# Patient Record
Sex: Male | Born: 1995
Health system: Southern US, Community
[De-identification: ages and names within clinical notes are randomized; demographics above are authoritative.]

## PROBLEM LIST (undated history)

## (undated) VITALS — BP 112/70 | HR 99 | Temp 97.8°F | Resp 16 | Ht 69.69 in | Wt 135.0 lb

## (undated) DIAGNOSIS — Z91148 Patient's other noncompliance with medication regimen for other reason: Secondary | ICD-10-CM

## (undated) DIAGNOSIS — B009 Herpesviral infection, unspecified: Secondary | ICD-10-CM

## (undated) DIAGNOSIS — J984 Other disorders of lung: Secondary | ICD-10-CM

## (undated) DIAGNOSIS — F909 Attention-deficit hyperactivity disorder, unspecified type: Secondary | ICD-10-CM

## (undated) DIAGNOSIS — E7800 Pure hypercholesterolemia, unspecified: Secondary | ICD-10-CM

## (undated) DIAGNOSIS — I10 Essential (primary) hypertension: Secondary | ICD-10-CM

## (undated) DIAGNOSIS — Z9114 Patient's other noncompliance with medication regimen: Secondary | ICD-10-CM

## (undated) DIAGNOSIS — E109 Type 1 diabetes mellitus without complications: Secondary | ICD-10-CM

## (undated) HISTORY — DX: Other disorders of lung: J98.4

## (undated) HISTORY — PX: OTHER SURGICAL HISTORY: SHX169

---
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
## 2013-02-05 ENCOUNTER — Other Ambulatory Visit: Payer: Self-pay

## 2013-02-05 MED ORDER — PEN NEEDLES 31G X 6 MM MISC: 1.0000 | Freq: Three times a day (TID) | Status: DC

## 2013-02-05 NOTE — Telephone Encounter (Signed)
Last seen 09/05/11  MMM  Last glucose in hospital 10/16/12

## 2013-02-05 NOTE — Telephone Encounter (Signed)
ntbs

## 2013-02-13 ENCOUNTER — Ambulatory Visit: Payer: Medicaid Other | Admitting: General Practice

## 2013-02-13 ENCOUNTER — Telehealth: Payer: Self-pay | Admitting: Nurse Practitioner

## 2013-02-13 NOTE — Telephone Encounter (Signed)
appt scheduled

## 2013-02-20 ENCOUNTER — Ambulatory Visit: Payer: Medicaid Other | Admitting: Nurse Practitioner

## 2013-03-10 ENCOUNTER — Emergency Department (HOSPITAL_COMMUNITY)
Admission: EM | Admit: 2013-03-10 | Discharge: 2013-03-10 | Disposition: A | Discharge status: Home / Self Care | Payer: Medicaid Other | Attending: Emergency Medicine | Admitting: Emergency Medicine

## 2013-03-10 ENCOUNTER — Encounter (HOSPITAL_COMMUNITY): Payer: Self-pay | Admitting: Emergency Medicine

## 2013-03-10 DIAGNOSIS — J020 Streptococcal pharyngitis: Secondary | ICD-10-CM

## 2013-03-10 DIAGNOSIS — R599 Enlarged lymph nodes, unspecified: Secondary | ICD-10-CM | POA: Insufficient documentation

## 2013-03-10 DIAGNOSIS — R3589 Other polyuria: Secondary | ICD-10-CM | POA: Insufficient documentation

## 2013-03-10 DIAGNOSIS — R6883 Chills (without fever): Secondary | ICD-10-CM | POA: Insufficient documentation

## 2013-03-10 DIAGNOSIS — E109 Type 1 diabetes mellitus without complications: Secondary | ICD-10-CM | POA: Insufficient documentation

## 2013-03-10 DIAGNOSIS — F17200 Nicotine dependence, unspecified, uncomplicated: Secondary | ICD-10-CM | POA: Insufficient documentation

## 2013-03-10 DIAGNOSIS — Z794 Long term (current) use of insulin: Secondary | ICD-10-CM | POA: Insufficient documentation

## 2013-03-10 DIAGNOSIS — R000 Tachycardia, unspecified: Secondary | ICD-10-CM | POA: Insufficient documentation

## 2013-03-10 DIAGNOSIS — R739 Hyperglycemia, unspecified: Secondary | ICD-10-CM

## 2013-03-10 DIAGNOSIS — R358 Other polyuria: Secondary | ICD-10-CM | POA: Insufficient documentation

## 2013-03-10 DIAGNOSIS — K089 Disorder of teeth and supporting structures, unspecified: Secondary | ICD-10-CM | POA: Insufficient documentation

## 2013-03-10 DIAGNOSIS — J029 Acute pharyngitis, unspecified: Secondary | ICD-10-CM | POA: Insufficient documentation

## 2013-03-10 DIAGNOSIS — Z8659 Personal history of other mental and behavioral disorders: Secondary | ICD-10-CM | POA: Insufficient documentation

## 2013-03-10 LAB — GLUCOSE, CAPILLARY: Glucose-Capillary: 337 mg/dL — ABNORMAL HIGH (ref 70–99)

## 2013-03-10 MED ORDER — OXYCODONE-ACETAMINOPHEN 5-325 MG PO TABS
1.0000 | ORAL_TABLET | Freq: Once | ORAL | Status: AC
  Administered 2013-03-10: 1 via ORAL
  Filled 2013-03-10: qty 1

## 2013-03-10 MED ORDER — HYDROCODONE-ACETAMINOPHEN 7.5-325 MG/15ML PO SOLN: 15.0000 mL | Freq: Four times a day (QID) | ORAL | Status: DC | PRN

## 2013-03-10 MED ORDER — AMOXICILLIN-POT CLAVULANATE 875-125 MG PO TABS: 1.0000 | ORAL_TABLET | Freq: Two times a day (BID) | ORAL | Status: DC

## 2013-03-10 NOTE — ED Notes (Signed)
Pt complaining of pain in his throat & neck. Pt states not here for blood sugars.

## 2013-03-10 NOTE — ED Notes (Signed)
States sore throat x 1 week. Also states at home CBG was 400's . Headache.

## 2013-03-10 NOTE — Discharge Instructions (Signed)
Hyperglycemia °Hyperglycemia occurs when the glucose (sugar) in your blood is too high. Hyperglycemia can happen for many reasons, but it most often happens to people who do not know they have diabetes or are not managing their diabetes properly.  °CAUSES  °Whether you have diabetes or not, there are other causes of hyperglycemia. Hyperglycemia can occur when you have diabetes, but it can also occur in other situations that you might not be as aware of, such as: °Diabetes °· If you have diabetes and are having problems controlling your blood glucose, hyperglycemia could occur because of some of the following reasons: °· Not following your meal plan. °· Not taking your diabetes medications or not taking it properly. °· Exercising less or doing less activity than you normally do. °· Being sick. °Pre-diabetes °· This cannot be ignored. Before people develop Type 2 diabetes, they almost always have "pre-diabetes." This is when your blood glucose levels are higher than normal, but not yet high enough to be diagnosed as diabetes. Research has shown that some long-term damage to the body, especially the heart and circulatory system, may already be occurring during pre-diabetes. If you take action to manage your blood glucose when you have pre-diabetes, you may delay or prevent Type 2 diabetes from developing. °Stress °· If you have diabetes, you may be "diet" controlled or on oral medications or insulin to control your diabetes. However, you may find that your blood glucose is higher than usual in the hospital whether you have diabetes or not. This is often referred to as "stress hyperglycemia." Stress can elevate your blood glucose. This happens because of hormones put out by the body during times of stress. If stress has been the cause of your high blood glucose, it can be followed regularly by your caregiver. That way he/she can make sure your hyperglycemia does not continue to get worse or progress to  diabetes. °Steroids °· Steroids are medications that act on the infection fighting system (immune system) to block inflammation or infection. One side effect can be a rise in blood glucose. Most people can produce enough extra insulin to allow for this rise, but for those who cannot, steroids make blood glucose levels go even higher. It is not unusual for steroid treatments to "uncover" diabetes that is developing. It is not always possible to determine if the hyperglycemia will go away after the steroids are stopped. A special blood test called an A1c is sometimes done to determine if your blood glucose was elevated before the steroids were started. °SYMPTOMS °· Thirsty. °· Frequent urination. °· Dry mouth. °· Blurred vision. °· Tired or fatigue. °· Weakness. °· Sleepy. °· Tingling in feet or leg. °DIAGNOSIS  °Diagnosis is made by monitoring blood glucose in one or all of the following ways: °· A1c test. This is a chemical found in your blood. °· Fingerstick blood glucose monitoring. °· Laboratory results. °TREATMENT  °First, knowing the cause of the hyperglycemia is important before the hyperglycemia can be treated. Treatment may include, but is not be limited to: °· Education. °· Change or adjustment in medications. °· Change or adjustment in meal plan. °· Treatment for an illness, infection, etc. °· More frequent blood glucose monitoring. °· Change in exercise plan. °· Decreasing or stopping steroids. °· Lifestyle changes. °HOME CARE INSTRUCTIONS  °· Test your blood glucose as directed. °· Exercise regularly. Your caregiver will give you instructions about exercise. Pre-diabetes or diabetes which comes on with stress is helped by exercising. °· Eat wholesome,   balanced meals. Eat often and at regular, fixed times. Your caregiver or nutritionist will give you a meal plan to guide your sugar intake.  Being at an ideal weight is important. If needed, losing as little as 10 to 15 pounds may help improve blood  glucose levels. SEEK MEDICAL CARE IF:   You have questions about medicine, activity, or diet.  You continue to have symptoms (problems such as increased thirst, urination, or weight gain). SEEK IMMEDIATE MEDICAL CARE IF:   You are vomiting or have diarrhea.  Your breath smells fruity.  You are breathing faster or slower.  You are very sleepy or incoherent.  You have numbness, tingling, or pain in your feet or hands.  You have chest pain.  Your symptoms get worse even though you have been following your caregiver's orders.  If you have any other questions or concerns. Document Released: 08/17/2000 Document Revised: 05/16/2011 Document Reviewed: 06/20/2011 [facility] Patient Information 2014 [place], [place].  Viral and Bacterial Pharyngitis Pharyngitis is soreness (inflammation) or infection of the pharynx. It is also called a sore throat. CAUSES  Most sore throats are caused by viruses and are part of a cold. However, some sore throats are caused by strep and other bacteria. Sore throats can also be caused by post nasal drip from draining sinuses, allergies and sometimes from sleeping with an open mouth. Infectious sore throats can be spread from person to person by coughing, sneezing and sharing cups or eating utensils. TREATMENT  Sore throats that are viral usually last 3-4 days. Viral illness will get better without medications (antibiotics). Strep throat and other bacterial infections will usually begin to get better about 24-48 hours after you begin to take antibiotics. HOME CARE INSTRUCTIONS   If the caregiver feels there is a bacterial infection or if there is a positive strep test, they will prescribe an antibiotic. The full course of antibiotics must be taken. If the full course of antibiotic is not taken, you or your child may become ill again. If you or your child has strep throat and do not finish all of the medication, serious heart or kidney diseases may develop.  Drink  enough water and fluids to keep your urine clear or pale yellow.  Only take over-the-counter or prescription medicines for pain, discomfort or fever as directed by your caregiver.  Get lots of rest.  Gargle with salt water ( tsp. of salt in a glass of water) as often as every 1-2 hours as you need for comfort.  Hard candies may soothe the throat if individual is not at risk for choking. Throat sprays or lozenges may also be used. SEEK MEDICAL CARE IF:   Large, tender lumps in the neck develop.  A rash develops.  Green, yellow-brown or bloody sputum is coughed up.  Your baby is older than 3 months with a rectal temperature of 100.5 F (38.1 C) or higher for more than 1 day. SEEK IMMEDIATE MEDICAL CARE IF:   A stiff neck develops.  You or your child are drooling or unable to swallow liquids.  You or your child are vomiting, unable to keep medications or liquids down.  You or your child has severe pain, unrelieved with recommended medications.  You or your child are having difficulty breathing (not due to stuffy nose).  You or your child are unable to fully open your mouth.  You or your child develop redness, swelling, or severe pain anywhere on the neck.  You have a fever.  Your  baby is older than 3 months with a rectal temperature of 102 F (38.9 C) or higher.  Your baby is 74 months old or younger with a rectal temperature of 100.4 F (38 C) or higher. MAKE SURE YOU:   Understand these instructions.  Will watch your condition.  Will get help right away if you are not doing well or get worse. Document Released: 02/21/2005 Document Revised: 05/16/2011 Document Reviewed: 05/21/2007 [facility] Patient Information 2014 [place], [place].

## 2013-03-10 NOTE — ED Provider Notes (Signed)
CSN: [id number]     Arrival date & time 03/10/13  1647 History   First MD Initiated Contact with Patient 03/10/13 1954     Chief Complaint  Patient presents with  . Sore Throat  . Hyperglycemia   (Consider location/radiation/quality/duration/timing/severity/associated sxs/prior Treatment) Patient is a 18 y.o. male presenting with pharyngitis and hyperglycemia. The history is provided by the patient.  Sore Throat Pertinent negatives include no chest pain.  Hyperglycemia Associated symptoms: polyuria   Associated symptoms: no chest pain and no fever    patient with sore throat for the last week. Worse with swallowing. Worse on the right side. Also states he has a bad tooth. No fevers. No difficulty breathing. He has had some chills. His sugars have been elevated to 400. She's had nausea and some vomiting. No diarrhea. No cough. He is swelling of his neck.  Past Medical History  Diagnosis Date  . ADHD (attention deficit hyperactivity disorder)   . DM type 1 (diabetes mellitus, type 1)     diagnosed age 71   Past Surgical History  Procedure Laterality Date  . None     No family history on file. History  Substance Use Topics  . Smoking status: Current Every Day Smoker -- 1.00 packs/day for 2 years    Types: Cigarettes  . Smokeless tobacco: Not on file  . Alcohol Use: Yes     Comment: occassional    Review of Systems  Constitutional: Positive for chills. Negative for fever and appetite change.  HENT: Positive for dental problem and sore throat. Negative for congestion, postnasal drip and trouble swallowing.   Respiratory: Negative for chest tightness.   Cardiovascular: Negative for chest pain.  Endocrine: Positive for polyuria.  Genitourinary: Negative for frequency and flank pain.  Musculoskeletal: Negative for back pain.  Skin: Negative for pallor and wound.  Hematological: Positive for adenopathy.    Allergies  Sulfa antibiotics  Home Medications   Current Outpatient  Rx  Name  Route  Sig  Dispense  Refill  . insulin aspart (NOVOLOG) 100 UNIT/ML injection   Subcutaneous   Inject 12-20 Units into the skin 3 (three) times daily before meals. Sliding scale as directed. Pt takes anywhere from 12 to 20 units.   1 vial   3   . insulin glargine (LANTUS) 100 UNIT/ML injection   Subcutaneous   Inject 45 Units into the skin at bedtime.         . Insulin Pen Needle (PEN NEEDLES) 31G X 6 MM MISC   Does not apply   1 Stick by Does not apply route 3 (three) times daily.   200 each   0    BP 116/75  Pulse 109  Temp(Src) 97.8 F (36.6 C) (Oral)  Resp 24  Ht 5\' 11"  (1.803 m)  Wt 140 lb (63.504 kg)  BMI 19.53 kg/m2  SpO2 100% Physical Exam  Constitutional: He is oriented to person, place, and time. He appears well-developed and well-nourished.  HENT:  Head: Normocephalic.  Posterior pharyngeal tonsillar swelling with exudate. Uvula midline. The fourth tooth from the midline on the right lower jaw has a large cavity. There is tenderness here without clear fluctuance around it. There is anterior cervical lymphadenopathy, worse on the right submandibular area.  Eyes: Pupils are equal, round, and reactive to light.  Neck: Normal range of motion. Neck supple.  Cardiovascular:  Mild tachycardia  Pulmonary/Chest: Effort normal. No respiratory distress.  Abdominal: Soft. There is no tenderness.  Neurological: He  is alert and oriented to person, place, and time.  Skin: Skin is warm.    ED Course  Procedures (including critical care time) Labs Review Labs Reviewed  GLUCOSE, CAPILLARY - Abnormal; Notable for the following:    Glucose-Capillary 337 (*)    All other components within normal limits   Imaging Review No results found.  EKG Interpretation   None       MDM   1. Strep throat   2. Hyperglycemia    Patient with sore throat hyperglycemia. Also bad tooth with pain. We'll treat with antibiotics to cover both throat and dental. Patient is  hyperglycemic, but was not one further blood work or IV fluids. He states he does take insulin at home. He'll be discharged home with pain meds and antibiotics.    Jasper Riling. Alvino Chapel, MD 03/10/13 2021

## 2013-03-10 NOTE — ED Notes (Signed)
Pt alert & oriented x4, stable gait. Patient  given discharge instructions, paperwork & prescription(s). Patient instructed to stop at the registration desk to finish any additional paperwork. Patient verbalized understanding. Pt asked if EDP would change script from liquid pain medication to pill form & pt was advised EDP wrote it for liquid & would not change.  Pt left department w/ no further questions.

## 2013-04-05 ENCOUNTER — Other Ambulatory Visit: Payer: Self-pay | Admitting: *Deleted

## 2013-04-05 MED ORDER — ALBUTEROL SULFATE HFA 108 (90 BASE) MCG/ACT IN AERS: 2.0000 | INHALATION_SPRAY | Freq: Four times a day (QID) | RESPIRATORY_TRACT | Status: DC | PRN

## 2013-04-05 MED ORDER — OMEPRAZOLE 40 MG PO CPDR: 40.0000 mg | DELAYED_RELEASE_CAPSULE | Freq: Every day | ORAL | Status: DC

## 2013-04-05 NOTE — Telephone Encounter (Signed)
Received rx refill request for these two meds. Neither were on current med list. Please advise

## 2013-04-15 ENCOUNTER — Telehealth: Payer: Self-pay | Admitting: Family Medicine

## 2013-05-07 ENCOUNTER — Other Ambulatory Visit: Payer: Self-pay | Admitting: Nurse Practitioner

## 2013-05-27 ENCOUNTER — Ambulatory Visit: Payer: Medicaid Other | Admitting: Family Medicine

## 2013-05-28 ENCOUNTER — Other Ambulatory Visit: Payer: Self-pay | Admitting: Nurse Practitioner

## 2013-05-29 NOTE — Telephone Encounter (Signed)
No visit in [place]. Last visit per paper chart was 09-05-11. Please advise

## 2013-05-29 NOTE — Telephone Encounter (Signed)
Patient NTBS for follow up and lab work for future refills  

## 2013-08-08 ENCOUNTER — Telehealth: Payer: Self-pay | Admitting: Nurse Practitioner

## 2013-08-08 NOTE — Telephone Encounter (Signed)
appt given for 7/2 with mmm

## 2013-09-05 ENCOUNTER — Ambulatory Visit: Payer: Medicaid Other | Admitting: Nurse Practitioner

## 2013-09-16 ENCOUNTER — Telehealth: Payer: Self-pay | Admitting: Nurse Practitioner

## 2013-09-16 NOTE — Telephone Encounter (Signed)
Patient was offered appt for today but was unable to come and patient also had appt on 7/2 with mmm and no showed

## 2013-09-16 NOTE — Telephone Encounter (Signed)
appt scheduled for 7/22 with mmm

## 2013-09-25 ENCOUNTER — Ambulatory Visit: Payer: Medicaid Other | Admitting: Nurse Practitioner

## 2013-10-04 ENCOUNTER — Emergency Department (HOSPITAL_COMMUNITY): Payer: Medicaid Other

## 2013-10-04 ENCOUNTER — Inpatient Hospital Stay (HOSPITAL_COMMUNITY)
Admission: EM | Admit: 2013-10-04 | Discharge: 2013-10-05 | DRG: 638 | Disposition: A | Discharge status: Home / Self Care | Payer: Medicaid Other | Attending: Internal Medicine | Admitting: Internal Medicine

## 2013-10-04 ENCOUNTER — Encounter (HOSPITAL_COMMUNITY): Payer: Self-pay | Admitting: Emergency Medicine

## 2013-10-04 DIAGNOSIS — E871 Hypo-osmolality and hyponatremia: Secondary | ICD-10-CM | POA: Diagnosis present

## 2013-10-04 DIAGNOSIS — F17200 Nicotine dependence, unspecified, uncomplicated: Secondary | ICD-10-CM | POA: Diagnosis present

## 2013-10-04 DIAGNOSIS — Z794 Long term (current) use of insulin: Secondary | ICD-10-CM

## 2013-10-04 DIAGNOSIS — F1210 Cannabis abuse, uncomplicated: Secondary | ICD-10-CM | POA: Diagnosis present

## 2013-10-04 DIAGNOSIS — E1110 Type 2 diabetes mellitus with ketoacidosis without coma: Secondary | ICD-10-CM | POA: Diagnosis present

## 2013-10-04 DIAGNOSIS — F909 Attention-deficit hyperactivity disorder, unspecified type: Secondary | ICD-10-CM | POA: Diagnosis present

## 2013-10-04 DIAGNOSIS — Z9119 Patient's noncompliance with other medical treatment and regimen: Secondary | ICD-10-CM

## 2013-10-04 DIAGNOSIS — E1010 Type 1 diabetes mellitus with ketoacidosis without coma: Principal | ICD-10-CM | POA: Diagnosis present

## 2013-10-04 DIAGNOSIS — E860 Dehydration: Secondary | ICD-10-CM

## 2013-10-04 DIAGNOSIS — Z91199 Patient's noncompliance with other medical treatment and regimen due to unspecified reason: Secondary | ICD-10-CM

## 2013-10-04 LAB — CBC WITH DIFFERENTIAL/PLATELET
BASOS PCT: 0 % (ref 0–1)
Basophils Absolute: 0 10*3/uL (ref 0.0–0.1)
Eosinophils Absolute: 0 10*3/uL (ref 0.0–0.7)
Eosinophils Relative: 0 % (ref 0–5)
HEMATOCRIT: 50.2 % (ref 39.0–52.0)
HEMOGLOBIN: 17.4 g/dL — AB (ref 13.0–17.0)
LYMPHS ABS: 2.5 10*3/uL (ref 0.7–4.0)
LYMPHS PCT: 24 % (ref 12–46)
MCH: 31.5 pg (ref 26.0–34.0)
MCHC: 34.7 g/dL (ref 30.0–36.0)
MCV: 90.8 fL (ref 78.0–100.0)
MONO ABS: 0.4 10*3/uL (ref 0.1–1.0)
MONOS PCT: 4 % (ref 3–12)
NEUTROS ABS: 7.4 10*3/uL (ref 1.7–7.7)
NEUTROS PCT: 72 % (ref 43–77)
Platelets: 248 10*3/uL (ref 150–400)
RBC: 5.53 MIL/uL (ref 4.22–5.81)
RDW: 12.4 % (ref 11.5–15.5)
WBC: 10.4 10*3/uL (ref 4.0–10.5)

## 2013-10-04 LAB — CBG MONITORING, ED

## 2013-10-04 MED ORDER — SODIUM CHLORIDE 0.9 % IV SOLN
1000.0000 mL | Freq: Once | INTRAVENOUS | Status: AC
  Administered 2013-10-04: 1000 mL via INTRAVENOUS

## 2013-10-04 MED ORDER — SODIUM CHLORIDE 0.9 % IV SOLN: 1000.0000 mL | INTRAVENOUS | Status: DC

## 2013-10-04 MED ORDER — INSULIN ASPART 100 UNIT/ML ~~LOC~~ SOLN
10.0000 [IU] | Freq: Once | SUBCUTANEOUS | Status: AC
  Administered 2013-10-04: 10 [IU] via INTRAVENOUS
  Filled 2013-10-04: qty 1

## 2013-10-04 NOTE — ED Provider Notes (Addendum)
CSN: [id number]     Arrival date & time 10/04/13  2237 History   First MD Initiated Contact with Patient 10/04/13 2312    This chart was scribed for Delora Fuel, MD by Terressa Koyanagi, ED Scribe. This patient was seen in room APA05/APA05 and the patient's care was started at 11:15 PM.  Chief Complaint  Patient presents with  . Hyperglycemia  PCP: Chevis Pretty, FNP  HPI HPI Comments: BRANDT CHANEY is a 18 y.o. male, with a history of DMT1, who presents to the Emergency Department complaining of generalized pain to his chest, abd, feet, shoulder onset 3 days ago. Pt also states that he is "dehydrated." Pt denies fever, sweats, or chills. Pt reports that he has not been taking his DM meds recently due to problems with medicaid and inability to cover the cost of his meds. Pt denies any other health problems. Pt is a current everyday smoker who smokes 1 ppd.   Past Medical History  Diagnosis Date  . ADHD (attention deficit hyperactivity disorder)   . DM type 1 (diabetes mellitus, type 1)     diagnosed age 74   Past Surgical History  Procedure Laterality Date  . None     No family history on file. History  Substance Use Topics  . Smoking status: Current Every Day Smoker -- 1.00 packs/day for 2 years    Types: Cigarettes  . Smokeless tobacco: Not on file  . Alcohol Use: Yes     Comment: occassional    Review of Systems  Constitutional: Negative for fever and chills.  Cardiovascular: Positive for chest pain.  Gastrointestinal: Positive for abdominal pain.  Musculoskeletal:       Shoulder and feet pain   All other systems reviewed and are negative.  Allergies  Sulfa antibiotics  Home Medications   Prior to Admission medications   Medication Sig Start Date End Date Taking? Authorizing Provider  insulin aspart (NOVOLOG) 100 UNIT/ML injection Inject 12-20 Units into the skin 3 (three) times daily before meals. Sliding scale as directed. Pt takes anywhere from 12 to 20  units. 10/31/11  Yes Kandis Nab, MD  insulin glargine (LANTUS) 100 UNIT/ML injection Inject 45 Units into the skin at bedtime.   Yes Historical Provider, MD  albuterol (PROVENTIL HFA;VENTOLIN HFA) 108 (90 BASE) MCG/ACT inhaler Inhale 2 puffs into the lungs every 6 (six) hours as needed for wheezing or shortness of breath. 04/05/13   Mary-Margaret Hassell Done, FNP  amoxicillin-clavulanate (AUGMENTIN) 875-125 MG per tablet Take 1 tablet by mouth 2 (two) times daily. 03/10/13   Jasper Riling. Pickering, MD  HYDROcodone-acetaminophen (HYCET) 7.5-325 mg/15 ml solution Take 15 mLs by mouth every 6 (six) hours as needed for moderate pain. 03/10/13   Jasper Riling. Pickering, MD  Insulin Pen Needle (PEN NEEDLES) 31G X 6 MM MISC CHECK BLOOD SUGAR 3 TIMES A DAY    Mary-Margaret Hassell Done, FNP  omeprazole (PRILOSEC) 40 MG capsule Take 1 capsule (40 mg total) by mouth daily. 04/05/13   Mary-Margaret Hassell Done, FNP   Triage Vitals: BP 129/78  Pulse 107  Temp(Src) 98.1 F (36.7 C) (Oral)  Resp 18  Ht 5\' 11"  (1.803 m)  Wt 130 lb (58.968 kg)  BMI 18.14 kg/m2  SpO2 100% Physical Exam  Nursing note and vitals reviewed. Constitutional: He is oriented to person, place, and time. He appears well-developed and well-nourished. No distress.  Appears uncomfortable. Mildly decipnic.   HENT:  Head: Normocephalic and atraumatic.  Mucous membranes dry  Eyes:  Conjunctivae and EOM are normal. Pupils are equal, round, and reactive to light.  Neck: Normal range of motion. Neck supple. No JVD present. No tracheal deviation present.  Cardiovascular: Normal rate, regular rhythm and normal heart sounds.   No murmur heard. Pulmonary/Chest: Effort normal and breath sounds normal. No respiratory distress. He has no wheezes. He has no rales.  Abdominal: Soft. He exhibits no distension and no mass. There is no tenderness.  Bowel sounds are decreased.  Musculoskeletal: Normal range of motion. He exhibits no edema.  Lymphadenopathy:    He has no  cervical adenopathy.  Neurological: He is alert and oriented to person, place, and time. He has normal reflexes. No cranial nerve deficit. Coordination normal.  Skin: Skin is warm and dry. No rash noted.  Psychiatric: He has a normal mood and affect. His behavior is normal.    ED Course  Procedures (including critical care time) DIAGNOSTIC STUDIES: Oxygen Saturation is 100% on RA, nl by my interpretation.    COORDINATION OF CARE: 11:20 PM-Discussed treatment plan which includes IV fluids, labs, meds (including insulin) with pt at bedside and pt agreed to plan.   Labs Review Results for orders placed during the hospital encounter of 10/04/13  CBC WITH DIFFERENTIAL      Result Value Ref Range   WBC 10.4  4.0 - 10.5 K/uL   RBC 5.53  4.22 - 5.81 MIL/uL   Hemoglobin 17.4 (*) 13.0 - 17.0 g/dL   HCT 50.2  39.0 - 52.0 %   MCV 90.8  78.0 - 100.0 fL   MCH 31.5  26.0 - 34.0 pg   MCHC 34.7  30.0 - 36.0 g/dL   RDW 12.4  11.5 - 15.5 %   Platelets 248  150 - 400 K/uL   Neutrophils Relative % 72  43 - 77 %   Neutro Abs 7.4  1.7 - 7.7 K/uL   Lymphocytes Relative 24  12 - 46 %   Lymphs Abs 2.5  0.7 - 4.0 K/uL   Monocytes Relative 4  3 - 12 %   Monocytes Absolute 0.4  0.1 - 1.0 K/uL   Eosinophils Relative 0  0 - 5 %   Eosinophils Absolute 0.0  0.0 - 0.7 K/uL   Basophils Relative 0  0 - 1 %   Basophils Absolute 0.0  0.0 - 0.1 K/uL  COMPREHENSIVE METABOLIC PANEL      Result Value Ref Range   Sodium 130 (*) 137 - 147 mEq/L   Potassium 5.2  3.7 - 5.3 mEq/L   Chloride 88 (*) 96 - 112 mEq/L   CO2 15 (*) 19 - 32 mEq/L   Glucose, Bld 680 (*) 70 - 99 mg/dL   BUN 20  6 - 23 mg/dL   Creatinine, Ser 0.94  0.50 - 1.35 mg/dL   Calcium 9.5  8.4 - 10.5 mg/dL   Total Protein 7.6  6.0 - 8.3 g/dL   Albumin 4.5  3.5 - 5.2 g/dL   AST 9  0 - 37 U/L   ALT 9  0 - 53 U/L   Alkaline Phosphatase 101  39 - 117 U/L   Total Bilirubin 2.1 (*) 0.3 - 1.2 mg/dL   GFR calc non Af Amer >90  >90 mL/min   GFR calc Af  Amer >90  >90 mL/min   Anion gap 27 (*) 5 - 15  LIPASE, BLOOD      Result Value Ref Range   Lipase 13  11 - 59  U/L  LACTIC ACID, PLASMA      Result Value Ref Range   Lactic Acid, Venous 3.1 (*) 0.5 - 2.2 mmol/L  URINALYSIS, ROUTINE W REFLEX MICROSCOPIC      Result Value Ref Range   Color, Urine YELLOW  YELLOW   APPearance CLEAR  CLEAR   Specific Gravity, Urine 1.010  1.005 - 1.030   pH 6.0  5.0 - 8.0   Glucose, UA >1000 (*) NEGATIVE mg/dL   Hgb urine dipstick NEGATIVE  NEGATIVE   Bilirubin Urine NEGATIVE  NEGATIVE   Ketones, ur >80 (*) NEGATIVE mg/dL   Protein, ur NEGATIVE  NEGATIVE mg/dL   Urobilinogen, UA 0.2  0.0 - 1.0 mg/dL   Nitrite NEGATIVE  NEGATIVE   Leukocytes, UA NEGATIVE  NEGATIVE  URINE MICROSCOPIC-ADD ON      Result Value Ref Range   Squamous Epithelial / LPF RARE  RARE   WBC, UA 0-2  <3 WBC/hpf   RBC / HPF 0-2  <3 RBC/hpf   Bacteria, UA RARE  RARE  CBG MONITORING, ED      Result Value Ref Range   Glucose-Capillary >600 (*) 70 - 99 mg/dL  CBG MONITORING, ED      Result Value Ref Range   Glucose-Capillary 404 (*) 70 - 99 mg/dL   Imaging Review Dg Chest Port 1 View  10/04/2013   CLINICAL DATA:  Hyperglycemia. Generalized chest pain. Onset 3 days ago. Smoker.  EXAM: PORTABLE CHEST - 1 VIEW  COMPARISON:  10/15/2012  FINDINGS: The heart size and mediastinal contours are within normal limits. Both lungs are clear. The visualized skeletal structures are unremarkable.  IMPRESSION: No active disease.   Electronically Signed   By: Lucienne Capers M.D.   On: 10/04/2013 23:46   CRITICAL CARE Performed by: VQMGQ,[email or web address] Total critical care time: 75 minutes Critical care time was exclusive of separately billable procedures and treating other patients. Critical care was necessary to treat or prevent imminent or life-threatening deterioration. Critical care was time spent personally by me on the following activities: development of treatment plan with patient and/or surrogate  as well as nursing, discussions with consultants, evaluation of patient's response to treatment, examination of patient, obtaining history from patient or surrogate, ordering and performing treatments and interventions, ordering and review of laboratory studies, ordering and review of radiographic studies, pulse oximetry and re-evaluation of patient's condition.  MDM   Final diagnoses:  Diabetic ketoacidosis without coma associated with type 1 diabetes mellitus    Hypoglycemia in patient with type 1 diabetes and medication noncompliance. Old records are reviewed and he has been admitted for ketoacidosis in the past. I'm very concerned that he has in the early stages of ketoacidosis. He is given a dose of insulin and given IV fluids.  Laboratory workup confirms presence of ketoacidosis. There is mild elevation of lactic acid to 3.1 but serum CO2 is down to 15 and anion gap is 27. Case is discussed with Dr. Darrick Meigs who agrees to admit the patient to step down unit. However, when I went to inform the patient that he was going to be admitted, he stated that he was not going to stay. I discussed with him the risks of untreated diabetic ketoacidosis and inability to treat her with just insulin as an outpatient but he is adamant that he will not stay. Currently, family members are trying to convince him to stay.  The patient has agreed to stay for her hospitalization. Dr. Darrick Meigs is contacted and will come  and evaluate the patient.  I personally performed the services described in this documentation, which was scribed in my presence. The recorded information has been reviewed and is accurate.     Delora Fuel, MD 35/70/17 7939  Fatuma Dowers, MD 03/00/92 3300

## 2013-10-04 NOTE — ED Notes (Signed)
Pt has not been able to take his regular medication due to being without medicaid.  Pt states that he has been feeling sick, dehydrated for 3 days.  Pt has pain in abdomen, feet, shoulder.  Pt is alert and oriented

## 2013-10-05 ENCOUNTER — Encounter (HOSPITAL_COMMUNITY): Payer: Self-pay | Admitting: *Deleted

## 2013-10-05 DIAGNOSIS — F909 Attention-deficit hyperactivity disorder, unspecified type: Secondary | ICD-10-CM | POA: Diagnosis present

## 2013-10-05 DIAGNOSIS — E1010 Type 1 diabetes mellitus with ketoacidosis without coma: Secondary | ICD-10-CM | POA: Diagnosis present

## 2013-10-05 DIAGNOSIS — E871 Hypo-osmolality and hyponatremia: Secondary | ICD-10-CM | POA: Diagnosis present

## 2013-10-05 DIAGNOSIS — Z91199 Patient's noncompliance with other medical treatment and regimen due to unspecified reason: Secondary | ICD-10-CM | POA: Diagnosis not present

## 2013-10-05 DIAGNOSIS — Z9119 Patient's noncompliance with other medical treatment and regimen: Secondary | ICD-10-CM

## 2013-10-05 DIAGNOSIS — E1110 Type 2 diabetes mellitus with ketoacidosis without coma: Secondary | ICD-10-CM | POA: Diagnosis present

## 2013-10-05 DIAGNOSIS — E860 Dehydration: Secondary | ICD-10-CM

## 2013-10-05 DIAGNOSIS — Z794 Long term (current) use of insulin: Secondary | ICD-10-CM | POA: Diagnosis not present

## 2013-10-05 DIAGNOSIS — F1210 Cannabis abuse, uncomplicated: Secondary | ICD-10-CM | POA: Diagnosis present

## 2013-10-05 DIAGNOSIS — R112 Nausea with vomiting, unspecified: Secondary | ICD-10-CM | POA: Diagnosis present

## 2013-10-05 DIAGNOSIS — F17200 Nicotine dependence, unspecified, uncomplicated: Secondary | ICD-10-CM | POA: Diagnosis present

## 2013-10-05 HISTORY — DX: Type 2 diabetes mellitus with ketoacidosis without coma: E11.10

## 2013-10-05 LAB — BASIC METABOLIC PANEL
ANION GAP: 10 (ref 5–15)
Anion gap: 17 — ABNORMAL HIGH (ref 5–15)
Anion gap: 11 (ref 5–15)
BUN: 19 mg/dL (ref 6–23)
BUN: 18 mg/dL (ref 6–23)
BUN: 17 mg/dL (ref 6–23)
CALCIUM: 8.3 mg/dL — AB (ref 8.4–10.5)
CALCIUM: 8.4 mg/dL (ref 8.4–10.5)
CHLORIDE: 98 meq/L (ref 96–112)
CHLORIDE: 105 meq/L (ref 96–112)
CO2: 19 mEq/L (ref 19–32)
CO2: 22 meq/L (ref 19–32)
CO2: 24 meq/L (ref 19–32)
CREATININE: 0.83 mg/dL (ref 0.50–1.35)
CREATININE: 0.68 mg/dL (ref 0.50–1.35)
CREATININE: 0.75 mg/dL (ref 0.50–1.35)
Calcium: 8.3 mg/dL — ABNORMAL LOW (ref 8.4–10.5)
Chloride: 105 mEq/L (ref 96–112)
GFR calc Af Amer: >90 mL/min (ref >90)
GFR calc Af Amer: >90 mL/min (ref >90)
GFR calc Af Amer: >90 mL/min (ref >90)
GFR calc non Af Amer: >90 mL/min (ref >90)
GFR calc non Af Amer: >90 mL/min (ref >90)
GFR calc non Af Amer: >90 mL/min (ref >90)
GLUCOSE: 104 mg/dL — AB (ref 70–99)
Glucose, Bld: 390 mg/dL — ABNORMAL HIGH (ref 70–99)
Glucose, Bld: 185 mg/dL — ABNORMAL HIGH (ref 70–99)
Potassium: 4.7 mEq/L (ref 3.7–5.3)
Potassium: 3.7 mEq/L (ref 3.7–5.3)
Potassium: 4 mEq/L (ref 3.7–5.3)
Sodium: 134 mEq/L — ABNORMAL LOW (ref 137–147)
Sodium: 138 mEq/L (ref 137–147)
Sodium: 139 mEq/L (ref 137–147)

## 2013-10-05 LAB — COMPREHENSIVE METABOLIC PANEL
ALBUMIN: 4.5 g/dL (ref 3.5–5.2)
ALK PHOS: 101 U/L (ref 39–117)
ALT: 9 U/L (ref 0–53)
ANION GAP: 27 — AB (ref 5–15)
AST: 9 U/L (ref 0–37)
BILIRUBIN TOTAL: 2.1 mg/dL — AB (ref 0.3–1.2)
BUN: 20 mg/dL (ref 6–23)
CHLORIDE: 88 meq/L — AB (ref 96–112)
CO2: 15 meq/L — AB (ref 19–32)
CREATININE: 0.94 mg/dL (ref 0.50–1.35)
Calcium: 9.5 mg/dL (ref 8.4–10.5)
GFR calc Af Amer: >90 mL/min (ref >90)
GLUCOSE: 680 mg/dL — AB (ref 70–99)
POTASSIUM: 5.2 meq/L (ref 3.7–5.3)
Sodium: 130 mEq/L — ABNORMAL LOW (ref 137–147)
Total Protein: 7.6 g/dL (ref 6.0–8.3)

## 2013-10-05 LAB — LIPASE, BLOOD: LIPASE: 13 U/L (ref 11–59)

## 2013-10-05 LAB — GLUCOSE, CAPILLARY
GLUCOSE-CAPILLARY: 240 mg/dL — AB (ref 70–99)
GLUCOSE-CAPILLARY: 158 mg/dL — AB (ref 70–99)
GLUCOSE-CAPILLARY: 137 mg/dL — AB (ref 70–99)
Glucose-Capillary: 401 mg/dL — ABNORMAL HIGH (ref 70–99)
Glucose-Capillary: 286 mg/dL — ABNORMAL HIGH (ref 70–99)
Glucose-Capillary: 188 mg/dL — ABNORMAL HIGH (ref 70–99)
Glucose-Capillary: 91 mg/dL (ref 70–99)
Glucose-Capillary: 86 mg/dL (ref 70–99)
Glucose-Capillary: 138 mg/dL — ABNORMAL HIGH (ref 70–99)
Glucose-Capillary: 206 mg/dL — ABNORMAL HIGH (ref 70–99)

## 2013-10-05 LAB — MRSA PCR SCREENING: MRSA BY PCR: NEGATIVE

## 2013-10-05 LAB — URINALYSIS, ROUTINE W REFLEX MICROSCOPIC
BILIRUBIN URINE: NEGATIVE
HGB URINE DIPSTICK: NEGATIVE
Ketones, ur: >80 mg/dL — AB
Leukocytes, UA: NEGATIVE
Nitrite: NEGATIVE
PROTEIN: NEGATIVE mg/dL
Specific Gravity, Urine: 1.01 (ref 1.005–1.030)
UROBILINOGEN UA: 0.2 mg/dL (ref 0.0–1.0)
pH: 6 (ref 5.0–8.0)

## 2013-10-05 LAB — CBG MONITORING, ED: GLUCOSE-CAPILLARY: 404 mg/dL — AB (ref 70–99)

## 2013-10-05 LAB — URINE MICROSCOPIC-ADD ON

## 2013-10-05 LAB — LACTIC ACID, PLASMA: Lactic Acid, Venous: 3.1 mmol/L — ABNORMAL HIGH (ref 0.5–2.2)

## 2013-10-05 MED ORDER — NICOTINE 21 MG/24HR TD PT24: 21.0000 mg | MEDICATED_PATCH | Freq: Every day | TRANSDERMAL | Status: DC

## 2013-10-05 MED ORDER — INSULIN GLARGINE 100 UNIT/ML SOLOSTAR PEN: 46.0000 [IU] | PEN_INJECTOR | Freq: Every day | SUBCUTANEOUS | Status: DC

## 2013-10-05 MED ORDER — DEXTROSE 50 % IV SOLN: 25.0000 mL | INTRAVENOUS | Status: DC | PRN

## 2013-10-05 MED ORDER — INSULIN ASPART 100 UNIT/ML ~~LOC~~ SOLN: 5.0000 [IU] | Freq: Three times a day (TID) | SUBCUTANEOUS | Status: DC

## 2013-10-05 MED ORDER — SODIUM CHLORIDE 0.9 % IV SOLN: INTRAVENOUS | Status: DC

## 2013-10-05 MED ORDER — SODIUM CHLORIDE 0.9 % IV SOLN
INTRAVENOUS | Status: DC
  Administered 2013-10-05: 03:00:00 via INTRAVENOUS

## 2013-10-05 MED ORDER — INSULIN GLARGINE 100 UNIT/ML ~~LOC~~ SOLN
40.0000 [IU] | Freq: Every day | SUBCUTANEOUS | Status: DC
  Administered 2013-10-05: 40 [IU] via SUBCUTANEOUS
  Filled 2013-10-05 (×3): qty 0.4

## 2013-10-05 MED ORDER — HYDROCODONE-ACETAMINOPHEN 5-325 MG PO TABS
1.0000 | ORAL_TABLET | Freq: Four times a day (QID) | ORAL | Status: DC | PRN
  Administered 2013-10-05 (×2): 1 via ORAL
  Filled 2013-10-05 (×2): qty 1

## 2013-10-05 MED ORDER — SODIUM CHLORIDE 0.9 % IV SOLN
INTRAVENOUS | Status: DC
  Administered 2013-10-05: 03:00:00 via INTRAVENOUS
  Filled 2013-10-05: qty 1

## 2013-10-05 MED ORDER — DEXTROSE-NACL 5-0.45 % IV SOLN
INTRAVENOUS | Status: DC
  Administered 2013-10-05: 05:00:00 via INTRAVENOUS

## 2013-10-05 MED ORDER — SODIUM CHLORIDE 0.9 % IV SOLN
INTRAVENOUS | Status: DC
  Filled 2013-10-05: qty 1

## 2013-10-05 MED ORDER — BLOOD GLUCOSE MONITORING SUPPL SUPPLIES MISC: Status: DC

## 2013-10-05 MED ORDER — ENOXAPARIN SODIUM 40 MG/0.4ML ~~LOC~~ SOLN
40.0000 mg | SUBCUTANEOUS | Status: DC
Start: 2013-10-05 — End: 2013-10-05

## 2013-10-05 MED ORDER — INSULIN ASPART 100 UNIT/ML FLEXPEN: 5.0000 [IU] | PEN_INJECTOR | Freq: Three times a day (TID) | SUBCUTANEOUS | Status: DC

## 2013-10-05 MED ORDER — INSULIN GLARGINE 100 UNIT/ML ~~LOC~~ SOLN: 46.0000 [IU] | Freq: Every day | SUBCUTANEOUS | Status: DC

## 2013-10-05 MED ORDER — INSULIN ASPART 100 UNIT/ML ~~LOC~~ SOLN
0.0000 [IU] | Freq: Three times a day (TID) | SUBCUTANEOUS | Status: DC
  Administered 2013-10-05: 5 [IU] via SUBCUTANEOUS

## 2013-10-05 MED ORDER — ONDANSETRON HCL 4 MG/2ML IJ SOLN: 4.0000 mg | Freq: Three times a day (TID) | INTRAMUSCULAR | Status: DC | PRN

## 2013-10-05 NOTE — Progress Notes (Signed)
Patient slept until approx 10am. Ate breakfast and then became very anxious for discharge to home. Patient states he will stay at hospital until lunch time and then the MD can discharge him or he will leave AMA. MD notified. MD wanted to monitor blood sugars until lunch, and if stable he will discharge patient. Noon blood sugar reading was 206, MD notified. Md ordered discharge. This nurse reviewed discharge instructions with patient and patient family. Reviewed when next dose due. Patient voiced understanding. Prescriptions given for Lantus solostar pen, Novolog flex pen, and testing and pen needle supplies.  Discharge instructions signed, client d/c to home with mother. Patient refuses for nurse to escort client to vehicle.

## 2013-10-05 NOTE — Discharge Instructions (Signed)
Diabetic Ketoacidosis °Diabetic ketoacidosis (DKA) is a life-threatening complication of type 1 diabetes. It must be quickly recognized and treated. Treatment requires hospitalization. °CAUSES  °When there is no insulin in the body, glucose (sugar) cannot be used, and the body breaks down fat for energy. When fat breaks down, acids (ketones) build up in the blood. Very high levels of glucose and high levels of acids lead to severe loss of body fluids (dehydration) and other dangerous chemical changes. This stresses your vital organs and can cause coma or death. °SIGNS AND SYMPTOMS  °· Tiredness (fatigue). °· Weight loss. °· Excessive thirst. °· Ketones in your urine. °· Light-headedness. °· Fruity or sweet smelling breath. °· Excessive urination. °· Visual changes. °· Confusion or irritability. °· Nausea or vomiting. °· Rapid breathing. °· Stomachache or abdominal pain. °DIAGNOSIS  °Your health care provider will diagnose DKA based on your history, physical exam, and blood tests. The health care provider will check to see if you have another illness that caused you to go into DKA. Most of this will be done quickly in an emergency room. °TREATMENT  °· Fluid replacement to correct dehydration. °· Insulin. °· Correction of electrolytes, such as potassium and sodium. °· Antibiotic medicines. °PREVENTION °· Always take your insulin. Do not skip your insulin injections. °· If you are sick, treat yourself quickly. Your body often needs more insulin to fight the illness. °· Check your blood glucose regularly. °· Check urine ketones if your blood glucose is greater than 240 milligrams per deciliter (mg/dL). °· Do not use outdated (expired) insulin. °· If your blood glucose is high, drink plenty of fluids. This helps flush out ketones. °HOME CARE INSTRUCTIONS  °· If you are sick, follow the advice of your health care provider. °· To prevent dehydration, drink enough water and fluids to keep your urine clear or pale  yellow. °¨ If you cannot eat, alternate between drinking fluids with sugar (soda, juices, flavored gelatin) and salty fluids (broth, bouillon). °¨ If you can eat, follow your usual diet and drink sugar-free liquids (water, diet drinks). °· Always take your usual dose of insulin. If you cannot eat or if your glucose is getting too low, call your health care provider for further instructions. °· Continue to monitor your blood or urine ketones every 3-4 hours around the clock. Set your alarm clock or have someone wake you up. If you are too sick, have someone test it for you. °· Rest and avoid exercise. °SEEK MEDICAL CARE IF:  °· You have a fever. °· You have ketones in your urine, or your blood glucose is higher than a level your health care provider suggests. You may need extra insulin. Call your health care provider if you need advice on adjusting your insulin. °· You cannot drink at least a tablespoon (15 mL) of fluid every 15-20 minutes. °· You have been vomiting for more than 2 hours. °· You have symptoms of DKA: °¨ Fruity smelling breath. °¨ Breathing faster or slower. °¨ Becoming very sleepy. °SEEK IMMEDIATE MEDICAL CARE IF:  °· You have signs of dehydration: °¨ Decreased urination. °¨ Increased thirst. °¨ Dry skin and mouth. °¨ Light-headedness. °· Your blood glucose is very high (as advised by your health care provider) twice in a row. °· You faint. °· You have chest pain or trouble breathing. °· You have a sudden, severe headache. °· You have sudden weakness in one arm or one leg. °· You have sudden trouble speaking or swallowing. °· You   have vomiting or diarrhea that is getting worse after 3 hours. °· You have abdominal pain. °MAKE SURE YOU:  °· Understand these instructions. °· Will watch your condition. °· Will get help right away if you are not doing well or get worse. °Document Released: 02/19/2000 Document Revised: 02/26/2013 Document Reviewed: 08/27/2008 °[employer]® Patient Information ©2015 [employer]  [employer]. This information is not intended to replace advice given to you by your health care provider. Make sure you discuss any questions you have with your health care provider. ° °

## 2013-10-05 NOTE — ED Notes (Signed)
Pt stated he was not going to take the insulin gtt or be admitted to the hospital for treatment. Informed dr Roxanne Mins of this. Dr Roxanne Mins went in to see pt and informed him of dangers of his condition. Pt CBG was rechecked and resulted at 404. Pt asked to speak with mother alone.

## 2013-10-05 NOTE — Progress Notes (Signed)
Patient admitted by Dr. Darrick Meigs earlier this morning  Patient seen and examined.  He has been admitted with diabetic ketoacidosis. He is a type I diabetic and was unable to secure any insulin for the past several days. He was apparently having issues with his Medicaid. His family reports that his insurance issues have now resolved and he is able to get medications. He has been started on intravenous fluids and insulin infusion per DKA protocol. His anion gap has closed. We will transition to subcutaneous insulin. Advance diet and monitor his blood sugars.  Pedro [name]

## 2013-10-05 NOTE — H&P (Addendum)
PCP:   Chevis Pretty, FNP   Chief Complaint:  Nausea and vomiting  HPI:  18 year old male who  has a past medical history of ADHD (attention deficit hyperactivity disorder) and DM type 1 (diabetes mellitus, type 1). Today came to the ED complaining of generalized pain to the chest abdominal and feet shoulder which has been going on for past 3 days. Patient says that he was dehydrated because he was having nausea and vomiting and has not been eating and drinking for past 2 days. Patient has diabetes mellitus and ran out of Medicaid so he could not get Lantus. He was getting Levemir from a friend.  In the ED patient was found to have elevated blood glucose greater than 600 and DKA.   Allergies:   Allergies  Allergen Reactions  . Sulfa Antibiotics Hives    fever      Past Medical History  Diagnosis Date  . ADHD (attention deficit hyperactivity disorder)   . DM type 1 (diabetes mellitus, type 1)     diagnosed age 70    Past Surgical History  Procedure Laterality Date  . None      Prior to Admission medications   Medication Sig Start Date End Date Taking? Authorizing Provider  insulin aspart (NOVOLOG) 100 UNIT/ML injection Inject 12-20 Units into the skin 3 (three) times daily before meals. Sliding scale as directed. Pt takes anywhere from 12 to 20 units. 10/31/11  Yes Kandis Nab, MD  insulin glargine (LANTUS) 100 UNIT/ML injection Inject 45 Units into the skin at bedtime.   Yes Historical Provider, MD  albuterol (PROVENTIL HFA;VENTOLIN HFA) 108 (90 BASE) MCG/ACT inhaler Inhale 2 puffs into the lungs every 6 (six) hours as needed for wheezing or shortness of breath. 04/05/13   Mary-Margaret Hassell Done, FNP  amoxicillin-clavulanate (AUGMENTIN) 875-125 MG per tablet Take 1 tablet by mouth 2 (two) times daily. 03/10/13   Jasper Riling. Pickering, MD  HYDROcodone-acetaminophen (HYCET) 7.5-325 mg/15 ml solution Take 15 mLs by mouth every 6 (six) hours as needed for moderate pain.  03/10/13   Jasper Riling. Pickering, MD  Insulin Pen Needle (PEN NEEDLES) 31G X 6 MM MISC CHECK BLOOD SUGAR 3 TIMES A DAY    Mary-Margaret Hassell Done, FNP  omeprazole (PRILOSEC) 40 MG capsule Take 1 capsule (40 mg total) by mouth daily. 04/05/13   Mary-Margaret Hassell Done, FNP    Social History:  reports that he has been smoking Cigarettes.  He has a 2 pack-year smoking history. He does not have any smokeless tobacco history on file. He reports that he drinks alcohol. He reports that he uses illicit drugs (Marijuana, Other-see comments, Benzodiazepines, Hydrocodone, and Oxycodone).  All the positives are listed in BOLD  Review of Systems:  HEENT: Headache, blurred vision, runny nose, sore throat Neck: Hypothyroidism, hyperthyroidism,,lymphadenopathy Chest : Shortness of breath, history of COPD, Asthma Heart : Chest pain, history of coronary arterey disease GI:  Nausea, vomiting, diarrhea, constipation, GERD GU: Dysuria, urgency, frequency of urination, hematuria Neuro: Stroke, seizures, syncope Psych: Depression, anxiety, hallucinations   Physical Exam: Blood pressure 129/78, pulse 107, temperature 98.1 F (36.7 C), temperature source Oral, resp. rate 18, height 5\' 11"  (1.803 m), weight 58.968 kg (130 lb), SpO2 100.00%. Constitutional:   Patient is a well-developed and well-nourished male in no acute distress and cooperative with exam. Head: Normocephalic and atraumatic Mouth: Mucus membranes moist Eyes: PERRL, EOMI, conjunctivae normal Neck: Supple, No Thyromegaly Cardiovascular: RRR, S1 normal, S2 normal Pulmonary/Chest: CTAB, no wheezes, rales, or rhonchi  Abdominal: Soft. Non-tender, non-distended, bowel sounds are normal, no masses, organomegaly, or guarding present.  Neurological: A&O x3, Strenght is normal and symmetric bilaterally, cranial nerve II-XII are grossly intact, no focal motor deficit, sensory intact to light touch bilaterally.  Extremities : No Cyanosis, Clubbing or Edema  Labs  on Admission:  Basic Metabolic Panel:  Recent Labs Lab 10/04/13 2318  NA 130*  K 5.2  CL 88*  CO2 15*  GLUCOSE 680*  BUN 20  CREATININE 0.94  CALCIUM 9.5   Liver Function Tests:  Recent Labs Lab 10/04/13 2318  AST 9  ALT 9  ALKPHOS 101  BILITOT 2.1*  PROT 7.6  ALBUMIN 4.5    Recent Labs Lab 10/04/13 2318  LIPASE 13   No results found for this basename: AMMONIA,  in the last 168 hours CBC:  Recent Labs Lab 10/04/13 2318  WBC 10.4  NEUTROABS 7.4  HGB 17.4*  HCT 50.2  MCV 90.8  PLT 248   Cardiac Enzymes: No results found for this basename: CKTOTAL, CKMB, CKMBINDEX, TROPONINI,  in the last 168 hours  BNP (last 3 results) No results found for this basename: PROBNP,  in the last 8760 hours CBG:  Recent Labs Lab 10/04/13 2311 10/05/13 0108  GLUCAP >600* 404*    Radiological Exams on Admission: Dg Chest Port 1 View  10/04/2013   CLINICAL DATA:  Hyperglycemia. Generalized chest pain. Onset 3 days ago. Smoker.  EXAM: PORTABLE CHEST - 1 VIEW  COMPARISON:  10/15/2012  FINDINGS: The heart size and mediastinal contours are within normal limits. Both lungs are clear. The visualized skeletal structures are unremarkable.  IMPRESSION: No active disease.   Electronically Signed   By: Lucienne Capers M.D.   On: 10/04/2013 23:46     Assessment/Plan Principal Problem:   Diabetic ketoacidosis Active Problems:   Noncompliance   DKA (diabetic ketoacidoses)  Diabetic ketoacidosis Patient has DKA with AG of 27, will start DKA protocol. IV insulin will be started, will check BMP every 4 hours as per DKA protocol. Will start IV normal saline at 125 mL per hour.  Diabetes mellitus Noncompliance due to lack of health insurance. Will need case management assistance for getting insulin at home.  Hyponatremia Sugar is 130, likely pseudohyponatremia due to hyperglycemia. We'll follow BMP  Tobacco abuse Patient takes half pack cigarettes per day, will start nicotine  patch 21 mg daily.  Code status: Full code  Family discussion: No family at bedside   Time Spent on Admission: 60 minutes  [facility] Pager: [phone] 10/05/2013, 2:26 AM  If 7PM-7AM, please contact night-coverage  [email or web address]  Password [email or web address]

## 2013-10-05 NOTE — Discharge Summary (Signed)
Physician Discharge Summary  Pedro [name]:[id number] DOB: 05/02/1995 DOA: 10/04/2013  PCP: Chevis Pretty, FNP  Admit date: 10/04/2013 Discharge date: 10/05/2013  Time spent: 30 minutes  Recommendations for Outpatient Follow-up:  1. Follow up with primary care physician in 2 weeks  Discharge Diagnoses:  Principal Problem:   Diabetic ketoacidosis Active Problems:   Noncompliance   DKA (diabetic ketoacidoses)   Discharge Condition: improved  Diet recommendation: low carb  Filed Weights   10/04/13 2304 10/05/13 0400  Weight: 58.968 kg (130 lb) 60.782 kg (134 lb)    History of present illness and hospital course:  This is a 18 year old type I diabetic who reports that he recently was unable to get any insulin due to insurance issues. Patient developed nausea vomiting, generalized body pain and presents to the emergency room in diabetic ketoacidosis. He was noted to be significantly dehydrated and hyperglycemic. He was started on IV fluids and insulin infusion per DKA protocol. His anion gap closed and his blood sugars had stabilized. Patient started on carb modified diet and tolerated this without any problems. He was transitioned to subcutaneous insulin and his home dosing. Patient was extremely insistent on being discharged today and threatened to leave [place] if he was not discharge. In an effort to ensure that patient would have the appropriate medications at home, he was prescribed his normal dose of Lantus and NovoLog which she takes at home. He reports that he now has Medicaid and should be able to secure these medications. He's been advised to followup with his primary care physician next 1-2 weeks and to keep himself well-hydrated.  Procedures:    Consultations:    Discharge Exam: Filed Vitals:   10/05/13 0834  BP:   Pulse:   Temp: 97.4 F (36.3 C)  Resp:     General: NAD Cardiovascular: S1, S2 RRR Respiratory: CTA B  Discharge  Instructions You were cared for by a hospitalist during your hospital stay. If you have any questions about your discharge medications or the care you received while you were in the hospital after you are discharged, you can call the unit and asked to speak with the hospitalist on call if the hospitalist that took care of you is not available. Once you are discharged, your primary care physician will handle any further medical issues. Please note that NO REFILLS for any discharge medications will be authorized once you are discharged, as it is imperative that you return to your primary care physician (or establish a relationship with a primary care physician if you do not have one) for your aftercare needs so that they can reassess your need for medications and monitor your lab values.  Discharge Instructions   Diet Carb Modified    Complete by:  As directed      Increase activity slowly    Complete by:  As directed             Medication List    STOP taking these medications       insulin aspart 100 UNIT/ML injection  Commonly known as:  novoLOG  Replaced by:  insulin aspart 100 UNIT/ML FlexPen     insulin glargine 100 UNIT/ML injection  Commonly known as:  LANTUS  Replaced by:  Insulin Glargine 100 UNIT/ML Solostar Pen      TAKE these medications       Blood Glucose Monitoring Suppl SUPPLIES Misc  - Dispense based on patient and insurance preference. Use up to 4 times daily  as directed (ICD 9, 250.0)  -   - Dispense 120 strips  - Dispense 100 lancets  - Dispense 100 pen needles     insulin aspart 100 UNIT/ML FlexPen  Commonly known as:  NOVOLOG FLEXPEN  Inject 5-10 Units into the skin 3 (three) times daily with meals.     Insulin Glargine 100 UNIT/ML Solostar Pen  Commonly known as:  LANTUS  Inject 46 Units into the skin daily.       Allergies  Allergen Reactions  . Sulfa Antibiotics Hives    fever       Follow-up Information   Follow up with Chevis Pretty, FNP. Schedule an appointment as soon as possible for a visit in 2 weeks.   Specialty:  Nurse Practitioner   Contact information:   [place] [address] [phone]        The results of significant diagnostics from this hospitalization (including imaging, microbiology, ancillary and laboratory) are listed below for reference.    Significant Diagnostic Studies: Dg Chest Port 1 View  10/04/2013   CLINICAL DATA:  Hyperglycemia. Generalized chest pain. Onset 3 days ago. Smoker.  EXAM: PORTABLE CHEST - 1 VIEW  COMPARISON:  10/15/2012  FINDINGS: The heart size and mediastinal contours are within normal limits. Both lungs are clear. The visualized skeletal structures are unremarkable.  IMPRESSION: No active disease.   Electronically Signed   By: Lucienne Capers M.D.   On: 10/04/2013 23:46    Microbiology: Recent Results (from the past 240 hour(s))  MRSA PCR SCREENING     Status: None   Collection Time    10/05/13  4:13 AM      Result Value Ref Range Status   MRSA by PCR NEGATIVE  NEGATIVE Final   Comment:            The GeneXpert MRSA Assay (FDA     approved for NASAL specimens     only), is one component of a     comprehensive MRSA colonization     surveillance program. It is not     intended to diagnose MRSA     infection nor to guide or     monitor treatment for     MRSA infections.     Labs: Basic Metabolic Panel:  Recent Labs Lab 10/04/13 2318 10/05/13 0250 10/05/13 0634 10/05/13 0849  NA 130* 134* 138 139  K 5.2 4.7 3.7 4.0  CL 88* 98 105 105  CO2 15* 19 22 24   GLUCOSE 680* 390* 185* 104*  BUN 20 19 18 17   CREATININE 0.94 0.83 0.68 0.75  CALCIUM 9.5 8.3* 8.4 8.3*   Liver Function Tests:  Recent Labs Lab 10/04/13 2318  AST 9  ALT 9  ALKPHOS 101  BILITOT 2.1*  PROT 7.6  ALBUMIN 4.5    Recent Labs Lab 10/04/13 2318  LIPASE 13   No results found for this basename: AMMONIA,  in the last 168 hours CBC:  Recent  Labs Lab 10/04/13 2318  WBC 10.4  NEUTROABS 7.4  HGB 17.4*  HCT 50.2  MCV 90.8  PLT 248   Cardiac Enzymes: No results found for this basename: CKTOTAL, CKMB, CKMBINDEX, TROPONINI,  in the last 168 hours BNP: BNP (last 3 results) No results found for this basename: PROBNP,  in the last 8760 hours CBG:  Recent Labs Lab 10/05/13 0743 10/05/13 0841 10/05/13 0943 10/05/13 1044 10/05/13 1148  GLUCAP 137* 91 86 138* 206*  Signed:  MEMON,JEHANZEB  [facility] 10/05/2013, 1:45 PM

## 2013-10-05 NOTE — ED Notes (Signed)
Pt refusing insulin gtt at this time

## 2013-10-07 NOTE — Progress Notes (Signed)
UR review complete.  

## 2013-10-27 ENCOUNTER — Encounter (HOSPITAL_COMMUNITY): Payer: Self-pay | Admitting: Emergency Medicine

## 2013-10-27 ENCOUNTER — Inpatient Hospital Stay (HOSPITAL_COMMUNITY)
Admission: EM | Admit: 2013-10-27 | Discharge: 2013-10-27 | DRG: 639 | Discharge status: Against Medical Advice | Payer: Medicaid Other | Attending: Emergency Medicine | Admitting: Emergency Medicine

## 2013-10-27 DIAGNOSIS — Z794 Long term (current) use of insulin: Secondary | ICD-10-CM | POA: Diagnosis not present

## 2013-10-27 DIAGNOSIS — R1110 Vomiting, unspecified: Secondary | ICD-10-CM | POA: Diagnosis present

## 2013-10-27 DIAGNOSIS — E1110 Type 2 diabetes mellitus with ketoacidosis without coma: Secondary | ICD-10-CM | POA: Diagnosis present

## 2013-10-27 DIAGNOSIS — E1010 Type 1 diabetes mellitus with ketoacidosis without coma: Secondary | ICD-10-CM | POA: Diagnosis not present

## 2013-10-27 DIAGNOSIS — F17200 Nicotine dependence, unspecified, uncomplicated: Secondary | ICD-10-CM | POA: Diagnosis present

## 2013-10-27 DIAGNOSIS — F909 Attention-deficit hyperactivity disorder, unspecified type: Secondary | ICD-10-CM | POA: Diagnosis present

## 2013-10-27 LAB — CBC WITH DIFFERENTIAL/PLATELET
BAND NEUTROPHILS: 0 % (ref 0–10)
BASOS ABS: 0 10*3/uL (ref 0.0–0.1)
BASOS PCT: 0 % (ref 0–1)
Blasts: 0 %
EOS PCT: 0 % (ref 0–5)
Eosinophils Absolute: 0 10*3/uL (ref 0.0–0.7)
HEMATOCRIT: 51.8 % (ref 39.0–52.0)
HEMOGLOBIN: 17.6 g/dL — AB (ref 13.0–17.0)
LYMPHS PCT: 13 % (ref 12–46)
Lymphs Abs: 2.2 10*3/uL (ref 0.7–4.0)
MCH: 31.8 pg (ref 26.0–34.0)
MCHC: 34 g/dL (ref 30.0–36.0)
MCV: 93.5 fL (ref 78.0–100.0)
MONO ABS: 0.2 10*3/uL (ref 0.1–1.0)
MONOS PCT: 1 % — AB (ref 3–12)
Metamyelocytes Relative: 0 %
Myelocytes: 0 %
NEUTROS ABS: 14.4 10*3/uL — AB (ref 1.7–7.7)
NEUTROS PCT: 86 % — AB (ref 43–77)
Platelets: 327 10*3/uL (ref 150–400)
Promyelocytes Absolute: 0 %
RBC: 5.54 MIL/uL (ref 4.22–5.81)
RDW: 13.2 % (ref 11.5–15.5)
WBC: 16.8 10*3/uL — ABNORMAL HIGH (ref 4.0–10.5)
nRBC: 0 /100 WBC

## 2013-10-27 LAB — URINALYSIS, ROUTINE W REFLEX MICROSCOPIC
Bilirubin Urine: NEGATIVE
Glucose, UA: 500 mg/dL — AB
Hgb urine dipstick: NEGATIVE
Ketones, ur: >80 mg/dL — AB
LEUKOCYTES UA: NEGATIVE
NITRITE: NEGATIVE
PROTEIN: NEGATIVE mg/dL
Specific Gravity, Urine: 1.025 (ref 1.005–1.030)
Urobilinogen, UA: 0.2 mg/dL (ref 0.0–1.0)
pH: 5.5 (ref 5.0–8.0)

## 2013-10-27 LAB — COMPREHENSIVE METABOLIC PANEL
ALT: 10 U/L (ref 0–53)
AST: 13 U/L (ref 0–37)
Albumin: 4.7 g/dL (ref 3.5–5.2)
Alkaline Phosphatase: 114 U/L (ref 39–117)
Anion gap: 31 — ABNORMAL HIGH (ref 5–15)
BUN: 22 mg/dL (ref 6–23)
CALCIUM: 9.9 mg/dL (ref 8.4–10.5)
CO2: 11 mEq/L — ABNORMAL LOW (ref 19–32)
Chloride: 93 mEq/L — ABNORMAL LOW (ref 96–112)
Creatinine, Ser: 0.84 mg/dL (ref 0.50–1.35)
GFR calc non Af Amer: >90 mL/min (ref >90)
Glucose, Bld: 387 mg/dL — ABNORMAL HIGH (ref 70–99)
POTASSIUM: 4.9 meq/L (ref 3.7–5.3)
Sodium: 135 mEq/L — ABNORMAL LOW (ref 137–147)
TOTAL PROTEIN: 8.6 g/dL — AB (ref 6.0–8.3)
Total Bilirubin: 1.1 mg/dL (ref 0.3–1.2)

## 2013-10-27 LAB — CBG MONITORING, ED
GLUCOSE-CAPILLARY: 309 mg/dL — AB (ref 70–99)
Glucose-Capillary: 366 mg/dL — ABNORMAL HIGH (ref 70–99)

## 2013-10-27 MED ORDER — INSULIN REGULAR HUMAN 100 UNIT/ML IJ SOLN
INTRAMUSCULAR | Status: DC
  Administered 2013-10-27: 2.5 [IU]/h via INTRAVENOUS
  Filled 2013-10-27: qty 2.5

## 2013-10-27 MED ORDER — ONDANSETRON HCL 4 MG/2ML IJ SOLN
INTRAMUSCULAR | Status: AC
  Filled 2013-10-27: qty 4

## 2013-10-27 MED ORDER — LEVOFLOXACIN IN D5W 750 MG/150ML IV SOLN
750.0000 mg | Freq: Once | INTRAVENOUS | Status: AC
  Administered 2013-10-27: 750 mg via INTRAVENOUS
  Filled 2013-10-27: qty 150

## 2013-10-27 MED ORDER — ONDANSETRON 8 MG/NS 50 ML IVPB
8.0000 mg | Freq: Once | INTRAVENOUS | Status: AC
  Administered 2013-10-27: 8 mg via INTRAVENOUS
  Filled 2013-10-27: qty 8

## 2013-10-27 MED ORDER — SODIUM CHLORIDE 0.9 % IV SOLN
1000.0000 mL | INTRAVENOUS | Status: DC
  Administered 2013-10-27: 1000 mL via INTRAVENOUS

## 2013-10-27 MED ORDER — DEXTROSE-NACL 5-0.45 % IV SOLN: INTRAVENOUS | Status: DC

## 2013-10-27 MED ORDER — SODIUM CHLORIDE 0.9 % IV BOLUS (SEPSIS)
1000.0000 mL | Freq: Once | INTRAVENOUS | Status: AC
  Administered 2013-10-27: 1000 mL via INTRAVENOUS

## 2013-10-27 MED ORDER — KETOROLAC TROMETHAMINE 30 MG/ML IJ SOLN
30.0000 mg | Freq: Once | INTRAMUSCULAR | Status: AC
  Administered 2013-10-27: 30 mg via INTRAVENOUS
  Filled 2013-10-27: qty 1

## 2013-10-27 NOTE — ED Notes (Addendum)
Per family with pt, pt has had  cough and congestion for a week, n/v, headache, elevated blood sugar today, pt irritable in triage, when asked a question pt's response is "hell I don't know" explained to pt why he would need to answer questions pt still irritable with response of "hell no, I don't feel good", attempted to weigh pt, pt refused to stand in triage for weight. cbg 366 in triage,

## 2013-10-27 NOTE — ED Notes (Signed)
Dr Sabra Heck spoke with patient prior to pt leaving and discussed seriousness of condition, pt remained determined to leave.

## 2013-10-27 NOTE — ED Notes (Signed)
Pt stated he was not going to go to the ICU, and was "taking care of himself at home". Stated he did not need this admission to make his health better. Per the patient, he had "taken" his sugar at home and was not "sick".

## 2013-10-27 NOTE — ED Provider Notes (Signed)
CSN: [id number]     Arrival date & time 10/27/13  1736 History  This chart was scribed for Johnna Acosta, MD by Cathie Hoops, ED Scribe. The patient was seen in [place]. The patient's care was started at 5:49 PM.     Chief Complaint  Patient presents with  . Emesis   The history is provided by the patient. No language interpreter was used.   HPI Comments: Pedro [name] is a 18 y.o. male with past medical history of DM1 who presents to the Emergency Department complaining of persistent, gradually worsening emesis with no associated diarrhea onset 9 am this morning. Pt reports associated nausea,  subjective chills, subjective fevers, photophobia. Pt reports two weeks of cough, sinus congestion and sinus draniage. Pt reports he is unable to stop his emesis. Pt reports associated headache that started before his nausea. Pt reports his blood sugars are elevated and approxiamtely 300 today. Pt reports he urinated 3 times today.  Pt reports he is complaint to his medications. Pt states he injured his right foot yesterday.  Pt denies hematuria and diarrhea.     Past Medical History  Diagnosis Date  . ADHD (attention deficit hyperactivity disorder)   . DM type 1 (diabetes mellitus, type 1)     diagnosed age 59   Past Surgical History  Procedure Laterality Date  . None     No family history on file. History  Substance Use Topics  . Smoking status: Current Every Day Smoker -- 1.00 packs/day for 2 years    Types: Cigarettes  . Smokeless tobacco: Not on file  . Alcohol Use: Yes     Comment: occassional    Review of Systems  Constitutional: Positive for fever and chills.  HENT: Positive for congestion and postnasal drip.   Eyes: Positive for photophobia.  Respiratory: Positive for cough.   Gastrointestinal: Positive for nausea and vomiting. Negative for diarrhea.  Genitourinary: Negative for hematuria.  Musculoskeletal: Positive for arthralgias (right foot).   Psychiatric/Behavioral: Positive for agitation.      Allergies  Sulfa antibiotics  Home Medications   Prior to Admission medications   Medication Sig Start Date End Date Taking? Authorizing Provider  insulin aspart (NOVOLOG FLEXPEN) 100 UNIT/ML FlexPen Inject 5-10 Units into the skin 3 (three) times daily with meals. 10/05/13  Yes Kathie Dike, MD  Insulin Glargine (LANTUS) 100 UNIT/ML Solostar Pen Inject 46 Units into the skin daily. 10/05/13  Yes Kathie Dike, MD   Triage Vitals: BP 126/74  Pulse 104  Temp(Src) 98 F (36.7 C) (Oral)  Resp 22  Ht 5\' 11"  (1.803 m)  Wt 140 lb (63.504 kg)  BMI 19.53 kg/m2  SpO2 96% Physical Exam  Nursing note and vitals reviewed. Constitutional: He appears well-developed and well-nourished. No distress.  HENT:  Head: Normocephalic and atraumatic.  Mouth/Throat: Oropharynx is clear and moist. No oropharyngeal exudate.  Eyes: Conjunctivae and EOM are normal. Pupils are equal, round, and reactive to light. Right eye exhibits no discharge. Left eye exhibits no discharge. No scleral icterus.  Neck: Normal range of motion. Neck supple. No JVD present. No thyromegaly present.  Cardiovascular: Normal rate, regular rhythm, normal heart sounds and intact distal pulses.  Exam reveals no gallop and no friction rub.   No murmur heard. Pulmonary/Chest: Effort normal and breath sounds normal. No respiratory distress. He has no wheezes. He has no rales.  Abdominal: Soft. Bowel sounds are normal. He exhibits no distension and no mass. There is no tenderness.  Musculoskeletal: Normal range of motion. He exhibits no edema and no tenderness.  Lymphadenopathy:    He has no cervical adenopathy.  Neurological: He is alert. Coordination normal.  Skin: Skin is warm and dry. No rash noted. No erythema.  Psychiatric: He has a normal mood and affect. His behavior is normal.    ED Course  Procedures (including critical care time) DIAGNOSTIC STUDIES: Oxygen  Saturation is 96% on RA, adequate by my interpretation.    COORDINATION OF CARE: 5:57 PM- Patient informed of current plan for treatment and evaluation and agrees with plan at this time.  Labs Review Labs Reviewed  COMPREHENSIVE METABOLIC PANEL - Abnormal; Notable for the following:    Sodium 135 (*)    Chloride 93 (*)    CO2 11 (*)    Glucose, Bld 387 (*)    Total Protein 8.6 (*)    Anion gap 31 (*)    All other components within normal limits  CBC WITH DIFFERENTIAL - Abnormal; Notable for the following:    WBC 16.8 (*)    Hemoglobin 17.6 (*)    Neutrophils Relative % 86 (*)    Monocytes Relative 1 (*)    Neutro Abs 14.4 (*)    All other components within normal limits  URINALYSIS, ROUTINE W REFLEX MICROSCOPIC - Abnormal; Notable for the following:    Glucose, UA 500 (*)    Ketones, ur >80 (*)    All other components within normal limits  CBG MONITORING, ED - Abnormal; Notable for the following:    Glucose-Capillary 366 (*)    All other components within normal limits    Imaging Review No results found.    MDM   Final diagnoses:  Diabetic ketoacidosis without coma associated with type 1 diabetes mellitus    I discussed this with the patient, his family members and the hospitalist. The patient appears to be in diabetic ketoacidosis which would cause all the patient's symptoms. His lab work confirms that he has ketonuria, significant anion gap of over 30 and a CO2 of 11. He remains mildly tachycardic but has improved his headache and nausea. After initiating treatment with insulin drip for this critically ill diagnosis the patient now states that he wants to leave [place] because he has many things to do tomorrow. He has been very clearly able to tell me the consequences including permanent disability or death if he leaves without appropriate treatment, he is willing to accept the risks of this decision and will leave [place]. Is 10 over 10  minutes counseling the patient regarding this illness and diagnosis and the patient continues to occur 68 and stated he wants to go and doesn't want to stay in [facility].  CRITICAL CARE Performed by: Johnna Acosta Total critical care time: 35 Critical care time was exclusive of separately billable procedures and treating other patients. Critical care was necessary to treat or prevent imminent or life-threatening deterioration. Critical care was time spent personally by me on the following activities: development of treatment plan with patient and/or surrogate as well as nursing, discussions with consultants, evaluation of patient's response to treatment, examination of patient, obtaining history from patient or surrogate, ordering and performing treatments and interventions, ordering and review of laboratory studies, ordering and review of radiographic studies, pulse oximetry and re-evaluation of patient's condition.  He refuses to listen to his need for ongoing treatment including his care at home this evening, he appears lucid and able to make  these decisions for himself  Meds given in ED:  Medications  ondansetron (ZOFRAN) 4 MG/2ML injection (not administered)  levofloxacin (LEVAQUIN) IVPB 750 mg (750 mg Intravenous Transfusing/Transfer 10/27/13 2013)  insulin regular (NOVOLIN R,HUMULIN R) 250 Units in sodium chloride 0.9 % 250 mL (1 Units/mL) infusion ( Intravenous Transfusing/Transfer 10/27/13 2013)  0.9 %  sodium chloride infusion (1,000 mLs Intravenous Transfusing/Transfer 10/27/13 2013)  dextrose 5 %-0.45 % sodium chloride infusion (not administered)  ketorolac (TORADOL) 30 MG/ML injection 30 mg (30 mg Intravenous Given 10/27/13 1828)  ondansetron (ZOFRAN) 8 mg/NS 50 ml IVPB (8 mg Intravenous Given 10/27/13 1829)  sodium chloride 0.9 % bolus 1,000 mL (0 mLs Intravenous Stopped 10/27/13 1941)  sodium chloride 0.9 % bolus 1,000 mL (0 mLs Intravenous Stopped 10/27/13 1941)      I personally performed the services described in this documentation, which was scribed in my presence. The recorded information has been reviewed and is accurate.     Johnna Acosta, MD 10/27/13 2024

## 2013-10-30 ENCOUNTER — Emergency Department (HOSPITAL_COMMUNITY): Payer: Medicaid Other

## 2013-10-30 ENCOUNTER — Emergency Department (HOSPITAL_COMMUNITY)
Admission: EM | Admit: 2013-10-30 | Discharge: 2013-10-30 | Discharge status: Against Medical Advice | Payer: Medicaid Other | Attending: Emergency Medicine | Admitting: Emergency Medicine

## 2013-10-30 ENCOUNTER — Encounter (HOSPITAL_COMMUNITY): Payer: Self-pay | Admitting: Emergency Medicine

## 2013-10-30 DIAGNOSIS — E109 Type 1 diabetes mellitus without complications: Secondary | ICD-10-CM | POA: Insufficient documentation

## 2013-10-30 DIAGNOSIS — G40909 Epilepsy, unspecified, not intractable, without status epilepticus: Secondary | ICD-10-CM | POA: Diagnosis present

## 2013-10-30 DIAGNOSIS — Z8659 Personal history of other mental and behavioral disorders: Secondary | ICD-10-CM | POA: Diagnosis not present

## 2013-10-30 DIAGNOSIS — F17200 Nicotine dependence, unspecified, uncomplicated: Secondary | ICD-10-CM | POA: Insufficient documentation

## 2013-10-30 DIAGNOSIS — I959 Hypotension, unspecified: Secondary | ICD-10-CM | POA: Insufficient documentation

## 2013-10-30 DIAGNOSIS — R569 Unspecified convulsions: Secondary | ICD-10-CM

## 2013-10-30 DIAGNOSIS — E162 Hypoglycemia, unspecified: Secondary | ICD-10-CM

## 2013-10-30 DIAGNOSIS — Z794 Long term (current) use of insulin: Secondary | ICD-10-CM | POA: Diagnosis not present

## 2013-10-30 LAB — COMPREHENSIVE METABOLIC PANEL
ALK PHOS: 75 U/L (ref 39–117)
ALT: 8 U/L (ref 0–53)
AST: 11 U/L (ref 0–37)
Albumin: 3.6 g/dL (ref 3.5–5.2)
Anion gap: 15 (ref 5–15)
BILIRUBIN TOTAL: 0.7 mg/dL (ref 0.3–1.2)
BUN: 16 mg/dL (ref 6–23)
CHLORIDE: 106 meq/L (ref 96–112)
CO2: 24 meq/L (ref 19–32)
CREATININE: 0.71 mg/dL (ref 0.50–1.35)
Calcium: 9.1 mg/dL (ref 8.4–10.5)
GFR calc Af Amer: >90 mL/min (ref >90)
Glucose, Bld: 34 mg/dL — CL (ref 70–99)
Potassium: 3.5 mEq/L — ABNORMAL LOW (ref 3.7–5.3)
Sodium: 145 mEq/L (ref 137–147)
Total Protein: 6.8 g/dL (ref 6.0–8.3)

## 2013-10-30 LAB — CBC WITH DIFFERENTIAL/PLATELET
Basophils Absolute: 0 10*3/uL (ref 0.0–0.1)
Basophils Relative: 0 % (ref 0–1)
Eosinophils Absolute: 0.1 10*3/uL (ref 0.0–0.7)
Eosinophils Relative: 1 % (ref 0–5)
HEMATOCRIT: 48.8 % (ref 39.0–52.0)
HEMOGLOBIN: 16.4 g/dL (ref 13.0–17.0)
LYMPHS PCT: 18 % (ref 12–46)
Lymphs Abs: 2 10*3/uL (ref 0.7–4.0)
MCH: 31.1 pg (ref 26.0–34.0)
MCHC: 33.6 g/dL (ref 30.0–36.0)
MCV: 92.4 fL (ref 78.0–100.0)
MONO ABS: 0.5 10*3/uL (ref 0.1–1.0)
Monocytes Relative: 5 % (ref 3–12)
Neutro Abs: 8.4 10*3/uL — ABNORMAL HIGH (ref 1.7–7.7)
Neutrophils Relative %: 76 % (ref 43–77)
Platelets: 316 10*3/uL (ref 150–400)
RBC: 5.28 MIL/uL (ref 4.22–5.81)
RDW: 13.7 % (ref 11.5–15.5)
WBC: 11 10*3/uL — AB (ref 4.0–10.5)

## 2013-10-30 LAB — I-STAT VENOUS BLOOD GAS, ED
BICARBONATE: 26.8 meq/L — AB (ref 20.0–24.0)
O2 SAT: 36 %
TCO2: 28 mmol/L (ref 0–100)
pCO2, Ven: 48.3 mmHg (ref 45.0–50.0)
pH, Ven: 7.352 — ABNORMAL HIGH (ref 7.250–7.300)
pO2, Ven: 23 mmHg — CL (ref 30.0–45.0)

## 2013-10-30 LAB — CBG MONITORING, ED
Glucose-Capillary: 42 mg/dL — CL (ref 70–99)
Glucose-Capillary: 124 mg/dL — ABNORMAL HIGH (ref 70–99)
Glucose-Capillary: 103 mg/dL — ABNORMAL HIGH (ref 70–99)
Glucose-Capillary: 81 mg/dL (ref 70–99)
Glucose-Capillary: 44 mg/dL — CL (ref 70–99)

## 2013-10-30 MED ORDER — DEXTROSE 50 % IV SOLN
25.0000 mL | Freq: Once | INTRAVENOUS | Status: AC
  Administered 2013-10-30: 25 mL via INTRAVENOUS
  Filled 2013-10-30: qty 50

## 2013-10-30 MED ORDER — KETOROLAC TROMETHAMINE 30 MG/ML IJ SOLN
30.0000 mg | Freq: Once | INTRAMUSCULAR | Status: AC
  Administered 2013-10-30: 30 mg via INTRAVENOUS
  Filled 2013-10-30: qty 1

## 2013-10-30 MED ORDER — SODIUM CHLORIDE 0.9 % IV BOLUS (SEPSIS): 1000.0000 mL | Freq: Once | INTRAVENOUS | Status: DC

## 2013-10-30 NOTE — ED Notes (Signed)
Toileting offered, pt unable to give urine sample at this time.

## 2013-10-30 NOTE — ED Notes (Signed)
Per EMS, pt was riding in the car with his mother when he experienced a seizure, the mother states the seizure lasted approximately 5 minutes. Mother states pt was not responding to her during the seizure activity. No oral trauma present, no incontinence of bowel or bladder. Pt a&o X4 at this time.

## 2013-10-30 NOTE — ED Provider Notes (Signed)
CSN: [id number]     Arrival date & time 10/30/13  1049 History  This chart was scribed for non-physician practitioner, Alvina Chou, PA-C working with Ephraim Hamburger, MD by Frederich Balding, ED scribe. This patient was seen in room E39C/E39C and the patient's care was started at 11:079 AM.   Chief Complaint  Patient presents with  . Seizures   The history is provided by the patient. No language interpreter was used.   HPI Comments: Pedro [name] is a 18 y.o. male who presents to the Emergency Department complaining of a seizure that occurred prior to arrival around 10 AM. Family member states it happened while they were in the car. Pt was not driving. Reports he now has a headache. Denies hitting his head. Denies history of seizures. States EMS checked his sugar on scene and it was 80 then checked again on arrival and it was 91. Pt was evaluated on 10/27/13 for emesis by Dr. Sabra Heck. Dr. Sabra Heck wanted to admit the pt for diabetic ketoacidosis but pt states that he wanted to leave AMA because he had many things to do. Denies fever, abdominal pain.   Past Medical History  Diagnosis Date  . ADHD (attention deficit hyperactivity disorder)   . DM type 1 (diabetes mellitus, type 1)     diagnosed age 81   Past Surgical History  Procedure Laterality Date  . None     History reviewed. No pertinent family history. History  Substance Use Topics  . Smoking status: Current Every Day Smoker -- 1.00 packs/day for 2 years    Types: Cigarettes  . Smokeless tobacco: Not on file  . Alcohol Use: Yes     Comment: occassional    Review of Systems  Constitutional: Negative for fever.  Gastrointestinal: Negative for abdominal pain.  Neurological: Positive for seizures and headaches.  All other systems reviewed and are negative.  Allergies  Sulfa antibiotics  Home Medications   Prior to Admission medications   Medication Sig Start Date End Date Taking? Authorizing Provider  insulin aspart  (NOVOLOG FLEXPEN) 100 UNIT/ML FlexPen Inject 5-10 Units into the skin 3 (three) times daily with meals. 10/05/13   Kathie Dike, MD  Insulin Glargine (LANTUS) 100 UNIT/ML Solostar Pen Inject 46 Units into the skin daily. 10/05/13   Kathie Dike, MD   BP 113/76  Pulse 93  Temp(Src) 98.4 F (36.9 C) (Oral)  Resp 23  Ht 5\' 11"  (1.803 m)  Wt 145 lb (65.772 kg)  BMI 20.23 kg/m2  SpO2 99%  Physical Exam  Nursing note and vitals reviewed. Constitutional: He is oriented to person, place, and time. He appears well-developed and well-nourished. No distress.  HENT:  Head: Normocephalic and atraumatic.  Eyes: Conjunctivae and EOM are normal.  Neck: Neck supple. No tracheal deviation present.  Cardiovascular: Normal rate, regular rhythm and normal heart sounds.   Pulmonary/Chest: Effort normal and breath sounds normal. No respiratory distress. He has no wheezes. He has no rales.  Abdominal: Soft. There is no tenderness.  Musculoskeletal: Normal range of motion.  Neurological: He is alert and oriented to person, place, and time. No cranial nerve deficit. Coordination normal.  Skin: Skin is warm and dry.  Psychiatric: His behavior is normal.  Flat affect.    ED Course  Procedures (including critical care time)  CRITICAL CARE Performed by: Alvina Chou   Total critical care time: 30 minutes  Critical care time was exclusive of separately billable procedures and treating other patients.  Critical care  was necessary to treat or prevent imminent or life-threatening deterioration.  Critical care was time spent personally by me on the following activities: development of treatment plan with patient and/or surrogate as well as nursing, discussions with consultants, evaluation of patient's response to treatment, examination of patient, obtaining history from patient or surrogate, ordering and performing treatments and interventions, ordering and review of laboratory studies, ordering and  review of radiographic studies, pulse oximetry and re-evaluation of patient's condition.   DIAGNOSTIC STUDIES: Oxygen Saturation is 99% on RA, normal by my interpretation.    COORDINATION OF CARE: 11:10 AM-Discussed treatment plan which includes blood work and head CT with pt at bedside and pt agreed to plan.   Labs Review Labs Reviewed  CBC WITH DIFFERENTIAL - Abnormal; Notable for the following:    WBC 11.0 (*)    Neutro Abs 8.4 (*)    All other components within normal limits  COMPREHENSIVE METABOLIC PANEL - Abnormal; Notable for the following:    Potassium 3.5 (*)    Glucose, Bld 34 (*)    All other components within normal limits  CBG MONITORING, ED - Abnormal; Notable for the following:    Glucose-Capillary 42 (*)    All other components within normal limits  I-STAT VENOUS BLOOD GAS, ED - Abnormal; Notable for the following:    pH, Ven 7.352 (*)    pO2, Ven 23.0 (*)    Bicarbonate 26.8 (*)    All other components within normal limits  CBG MONITORING, ED - Abnormal; Notable for the following:    Glucose-Capillary 124 (*)    All other components within normal limits  CBG MONITORING, ED - Abnormal; Notable for the following:    Glucose-Capillary 103 (*)    All other components within normal limits  CBG MONITORING, ED - Abnormal; Notable for the following:    Glucose-Capillary 44 (*)    All other components within normal limits  URINE RAPID DRUG SCREEN (HOSP PERFORMED)  URINALYSIS, ROUTINE W REFLEX MICROSCOPIC  CBG MONITORING, ED    Imaging Review Ct Head Wo Contrast  10/30/2013   CLINICAL DATA:  18 year old male with seizure at 10 a.m., now with headache. No history of seizures. Initial encounter.  EXAM: CT HEAD WITHOUT CONTRAST  TECHNIQUE: Contiguous axial images were obtained from the base of the skull through the vertex without intravenous contrast.  COMPARISON:  [facility] CT without contrast 02/16/2009.  FINDINGS: Mild left frontoethmoidal  recess mucosal thickening, otherwise the visible paranasal sinuses and mastoids are clear (and somewhat hyperplastic). No acute osseous abnormality identified.  Visualized orbits and scalp soft tissues are within normal limits.  Cerebral volume is normal. No midline shift, ventriculomegaly, mass effect, evidence of mass lesion, intracranial hemorrhage or evidence of cortically based acute infarction. Gray-white matter differentiation is within normal limits throughout the brain. No suspicious intracranial vascular hyperdensity.  IMPRESSION: Stable and normal noncontrast CT appearance of the brain.   Electronically Signed   By: Lars Pinks M.D.   On: 10/30/2013 12:48     EKG Interpretation None      MDM   Final diagnoses:  Hypoglycemia  Seizure    3:29 PM Patient presents with hypoglycemia after having a seizure prior to arrival arrival. Patient likely had a hypoglycemic seizure and was given half amp of D50 here and given lunch orange juice and lunch. After further conversation with the patient, he accidentally overlapped 2 insulin doses, leaving excess long acting insulin in his system. Patient's glucose is  not staying up despite food and D50, likely due to excess insulin. I spoke with the patient about being admitted and he does not want to stay here. Patient's most recent CBG is 44. Patient understands that he could go home and have another seizure and even die. Dr. Regenia Skeeter and I explained the risks associated with going home AMA and patient understands.   I personally performed the services described in this documentation, which was scribed in my presence. The recorded information has been reviewed and is accurate.  Alvina Chou, PA-C 10/30/13 1544

## 2013-10-30 NOTE — ED Notes (Signed)
EDP aware of pt's critical lab value called from main lab.  Glucose: 34

## 2013-10-30 NOTE — ED Notes (Addendum)
Dr. Regenia Skeeter aware of pt's CBG result. Pt given orange juice and [place] sandwich. Dr. Regenia Skeeter will order D50. Pt alert, oriented, and stable at this time.

## 2013-10-30 NOTE — ED Notes (Signed)
Lab results given to Dr. Goldston. 

## 2013-10-30 NOTE — Discharge Instructions (Signed)
Return to the hospital with worsening or concerning symptoms.

## 2013-10-31 NOTE — ED Provider Notes (Signed)
Medical screening examination/treatment/procedure(s) were conducted as a shared visit with non-physician practitioner(s) and myself.  I personally evaluated the patient during the encounter.   EKG Interpretation None       Patient with new-onset seizure, likely related to his rapidly dropping glucose. He was just discharged AMA couple days prior for DKA. Patient still has long acting insulin on board. Insulin came up temporarily but then started to drop again into the 40s. Patient was alert and oriented and still requests to be discharged. He's leaving AGAINST MEDICAL ADVICE. He states that he's "got this" and that he should be able to handle his low blood sugar. I discussed the risks of leaving including death, coma, seizure, permanent neurologic dysfunction. Patient understands and appears to have decision-making capacity.  Ephraim Hamburger, MD 10/31/13 [phone]

## 2013-11-01 ENCOUNTER — Encounter: Payer: Self-pay | Admitting: Nurse Practitioner

## 2013-11-01 ENCOUNTER — Ambulatory Visit (INDEPENDENT_AMBULATORY_CARE_PROVIDER_SITE_OTHER): Payer: Medicaid Other | Admitting: Nurse Practitioner

## 2013-11-01 VITALS — BP 122/81 | HR 81 | Temp 97.9°F | Ht 71.0 in | Wt 131.0 lb

## 2013-11-01 DIAGNOSIS — Z09 Encounter for follow-up examination after completed treatment for conditions other than malignant neoplasm: Secondary | ICD-10-CM

## 2013-11-01 DIAGNOSIS — G589 Mononeuropathy, unspecified: Secondary | ICD-10-CM

## 2013-11-01 DIAGNOSIS — G629 Polyneuropathy, unspecified: Secondary | ICD-10-CM

## 2013-11-01 DIAGNOSIS — R569 Unspecified convulsions: Secondary | ICD-10-CM

## 2013-11-01 DIAGNOSIS — B009 Herpesviral infection, unspecified: Secondary | ICD-10-CM

## 2013-11-01 MED ORDER — VALACYCLOVIR HCL 1 G PO TABS: 1000.0000 mg | ORAL_TABLET | Freq: Two times a day (BID) | ORAL | Status: DC

## 2013-11-01 MED ORDER — GABAPENTIN 300 MG PO CAPS: 300.0000 mg | ORAL_CAPSULE | Freq: Every day | ORAL | Status: DC

## 2013-11-01 NOTE — Patient Instructions (Signed)
Seizure, Adult A seizure means there is unusual activity in the brain. A seizure can cause changes in attention or behavior. Seizures often cause shaking (convulsions). Seizures often last from 30 seconds to 2 minutes. HOME CARE   If you are given medicines, take them exactly as told by your doctor.  Keep all doctor visits as told.  Do not swim or drive until your doctor says it is okay.  Teach others what to do if you have a seizure. They should:  Lay you on the ground.  Put a cushion under your head.  Loosen any tight clothing around your neck.  Turn you on your side.  Stay with you until you get better. GET HELP RIGHT AWAY IF:   The seizure lasts longer than 2 to 5 minutes.  The seizure is very bad.  The person does not wake up after the seizure.  The person's attention or behavior changes. Drive the person to the emergency room or call your local emergency services (911 in [place] MAKE SURE YOU:   Understand these instructions.  Will watch your condition.  Will get help right away if you are not doing well or get worse. Document Released: 08/10/2007 Document Revised: 05/16/2011 Document Reviewed: 02/09/2011 Exit[employer] Patient Information 2015 [employer]. This information is not intended to replace advice given to you by your health care provider. Make sure you discuss any questions you have with your health care provider.  

## 2013-11-01 NOTE — Addendum Note (Signed)
Addended by: Chevis Pretty on: 11/01/2013 12:26 PM   Modules accepted: Orders, Level of Service

## 2013-11-01 NOTE — Progress Notes (Addendum)
   Subjective:    Patient ID: Pedro [name], male    DOB: 1996/02/08, 18 y.o.   MRN: [id number]  HPI Patient in today for hospital follow up. He had a seizure of unknown cause. He blood sugar was but otherwise all test were negative. Has not had one since then.  - HSV 2- needs valtrex refilled -C/O burning bil feet  Review of Systems  Constitutional: Negative.   HENT: Negative.   Respiratory: Negative.   Cardiovascular: Negative.   Genitourinary: Negative.   Neurological: Negative.   Psychiatric/Behavioral: Negative.   All other systems reviewed and are negative.      Objective:   Physical Exam  Constitutional: He is oriented to person, place, and time. He appears well-developed and well-nourished.  Cardiovascular: Normal rate, regular rhythm and normal heart sounds.   Pulmonary/Chest: Effort normal and breath sounds normal.  Abdominal: Soft. Bowel sounds are normal.  Neurological: He is alert and oriented to person, place, and time.  Skin: Skin is warm and dry.  Psychiatric: He has a normal mood and affect. His behavior is normal. Judgment and thought content normal.   BP 122/81  Pulse 81  Temp(Src) 97.9 F (36.6 C)  Ht 5\' 11"  (1.803 m)  Wt 131 lb (59.421 kg)  BMI 18.28 kg/m2        Assessment & Plan:  1. Hospital discharge follow-up [place] 911 if reoccurs  2. Convulsions, unspecified convulsion type May take awile fo rreferral to go through - Ambulatory referral to Neurology  3. Herpes simplex type 2 infection Safe sex - valACYclovir (VALTREX) 1000 MG tablet; Take 1 tablet (1,000 mg total) by mouth 2 (two) times daily.  Dispense: 30 tablet; Refill: 5  4. Neuropathy Wear shoes at all times - gabapentin (NEURONTIN) 300 MG capsule; Take 1 capsule (300 mg total) by mouth at bedtime.  Dispense: 30 capsule; Refill: [place], FNP

## 2013-12-15 ENCOUNTER — Inpatient Hospital Stay (HOSPITAL_COMMUNITY)
Admission: EM | Admit: 2013-12-15 | Discharge: 2013-12-15 | DRG: 638 | Discharge status: Against Medical Advice | Payer: Medicaid Other | Attending: Internal Medicine | Admitting: Internal Medicine

## 2013-12-15 ENCOUNTER — Encounter (HOSPITAL_COMMUNITY): Payer: Self-pay | Admitting: Emergency Medicine

## 2013-12-15 DIAGNOSIS — Z9114 Patient's other noncompliance with medication regimen: Secondary | ICD-10-CM | POA: Diagnosis present

## 2013-12-15 DIAGNOSIS — A6000 Herpesviral infection of urogenital system, unspecified: Secondary | ICD-10-CM

## 2013-12-15 DIAGNOSIS — F1920 Other psychoactive substance dependence, uncomplicated: Secondary | ICD-10-CM | POA: Diagnosis present

## 2013-12-15 DIAGNOSIS — Z720 Tobacco use: Secondary | ICD-10-CM

## 2013-12-15 DIAGNOSIS — A6001 Herpesviral infection of penis: Secondary | ICD-10-CM | POA: Diagnosis present

## 2013-12-15 DIAGNOSIS — F17210 Nicotine dependence, cigarettes, uncomplicated: Secondary | ICD-10-CM | POA: Diagnosis present

## 2013-12-15 DIAGNOSIS — E1010 Type 1 diabetes mellitus with ketoacidosis without coma: Secondary | ICD-10-CM | POA: Diagnosis not present

## 2013-12-15 DIAGNOSIS — F909 Attention-deficit hyperactivity disorder, unspecified type: Secondary | ICD-10-CM | POA: Diagnosis present

## 2013-12-15 DIAGNOSIS — Z794 Long term (current) use of insulin: Secondary | ICD-10-CM | POA: Diagnosis not present

## 2013-12-15 DIAGNOSIS — F11229 Opioid dependence with intoxication, unspecified: Secondary | ICD-10-CM

## 2013-12-15 DIAGNOSIS — E0810 Diabetes mellitus due to underlying condition with ketoacidosis without coma: Secondary | ICD-10-CM

## 2013-12-15 HISTORY — DX: Herpesviral infection, unspecified: B00.9

## 2013-12-15 HISTORY — DX: Herpesviral infection of urogenital system, unspecified: A60.00

## 2013-12-15 HISTORY — DX: Tobacco use: Z72.0

## 2013-12-15 HISTORY — DX: Type 1 diabetes mellitus with ketoacidosis without coma: E10.10

## 2013-12-15 LAB — CBG MONITORING, ED
Glucose-Capillary: >600 mg/dL (ref 70–99)
Glucose-Capillary: >600 mg/dL (ref 70–99)
Glucose-Capillary: >600 mg/dL (ref 70–99)

## 2013-12-15 LAB — BLOOD GAS, VENOUS
Acid-base deficit: 0.1 mmol/L (ref 0.0–2.0)
Bicarbonate: 24.7 mEq/L — ABNORMAL HIGH (ref 20.0–24.0)
Drawn by: 105551
O2 SAT: 91.7 %
PCO2 VEN: 45.8 mmHg (ref 45.0–50.0)
PO2 VEN: 58.9 mmHg — AB (ref 30.0–45.0)
Patient temperature: 37
TCO2: 21.2 mmol/L (ref 0–100)
pH, Ven: 7.352 — ABNORMAL HIGH (ref 7.250–7.300)

## 2013-12-15 LAB — BASIC METABOLIC PANEL
Anion gap: 16 — ABNORMAL HIGH (ref 5–15)
Anion gap: 14 (ref 5–15)
Anion gap: 11 (ref 5–15)
BUN: 26 mg/dL — AB (ref 6–23)
BUN: 22 mg/dL (ref 6–23)
BUN: 20 mg/dL (ref 6–23)
CHLORIDE: 83 meq/L — AB (ref 96–112)
CO2: 24 mEq/L (ref 19–32)
CO2: 24 mEq/L (ref 19–32)
CO2: 26 mEq/L (ref 19–32)
CREATININE: 0.68 mg/dL (ref 0.50–1.35)
CREATININE: 0.7 mg/dL (ref 0.50–1.35)
Calcium: 9.6 mg/dL (ref 8.4–10.5)
Calcium: 8.6 mg/dL (ref 8.4–10.5)
Calcium: 8.6 mg/dL (ref 8.4–10.5)
Chloride: 96 mEq/L (ref 96–112)
Chloride: 103 mEq/L (ref 96–112)
Creatinine, Ser: 0.76 mg/dL (ref 0.50–1.35)
GFR calc Af Amer: >90 mL/min (ref >90)
GFR calc Af Amer: >90 mL/min (ref >90)
GFR calc non Af Amer: >90 mL/min (ref >90)
GLUCOSE: 1003 mg/dL — AB (ref 70–99)
Glucose, Bld: 471 mg/dL — ABNORMAL HIGH (ref 70–99)
Glucose, Bld: 136 mg/dL — ABNORMAL HIGH (ref 70–99)
POTASSIUM: 4.9 meq/L (ref 3.7–5.3)
Potassium: 3.6 mEq/L — ABNORMAL LOW (ref 3.7–5.3)
Potassium: 4 mEq/L (ref 3.7–5.3)
Sodium: 123 mEq/L — ABNORMAL LOW (ref 137–147)
Sodium: 134 mEq/L — ABNORMAL LOW (ref 137–147)
Sodium: 140 mEq/L (ref 137–147)

## 2013-12-15 LAB — CBC
HEMATOCRIT: 45.9 % (ref 39.0–52.0)
Hemoglobin: 16 g/dL (ref 13.0–17.0)
MCH: 31.9 pg (ref 26.0–34.0)
MCHC: 34.9 g/dL (ref 30.0–36.0)
MCV: 91.6 fL (ref 78.0–100.0)
Platelets: 284 10*3/uL (ref 150–400)
RBC: 5.01 MIL/uL (ref 4.22–5.81)
RDW: 12.7 % (ref 11.5–15.5)
WBC: 11.2 10*3/uL — ABNORMAL HIGH (ref 4.0–10.5)

## 2013-12-15 LAB — URINALYSIS, ROUTINE W REFLEX MICROSCOPIC
Bilirubin Urine: NEGATIVE
Glucose, UA: >1000 mg/dL — AB
Hgb urine dipstick: NEGATIVE
LEUKOCYTES UA: NEGATIVE
NITRITE: NEGATIVE
PH: 7 (ref 5.0–8.0)
Protein, ur: NEGATIVE mg/dL
Urobilinogen, UA: 0.2 mg/dL (ref 0.0–1.0)

## 2013-12-15 LAB — GLUCOSE, CAPILLARY
GLUCOSE-CAPILLARY: 290 mg/dL — AB (ref 70–99)
GLUCOSE-CAPILLARY: 104 mg/dL — AB (ref 70–99)
Glucose-Capillary: 458 mg/dL — ABNORMAL HIGH (ref 70–99)
Glucose-Capillary: 232 mg/dL — ABNORMAL HIGH (ref 70–99)
Glucose-Capillary: 104 mg/dL — ABNORMAL HIGH (ref 70–99)
Glucose-Capillary: 107 mg/dL — ABNORMAL HIGH (ref 70–99)
Glucose-Capillary: 324 mg/dL — ABNORMAL HIGH (ref 70–99)

## 2013-12-15 LAB — CBC WITH DIFFERENTIAL/PLATELET
Basophils Absolute: 0.1 10*3/uL (ref 0.0–0.1)
Basophils Relative: 1 % (ref 0–1)
Eosinophils Absolute: 0.1 10*3/uL (ref 0.0–0.7)
Eosinophils Relative: 1 % (ref 0–5)
HCT: 51.7 % (ref 39.0–52.0)
HEMOGLOBIN: 16.9 g/dL (ref 13.0–17.0)
LYMPHS ABS: 3.2 10*3/uL (ref 0.7–4.0)
LYMPHS PCT: 33 % (ref 12–46)
MCH: 32.4 pg (ref 26.0–34.0)
MCHC: 32.7 g/dL (ref 30.0–36.0)
MCV: 99 fL (ref 78.0–100.0)
MONOS PCT: 6 % (ref 3–12)
Monocytes Absolute: 0.6 10*3/uL (ref 0.1–1.0)
NEUTROS PCT: 59 % (ref 43–77)
Neutro Abs: 5.8 10*3/uL (ref 1.7–7.7)
Platelets: 256 10*3/uL (ref 150–400)
RBC: 5.22 MIL/uL (ref 4.22–5.81)
RDW: 13.1 % (ref 11.5–15.5)
WBC: 9.7 10*3/uL (ref 4.0–10.5)

## 2013-12-15 LAB — RAPID URINE DRUG SCREEN, HOSP PERFORMED
Amphetamines: NOT DETECTED
Barbiturates: NOT DETECTED
Benzodiazepines: NOT DETECTED
Cocaine: NOT DETECTED
OPIATES: NOT DETECTED
Tetrahydrocannabinol: POSITIVE — AB

## 2013-12-15 LAB — URINE MICROSCOPIC-ADD ON

## 2013-12-15 LAB — MRSA PCR SCREENING: MRSA by PCR: NEGATIVE

## 2013-12-15 MED ORDER — VALACYCLOVIR HCL 500 MG PO TABS
ORAL_TABLET | ORAL | Status: AC
  Filled 2013-12-15: qty 2

## 2013-12-15 MED ORDER — MORPHINE SULFATE 4 MG/ML IJ SOLN
4.0000 mg | Freq: Once | INTRAMUSCULAR | Status: AC
  Administered 2013-12-15: 4 mg via INTRAVENOUS
  Filled 2013-12-15: qty 1

## 2013-12-15 MED ORDER — SODIUM CHLORIDE 0.9 % IV SOLN
INTRAVENOUS | Status: DC
  Administered 2013-12-15: 05:00:00 via INTRAVENOUS
  Filled 2013-12-15: qty 2.5

## 2013-12-15 MED ORDER — INSULIN GLARGINE 100 UNIT/ML ~~LOC~~ SOLN
46.0000 [IU] | Freq: Once | SUBCUTANEOUS | Status: AC
  Administered 2013-12-15: 46 [IU] via SUBCUTANEOUS
  Filled 2013-12-15: qty 0.46

## 2013-12-15 MED ORDER — POTASSIUM CHLORIDE 10 MEQ/100ML IV SOLN
10.0000 meq | INTRAVENOUS | Status: AC
  Administered 2013-12-15 (×2): 10 meq via INTRAVENOUS
  Filled 2013-12-15 (×2): qty 100

## 2013-12-15 MED ORDER — INSULIN ASPART 100 UNIT/ML ~~LOC~~ SOLN: 4.0000 [IU] | Freq: Three times a day (TID) | SUBCUTANEOUS | Status: DC

## 2013-12-15 MED ORDER — SODIUM CHLORIDE 0.9 % IV SOLN: INTRAVENOUS | Status: DC

## 2013-12-15 MED ORDER — VALACYCLOVIR HCL 500 MG PO TABS
500.0000 mg | ORAL_TABLET | Freq: Two times a day (BID) | ORAL | Status: DC
  Administered 2013-12-15: 500 mg via ORAL
  Filled 2013-12-15 (×3): qty 1

## 2013-12-15 MED ORDER — SODIUM CHLORIDE 0.9 % IV BOLUS (SEPSIS)
1000.0000 mL | Freq: Once | INTRAVENOUS | Status: AC
  Administered 2013-12-15: 1000 mL via INTRAVENOUS

## 2013-12-15 MED ORDER — PANTOPRAZOLE SODIUM 40 MG IV SOLR
40.0000 mg | INTRAVENOUS | Status: DC
  Administered 2013-12-15: 40 mg via INTRAVENOUS
  Filled 2013-12-15: qty 40

## 2013-12-15 MED ORDER — SODIUM CHLORIDE 0.9 % IV SOLN: INTRAVENOUS | Status: AC

## 2013-12-15 MED ORDER — DEXTROSE 50 % IV SOLN
25.0000 mL | INTRAVENOUS | Status: DC | PRN
Start: 2013-12-15 — End: 2013-12-15

## 2013-12-15 MED ORDER — NICOTINE 21 MG/24HR TD PT24
21.0000 mg | MEDICATED_PATCH | Freq: Every day | TRANSDERMAL | Status: DC
  Administered 2013-12-15: 21 mg via TRANSDERMAL
  Filled 2013-12-15: qty 1

## 2013-12-15 MED ORDER — INSULIN ASPART 100 UNIT/ML ~~LOC~~ SOLN: 0.0000 [IU] | Freq: Three times a day (TID) | SUBCUTANEOUS | Status: DC

## 2013-12-15 MED ORDER — VALACYCLOVIR HCL 500 MG PO TABS
1000.0000 mg | ORAL_TABLET | Freq: Once | ORAL | Status: AC
  Administered 2013-12-15: 1000 mg via ORAL

## 2013-12-15 MED ORDER — ENOXAPARIN SODIUM 40 MG/0.4ML ~~LOC~~ SOLN
40.0000 mg | SUBCUTANEOUS | Status: DC
  Administered 2013-12-15: 40 mg via SUBCUTANEOUS
  Filled 2013-12-15: qty 0.4

## 2013-12-15 MED ORDER — SODIUM CHLORIDE 0.9 % IV SOLN
INTRAVENOUS | Status: DC
  Administered 2013-12-15: 08:00:00 via INTRAVENOUS

## 2013-12-15 MED ORDER — INSULIN GLARGINE 100 UNIT/ML ~~LOC~~ SOLN
46.0000 [IU] | Freq: Every day | SUBCUTANEOUS | Status: DC
  Filled 2013-12-15: qty 0.46

## 2013-12-15 MED ORDER — DEXTROSE-NACL 5-0.45 % IV SOLN
INTRAVENOUS | Status: DC
  Administered 2013-12-15: 09:00:00 via INTRAVENOUS

## 2013-12-15 MED ORDER — PIPERACILLIN-TAZOBACTAM 3.375 G IVPB
INTRAVENOUS | Status: AC
  Filled 2013-12-15: qty 50

## 2013-12-15 MED ORDER — SODIUM CHLORIDE 0.9 % IV SOLN
1000.0000 mL | INTRAVENOUS | Status: DC
  Administered 2013-12-15: 1000 mL via INTRAVENOUS

## 2013-12-15 MED ORDER — SODIUM CHLORIDE 0.9 % IV SOLN
INTRAVENOUS | Status: DC
  Filled 2013-12-15: qty 2.5

## 2013-12-15 NOTE — H&P (Addendum)
[facility] Admission History and Physical       Pedro [name] YSA:[id number] DOB: 05-09-1995 DOA: 12/15/2013  Referring physician:  EDP PCP: Chevis Pretty, FNP  Specialists:   Chief Complaint: Critically High Glucose  HPI: Pedro [name] is a 18 y.o. male with Type I Diabetes Mellitus, and history of Noncompliance who presents to the ED with complaints of critically high Glucose levels this evening.  He reports that he went to work and was in a rush and forgot to take his AM dose of his Lantus.  He denies missing any other doses.   He also has complaints of a recent flare up of HSV-2 on his genitals for the past 3 days.    He denies any fevers or chills or nausea or vomiting or diarrhea.     Review of Systems:  Constitutional: No Weight Loss, No Weight Gain, Night Sweats, Fevers, Chills, Dizziness, Fatigue, or Generalized Weakness HEENT: No Headaches, Difficulty Swallowing,Tooth/Dental Problems,Sore Throat,  No Sneezing, Rhinitis, Ear Ache, Nasal Congestion, or Post Nasal Drip,  Cardio-vascular:  No Chest pain, Orthopnea, PND, Edema in Lower Extremities, Anasarca, Dizziness, Palpitations  Resp: No Dyspnea, No DOE, No Cough, No Hemoptysis, No Wheezing.    GI: No Heartburn, Indigestion, Abdominal Pain, Nausea, Vomiting, Diarrhea, Hematemesis, Hematochezia, Melena, Change in Bowel Habits,  Loss of Appetite  GU: +Penile Blisters,  No Dysuria, Change in Color of Urine, No Urgency, +Urinary Frequency, No Flank pain.  Musculoskeletal: No Joint Pain or Swelling, No Decreased Range of Motion, No Back Pain.  Neurologic: No Syncope, No Seizures, Muscle Weakness, Paresthesia, Vision Disturbance or Loss, No Diplopia, No Vertigo, No Difficulty Walking,  Skin: No Rash or Lesions. Psych: No Change in Mood or Affect, No Depression or Anxiety, No Memory loss, No Confusion, or Hallucinations   Past Medical History  Diagnosis Date  . ADHD (attention deficit hyperactivity disorder)    . DM type 1 (diabetes mellitus, type 1)     diagnosed age 17  . Herpes     Past Surgical History  Procedure Laterality Date  . None        Prior to Admission medications   Medication Sig Start Date End Date Taking? Authorizing Provider  gabapentin (NEURONTIN) 300 MG capsule Take 1 capsule (300 mg total) by mouth at bedtime. 11/01/13   Mary-Margaret Hassell Done, FNP  insulin aspart (NOVOLOG FLEXPEN) 100 UNIT/ML FlexPen Inject 5-10 Units into the skin 3 (three) times daily with meals. 10/05/13   Kathie Dike, MD  Insulin Glargine (LANTUS) 100 UNIT/ML Solostar Pen Inject 46 Units into the skin daily. 10/05/13   Kathie Dike, MD  valACYclovir (VALTREX) 1000 MG tablet Take 1 tablet (1,000 mg total) by mouth 2 (two) times daily. 11/01/13   Mary-Margaret Hassell Done, FNP    Allergies  Allergen Reactions  . Sulfa Antibiotics Hives    fever     Social History:  reports that he has been smoking Cigarettes.  He has a 2 pack-year smoking history. He does not have any smokeless tobacco history on file. He reports that he drinks alcohol. He reports that he uses illicit drugs (Marijuana, Other-see comments, Benzodiazepines, Hydrocodone, and Oxycodone).     History reviewed. No pertinent family history.     Physical Exam:  GEN:  Thin Well Developed 18 y.o. Caucasian male examined and in no acute distress; cooperative with exam Filed Vitals:   12/15/13 0152  BP: 146/95  Pulse: 88  Temp: 98.5 F (36.9 C)  TempSrc: Oral  Resp: 18  Height: 5\' 11"  (1.803 m)  Weight: 63.504 kg (140 lb)  SpO2: 99%   Blood pressure 146/95, pulse 88, temperature 98.5 F (36.9 C), temperature source Oral, resp. rate 18, height 5\' 11"  (1.803 m), weight 63.504 kg (140 lb), SpO2 99.00%. PSYCH: He is alert and oriented x4; does not appear anxious does not appear depressed; affect is normal HEENT: Normocephalic and Atraumatic, Mucous membranes pink; PERRLA; EOM intact; Fundi:  Benign;  No scleral icterus, Nares: Patent,  Oropharynx: Clear, Fair Dentition,    Neck:  FROM, No Cervical Lymphadenopathy nor Thyromegaly or Carotid Bruit; No JVD; Breasts:: Not examined CHEST WALL: No tenderness CHEST: Normal respiration, clear to auscultation bilaterally HEART: Regular rate and rhythm; no murmurs rubs or gallops BACK: No kyphosis or scoliosis; No CVA tenderness ABDOMEN: Positive Bowel Sounds, Soft Non-Tender; No Masses, No Organomegaly.   Rectal Exam: Not done EXTREMITIES: No Cyanosis, Clubbing, or Edema; No Ulcerations. Genitalia: not examined PULSES: 2+ and symmetric SKIN: Normal hydration,  +ACNE VULGARIS , No ulceration CNS:  Alert and Oriented x 4, No Focal Deficits  Vascular: pulses palpable throughout    Labs on Admission:  Basic Metabolic Panel:  Recent Labs Lab 12/15/13 0215  NA 123*  K 4.9  CL 83*  CO2 24  GLUCOSE 1003*  BUN 26*  CREATININE 0.76  CALCIUM 9.6   Liver Function Tests: No results found for this basename: AST, ALT, ALKPHOS, BILITOT, PROT, ALBUMIN,  in the last 168 hours No results found for this basename: LIPASE, AMYLASE,  in the last 168 hours No results found for this basename: AMMONIA,  in the last 168 hours CBC:  Recent Labs Lab 12/15/13 0215  WBC 9.7  NEUTROABS 5.8  HGB 16.9  HCT 51.7  MCV 99.0  PLT 256   Cardiac Enzymes: No results found for this basename: CKTOTAL, CKMB, CKMBINDEX, TROPONINI,  in the last 168 hours  BNP (last 3 results) No results found for this basename: PROBNP,  in the last 8760 hours CBG:  Recent Labs Lab 12/15/13 0206 12/15/13 0357  GLUCAP >600* >600*    Radiological Exams on Admission: No results found.    Assessment/Plan:   18 y.o. male with   Principal Problem:   1.    DKA (diabetic ketoacidoses)   DKA Protocol with IV Insulin drip   IVFs per protocol   Monitor Electrolytes   NPO while on Insulin Drip     Active Problems:   2.   Genital herpes simplex type 2    Valtrex 500mg  PO BID x 5 days for Outbreak      3.   Polysubstance dependence including opioid type drug, episodic abuse   Check UDS     4.   Tobacco abuse   Nicotine Patch daily     5.   DVT Prophylaxis   Lovenox       Code Status:  FULL CODE  Family Communication:    No Family Present Disposition Plan:     Inpatient  Time spent:  Chelan C [facility] Pager [phone]   If [place] Please Contact the Day Rounding Team MD for [facility]  If 7PM-7AM, Please Contact Night-Floor Coverage  [email or web address] Password [facility] 12/15/2013, 4:38 AM

## 2013-12-15 NOTE — ED Notes (Addendum)
Pt reports checking blood sugar about 30 min ago and monitor just read "High".  Pt reports forgetting to take dose of Lantus.  Pt also reporting herpes outbreak.

## 2013-12-15 NOTE — ED Provider Notes (Signed)
CSN: [id number]     Arrival date & time 12/15/13  0145 History   First MD Initiated Contact with Patient 12/15/13 0206     Chief Complaint  Patient presents with  . Hyperglycemia     (Consider location/radiation/quality/duration/timing/severity/associated sxs/prior Treatment) HPI Comments: Pt with hx of Type 1 DM, Herpes, comes in with cc of elevated blood sugar. States that he missed 1 dose earlier today. Reports his sugar was coming high - and so he came to the ER. Records indicate 2 DKA this year. Pt admits to polyuria and polyphagia. He denies any fevers, chills, nausea, emesis, chest pain, cough. + rash - in his penis. Pt has hx of herpes, and states that despite him taking valtrex, he has a new painful lesion to his penis. No uti like sx.  Patient is a 18 y.o. male presenting with hyperglycemia. The history is provided by the patient.  Hyperglycemia Associated symptoms: fatigue, increased thirst and polyuria   Associated symptoms: no abdominal pain, no chest pain, no confusion, no dizziness, no dysuria, no fever, no nausea, no shortness of breath and no vomiting     Past Medical History  Diagnosis Date  . ADHD (attention deficit hyperactivity disorder)   . DM type 1 (diabetes mellitus, type 1)     diagnosed age 87  . Herpes    Past Surgical History  Procedure Laterality Date  . None     History reviewed. No pertinent family history. History  Substance Use Topics  . Smoking status: Current Every Day Smoker -- 1.00 packs/day for 2 years    Types: Cigarettes  . Smokeless tobacco: Not on file  . Alcohol Use: Yes     Comment: occassional    Review of Systems  Constitutional: Positive for fatigue. Negative for fever, chills and activity change.  HENT: Negative for postnasal drip, rhinorrhea, sinus pressure and sore throat.   Eyes: Negative for visual disturbance.  Respiratory: Negative for cough, chest tightness and shortness of breath.   Cardiovascular: Negative for  chest pain.  Gastrointestinal: Negative for nausea, vomiting, abdominal pain and abdominal distention.  Endocrine: Positive for polydipsia, polyphagia and polyuria.  Genitourinary: Negative for dysuria, enuresis and difficulty urinating.  Musculoskeletal: Negative for arthralgias and neck pain.  Skin: Positive for rash.  Allergic/Immunologic: Positive for immunocompromised state.  Neurological: Negative for dizziness, light-headedness and headaches.  Psychiatric/Behavioral: Negative for confusion.      Allergies  Sulfa antibiotics  Home Medications   Prior to Admission medications   Medication Sig Start Date End Date Taking? Authorizing Provider  gabapentin (NEURONTIN) 300 MG capsule Take 1 capsule (300 mg total) by mouth at bedtime. 11/01/13   Mary-Margaret Hassell Done, FNP  insulin aspart (NOVOLOG FLEXPEN) 100 UNIT/ML FlexPen Inject 5-10 Units into the skin 3 (three) times daily with meals. 10/05/13   Kathie Dike, MD  Insulin Glargine (LANTUS) 100 UNIT/ML Solostar Pen Inject 46 Units into the skin daily. 10/05/13   Kathie Dike, MD  valACYclovir (VALTREX) 1000 MG tablet Take 1 tablet (1,000 mg total) by mouth 2 (two) times daily. 11/01/13   Mary-Margaret Hassell Done, FNP   BP 146/95  Pulse 88  Temp(Src) 98.5 F (36.9 C) (Oral)  Resp 18  Ht 5\' 11"  (1.803 m)  Wt 140 lb (63.504 kg)  BMI 19.53 kg/m2  SpO2 99% Physical Exam  Nursing note and vitals reviewed. Constitutional: He is oriented to person, place, and time. He appears well-developed.  HENT:  Head: Normocephalic and atraumatic.  Eyes: Conjunctivae and EOM are  normal. Pupils are equal, round, and reactive to light.  Neck: Normal range of motion. Neck supple.  Cardiovascular: Normal rate and regular rhythm.   Pulmonary/Chest: Effort normal and breath sounds normal.  Abdominal: Soft. Bowel sounds are normal. He exhibits no distension. There is no tenderness. There is no rebound and no guarding.  Genitourinary: Penile tenderness  present.  Ulcerated penile lesion  Neurological: He is alert and oriented to person, place, and time.  Skin: Skin is warm. Rash noted.    ED Course  Procedures (including critical care time) Labs Review Labs Reviewed  BASIC METABOLIC PANEL - Abnormal; Notable for the following:    Sodium 123 (*)    Chloride 83 (*)    Glucose, Bld 1003 (*)    BUN 26 (*)    Anion gap 16 (*)    All other components within normal limits  URINALYSIS, ROUTINE W REFLEX MICROSCOPIC - Abnormal; Notable for the following:    Specific Gravity, Urine <1.005 (*)    Glucose, UA >1000 (*)    Ketones, ur TRACE (*)    All other components within normal limits  BLOOD GAS, VENOUS - Abnormal; Notable for the following:    pH, Ven 7.352 (*)    pO2, Ven 58.9 (*)    Bicarbonate 24.7 (*)    All other components within normal limits  CBG MONITORING, ED - Abnormal; Notable for the following:    Glucose-Capillary >600 (*)    All other components within normal limits  CBG MONITORING, ED - Abnormal; Notable for the following:    Glucose-Capillary >600 (*)    All other components within normal limits  URINE CULTURE  CBC WITH DIFFERENTIAL  URINE MICROSCOPIC-ADD ON  URINE RAPID DRUG SCREEN (HOSP PERFORMED)  CBG MONITORING, ED    Imaging Review No results found.   EKG Interpretation None       CRITICAL CARE Performed by: Varney Biles   Total critical care time: 40 min  Critical care time was exclusive of separately billable procedures and treating other patients.  Critical care was necessary to treat or prevent imminent or life-threatening deterioration.  Critical care was time spent personally by me on the following activities: development of treatment plan with patient and/or surrogate as well as nursing, discussions with consultants, evaluation of patient's response to treatment, examination of patient, obtaining history from patient or surrogate, ordering and performing treatments and interventions,  ordering and review of laboratory studies, ordering and review of radiographic studies, pulse oximetry and re-evaluation of patient's condition.   MDM   Final diagnoses:  Type 1 diabetes mellitus with ketoacidosis and without coma  Genital herpes    Pt comes in with cc of elevated blood sugar. Concerns for DKA - given hx of non compliance and DKA. Pt doesn't look toxic and his vitals are reassuring.  Labs show trace ketones, mild anion gap, and blood sugar of > 1000. Pt is in mild DKA - but given his hx, and the level of hyperglycemia, we will admit for optimization and avoding deterioration.  Pt also has a penile lesion. Hx of Herpes, and likely in outbreak. Spoke with Pharmacy, as pt states he has been taking valtrex as prescribed. They recommend continuing valtrex and adding lycine 1000 mg qid with good hydration. Cant find the order for inpatient lycine. Will admit  Varney Biles, MD 12/15/13 [phone]

## 2013-12-15 NOTE — Discharge Summary (Signed)
This patient was admitted with diabetic ketoacidosis. His gap had closed and he was no longer acidotic and was transitioned over to subcutaneous insulin. Received a call from patient's nurse stated that patient had decided to leave AMA. He left without any prescriptions.  Domingo Mend, MD [facility] Pager: [phone]

## 2013-12-15 NOTE — ED Notes (Signed)
hospitalist in with pt at this time 

## 2013-12-15 NOTE — ED Notes (Signed)
Report given to Judeen Hammans, RN in ICU

## 2013-12-15 NOTE — ED Notes (Addendum)
CRITICAL VALUE ALERT  Critical value received: glucose 1003  Date of notification:  12/15/13  Time of notification:  0321  Critical value read back:Yes.    Nurse who received alert:  Fabio Neighbors RN  MD notified (1st page):  [phone] (EDP in with pt suturing and unavailable)  Time of first page:

## 2013-12-17 LAB — URINE CULTURE
COLONY COUNT: NO GROWTH
CULTURE: NO GROWTH

## 2013-12-19 NOTE — Progress Notes (Signed)
UR chart review completed.  

## 2013-12-20 ENCOUNTER — Other Ambulatory Visit: Payer: Self-pay

## 2013-12-27 ENCOUNTER — Other Ambulatory Visit: Payer: Self-pay | Admitting: Pharmacist

## 2013-12-27 ENCOUNTER — Telehealth: Payer: Self-pay | Admitting: Pharmacist

## 2013-12-27 MED ORDER — INSULIN GLARGINE 100 UNIT/ML SOLOSTAR PEN: PEN_INJECTOR | SUBCUTANEOUS | Status: DC

## 2013-12-27 NOTE — Telephone Encounter (Signed)
Received communication from patient's [facility] that patient was running out of Lantus before insurance would allow refills.   Per their report patient is priming insulin pen with 4 to 6 units before each dose.  This is not necessary - 1 unit would be enough unless pen in malfunctioning.  Will send in Rx and make appt with patient to follow up for diabetes education and injection technique review due to poorly controlled DM. Tried to call patient - left message with his mother

## 2013-12-31 NOTE — Telephone Encounter (Signed)
Left message on voicemail about appointment and concerns regarding use of insulin.

## 2014-01-02 ENCOUNTER — Ambulatory Visit: Payer: Self-pay

## 2014-01-17 ENCOUNTER — Telehealth: Payer: Self-pay | Admitting: Pharmacist

## 2014-01-17 NOTE — Telephone Encounter (Signed)
Again tried to contact patient regarding insulin adminsitration and uncontrolled diabetes.   Patient is in need of education about diabetes and needs review of proper insulin administration.  Unable to reach patient - left message on VM to contact office for appt.

## 2014-02-14 ENCOUNTER — Emergency Department (HOSPITAL_COMMUNITY)
Admission: EM | Admit: 2014-02-14 | Discharge: 2014-02-15 | Disposition: A | Discharge status: Home / Self Care | Payer: Medicaid Other | Attending: Emergency Medicine | Admitting: Emergency Medicine

## 2014-02-14 ENCOUNTER — Encounter (HOSPITAL_COMMUNITY): Payer: Self-pay | Admitting: Emergency Medicine

## 2014-02-14 DIAGNOSIS — Z8619 Personal history of other infectious and parasitic diseases: Secondary | ICD-10-CM | POA: Diagnosis not present

## 2014-02-14 DIAGNOSIS — R739 Hyperglycemia, unspecified: Secondary | ICD-10-CM

## 2014-02-14 DIAGNOSIS — Z8659 Personal history of other mental and behavioral disorders: Secondary | ICD-10-CM | POA: Insufficient documentation

## 2014-02-14 DIAGNOSIS — Z720 Tobacco use: Secondary | ICD-10-CM | POA: Insufficient documentation

## 2014-02-14 DIAGNOSIS — Z794 Long term (current) use of insulin: Secondary | ICD-10-CM | POA: Diagnosis not present

## 2014-02-14 DIAGNOSIS — E1065 Type 1 diabetes mellitus with hyperglycemia: Secondary | ICD-10-CM | POA: Insufficient documentation

## 2014-02-14 DIAGNOSIS — Z79899 Other long term (current) drug therapy: Secondary | ICD-10-CM | POA: Insufficient documentation

## 2014-02-14 LAB — COMPREHENSIVE METABOLIC PANEL
ALT: 9 U/L (ref 0–53)
ANION GAP: 13 (ref 5–15)
AST: 8 U/L (ref 0–37)
Albumin: 3.8 g/dL (ref 3.5–5.2)
Alkaline Phosphatase: 88 U/L (ref 39–117)
BUN: 15 mg/dL (ref 6–23)
CALCIUM: 9.2 mg/dL (ref 8.4–10.5)
CO2: 27 mEq/L (ref 19–32)
CREATININE: 0.8 mg/dL (ref 0.50–1.35)
Chloride: 92 mEq/L — ABNORMAL LOW (ref 96–112)
GLUCOSE: 628 mg/dL — AB (ref 70–99)
Potassium: 4.6 mEq/L (ref 3.7–5.3)
Sodium: 132 mEq/L — ABNORMAL LOW (ref 137–147)
TOTAL PROTEIN: 6.8 g/dL (ref 6.0–8.3)
Total Bilirubin: 0.4 mg/dL (ref 0.3–1.2)

## 2014-02-14 LAB — URINE MICROSCOPIC-ADD ON

## 2014-02-14 LAB — KETONES, QUALITATIVE: Acetone, Bld: NEGATIVE

## 2014-02-14 LAB — CBC
HEMATOCRIT: 47.2 % (ref 39.0–52.0)
Hemoglobin: 16.3 g/dL (ref 13.0–17.0)
MCH: 31.7 pg (ref 26.0–34.0)
MCHC: 34.5 g/dL (ref 30.0–36.0)
MCV: 91.7 fL (ref 78.0–100.0)
Platelets: 228 10*3/uL (ref 150–400)
RBC: 5.15 MIL/uL (ref 4.22–5.81)
RDW: 11.9 % (ref 11.5–15.5)
WBC: 9.4 10*3/uL (ref 4.0–10.5)

## 2014-02-14 LAB — URINALYSIS, ROUTINE W REFLEX MICROSCOPIC
Bilirubin Urine: NEGATIVE
Glucose, UA: >1000 mg/dL — AB
HGB URINE DIPSTICK: NEGATIVE
Ketones, ur: NEGATIVE mg/dL
Leukocytes, UA: NEGATIVE
NITRITE: NEGATIVE
PROTEIN: NEGATIVE mg/dL
Specific Gravity, Urine: 1.01 (ref 1.005–1.030)
Urobilinogen, UA: 0.2 mg/dL (ref 0.0–1.0)
pH: 6 (ref 5.0–8.0)

## 2014-02-14 LAB — CBG MONITORING, ED: Glucose-Capillary: 545 mg/dL — ABNORMAL HIGH (ref 70–99)

## 2014-02-14 MED ORDER — SODIUM CHLORIDE 0.9 % IV BOLUS (SEPSIS)
1000.0000 mL | Freq: Once | INTRAVENOUS | Status: AC
Start: 2014-02-14 — End: 2014-02-14
  Administered 2014-02-14: 1000 mL via INTRAVENOUS

## 2014-02-14 MED ORDER — INSULIN ASPART 100 UNIT/ML ~~LOC~~ SOLN
10.0000 [IU] | Freq: Once | SUBCUTANEOUS | Status: AC
  Administered 2014-02-14: 10 [IU] via INTRAVENOUS
  Filled 2014-02-14: qty 1

## 2014-02-14 MED ORDER — SODIUM CHLORIDE 0.9 % IV SOLN
1000.0000 mL | Freq: Once | INTRAVENOUS | Status: AC
  Administered 2014-02-15: 1000 mL via INTRAVENOUS

## 2014-02-14 MED ORDER — SODIUM CHLORIDE 0.9 % IV SOLN
1000.0000 mL | INTRAVENOUS | Status: DC
  Administered 2014-02-15: 1000 mL via INTRAVENOUS

## 2014-02-14 MED ORDER — SODIUM CHLORIDE 0.9 % IV SOLN
1000.0000 mL | Freq: Once | INTRAVENOUS | Status: AC
  Administered 2014-02-14: 2000 mL via INTRAVENOUS

## 2014-02-14 NOTE — ED Provider Notes (Signed)
CSN: [id number]     Arrival date & time 02/14/14  2204 History  This chart was scribed for Delora Fuel, MD by Edison Simon, ED Scribe. This patient was seen in room APA14/APA14 and the patient's care was started at 11:31 PM.    Chief Complaint  Patient presents with  . Hyperglycemia   The history is provided by the patient. No language interpreter was used.    HPI Comments: Pedro [name] is a 18 y.o. male with history of diabetes, herpes, and chlamydia who presents to the Emergency Department complaining of hyperglycemia, spiking 3 hours ago. He states he started feeling dehydrated and checked his blood guar and found it to be 500-600. He stated he took 15 units of Novolog at home but his blood sugar has remained high, it measured here at 628. He reports generalized malaise, using the bathroom frequently, drinking more water, and nausea. He states his sugars have been running 215 at the highest before today. He notes diagnosis of herpes a few months ago for which he has been using medication; he also reports diagnosis of chlamydia 2 days ago for which he has been receiving treatment. He denies vomiting.  Past Medical History  Diagnosis Date  . ADHD (attention deficit hyperactivity disorder)   . DM type 1 (diabetes mellitus, type 1)     diagnosed age 5  . Herpes    Past Surgical History  Procedure Laterality Date  . None     History reviewed. No pertinent family history. History  Substance Use Topics  . Smoking status: Current Every Day Smoker -- 0.50 packs/day for 2 years    Types: Cigarettes  . Smokeless tobacco: Current User  . Alcohol Use: Yes     Comment: occassional    Review of Systems  Constitutional:       Positive malaise  Gastrointestinal: Positive for nausea. Negative for vomiting.  Endocrine: Positive for polydipsia.  Genitourinary: Positive for frequency.  All other systems reviewed and are negative.     Allergies  Sulfa antibiotics  Home Medications    Prior to Admission medications   Medication Sig Start Date End Date Taking? Authorizing Provider  gabapentin (NEURONTIN) 300 MG capsule Take 1 capsule (300 mg total) by mouth at bedtime. 11/01/13  Yes Mary-Margaret Hassell Done, FNP  insulin aspart (NOVOLOG FLEXPEN) 100 UNIT/ML FlexPen Inject 5-10 Units into the skin 3 (three) times daily with meals. 10/05/13  Yes Kathie Dike, MD  Insulin Glargine (LANTUS) 100 UNIT/ML Solostar Pen Inject 48 units + 1 units as needed to prime pen (total of 49 units) once daily. 12/27/13  Yes Tammy Eckard, PHARMD  UNKNOWN TO PATIENT Take 1 capsule by mouth 2 (two) times daily.   Yes Historical Provider, MD  valACYclovir (VALTREX) 1000 MG tablet Take 1 tablet (1,000 mg total) by mouth 2 (two) times daily. 11/01/13  Yes Mary-Margaret Hassell Done, FNP   BP 132/82 mmHg  Pulse 128  Temp(Src) 99.1 F (37.3 C) (Oral)  Resp 20  Ht 5\' 11"  (1.803 m)  Wt 144 lb (65.318 kg)  BMI 20.09 kg/m2  SpO2 98% Physical Exam  Constitutional: He is oriented to person, place, and time. He appears well-developed and well-nourished.  HENT:  Head: Normocephalic and atraumatic.  Eyes: Conjunctivae are normal. Pupils are equal, round, and reactive to light.  Neck: Normal range of motion. Neck supple. No JVD present.  Cardiovascular: Normal rate, regular rhythm and normal heart sounds.   No murmur heard. Pulmonary/Chest: Effort normal and breath sounds  normal. No respiratory distress. He has no wheezes. He has no rales.  Abdominal: Soft. Bowel sounds are normal. He exhibits no distension and no mass. There is no tenderness.  Musculoskeletal: Normal range of motion. He exhibits no edema.  Lymphadenopathy:    He has no cervical adenopathy.  Neurological: He is alert and oriented to person, place, and time. He has normal reflexes. No cranial nerve deficit. Coordination normal.  Skin: Skin is warm and dry. No rash noted.  Psychiatric: He has a normal mood and affect. His behavior is normal.  Thought content normal.  Nursing note and vitals reviewed.   ED Course  Procedures (including critical care time)  DIAGNOSTIC STUDIES: Oxygen Saturation is 98% on room air, normal by my interpretation.    COORDINATION OF CARE: 11:36 PM Discussed treatment plan with patient at beside, the patient agrees with the plan and has no further questions at this time.   Labs Review Results for orders placed or performed during the hospital encounter of 02/14/14  CBC  Result Value Ref Range   WBC 9.4 4.0 - 10.5 K/uL   RBC 5.15 4.22 - 5.81 MIL/uL   Hemoglobin 16.3 13.0 - 17.0 g/dL   HCT 47.2 39.0 - 52.0 %   MCV 91.7 78.0 - 100.0 fL   MCH 31.7 26.0 - 34.0 pg   MCHC 34.5 30.0 - 36.0 g/dL   RDW 11.9 11.5 - 15.5 %   Platelets 228 150 - 400 K/uL  Comprehensive metabolic panel  Result Value Ref Range   Sodium 132 (L) 137 - 147 mEq/L   Potassium 4.6 3.7 - 5.3 mEq/L   Chloride 92 (L) 96 - 112 mEq/L   CO2 27 19 - 32 mEq/L   Glucose, Bld 628 (HH) 70 - 99 mg/dL   BUN 15 6 - 23 mg/dL   Creatinine, Ser 0.80 0.50 - 1.35 mg/dL   Calcium 9.2 8.4 - 10.5 mg/dL   Total Protein 6.8 6.0 - 8.3 g/dL   Albumin 3.8 3.5 - 5.2 g/dL   AST 8 0 - 37 U/L   ALT 9 0 - 53 U/L   Alkaline Phosphatase 88 39 - 117 U/L   Total Bilirubin 0.4 0.3 - 1.2 mg/dL   GFR calc non Af Amer >90 >90 mL/min   GFR calc Af Amer >90 >90 mL/min   Anion gap 13 5 - 15  Urinalysis, Routine w reflex microscopic  Result Value Ref Range   Color, Urine YELLOW YELLOW   APPearance CLEAR CLEAR   Specific Gravity, Urine 1.010 1.005 - 1.030   pH 6.0 5.0 - 8.0   Glucose, UA >1000 (A) NEGATIVE mg/dL   Hgb urine dipstick NEGATIVE NEGATIVE   Bilirubin Urine NEGATIVE NEGATIVE   Ketones, ur NEGATIVE NEGATIVE mg/dL   Protein, ur NEGATIVE NEGATIVE mg/dL   Urobilinogen, UA 0.2 0.0 - 1.0 mg/dL   Nitrite NEGATIVE NEGATIVE   Leukocytes, UA NEGATIVE NEGATIVE  Ketones, qualitative  Result Value Ref Range   Acetone, Bld NEGATIVE NEGATIVE  Urine  microscopic-add on  Result Value Ref Range   WBC, UA 0-2 <3 WBC/hpf  CBG monitoring, ED  Result Value Ref Range   Glucose-Capillary 545 (H) 70 - 99 mg/dL   Comment 1 Notify RN   POC CBG, ED  Result Value Ref Range   Glucose-Capillary 213 (H) 70 - 99 mg/dL   MDM   Final diagnoses:  Hyperglycemia    Hyperglycemia which responded well to IV fluids and IV insulin. He is  discharged with instructions to monitor his blood sugar closely.  I personally performed the services described in this documentation, which was scribed in my presence. The recorded information has been reviewed and is accurate.     Delora Fuel, MD 20/23/34 [id number]

## 2014-02-14 NOTE — ED Notes (Signed)
CRITICAL VALUE ALERT  Critical value received:  Glucose 628  Date of notification:  02/14/14  Time of notification:  2324  Critical value read back:Yes.    Nurse who received alert:  Michaelyn Barter  MD notified (1st page):  2324  Time of first page:  2324  MD notified (2nd page):  Time of second page:  Responding MD:  Roxanne Mins  Time MD responded:  2324

## 2014-02-14 NOTE — ED Notes (Signed)
Patient states gave himself 15 units of novolog approximately an hour ago, and 5 units of novolog approximately 30 minutes ago.

## 2014-02-14 NOTE — ED Notes (Signed)
Patient reports he is diabetic and blood sugar is elevated tonight. States blood sugar was over 500 tonight.

## 2014-02-15 LAB — CBG MONITORING, ED: Glucose-Capillary: 213 mg/dL — ABNORMAL HIGH (ref 70–99)

## 2014-02-15 NOTE — Discharge Instructions (Signed)

## 2014-02-15 NOTE — ED Notes (Signed)
Discharge instructions given, pt demonstrated teach back and verbal understanding. No concerns voiced.  

## 2014-02-16 ENCOUNTER — Encounter (HOSPITAL_COMMUNITY): Payer: Self-pay

## 2014-02-16 ENCOUNTER — Inpatient Hospital Stay (HOSPITAL_COMMUNITY)
Admission: EM | Admit: 2014-02-16 | Discharge: 2014-02-18 | DRG: 639 | Discharge status: Against Medical Advice | Payer: Medicaid Other | Attending: Family Medicine | Admitting: Family Medicine

## 2014-02-16 ENCOUNTER — Emergency Department (HOSPITAL_COMMUNITY): Payer: Medicaid Other

## 2014-02-16 DIAGNOSIS — K529 Noninfective gastroenteritis and colitis, unspecified: Secondary | ICD-10-CM

## 2014-02-16 DIAGNOSIS — E1040 Type 1 diabetes mellitus with diabetic neuropathy, unspecified: Secondary | ICD-10-CM | POA: Diagnosis present

## 2014-02-16 DIAGNOSIS — F909 Attention-deficit hyperactivity disorder, unspecified type: Secondary | ICD-10-CM | POA: Diagnosis present

## 2014-02-16 DIAGNOSIS — F17210 Nicotine dependence, cigarettes, uncomplicated: Secondary | ICD-10-CM | POA: Diagnosis present

## 2014-02-16 DIAGNOSIS — Z882 Allergy status to sulfonamides status: Secondary | ICD-10-CM

## 2014-02-16 DIAGNOSIS — E1010 Type 1 diabetes mellitus with ketoacidosis without coma: Principal | ICD-10-CM

## 2014-02-16 DIAGNOSIS — Z794 Long term (current) use of insulin: Secondary | ICD-10-CM | POA: Diagnosis not present

## 2014-02-16 DIAGNOSIS — R1110 Vomiting, unspecified: Secondary | ICD-10-CM | POA: Diagnosis not present

## 2014-02-16 DIAGNOSIS — R079 Chest pain, unspecified: Secondary | ICD-10-CM

## 2014-02-16 HISTORY — DX: Noninfective gastroenteritis and colitis, unspecified: K52.9

## 2014-02-16 LAB — URINALYSIS, ROUTINE W REFLEX MICROSCOPIC
Bilirubin Urine: NEGATIVE
GLUCOSE, UA: 500 mg/dL — AB
Leukocytes, UA: NEGATIVE
Nitrite: NEGATIVE
Protein, ur: 30 mg/dL — AB
Specific Gravity, Urine: 1.025 (ref 1.005–1.030)
Urobilinogen, UA: 0.2 mg/dL (ref 0.0–1.0)
pH: 5.5 (ref 5.0–8.0)

## 2014-02-16 LAB — BASIC METABOLIC PANEL
Anion gap: 29 — ABNORMAL HIGH (ref 5–15)
Anion gap: 26 — ABNORMAL HIGH (ref 5–15)
BUN: 24 mg/dL — AB (ref 6–23)
BUN: 21 mg/dL (ref 6–23)
CHLORIDE: 102 meq/L (ref 96–112)
CO2: 8 mEq/L — CL (ref 19–32)
CO2: 7 mEq/L — CL (ref 19–32)
Calcium: 8.3 mg/dL — ABNORMAL LOW (ref 8.4–10.5)
Calcium: 8.4 mg/dL (ref 8.4–10.5)
Chloride: 97 mEq/L (ref 96–112)
Creatinine, Ser: 0.81 mg/dL (ref 0.50–1.35)
Creatinine, Ser: 0.81 mg/dL (ref 0.50–1.35)
GFR calc Af Amer: >90 mL/min (ref >90)
GFR calc non Af Amer: >90 mL/min (ref >90)
GLUCOSE: 437 mg/dL — AB (ref 70–99)
Glucose, Bld: 281 mg/dL — ABNORMAL HIGH (ref 70–99)
POTASSIUM: 6.3 meq/L — AB (ref 3.7–5.3)
Potassium: 5.4 mEq/L — ABNORMAL HIGH (ref 3.7–5.3)
Sodium: 134 mEq/L — ABNORMAL LOW (ref 137–147)
Sodium: 135 mEq/L — ABNORMAL LOW (ref 137–147)

## 2014-02-16 LAB — CBC WITH DIFFERENTIAL/PLATELET
Basophils Absolute: 0 10*3/uL (ref 0.0–0.1)
Basophils Relative: 0 % (ref 0–1)
EOS ABS: 0 10*3/uL (ref 0.0–0.7)
Eosinophils Relative: 0 % (ref 0–5)
HEMATOCRIT: 47.6 % (ref 39.0–52.0)
Hemoglobin: 16 g/dL (ref 13.0–17.0)
Lymphocytes Relative: 9 % — ABNORMAL LOW (ref 12–46)
Lymphs Abs: 2.3 10*3/uL (ref 0.7–4.0)
MCH: 31.2 pg (ref 26.0–34.0)
MCHC: 33.6 g/dL (ref 30.0–36.0)
MCV: 92.8 fL (ref 78.0–100.0)
MONO ABS: 1.3 10*3/uL — AB (ref 0.1–1.0)
Monocytes Relative: 5 % (ref 3–12)
NEUTROS ABS: 22.2 10*3/uL — AB (ref 1.7–7.7)
Neutrophils Relative %: 86 % — ABNORMAL HIGH (ref 43–77)
Platelets: 245 10*3/uL (ref 150–400)
RBC: 5.13 MIL/uL (ref 4.22–5.81)
RDW: 12 % (ref 11.5–15.5)
WBC: 25.8 10*3/uL — ABNORMAL HIGH (ref 4.0–10.5)

## 2014-02-16 LAB — GLUCOSE, CAPILLARY
GLUCOSE-CAPILLARY: 323 mg/dL — AB (ref 70–99)
GLUCOSE-CAPILLARY: 263 mg/dL — AB (ref 70–99)
GLUCOSE-CAPILLARY: 208 mg/dL — AB (ref 70–99)
GLUCOSE-CAPILLARY: 185 mg/dL — AB (ref 70–99)
Glucose-Capillary: 361 mg/dL — ABNORMAL HIGH (ref 70–99)

## 2014-02-16 LAB — BLOOD GAS, VENOUS
Acid-base deficit: 21.1 mmol/L — ABNORMAL HIGH (ref 0.0–2.0)
BICARBONATE: 7.6 meq/L — AB (ref 20.0–24.0)
FIO2: 0.21 %
O2 Saturation: 86.5 %
PH VEN: 7.021 — AB (ref 7.250–7.300)
PO2 VEN: 67.3 mmHg — AB (ref 30.0–45.0)
Patient temperature: 37
TCO2: 31 mmol/L (ref 0–100)
pCO2, Ven: 31 mmHg — ABNORMAL LOW (ref 45.0–50.0)

## 2014-02-16 LAB — RAPID URINE DRUG SCREEN, HOSP PERFORMED
Amphetamines: NOT DETECTED
BENZODIAZEPINES: NOT DETECTED
Barbiturates: NOT DETECTED
COCAINE: NOT DETECTED
OPIATES: NOT DETECTED
Tetrahydrocannabinol: POSITIVE — AB

## 2014-02-16 LAB — LIPASE, BLOOD: Lipase: 10 U/L — ABNORMAL LOW (ref 11–59)

## 2014-02-16 LAB — ETHANOL

## 2014-02-16 LAB — I-STAT CG4 LACTIC ACID, ED: Lactic Acid, Venous: 3.31 mmol/L — ABNORMAL HIGH (ref 0.5–2.2)

## 2014-02-16 LAB — MRSA PCR SCREENING: MRSA by PCR: NEGATIVE

## 2014-02-16 LAB — CBG MONITORING, ED
GLUCOSE-CAPILLARY: 364 mg/dL — AB (ref 70–99)
Glucose-Capillary: 473 mg/dL — ABNORMAL HIGH (ref 70–99)

## 2014-02-16 LAB — URINE MICROSCOPIC-ADD ON

## 2014-02-16 MED ORDER — SODIUM CHLORIDE 0.9 % IV SOLN: INTRAVENOUS | Status: AC

## 2014-02-16 MED ORDER — PROMETHAZINE HCL 25 MG/ML IJ SOLN
25.0000 mg | Freq: Four times a day (QID) | INTRAMUSCULAR | Status: DC | PRN
  Administered 2014-02-17 (×2): 25 mg via INTRAVENOUS
  Filled 2014-02-16 (×2): qty 1

## 2014-02-16 MED ORDER — GABAPENTIN 300 MG PO CAPS
300.0000 mg | ORAL_CAPSULE | Freq: Every day | ORAL | Status: DC
  Administered 2014-02-16 – 2014-02-17 (×2): 300 mg via ORAL
  Filled 2014-02-16: qty 1

## 2014-02-16 MED ORDER — DEXTROSE-NACL 5-0.45 % IV SOLN: INTRAVENOUS | Status: DC

## 2014-02-16 MED ORDER — PROMETHAZINE HCL 12.5 MG PO TABS: 25.0000 mg | ORAL_TABLET | Freq: Four times a day (QID) | ORAL | Status: DC | PRN

## 2014-02-16 MED ORDER — NICOTINE 14 MG/24HR TD PT24
14.0000 mg | MEDICATED_PATCH | Freq: Every day | TRANSDERMAL | Status: DC
  Administered 2014-02-16 – 2014-02-18 (×3): 14 mg via TRANSDERMAL
  Filled 2014-02-16 (×3): qty 1

## 2014-02-16 MED ORDER — PROMETHAZINE HCL 25 MG/ML IJ SOLN
INTRAMUSCULAR | Status: AC
  Filled 2014-02-16: qty 1

## 2014-02-16 MED ORDER — PROMETHAZINE HCL 25 MG/ML IJ SOLN
25.0000 mg | Freq: Once | INTRAMUSCULAR | Status: AC
Start: 2014-02-16 — End: 2014-02-16
  Administered 2014-02-16: 25 mg via INTRAVENOUS

## 2014-02-16 MED ORDER — PROMETHAZINE HCL 25 MG RE SUPP: 25.0000 mg | Freq: Four times a day (QID) | RECTAL | Status: DC | PRN

## 2014-02-16 MED ORDER — SODIUM CHLORIDE 0.9 % IV SOLN: INTRAVENOUS | Status: DC

## 2014-02-16 MED ORDER — SODIUM CHLORIDE 0.9 % IV SOLN
INTRAVENOUS | Status: DC
  Administered 2014-02-17: 0.9 [IU]/h via INTRAVENOUS
  Filled 2014-02-16 (×4): qty 2.5

## 2014-02-16 MED ORDER — SODIUM CHLORIDE 0.9 % IV BOLUS (SEPSIS)
2000.0000 mL | Freq: Once | INTRAVENOUS | Status: AC
  Administered 2014-02-16: 2000 mL via INTRAVENOUS

## 2014-02-16 MED ORDER — SODIUM CHLORIDE 0.9 % IV SOLN
INTRAVENOUS | Status: DC
  Administered 2014-02-16: 3 [IU]/h via INTRAVENOUS
  Filled 2014-02-16: qty 2.5

## 2014-02-16 MED ORDER — DEXTROSE-NACL 5-0.45 % IV SOLN
INTRAVENOUS | Status: DC
  Administered 2014-02-16: 22:00:00 via INTRAVENOUS
  Administered 2014-02-17 – 2014-02-18 (×2): 1000 mL via INTRAVENOUS

## 2014-02-16 MED ORDER — SODIUM CHLORIDE 0.9 % IV SOLN
INTRAVENOUS | Status: DC
  Administered 2014-02-16: 18:00:00 via INTRAVENOUS

## 2014-02-16 MED ORDER — ONDANSETRON HCL 4 MG/2ML IJ SOLN
4.0000 mg | Freq: Once | INTRAMUSCULAR | Status: AC
  Administered 2014-02-16: 4 mg via INTRAVENOUS
  Filled 2014-02-16: qty 2

## 2014-02-16 MED ORDER — ENOXAPARIN SODIUM 40 MG/0.4ML ~~LOC~~ SOLN
40.0000 mg | SUBCUTANEOUS | Status: DC
  Administered 2014-02-16: 40 mg via SUBCUTANEOUS
  Filled 2014-02-16 (×2): qty 0.4

## 2014-02-16 NOTE — ED Notes (Signed)
CRITICAL VALUE ALERT  Critical value received: CO2 8  Date of notification: 02/16/14  Time of notification:  8099  Critical value read back:Yes.    Nurse who received alert:  Eilene Ghazi  MD notified (1st page): Dr Zenia Resides       Responding MD:  Dr. Zenia Resides  Time MD responded: [phone]

## 2014-02-16 NOTE — ED Notes (Signed)
Attempted a second IV access times 2 without success.

## 2014-02-16 NOTE — H&P (Addendum)
History and Physical  Pedro [name] CBU:[id number] DOB: March 07, 1996 DOA: 02/16/2014  Referring physician: Dr Zenia Resides, ED physician PCP: Chevis Pretty, FNP   Chief Complaint: Vomiting  HPI: Pedro [name] is a 18 y.o. male  With type 1 diabetes and neuropathy who presents emergency department with onset of vomiting stomach contents since this morning with accompanied chills and subjective fever. The patient has not tolerated any oral food or liquid. No pale eating or provoking factors other than eating.  His glucometer read that his blood sugars were high and patient came to the hospital where he was found to be in DKA. He reports no sick contacts or eating abnormal foods yesterday. His had very little to eat today. Last time he took his insulin was last night. Reports that his blood sugars were in the low 100s.   Review of Systems:   Pt complains of nausea, vomiting, mild epigastric and left upper quadrant abdominal pain. Fevers, chills.  Pt denies any cough, wheezing, shortness of breath, chest pain, diarrhea, constipation, lightheadedness, dizziness.  Review of systems are otherwise negative  Past Medical History  Diagnosis Date  . ADHD (attention deficit hyperactivity disorder)   . DM type 1 (diabetes mellitus, type 1)     diagnosed age 4  . Herpes    Past Surgical History  Procedure Laterality Date  . None     Social History:  reports that he has been smoking Cigarettes.  He has a 1 pack-year smoking history. He uses smokeless tobacco. He reports that he drinks alcohol. He reports that he uses illicit drugs (Marijuana, Other-see comments, Benzodiazepines, Hydrocodone, and Oxycodone). Patient lives at home & is able to participate in activities of daily living  Allergies  Allergen Reactions  . Sulfa Antibiotics Hives    fever    No family history on file.   Prior to Admission medications   Medication Sig Start Date End Date Taking? Authorizing Provider    gabapentin (NEURONTIN) 300 MG capsule Take 1 capsule (300 mg total) by mouth at bedtime. 11/01/13   Mary-Margaret Hassell Done, FNP  insulin aspart (NOVOLOG FLEXPEN) 100 UNIT/ML FlexPen Inject 5-10 Units into the skin 3 (three) times daily with meals. 10/05/13   Kathie Dike, MD  Insulin Glargine (LANTUS) 100 UNIT/ML Solostar Pen Inject 48 units + 1 units as needed to prime pen (total of 49 units) once daily. 12/27/13   Tammy Eckard, PHARMD  UNKNOWN TO PATIENT Take 1 capsule by mouth 2 (two) times daily.    Historical Provider, MD  valACYclovir (VALTREX) 1000 MG tablet Take 1 tablet (1,000 mg total) by mouth 2 (two) times daily. 11/01/13   Mary-Margaret Hassell Done, FNP    Physical Exam: BP 128/66 mmHg  Pulse 121  Temp(Src) 97.4 F (36.3 C) (Oral)  Resp 16  Ht 5\' 11"  (1.803 m)  Wt 65.318 kg (144 lb)  BMI 20.09 kg/m2  SpO2 99%  General: Young Caucasian male. Awake and alert and oriented x3. No acute cardiopulmonary distress.  Eyes: Pupils equal, round, reactive to light. Extraocular muscles are intact. Sclerae anicteric and noninjected.  ENT: Dry mucosal membranes. No mucosal lesions. Teeth in moderate repair  Neck: Neck supple without lymphadenopathy. No carotid bruits. No masses palpated.  Cardiovascular: Regular rate with normal S1-S2 sounds. No murmurs, rubs, gallops auscultated. No JVD.  Respiratory: Good respiratory effort with no wheezes, rales, rhonchi. Lungs clear to auscultation bilaterally.  Abdomen: Soft, mild epigastric and left upper quadrant tenderness, nondistended. Active bowel sounds. No masses or hepatosplenomegaly  Skin: Dry, warm to touch. 2+ dorsalis pedis and radial pulses. Musculoskeletal: No calf or leg pain. All major joints not erythematous nontender.  Psychiatric: Intact judgment and insight.  Neurologic: No focal neurological deficits. Cranial nerves II through XII are grossly intact.           Labs on Admission:  Basic Metabolic Panel:  Recent Labs Lab  02/14/14 2245 02/16/14 1703  NA 132* 134*  K 4.6 6.3*  CL 92* 97  CO2 27 8*  GLUCOSE 628* 437*  BUN 15 24*  CREATININE 0.80 0.81  CALCIUM 9.2 8.3*   Liver Function Tests:  Recent Labs Lab 02/14/14 2245  AST 8  ALT 9  ALKPHOS 88  BILITOT 0.4  PROT 6.8  ALBUMIN 3.8    Recent Labs Lab 02/16/14 1703  LIPASE 10*   No results for input(s): AMMONIA in the last 168 hours. CBC:  Recent Labs Lab 02/14/14 2245 02/16/14 1703  WBC 9.4 25.8*  NEUTROABS  --  22.2*  HGB 16.3 16.0  HCT 47.2 47.6  MCV 91.7 92.8  PLT 228 245   Cardiac Enzymes: No results for input(s): CKTOTAL, CKMB, CKMBINDEX, TROPONINI in the last 168 hours.  BNP (last 3 results) No results for input(s): PROBNP in the last 8760 hours. CBG:  Recent Labs Lab 02/14/14 2227 02/15/14 0058 02/16/14 1636 02/16/14 1820  GLUCAP 545* 213* 473* 364*    Radiological Exams on Admission: No results found.   Assessment/Plan Present on Admission:  . DKA, type 1 . Gastroenteritis  #1 DKA We'll admit to stepdown and continue on insulin. Will recheck asymmetric metabolic panels every 4 hours and capillary blood glucose levels every hour. As the patient's potassium is 6.5, no need for additional potassium at this time. We'll transition the patient to D5 half-normal saline when blood sugar reaches 250.    #2 gastroenteritis - viral Clear liquids, Phenergan  DVT prophylaxis: Lovenox  Consultants: None  Code Status: Full  Family Communication: None   Disposition Plan: Home following resolution  Time spent: 50 minutes  Loma Boston, DO [facility] Pager [phone]

## 2014-02-16 NOTE — ED Provider Notes (Signed)
CSN: [id number]     Arrival date & time 02/16/14  1633 History  This chart was scribed for Leota Jacobsen, MD by Lowella Petties, ED Scribe. The patient was seen in room APA02/APA02. Patient's care was started at 4:55 PM.   Chief Complaint  Patient presents with  . Hyperglycemia   The history is provided by the patient. No language interpreter was used.  HPI Comments: Pedro [name] is a 18 y.o. male with a history of DM type 1 who was brought by EMS to the Emergency Department complaining of elevated blood sugar. He reports, constant, generalized, body aches. He reports vomiting 20-25 times today. He notes diffuse abdominal pain. He states that he did not take his dose of Lantis today. He reports that he was seen here yesterday for the same problem and sent home once his sugar was under control.  He reports a history of ketoacidosis, and states that these symptoms feel similar. He states that he sees Dr. Hassell Done for his DM. He denies any recent alcohol or drug use.    Past Medical History  Diagnosis Date  . ADHD (attention deficit hyperactivity disorder)   . DM type 1 (diabetes mellitus, type 1)     diagnosed age 45  . Herpes    Past Surgical History  Procedure Laterality Date  . None     No family history on file. History  Substance Use Topics  . Smoking status: Current Every Day Smoker -- 0.50 packs/day for 2 years    Types: Cigarettes  . Smokeless tobacco: Current User  . Alcohol Use: Yes     Comment: occassional- denies    Review of Systems  Constitutional:       Elevated BS  Musculoskeletal: Positive for arthralgias (generalized body aches).  All other systems reviewed and are negative.   Allergies  Sulfa antibiotics  Home Medications   Prior to Admission medications   Medication Sig Start Date End Date Taking? Authorizing Provider  gabapentin (NEURONTIN) 300 MG capsule Take 1 capsule (300 mg total) by mouth at bedtime. 11/01/13   Mary-Margaret Hassell Done, FNP   insulin aspart (NOVOLOG FLEXPEN) 100 UNIT/ML FlexPen Inject 5-10 Units into the skin 3 (three) times daily with meals. 10/05/13   Kathie Dike, MD  Insulin Glargine (LANTUS) 100 UNIT/ML Solostar Pen Inject 48 units + 1 units as needed to prime pen (total of 49 units) once daily. 12/27/13   Tammy Eckard, PHARMD  UNKNOWN TO PATIENT Take 1 capsule by mouth 2 (two) times daily.    Historical Provider, MD  valACYclovir (VALTREX) 1000 MG tablet Take 1 tablet (1,000 mg total) by mouth 2 (two) times daily. 11/01/13   Mary-Margaret Hassell Done, FNP   Triage Vitals: BP 128/64 mmHg  Pulse 113  Temp(Src) 97.4 F (36.3 C) (Oral)  Resp 16  Ht 5\' 11"  (1.803 m)  Wt 144 lb (65.318 kg)  BMI 20.09 kg/m2  SpO2 100% Physical Exam  Constitutional: He is oriented to person, place, and time. He appears well-developed and well-nourished.  Non-toxic appearance. No distress.  HENT:  Head: Normocephalic and atraumatic.  Mucus membranes are dry  Eyes: Conjunctivae, EOM and lids are normal. Pupils are equal, round, and reactive to light.  Neck: Normal range of motion. Neck supple. No tracheal deviation present. No thyroid mass present.  Cardiovascular: Regular rhythm and normal heart sounds.  Tachycardia present.  Exam reveals no gallop.   No murmur heard. Pulmonary/Chest: Effort normal and breath sounds normal. No stridor. No  respiratory distress. He has no decreased breath sounds. He has no wheezes. He has no rhonchi. He has no rales.  Abdominal: Soft. Normal appearance and bowel sounds are normal. He exhibits no distension. There is no tenderness. There is no rebound and no CVA tenderness.  Musculoskeletal: Normal range of motion. He exhibits no edema or tenderness.  Neurological: He is alert and oriented to person, place, and time. He has normal strength. No cranial nerve deficit or sensory deficit. GCS eye subscore is 4. GCS verbal subscore is 5. GCS motor subscore is 6.  Skin: Skin is warm and dry. No abrasion and no  rash noted.  Psychiatric: He has a normal mood and affect. His speech is normal and behavior is normal.  Nursing note and vitals reviewed.   ED Course  Procedures (including critical care time) DIAGNOSTIC STUDIES: Oxygen Saturation is 100% on room air, normal by my interpretation.    COORDINATION OF CARE: 5:00 PM-Discussed treatment plan which includes UA and lab work with pt at bedside and pt agreed to plan.   Labs Review Labs Reviewed  URINALYSIS, ROUTINE W REFLEX MICROSCOPIC - Abnormal; Notable for the following:    Glucose, UA 500 (*)    Hgb urine dipstick TRACE (*)    Ketones, ur >80 (*)    Protein, ur 30 (*)    All other components within normal limits  CBG MONITORING, ED - Abnormal; Notable for the following:    Glucose-Capillary 473 (*)    All other components within normal limits  URINE MICROSCOPIC-ADD ON  CBC WITH DIFFERENTIAL  BASIC METABOLIC PANEL  BLOOD GAS, VENOUS  ETHANOL  URINE RAPID DRUG SCREEN (HOSP PERFORMED)  LIPASE, BLOOD    Imaging Review No results found.   EKG Interpretation None      MDM   Final diagnoses:  None    I personally performed the services described in this documentation, which was scribed in my presence. The recorded information has been reviewed and is accurate.   6:04 PM Patient given IV fluids here and started on call stabilizer for diabetic ketoacidosis and will be admitted to the hospitalist service  Leota Jacobsen, MD 02/16/14 [phone]

## 2014-02-16 NOTE — ED Notes (Signed)
EMS reports was called out for hyperglycemia.  EMS reports cbg was 412.  EMS administered 1 liter of IVF and was given 4mg  of zofran because pt had vomited multiple times.  CBG decreased to 320 after 1 liter IVF.

## 2014-02-17 LAB — CBC WITH DIFFERENTIAL/PLATELET
Basophils Absolute: 0 10*3/uL (ref 0.0–0.1)
Basophils Relative: 0 % (ref 0–1)
Eosinophils Absolute: 0 10*3/uL (ref 0.0–0.7)
Eosinophils Relative: 0 % (ref 0–5)
HEMATOCRIT: 45.8 % (ref 39.0–52.0)
HEMOGLOBIN: 15.7 g/dL (ref 13.0–17.0)
LYMPHS PCT: 13 % (ref 12–46)
Lymphs Abs: 3.1 10*3/uL (ref 0.7–4.0)
MCH: 31 pg (ref 26.0–34.0)
MCHC: 34.3 g/dL (ref 30.0–36.0)
MCV: 90.3 fL (ref 78.0–100.0)
MONOS PCT: 5 % (ref 3–12)
Monocytes Absolute: 1.1 10*3/uL — ABNORMAL HIGH (ref 0.1–1.0)
NEUTROS ABS: 20 10*3/uL — AB (ref 1.7–7.7)
Neutrophils Relative %: 82 % — ABNORMAL HIGH (ref 43–77)
Platelets: 268 10*3/uL (ref 150–400)
RBC: 5.07 MIL/uL (ref 4.22–5.81)
RDW: 12.1 % (ref 11.5–15.5)
WBC: 24.2 10*3/uL — AB (ref 4.0–10.5)

## 2014-02-17 LAB — GLUCOSE, CAPILLARY
GLUCOSE-CAPILLARY: 149 mg/dL — AB (ref 70–99)
GLUCOSE-CAPILLARY: 118 mg/dL — AB (ref 70–99)
GLUCOSE-CAPILLARY: 131 mg/dL — AB (ref 70–99)
GLUCOSE-CAPILLARY: 136 mg/dL — AB (ref 70–99)
GLUCOSE-CAPILLARY: 176 mg/dL — AB (ref 70–99)
GLUCOSE-CAPILLARY: 133 mg/dL — AB (ref 70–99)
Glucose-Capillary: 105 mg/dL — ABNORMAL HIGH (ref 70–99)
Glucose-Capillary: 120 mg/dL — ABNORMAL HIGH (ref 70–99)
Glucose-Capillary: 126 mg/dL — ABNORMAL HIGH (ref 70–99)
Glucose-Capillary: 135 mg/dL — ABNORMAL HIGH (ref 70–99)
Glucose-Capillary: 146 mg/dL — ABNORMAL HIGH (ref 70–99)
Glucose-Capillary: 146 mg/dL — ABNORMAL HIGH (ref 70–99)
Glucose-Capillary: 151 mg/dL — ABNORMAL HIGH (ref 70–99)
Glucose-Capillary: 155 mg/dL — ABNORMAL HIGH (ref 70–99)
Glucose-Capillary: 182 mg/dL — ABNORMAL HIGH (ref 70–99)
Glucose-Capillary: 193 mg/dL — ABNORMAL HIGH (ref 70–99)
Glucose-Capillary: 224 mg/dL — ABNORMAL HIGH (ref 70–99)
Glucose-Capillary: 238 mg/dL — ABNORMAL HIGH (ref 70–99)
Glucose-Capillary: 249 mg/dL — ABNORMAL HIGH (ref 70–99)
Glucose-Capillary: 253 mg/dL — ABNORMAL HIGH (ref 70–99)
Glucose-Capillary: 269 mg/dL — ABNORMAL HIGH (ref 70–99)
Glucose-Capillary: 274 mg/dL — ABNORMAL HIGH (ref 70–99)
Glucose-Capillary: 284 mg/dL — ABNORMAL HIGH (ref 70–99)
Glucose-Capillary: 97 mg/dL (ref 70–99)

## 2014-02-17 LAB — BASIC METABOLIC PANEL
ANION GAP: 19 — AB (ref 5–15)
ANION GAP: 20 — AB (ref 5–15)
Anion gap: 17 — ABNORMAL HIGH (ref 5–15)
BUN: 19 mg/dL (ref 6–23)
BUN: 19 mg/dL (ref 6–23)
BUN: 19 mg/dL (ref 6–23)
BUN: 19 mg/dL (ref 6–23)
CHLORIDE: 102 meq/L (ref 96–112)
CO2: 12 mEq/L — ABNORMAL LOW (ref 19–32)
CO2: 12 mEq/L — ABNORMAL LOW (ref 19–32)
CO2: 9 mEq/L — CL (ref 19–32)
CO2: 14 mEq/L — ABNORMAL LOW (ref 19–32)
Calcium: 8.4 mg/dL (ref 8.4–10.5)
Calcium: 8.6 mg/dL (ref 8.4–10.5)
Calcium: 8.9 mg/dL (ref 8.4–10.5)
Calcium: 8.8 mg/dL (ref 8.4–10.5)
Chloride: 106 mEq/L (ref 96–112)
Chloride: 105 mEq/L (ref 96–112)
Chloride: 103 mEq/L (ref 96–112)
Creatinine, Ser: 0.82 mg/dL (ref 0.50–1.35)
Creatinine, Ser: 0.74 mg/dL (ref 0.50–1.35)
Creatinine, Ser: 0.69 mg/dL (ref 0.50–1.35)
Creatinine, Ser: 0.8 mg/dL (ref 0.50–1.35)
GFR calc Af Amer: >90 mL/min (ref >90)
GFR calc non Af Amer: >90 mL/min (ref >90)
GFR calc non Af Amer: >90 mL/min (ref >90)
GFR calc non Af Amer: >90 mL/min (ref >90)
Glucose, Bld: 144 mg/dL — ABNORMAL HIGH (ref 70–99)
Glucose, Bld: 140 mg/dL — ABNORMAL HIGH (ref 70–99)
Glucose, Bld: 224 mg/dL — ABNORMAL HIGH (ref 70–99)
Glucose, Bld: 177 mg/dL — ABNORMAL HIGH (ref 70–99)
POTASSIUM: 4.1 meq/L (ref 3.7–5.3)
POTASSIUM: 3.8 meq/L (ref 3.7–5.3)
Potassium: 4.3 mEq/L (ref 3.7–5.3)
Potassium: 3.9 mEq/L (ref 3.7–5.3)
SODIUM: 135 meq/L — AB (ref 137–147)
SODIUM: 134 meq/L — AB (ref 137–147)
Sodium: 137 mEq/L (ref 137–147)
Sodium: 137 mEq/L (ref 137–147)

## 2014-02-17 MED ORDER — ACETAMINOPHEN 325 MG PO TABS
650.0000 mg | ORAL_TABLET | Freq: Four times a day (QID) | ORAL | Status: DC | PRN
  Administered 2014-02-17 – 2014-02-18 (×3): 650 mg via ORAL
  Filled 2014-02-17 (×3): qty 2

## 2014-02-17 MED ORDER — GI COCKTAIL ~~LOC~~
30.0000 mL | Freq: Once | ORAL | Status: AC
  Administered 2014-02-17: 30 mL via ORAL
  Filled 2014-02-17: qty 30

## 2014-02-17 MED ORDER — GI COCKTAIL ~~LOC~~
ORAL | Status: AC
  Filled 2014-02-17: qty 30

## 2014-02-17 NOTE — Progress Notes (Addendum)
Inpatient Diabetes Program Recommendations  AACE/ADA: New Consensus Statement on Inpatient Glycemic Control (2013)  Target Ranges:  Prepandial:   less than 140 mg/dL      Peak postprandial:   less than 180 mg/dL (1-2 hours)      Critically ill patients:  140 - 180 mg/dL  Results for Pedro [name], Pedro [name] (MRN [id number]) as of 02/17/2014 07:47  Ref. Range 02/17/2014 02:11 02/17/2014 03:06 02/17/2014 04:25 02/17/2014 05:22 02/17/2014 06:05 02/17/2014 06:48  Glucose-Capillary Latest Range: 70-99 mg/dL 126 (H) 118 (H) 155 (H) 151 (H) 135 (H) 131 (H)    Results for Pedro [name], Pedro [name] (MRN [id number]) as of 02/17/2014 07:47  Ref. Range 10/16/2012 05:31  Hgb A1c MFr Bld Latest Range: <5.7 % 11.3 (H)    Diabetes history: DM1 Outpatient Diabetes medications: Lantus 48 units daily, Novolog 5-10 units TID with meals Current orders for Inpatient glycemic control: Novolin R insulin drip per DKA protocol  Inpatient Diabetes Program Recommendations HgbA1C: Please add an A1C on to blood in lab to evaluate glycemic control over the past 2-3 months.  Note: Patient was admitted with DKA with initial glucose of 473 mg/dl. Labs at 4:49 am today resulted with CO2 12 and AG 20. CBGs have been in target range for the past 8 hours but BMET indicates acidosis has not been resolved. Will continue on IV insulin per DKA protocol until transition criteria is met. Will continue to follow.   02/17/14_0 :25-Spoke with patient about diabetes and home regimen for diabetes control. Patient lying in bed with his eyes closed. Asked if he could wake up and talk with me regarding diabetes and home regimen and patient kept eyes closed and stated "So talk."  Patient kept eyes closed during entire conversation and only answered questions asked and did not provide any other information throughout the visit. Patient reports that he is followed by his PCP for diabetes management and currently he takes Lantus 48 units daily and Novolog  correction TID with meals (based on glucose) as an outpatient for diabetes control. Inquired about knowledge about A1C and patient reports that he knows what an A1C is but does not recall his previous A1C. Patient reports that he checks his glucose 3 times per day and "does what he can to keep his diabetes controlled". Patient reports that he has a glucometer, testing supplies, and insulin at home. Noted patient was hospitalized in August, October, and now with DKA. Inquired about frequent DKA admissions and patient reports that he ran out of insulin with one, forgot to take his insulin with one, and he states that he took his insulin this time but had nausea and vomiting. Stressed importance of glycemic control to prevent complications from uncontrolled diabetes. Encouraged patient to check his glucose 3-4 times per day, take insulin as prescribed, follow a carb modified diet, and to follow up with his PCP to improve glycemic control. Patient states that he is not followed by an endocrinologist. Informed patient about local endocrinologist and encouraged him to discuss being referred to Dr. Dorris Fetch with his PCP so he can get help with improving diabetes control.  Patient verbalized understanding of information discussed and he states that he has no further questions at this time related to diabetes.   Thanks, Barnie Alderman, RN, MSN, CCRN, CDE Diabetes Coordinator Inpatient Diabetes Program [phone] (Team Pager) [phone] ([facility] office) [phone] [facility] office)

## 2014-02-17 NOTE — Progress Notes (Signed)
[facility] PROGRESS NOTE  TIMOUTHY GILARDI NGE:[id number] DOB: Mar 04, 1996 DOA: 02/16/2014 PCP: Chevis Pretty, FNP  Assessment/Plan: 1. Diabetic ketoacidosis- patient started on DKA protocol, AG is still 17. Continue with IV insulin. Follow BMP q 4 hrs. 2. Gastroenteritis- stable. 3. DVT prophylaxis- Lovenox.  Code Status: Full code Family Communication: *No family at bedside Disposition Plan: *Home when stable   Consultants:  *None  Procedures:  None  Antibiotics:  None  HPI/Subjective: 18 y.o. male with type 1 diabetes and neuropathy who presents emergency department with onset of vomiting stomach contents since this morning with accompanied chills and subjective fever. The patient has not tolerated any oral food or liquid. No pale eating or provoking factors other than eating. His glucometer read that his blood sugars were high and patient came to the hospital where he was found to be in DKA. He reports no sick contacts or eating abnormal foods yesterday. His had very little to eat today. Last time he took his insulin was last night. Reports that his blood sugars were in the low 100s.  Objective: Filed Vitals:   02/17/14 1204  BP:   Pulse:   Temp: 98.2 F (36.8 C)  Resp:     Intake/Output Summary (Last 24 hours) at 02/17/14 1354 Last data filed at 02/17/14 0957  Gross per 24 hour  Intake 939.62 ml  Output   2200 ml  Net -1260.38 ml   Filed Weights   02/16/14 1638 02/17/14 0500  Weight: 65.318 kg (144 lb) 61.2 kg (134 lb 14.7 oz)    Exam:  Physical Exam: Eyes: No icterus, extraocular muscles intact  Lungs: Normal respiratory effort, bilateral clear to auscultation, no crackles or wheezes.  Heart: Regular rate and rhythm, S1 and S2 normal, no murmurs, rubs auscultated Abdomen: BS normoactive,soft,nondistended,non-tender to palpation,no organomegaly Extremities: No pretibial edema, no erythema, no cyanosis, no clubbing Neuro : Alert and  oriented to time, place and person, No focal deficits Skin: No rashes seen on exam  Data Reviewed: Basic Metabolic Panel:  Recent Labs Lab 02/16/14 2004 02/17/14 0101 02/17/14 0449 02/17/14 0842 02/17/14 1244  NA 135* 137 137 135* 134*  K 5.4* 4.3 4.1 3.9 3.8  CL 102 106 105 102 103  CO2 7* 12* 12* 9* 14*  GLUCOSE 281* 144* 140* 224* 177*  BUN 21 19 19 19 19   CREATININE 0.81 0.82 0.74 0.69 0.80  CALCIUM 8.4 8.4 8.6 8.9 8.8   Liver Function Tests:  Recent Labs Lab 02/14/14 2245  AST 8  ALT 9  ALKPHOS 88  BILITOT 0.4  PROT 6.8  ALBUMIN 3.8    Recent Labs Lab 02/16/14 1703  LIPASE 10*   No results for input(s): AMMONIA in the last 168 hours. CBC:  Recent Labs Lab 02/14/14 2245 02/16/14 1703 02/17/14 0449  WBC 9.4 25.8* 24.2*  NEUTROABS  --  22.2* 20.0*  HGB 16.3 16.0 15.7  HCT 47.2 47.6 45.8  MCV 91.7 92.8 90.3  PLT 228 245 268   Cardiac Enzymes: No results for input(s): CKTOTAL, CKMB, CKMBINDEX, TROPONINI in the last 168 hours. BNP (last 3 results) No results for input(s): PROBNP in the last 8760 hours. CBG:  Recent Labs Lab 02/17/14 0852 02/17/14 0955 02/17/14 1054 02/17/14 1151 02/17/14 1254  GLUCAP 238* 284* 253* 249* 176*    Recent Results (from the past 240 hour(s))  MRSA PCR Screening     Status: None   Collection Time: 02/16/14  6:57 PM  Result Value Ref Range Status  MRSA by PCR NEGATIVE NEGATIVE Final    Comment:        The GeneXpert MRSA Assay (FDA approved for NASAL specimens only), is one component of a comprehensive MRSA colonization surveillance program. It is not intended to diagnose MRSA infection nor to guide or monitor treatment for MRSA infections.      Studies: Dg Chest 2 View  02/16/2014   CLINICAL DATA:  Increased weakness  EXAM: CHEST  2 VIEW  COMPARISON:  02/01/2014  FINDINGS: The heart size and mediastinal contours are within normal limits. Both lungs are clear. The visualized skeletal structures are  unremarkable.  IMPRESSION: No active cardiopulmonary disease.   Electronically Signed   By: Inez Catalina M.D.   On: 02/16/2014 18:53    Scheduled Meds: . enoxaparin (LOVENOX) injection  40 mg Subcutaneous Q24H  . gabapentin  300 mg Oral QHS  . nicotine  14 mg Transdermal Daily   Continuous Infusions: . sodium chloride 125 mL/hr (02/16/14 2100)  . dextrose 5 % and 0.45% NaCl 1,000 mL (02/17/14 1205)  . insulin (NOVOLIN-R) infusion 4.6 mL/hr at 02/17/14 1255    Active Problems:   DKA, type 1   Gastroenteritis    Time spent: 25 min    [facility] Pager [phone]. If 7PM-7AM, please contact night-coverage at [email or web address], password [email or web address] 02/17/2014, 1:54 PM  LOS: 1 day

## 2014-02-18 LAB — GLUCOSE, CAPILLARY
GLUCOSE-CAPILLARY: 116 mg/dL — AB (ref 70–99)
GLUCOSE-CAPILLARY: 203 mg/dL — AB (ref 70–99)
Glucose-Capillary: 128 mg/dL — ABNORMAL HIGH (ref 70–99)
Glucose-Capillary: 129 mg/dL — ABNORMAL HIGH (ref 70–99)
Glucose-Capillary: 133 mg/dL — ABNORMAL HIGH (ref 70–99)
Glucose-Capillary: 135 mg/dL — ABNORMAL HIGH (ref 70–99)
Glucose-Capillary: 147 mg/dL — ABNORMAL HIGH (ref 70–99)
Glucose-Capillary: 156 mg/dL — ABNORMAL HIGH (ref 70–99)
Glucose-Capillary: 162 mg/dL — ABNORMAL HIGH (ref 70–99)
Glucose-Capillary: 164 mg/dL — ABNORMAL HIGH (ref 70–99)
Glucose-Capillary: 174 mg/dL — ABNORMAL HIGH (ref 70–99)
Glucose-Capillary: 191 mg/dL — ABNORMAL HIGH (ref 70–99)
Glucose-Capillary: 206 mg/dL — ABNORMAL HIGH (ref 70–99)
Glucose-Capillary: 92 mg/dL (ref 70–99)
Glucose-Capillary: 97 mg/dL (ref 70–99)

## 2014-02-18 LAB — BASIC METABOLIC PANEL WITH GFR
Anion gap: 17 — ABNORMAL HIGH (ref 5–15)
BUN: 16 mg/dL (ref 6–23)
CO2: 19 meq/L (ref 19–32)
Calcium: 8.7 mg/dL (ref 8.4–10.5)
Chloride: 101 meq/L (ref 96–112)
Creatinine, Ser: 0.66 mg/dL (ref 0.50–1.35)
GFR calc Af Amer: >90 mL/min (ref >90)
GFR calc non Af Amer: >90 mL/min (ref >90)
Glucose, Bld: 152 mg/dL — ABNORMAL HIGH (ref 70–99)
Potassium: 3.6 meq/L — ABNORMAL LOW (ref 3.7–5.3)
Sodium: 137 meq/L (ref 137–147)

## 2014-02-18 LAB — BASIC METABOLIC PANEL
Anion gap: 16 — ABNORMAL HIGH (ref 5–15)
Anion gap: 19 — ABNORMAL HIGH (ref 5–15)
Anion gap: 16 — ABNORMAL HIGH (ref 5–15)
BUN: 18 mg/dL (ref 6–23)
BUN: 18 mg/dL (ref 6–23)
BUN: 18 mg/dL (ref 6–23)
CO2: 17 mEq/L — ABNORMAL LOW (ref 19–32)
CO2: 15 mEq/L — ABNORMAL LOW (ref 19–32)
CO2: 18 mEq/L — ABNORMAL LOW (ref 19–32)
Calcium: 8.9 mg/dL (ref 8.4–10.5)
Calcium: 8.6 mg/dL (ref 8.4–10.5)
Calcium: 8.9 mg/dL (ref 8.4–10.5)
Chloride: 103 mEq/L (ref 96–112)
Chloride: 100 mEq/L (ref 96–112)
Chloride: 101 mEq/L (ref 96–112)
Creatinine, Ser: 0.71 mg/dL (ref 0.50–1.35)
Creatinine, Ser: 0.74 mg/dL (ref 0.50–1.35)
Creatinine, Ser: 0.62 mg/dL (ref 0.50–1.35)
GFR calc Af Amer: >90 mL/min (ref >90)
GFR calc Af Amer: >90 mL/min (ref >90)
GFR calc Af Amer: >90 mL/min (ref >90)
GFR calc non Af Amer: >90 mL/min (ref >90)
GFR calc non Af Amer: >90 mL/min (ref >90)
GFR calc non Af Amer: >90 mL/min (ref >90)
Glucose, Bld: 133 mg/dL — ABNORMAL HIGH (ref 70–99)
Glucose, Bld: 208 mg/dL — ABNORMAL HIGH (ref 70–99)
Glucose, Bld: 187 mg/dL — ABNORMAL HIGH (ref 70–99)
Potassium: 3.3 mEq/L — ABNORMAL LOW (ref 3.7–5.3)
Potassium: 4 mEq/L (ref 3.7–5.3)
Potassium: 3.6 mEq/L — ABNORMAL LOW (ref 3.7–5.3)
Sodium: 136 mEq/L — ABNORMAL LOW (ref 137–147)
Sodium: 134 mEq/L — ABNORMAL LOW (ref 137–147)
Sodium: 135 mEq/L — ABNORMAL LOW (ref 137–147)

## 2014-02-18 MED ORDER — POTASSIUM CHLORIDE 10 MEQ/100ML IV SOLN
10.0000 meq | INTRAVENOUS | Status: AC
  Administered 2014-02-18 (×3): 10 meq via INTRAVENOUS
  Filled 2014-02-18: qty 100

## 2014-02-18 NOTE — Progress Notes (Signed)
Called to room by patient stating he wanted his dc papers.  Patient stated he wanted to leave and would not state why.  MD notified and stated he was not medically ready for dc.  Patient made aware and stated he had to leave anyways.  Made patient aware of risks of leaving AMA.  Patient left with girlfriend.  Schonewitz, Eulis Canner 02/18/2014

## 2014-02-18 NOTE — Progress Notes (Signed)
[facility] PROGRESS NOTE  Pedro [name] HYQ:[id number] DOB: Jul 29, 1995 DOA: 02/16/2014 PCP: Chevis Pretty, FNP  Assessment/Plan: 1. Diabetic ketoacidosis- patient started on DKA protocol, AG is still 16. Continue with IV insulin. Follow BMP q 4 hrs. 2. Gastroenteritis- stable. 3. DVT prophylaxis- Lovenox.  Code Status: Full code Family Communication: *No family at bedside Disposition Plan: *Home when stable   Consultants:  *None  Procedures:  None  Antibiotics:  None  HPI/Subjective: 18 y.o. male with type 1 diabetes and neuropathy who presents emergency department with onset of vomiting stomach contents since this morning with accompanied chills and subjective fever. The patient has not tolerated any oral food or liquid. No pale eating or provoking factors other than eating. His glucometer read that his blood sugars were high and patient came to the hospital where he was found to be in DKA. He reports no sick contacts or eating abnormal foods yesterday. His had very little to eat today. Last time he took his insulin was last night. Reports that his blood sugars were in the low 100s.  He denies any complaints this morning, started on the clear liquid diet.  Objective: Filed Vitals:   02/18/14 1300  BP: 127/81  Pulse: 87  Temp:   Resp: 15    Intake/Output Summary (Last 24 hours) at 02/18/14 1417 Last data filed at 02/18/14 1300  Gross per 24 hour  Intake 2606.9 ml  Output   1275 ml  Net 1331.9 ml   Filed Weights   02/16/14 1638 02/17/14 0500 02/18/14 0500  Weight: 65.318 kg (144 lb) 61.2 kg (134 lb 14.7 oz) 61.7 kg (136 lb 0.4 oz)    Exam:  Physical Exam: Eyes: No icterus, extraocular muscles intact  Lungs: Normal respiratory effort, bilateral clear to auscultation, no crackles or wheezes.  Heart: Regular rate and rhythm, S1 and S2 normal, no murmurs, rubs auscultated Abdomen: BS normoactive,soft,nondistended,non-tender to palpation,no  organomegaly Extremities: No pretibial edema, no erythema, no cyanosis, no clubbing Neuro : Alert and oriented to time, place and person, No focal deficits Skin: No rashes seen on exam  Data Reviewed: Basic Metabolic Panel:  Recent Labs Lab 02/17/14 0842 02/17/14 1244 02/18/14 0050 02/18/14 0703 02/18/14 1043  NA 135* 134* 136* 134* 135*  K 3.9 3.8 3.3* 4.0 3.6*  CL 102 103 103 100 101  CO2 9* 14* 17* 15* 18*  GLUCOSE 224* 177* 133* 208* 187*  BUN 19 19 18 18 18   CREATININE 0.69 0.80 0.71 0.74 0.62  CALCIUM 8.9 8.8 8.9 8.6 8.9   Liver Function Tests:  Recent Labs Lab 02/14/14 2245  AST 8  ALT 9  ALKPHOS 88  BILITOT 0.4  PROT 6.8  ALBUMIN 3.8    Recent Labs Lab 02/16/14 1703  LIPASE 10*   No results for input(s): AMMONIA in the last 168 hours. CBC:  Recent Labs Lab 02/14/14 2245 02/16/14 1703 02/17/14 0449  WBC 9.4 25.8* 24.2*  NEUTROABS  --  22.2* 20.0*  HGB 16.3 16.0 15.7  HCT 47.2 47.6 45.8  MCV 91.7 92.8 90.3  PLT 228 245 268   Cardiac Enzymes: No results for input(s): CKTOTAL, CKMB, CKMBINDEX, TROPONINI in the last 168 hours. BNP (last 3 results) No results for input(s): PROBNP in the last 8760 hours. CBG:  Recent Labs Lab 02/18/14 0937 02/18/14 1043 02/18/14 1133 02/18/14 1240 02/18/14 1339  GLUCAP 203* 162* 174* 147* 128*    Recent Results (from the past 240 hour(s))  MRSA PCR Screening  Status: None   Collection Time: 02/16/14  6:57 PM  Result Value Ref Range Status   MRSA by PCR NEGATIVE NEGATIVE Final    Comment:        The GeneXpert MRSA Assay (FDA approved for NASAL specimens only), is one component of a comprehensive MRSA colonization surveillance program. It is not intended to diagnose MRSA infection nor to guide or monitor treatment for MRSA infections.      Studies: Dg Chest 2 View  02/16/2014   CLINICAL DATA:  Increased weakness  EXAM: CHEST  2 VIEW  COMPARISON:  02/01/2014  FINDINGS: The heart size and  mediastinal contours are within normal limits. Both lungs are clear. The visualized skeletal structures are unremarkable.  IMPRESSION: No active cardiopulmonary disease.   Electronically Signed   By: Inez Catalina M.D.   On: 02/16/2014 18:53    Scheduled Meds: . enoxaparin (LOVENOX) injection  40 mg Subcutaneous Q24H  . gabapentin  300 mg Oral QHS  . nicotine  14 mg Transdermal Daily   Continuous Infusions: . sodium chloride 125 mL/hr (02/16/14 2100)  . dextrose 5 % and 0.45% NaCl 75 mL/hr at 02/18/14 1300  . insulin (NOVOLIN-R) infusion 1.4 mL/hr at 02/18/14 1340    Active Problems:   DKA, type 1   Gastroenteritis    Time spent: 25 min    [facility] Pager [phone]. If 7PM-7AM, please contact night-coverage at [email or web address], password [facility] 02/18/2014, 2:17 PM  LOS: 2 days

## 2014-02-18 NOTE — Care Management Utilization Note (Signed)
UR complete 

## 2014-02-18 NOTE — Care Management Note (Signed)
    Page 1 of 1   02/18/2014     2:22:22 PM CARE MANAGEMENT NOTE 02/18/2014  Patient:  Pedro [name], Pedro [name]   Account Number:  [id number]  Date Initiated:  02/18/2014  Documentation initiated by:  CHILDRESS,JESSICA  Subjective/Objective Assessment:   Pt from home with self care.     Action/Plan:   Pt plans to discharge home with self care. No CM needs identified.   Anticipated DC Date:  02/20/2014   Anticipated DC Plan:  [place]  CM consult      Choice offered to / List presented to:             Status of service:  In process, will continue to follow Medicare Important Message given?   (If response is "NO", the following Medicare IM given date fields will be blank) Date Medicare IM given:   Medicare IM given by:   Date Additional Medicare IM given:   Additional Medicare IM given by:    Discharge Disposition:  HOME/SELF CARE  Per UR Regulation:  Reviewed for med. necessity/level of care/duration of stay  If discussed at [place] of Stay Meetings, dates discussed:    Comments:  02/18/2014 [place], RN, MSN, [facility]

## 2014-03-04 NOTE — Discharge Summary (Signed)
Physician Discharge Summary  Pedro [name] GQQ:[id number] DOB: 1995/07/20 DOA: 02/16/2014  PCP: Chevis Pretty, FNP  Admit date: 02/16/2014 Discharge date: 03/04/2014  Time spent: 25  minutes   Discharge Diagnoses:  Active Problems:   DKA, type 1   Gastroenteritis    Patient left AMA  Filed Weights   02/16/14 1638 02/17/14 0500 02/18/14 0500  Weight: 65.318 kg (144 lb) 61.2 kg (134 lb 14.7 oz) 61.7 kg (136 lb 0.4 oz)    History of present illness:  18 y.o. male  With type 1 diabetes and neuropathy who presents emergency department with onset of vomiting stomach contents since this morning with accompanied chills and subjective fever. The patient has not tolerated any oral food or liquid. No pale eating or provoking factors other than eating. His glucometer read that his blood sugars were high and patient came to the hospital where he was found to be in DKA. He reports no sick contacts or eating abnormal foods yesterday. His had very little to eat today. Last time he took his insulin was last night. Reports that his blood sugars were in the low 100s  Hospital Course:  Patient was admitted with Diabetic ketoacidosis. Started on DKA protocol, on IV insulin infusion. Anion gap on 12/15 /15 was still 16. Patient decided to leave AMA. Educated by the nurse the risks of unresolved DKA. But patient chose to leave against medical advice.  No discharge instructions given.  Procedures: None  Consultations:  None  Discharge Exam: Filed Vitals:   02/18/14 1500  BP: 125/85  Pulse: 83  Temp:   Resp: 17      Discharge Instructions    Discharge Medication List as of 02/18/2014  4:30 PM    CONTINUE these medications which have NOT CHANGED   Details  doxycycline (VIBRA-TABS) 100 MG tablet Take 100 mg by mouth 2 (two) times daily. Started on 02/13/14 for 10 days, Until Discontinued, Historical Med    gabapentin (NEURONTIN) 300 MG capsule Take 1 capsule (300 mg  total) by mouth at bedtime., Starting 11/01/2013, Until Discontinued, Normal    insulin aspart (NOVOLOG FLEXPEN) 100 UNIT/ML FlexPen Inject 5-10 Units into the skin 3 (three) times daily with meals., Starting 10/05/2013, Until Discontinued, Print    Insulin Glargine (LANTUS) 100 UNIT/ML Solostar Pen Inject 48 units + 1 units as needed to prime pen (total of 49 units) once daily., Normal    valACYclovir (VALTREX) 1000 MG tablet Take 1 tablet (1,000 mg total) by mouth 2 (two) times daily., Starting 11/01/2013, Until Discontinued, Normal       Allergies  Allergen Reactions  . Sulfa Antibiotics Hives    fever      The results of significant diagnostics from this hospitalization (including imaging, microbiology, ancillary and laboratory) are listed below for reference.    Significant Diagnostic Studies: Dg Chest 2 View  02/16/2014   CLINICAL DATA:  Increased weakness  EXAM: CHEST  2 VIEW  COMPARISON:  02/01/2014  FINDINGS: The heart size and mediastinal contours are within normal limits. Both lungs are clear. The visualized skeletal structures are unremarkable.  IMPRESSION: No active cardiopulmonary disease.   Electronically Signed   By: Inez Catalina M.D.   On: 02/16/2014 18:53    Microbiology: No results found for this or any previous visit (from the past 240 hour(s)).   Labs: Basic Metabolic Panel: No results for input(s): NA, K, CL, CO2, GLUCOSE, BUN, CREATININE, CALCIUM, MG, PHOS in the last 168 hours. Liver Function Tests: No  results for input(s): AST, ALT, ALKPHOS, BILITOT, PROT, ALBUMIN in the last 168 hours. No results for input(s): LIPASE, AMYLASE in the last 168 hours. No results for input(s): AMMONIA in the last 168 hours. CBC: No results for input(s): WBC, NEUTROABS, HGB, HCT, MCV, PLT in the last 168 hours. Cardiac Enzymes: No results for input(s): CKTOTAL, CKMB, CKMBINDEX, TROPONINI in the last 168 hours. BNP: BNP (last 3 results) No results for input(s): PROBNP in the  last 8760 hours. CBG: No results for input(s): GLUCAP in the last 168 hours.     SignedEleonore Chiquito S  [facility] 03/04/2014, 7:44 PM

## 2014-03-18 ENCOUNTER — Other Ambulatory Visit: Payer: Self-pay | Admitting: *Deleted

## 2014-03-18 MED ORDER — INSULIN PEN NEEDLE 31G X 6 MM MISC: Status: DC

## 2014-03-19 ENCOUNTER — Inpatient Hospital Stay (HOSPITAL_COMMUNITY)
Admission: EM | Admit: 2014-03-19 | Discharge: 2014-03-21 | DRG: 639 | Disposition: A | Discharge status: Home / Self Care | Payer: Medicaid Other | Attending: Internal Medicine | Admitting: Internal Medicine

## 2014-03-19 ENCOUNTER — Encounter (HOSPITAL_COMMUNITY): Payer: Self-pay | Admitting: *Deleted

## 2014-03-19 ENCOUNTER — Emergency Department (HOSPITAL_COMMUNITY): Payer: Medicaid Other

## 2014-03-19 DIAGNOSIS — E1140 Type 2 diabetes mellitus with diabetic neuropathy, unspecified: Secondary | ICD-10-CM

## 2014-03-19 DIAGNOSIS — F1290 Cannabis use, unspecified, uncomplicated: Secondary | ICD-10-CM | POA: Diagnosis present

## 2014-03-19 DIAGNOSIS — Z91199 Patient's noncompliance with other medical treatment and regimen due to unspecified reason: Secondary | ICD-10-CM

## 2014-03-19 DIAGNOSIS — E875 Hyperkalemia: Secondary | ICD-10-CM | POA: Diagnosis present

## 2014-03-19 DIAGNOSIS — R05 Cough: Secondary | ICD-10-CM

## 2014-03-19 DIAGNOSIS — Z9114 Patient's other noncompliance with medication regimen: Secondary | ICD-10-CM | POA: Diagnosis present

## 2014-03-19 DIAGNOSIS — A6000 Herpesviral infection of urogenital system, unspecified: Secondary | ICD-10-CM

## 2014-03-19 DIAGNOSIS — D72829 Elevated white blood cell count, unspecified: Secondary | ICD-10-CM | POA: Diagnosis present

## 2014-03-19 DIAGNOSIS — Z882 Allergy status to sulfonamides status: Secondary | ICD-10-CM

## 2014-03-19 DIAGNOSIS — F909 Attention-deficit hyperactivity disorder, unspecified type: Secondary | ICD-10-CM | POA: Diagnosis present

## 2014-03-19 DIAGNOSIS — E785 Hyperlipidemia, unspecified: Secondary | ICD-10-CM | POA: Diagnosis present

## 2014-03-19 DIAGNOSIS — Z833 Family history of diabetes mellitus: Secondary | ICD-10-CM

## 2014-03-19 DIAGNOSIS — F17210 Nicotine dependence, cigarettes, uncomplicated: Secondary | ICD-10-CM | POA: Diagnosis present

## 2014-03-19 DIAGNOSIS — Z9119 Patient's noncompliance with other medical treatment and regimen: Secondary | ICD-10-CM

## 2014-03-19 DIAGNOSIS — E860 Dehydration: Secondary | ICD-10-CM | POA: Diagnosis present

## 2014-03-19 DIAGNOSIS — Z720 Tobacco use: Secondary | ICD-10-CM

## 2014-03-19 DIAGNOSIS — E1040 Type 1 diabetes mellitus with diabetic neuropathy, unspecified: Secondary | ICD-10-CM | POA: Diagnosis present

## 2014-03-19 DIAGNOSIS — F1994 Other psychoactive substance use, unspecified with psychoactive substance-induced mood disorder: Secondary | ICD-10-CM

## 2014-03-19 DIAGNOSIS — F1920 Other psychoactive substance dependence, uncomplicated: Secondary | ICD-10-CM

## 2014-03-19 DIAGNOSIS — K529 Noninfective gastroenteritis and colitis, unspecified: Secondary | ICD-10-CM

## 2014-03-19 DIAGNOSIS — R059 Cough, unspecified: Secondary | ICD-10-CM

## 2014-03-19 DIAGNOSIS — E1010 Type 1 diabetes mellitus with ketoacidosis without coma: Principal | ICD-10-CM

## 2014-03-19 DIAGNOSIS — E1110 Type 2 diabetes mellitus with ketoacidosis without coma: Secondary | ICD-10-CM | POA: Diagnosis present

## 2014-03-19 LAB — CBC WITH DIFFERENTIAL/PLATELET
BASOS ABS: 0.2 10*3/uL — AB (ref 0.0–0.1)
BASOS PCT: 0 % (ref 0–1)
EOS PCT: 0 % (ref 0–5)
Eosinophils Absolute: 0.1 10*3/uL (ref 0.0–0.7)
HCT: 57 % — ABNORMAL HIGH (ref 39.0–52.0)
Hemoglobin: 19.3 g/dL — ABNORMAL HIGH (ref 13.0–17.0)
Lymphocytes Relative: 14 % (ref 12–46)
Lymphs Abs: 5.1 10*3/uL — ABNORMAL HIGH (ref 0.7–4.0)
MCH: 31.5 pg (ref 26.0–34.0)
MCHC: 33.9 g/dL (ref 30.0–36.0)
MCV: 93 fL (ref 78.0–100.0)
Monocytes Absolute: 2.4 10*3/uL — ABNORMAL HIGH (ref 0.1–1.0)
Monocytes Relative: 6 % (ref 3–12)
NEUTROS ABS: 29.3 10*3/uL — AB (ref 1.7–7.7)
Neutrophils Relative %: 79 % — ABNORMAL HIGH (ref 43–77)
Platelets: 355 10*3/uL (ref 150–400)
RBC: 6.13 MIL/uL — AB (ref 4.22–5.81)
RDW: 12.4 % (ref 11.5–15.5)
WBC: 37 10*3/uL — AB (ref 4.0–10.5)

## 2014-03-19 LAB — URINALYSIS, ROUTINE W REFLEX MICROSCOPIC
Bilirubin Urine: NEGATIVE
GLUCOSE, UA: 500 mg/dL — AB
Ketones, ur: >80 mg/dL — AB
LEUKOCYTES UA: NEGATIVE
NITRITE: NEGATIVE
PH: 5.5 (ref 5.0–8.0)
Protein, ur: 100 mg/dL — AB
Urobilinogen, UA: 0.2 mg/dL (ref 0.0–1.0)

## 2014-03-19 LAB — URINE MICROSCOPIC-ADD ON

## 2014-03-19 LAB — GLUCOSE, CAPILLARY
GLUCOSE-CAPILLARY: 297 mg/dL — AB (ref 70–99)
Glucose-Capillary: 291 mg/dL — ABNORMAL HIGH (ref 70–99)
Glucose-Capillary: 263 mg/dL — ABNORMAL HIGH (ref 70–99)
Glucose-Capillary: 213 mg/dL — ABNORMAL HIGH (ref 70–99)

## 2014-03-19 LAB — HEMOGLOBIN A1C
Hgb A1c MFr Bld: 11.5 % — ABNORMAL HIGH (ref <5.7)
Mean Plasma Glucose: 283 mg/dL — ABNORMAL HIGH (ref <117)

## 2014-03-19 LAB — BASIC METABOLIC PANEL
ANION GAP: 26 — AB (ref 5–15)
Anion gap: 14 (ref 5–15)
BUN: 25 mg/dL — AB (ref 6–23)
BUN: 21 mg/dL (ref 6–23)
CALCIUM: 9.9 mg/dL (ref 8.4–10.5)
CALCIUM: 8.4 mg/dL (ref 8.4–10.5)
CHLORIDE: 98 meq/L (ref 96–112)
CHLORIDE: 113 meq/L — AB (ref 96–112)
CO2: 9 mmol/L — AB (ref 19–32)
CO2: 7 mmol/L — AB (ref 19–32)
CREATININE: 1.3 mg/dL (ref 0.50–1.35)
Creatinine, Ser: 1 mg/dL (ref 0.50–1.35)
GFR calc Af Amer: >90 mL/min (ref >90)
GFR calc Af Amer: >90 mL/min (ref >90)
GFR calc non Af Amer: 78 mL/min — ABNORMAL LOW (ref >90)
GFR calc non Af Amer: >90 mL/min (ref >90)
GLUCOSE: 509 mg/dL — AB (ref 70–99)
GLUCOSE: 244 mg/dL — AB (ref 70–99)
POTASSIUM: 4.7 mmol/L (ref 3.5–5.1)
Potassium: 5.2 mmol/L — ABNORMAL HIGH (ref 3.5–5.1)
SODIUM: 134 mmol/L — AB (ref 135–145)
Sodium: 133 mmol/L — ABNORMAL LOW (ref 135–145)

## 2014-03-19 LAB — RAPID URINE DRUG SCREEN, HOSP PERFORMED
Amphetamines: NOT DETECTED
BARBITURATES: NOT DETECTED
Benzodiazepines: NOT DETECTED
Cocaine: NOT DETECTED
OPIATES: NOT DETECTED
Tetrahydrocannabinol: POSITIVE — AB

## 2014-03-19 LAB — BLOOD GAS, ARTERIAL
Acid-base deficit: 25.6 mmol/L — ABNORMAL HIGH (ref 0.0–2.0)
BICARBONATE: 3.3 meq/L — AB (ref 20.0–24.0)
DRAWN BY: 234301
FIO2: 21 %
O2 SAT: 96.8 %
PATIENT TEMPERATURE: 37
PO2 ART: 131 mmHg — AB (ref 80.0–100.0)
TCO2: 3.1 mmol/L (ref 0–100)
pCO2 arterial: 13.2 mmHg — CL (ref 35.0–45.0)
pH, Arterial: 7.034 — CL (ref 7.350–7.450)

## 2014-03-19 LAB — ETHANOL: Alcohol, Ethyl (B): <5 mg/dL (ref 0–9)

## 2014-03-19 LAB — MRSA PCR SCREENING: MRSA by PCR: NEGATIVE

## 2014-03-19 LAB — CBG MONITORING, ED
Glucose-Capillary: 532 mg/dL — ABNORMAL HIGH (ref 70–99)
Glucose-Capillary: 469 mg/dL — ABNORMAL HIGH (ref 70–99)
Glucose-Capillary: 397 mg/dL — ABNORMAL HIGH (ref 70–99)

## 2014-03-19 LAB — MAGNESIUM: Magnesium: 2.2 mg/dL (ref 1.5–2.5)

## 2014-03-19 LAB — LACTIC ACID, PLASMA: Lactic Acid, Venous: 3.9 mmol/L — ABNORMAL HIGH (ref 0.5–2.2)

## 2014-03-19 MED ORDER — ONDANSETRON HCL 4 MG/2ML IJ SOLN
4.0000 mg | Freq: Once | INTRAMUSCULAR | Status: AC
  Administered 2014-03-19: 4 mg via INTRAVENOUS
  Filled 2014-03-19: qty 2

## 2014-03-19 MED ORDER — SODIUM CHLORIDE 0.9 % IV BOLUS (SEPSIS)
1000.0000 mL | Freq: Once | INTRAVENOUS | Status: AC
  Administered 2014-03-19: 1000 mL via INTRAVENOUS

## 2014-03-19 MED ORDER — SODIUM CHLORIDE 0.9 % IV SOLN
INTRAVENOUS | Status: DC
  Administered 2014-03-19: 16:00:00 via INTRAVENOUS
  Filled 2014-03-19: qty 2.5

## 2014-03-19 MED ORDER — NICOTINE 7 MG/24HR TD PT24
7.0000 mg | MEDICATED_PATCH | Freq: Every day | TRANSDERMAL | Status: DC
  Administered 2014-03-20 – 2014-03-21 (×2): 7 mg via TRANSDERMAL
  Filled 2014-03-19 (×8): qty 1

## 2014-03-19 MED ORDER — SODIUM CHLORIDE 0.9 % IV SOLN
INTRAVENOUS | Status: AC
  Administered 2014-03-19: 18:00:00 via INTRAVENOUS

## 2014-03-19 MED ORDER — SODIUM CHLORIDE 0.9 % IV SOLN: INTRAVENOUS | Status: AC

## 2014-03-19 MED ORDER — GABAPENTIN 300 MG PO CAPS
300.0000 mg | ORAL_CAPSULE | Freq: Every day | ORAL | Status: DC
  Administered 2014-03-20: 300 mg via ORAL
  Filled 2014-03-19: qty 1

## 2014-03-19 MED ORDER — SENNA 8.6 MG PO TABS
1.0000 | ORAL_TABLET | Freq: Two times a day (BID) | ORAL | Status: DC | PRN
  Filled 2014-03-19: qty 1

## 2014-03-19 MED ORDER — FENTANYL CITRATE 0.05 MG/ML IJ SOLN
100.0000 ug | INTRAMUSCULAR | Status: DC | PRN
Start: 2014-03-19 — End: 2014-03-21
  Administered 2014-03-19 (×2): 100 ug via INTRAVENOUS
  Filled 2014-03-19 (×2): qty 2

## 2014-03-19 MED ORDER — DEXTROSE-NACL 5-0.45 % IV SOLN
INTRAVENOUS | Status: AC
  Administered 2014-03-20 (×2): via INTRAVENOUS

## 2014-03-19 MED ORDER — SODIUM CHLORIDE 0.9 % IV SOLN
INTRAVENOUS | Status: DC
  Administered 2014-03-19: 19:00:00 via INTRAVENOUS

## 2014-03-19 MED ORDER — HEPARIN SODIUM (PORCINE) 5000 UNIT/ML IJ SOLN
5000.0000 [IU] | Freq: Three times a day (TID) | INTRAMUSCULAR | Status: DC
  Administered 2014-03-19 – 2014-03-20 (×2): 5000 [IU] via SUBCUTANEOUS
  Filled 2014-03-19: qty 1

## 2014-03-19 MED ORDER — SODIUM CHLORIDE 0.9 % IV SOLN
INTRAVENOUS | Status: AC
  Filled 2014-03-19: qty 2.5

## 2014-03-19 MED ORDER — ONDANSETRON HCL 4 MG/2ML IJ SOLN: 4.0000 mg | Freq: Three times a day (TID) | INTRAMUSCULAR | Status: AC | PRN

## 2014-03-19 NOTE — ED Notes (Addendum)
Pt was in jail for 2 days and forgot to get his insulin before going in. Vomiting began last night. States CBG was "3-something" at the jail when taken by the nurse. Pt also states body aches.

## 2014-03-19 NOTE — ED Notes (Signed)
CRITICAL VALUE ALERT  Critical value received:PH 7.03, PCo2- 13.2, Po2 - 131, 98.9%, Bicarb- 3.3  Date of notification:  03/19/14  Time of notification:  4373  Critical value read back:Yes.    Nurse who received alert:  R. Marisabel Macpherson  MD notified (1st page):  1624  Time of first page:  1623  MD notified (2nd page): 1624  Time of second page:1624   Responding MD:  Dr Sabra Heck  Time MD responded:  [phone]

## 2014-03-19 NOTE — ED Provider Notes (Signed)
Pt admitted to Dr. Marily Memos - getting DKA tx, ABG with sig acidosis, large gap and ongoing tachycardia - improving with insuling and fluids.   EKG Interpretation  Date/Time:  Wednesday March 19 2014 18:15:19 EST Ventricular Rate:  122 PR Interval:  133 QRS Duration: 110 QT Interval:  344 QTC Calculation: 490 R Axis:   99 Text Interpretation:  Sinus tachycardia Consider right atrial enlargement Borderline right axis deviation RSR' in V1 or V2, probably normal variant ST elev, probable normal early repol pattern Prolonged QT interval Abnormal ekg since last tracing no significant change Confirmed by Sabra Heck  MD, Caprina Wussow ([id number]) on 03/19/2014 6:18:23 PM        Johnna Acosta, MD 03/19/14 1818

## 2014-03-19 NOTE — ED Provider Notes (Signed)
CSN: [id number]     Arrival date & time 03/19/14  1318 History   First MD Initiated Contact with Patient 03/19/14 1331     Chief Complaint  Patient presents with  . Emesis   LEVEL 5 CAVEAT DUE TO ALTERED MENTAL STATUS  Patient is a 19 y.o. male presenting with vomiting. The history is provided by the patient and a parent. The history is limited by the condition of the patient.  Emesis Severity:  Severe Timing:  Intermittent Progression:  Worsening Chronicity:  Recurrent Relieved by:  Nothing Worsened by:  Nothing tried Associated symptoms comment:  Fatigue Risk factors: diabetes   pt with h/o diabetes presents with vomiting over past 12 hrs He was placed in jail yesterday for parole violation.  He was unable to take all of his medications and his glucose became elevated and he began vomiting Mother reports he is vomiting and also very drowsy which is typical for him in DKA No recent trauma is reported  Past Medical History  Diagnosis Date  . ADHD (attention deficit hyperactivity disorder)   . DM type 1 (diabetes mellitus, type 1)     diagnosed age 6  . Herpes    Past Surgical History  Procedure Laterality Date  . None     No family history on file. History  Substance Use Topics  . Smoking status: Current Every Day Smoker -- 0.50 packs/day for 2 years    Types: Cigarettes  . Smokeless tobacco: Current User  . Alcohol Use: Yes     Comment: occassional- denies    Review of Systems  Unable to perform ROS: Mental status change  Gastrointestinal: Positive for vomiting.      Allergies  Sulfa antibiotics  Home Medications   Prior to Admission medications   Medication Sig Start Date End Date Taking? Authorizing Provider  doxycycline (VIBRA-TABS) 100 MG tablet Take 100 mg by mouth 2 (two) times daily. Started on 02/13/14 for 10 days    Historical Provider, MD  gabapentin (NEURONTIN) 300 MG capsule Take 1 capsule (300 mg total) by mouth at bedtime. 11/01/13    Mary-Margaret Hassell Done, FNP  insulin aspart (NOVOLOG FLEXPEN) 100 UNIT/ML FlexPen Inject 5-10 Units into the skin 3 (three) times daily with meals. 10/05/13   Kathie Dike, MD  Insulin Glargine (LANTUS) 100 UNIT/ML Solostar Pen Inject 48 units + 1 units as needed to prime pen (total of 49 units) once daily. 12/27/13   Tammy Eckard, PHARMD  Insulin Pen Needle 31G X 6 MM MISC To check BG 3x daily. Dx E10.10 03/18/14   Mary-Margaret Hassell Done, FNP  valACYclovir (VALTREX) 1000 MG tablet Take 1 tablet (1,000 mg total) by mouth 2 (two) times daily. 11/01/13   Mary-Margaret Hassell Done, FNP   BP 149/88 mmHg  Pulse 124  Temp(Src) 97.6 F (36.4 C) (Oral)  Resp 20  SpO2 100% Physical Exam CONSTITUTIONAL: Disheveled, drowsy, smells of ketones HEAD: Normocephalic/atraumatic EYES: EOMI/PERRL ENMT: Mucous membranes dry NECK: supple no meningeal signs SPINE/BACK:entire spine nontender CV: S1/S2 noted, no murmurs/rubs/gallops noted LUNGS: Lungs are clear to auscultation bilaterally, no apparent distress ABDOMEN: soft, nontender, no rebound or guarding, bowel sounds noted throughout abdomen NEURO: Pt is somnolent but easily arousable, moves all extremitiesx4.   EXTREMITIES: pulses normal/equal, full ROM SKIN: warm, color normal PSYCH: no abnormalities of mood noted, alert and oriented to situation   ED Course  Procedures   CRITICAL CARE Performed by: Sharyon Cable Total critical care time: 35 Critical care time was exclusive of separately  billable procedures and treating other patients. Critical care was necessary to treat or prevent imminent or life-threatening deterioration. Critical care was time spent personally by me on the following activities: development of treatment plan with patient and/or surrogate as well as nursing, discussions with consultants, evaluation of patient's response to treatment, examination of patient, obtaining history from patient or surrogate, ordering and performing treatments  and interventions, ordering and review of laboratory studies, ordering and review of radiographic studies, pulse oximetry and re-evaluation of patient's condition.   2:24 PM Pt in probable DKA due to medication noncompliance Labs pending at this time 4:07 PM Pt more awake/alert He reports recent mild cough and myalgias CXR performed and negative At time of signout, BMP pending Pt will need to be admitted D/w dr Sabra Heck will f/u on labs and call report  Labs Review Labs Reviewed  CBC WITH DIFFERENTIAL - Abnormal; Notable for the following:    WBC 37.0 (*)    RBC 6.13 (*)    Hemoglobin 19.3 (*)    HCT 57.0 (*)    Neutrophils Relative % 79 (*)    Neutro Abs 29.3 (*)    Lymphs Abs 5.1 (*)    Monocytes Absolute 2.4 (*)    Basophils Absolute 0.2 (*)    All other components within normal limits  URINALYSIS, ROUTINE W REFLEX MICROSCOPIC - Abnormal; Notable for the following:    Specific Gravity, Urine >1.030 (*)    Glucose, UA 500 (*)    Hgb urine dipstick SMALL (*)    Ketones, ur >80 (*)    Protein, ur 100 (*)    All other components within normal limits  URINE RAPID DRUG SCREEN (HOSP PERFORMED) - Abnormal; Notable for the following:    Tetrahydrocannabinol POSITIVE (*)    All other components within normal limits  CBG MONITORING, ED - Abnormal; Notable for the following:    Glucose-Capillary 532 (*)    All other components within normal limits  URINE MICROSCOPIC-ADD ON  BASIC METABOLIC PANEL  ETHANOL  BLOOD GAS, ARTERIAL    Imaging Review Dg Chest Portable 1 View  03/19/2014   CLINICAL DATA:  Hyperglycemia.  Body aches and vomiting for 2 days.  EXAM: PORTABLE CHEST - 1 VIEW  COMPARISON:  PA and lateral chest 02/16/2014 and 02/01/2014.  FINDINGS: Heart size and mediastinal contours are within normal limits. Both lungs are clear. Visualized skeletal structures are unremarkable.  IMPRESSION: Normal exam.   Electronically Signed   By: Inge Rise M.D.   On: 03/19/2014 16:03      Medications  insulin regular (NOVOLIN R,HUMULIN R) 250 Units in sodium chloride 0.9 % 250 mL (1 Units/mL) infusion (not administered)  sodium chloride 0.9 % bolus 1,000 mL (0 mLs Intravenous Stopped 03/19/14 1513)  ondansetron (ZOFRAN) injection 4 mg (4 mg Intravenous Given 03/19/14 1354)  sodium chloride 0.9 % bolus 1,000 mL (1,000 mLs Intravenous New Bag/Given 03/19/14 1513)    MDM   Final diagnoses:  None    Nursing notes including past medical history and social history reviewed and considered in documentation Labs/vital reviewed myself and considered during evaluation Previous records reviewed and considered     Sharyon Cable, MD 03/19/14 [phone]

## 2014-03-19 NOTE — H&P (Signed)
[facility] History and Physical  ARVIN ABELLO EXH:[id number] DOB: Sep 16, 1995 DOA: 03/19/2014  Referring physician: Dr Sabra Heck - [facility] PCP: Chevis Pretty, FNP   Chief Complaint: nausea and vomiting  HPI: Pedro [name] is a 19 y.o. male  Level 5 Caveat - altered mental status  Pt able to communicate in limited amount due to current illness. Pt oriented x3 when awake.  Per report and limited information from pt. Nausea and vomiting started 12 hrs ago. Pt in jail yesterday and w/o his insulin for the day. Family reports pt also very drowsy during this time. Currently states "everything hurts." Mother states that this is his usual DKA presentation.  Additionally pt received fentanyl shortly before admission exam making him more sleepy  Review of Systems:  Per HPI. Unable to obtain further information due to pts mental status   Past Medical History  Diagnosis Date  . ADHD (attention deficit hyperactivity disorder)   . DM type 1 (diabetes mellitus, type 1)     diagnosed age 56  . Herpes    Past Surgical History  Procedure Laterality Date  . None     Social History:  reports that he has been smoking Cigarettes.  He has a 1 pack-year smoking history. He uses smokeless tobacco. He reports that he drinks alcohol. He reports that he uses illicit drugs (Marijuana, Other-see comments, Benzodiazepines, Hydrocodone, and Oxycodone).  Allergies  Allergen Reactions  . Sulfa Antibiotics Hives    fever    Family History  Problem Relation Age of Onset  . Diabetes Mellitus I Paternal Grandmother   . Diabetes Mellitus I Paternal Aunt      Prior to Admission medications   Medication Sig Start Date End Date Taking? Authorizing Provider  insulin aspart (NOVOLOG FLEXPEN) 100 UNIT/ML FlexPen Inject 5-10 Units into the skin 3 (three) times daily with meals. 10/05/13  Yes Kathie Dike, MD  Insulin Glargine (LANTUS) 100 UNIT/ML Solostar Pen Inject 48 units + 1 units as needed to  prime pen (total of 49 units) once daily. 12/27/13  Yes Tammy Eckard, PHARMD  gabapentin (NEURONTIN) 300 MG capsule Take 1 capsule (300 mg total) by mouth at bedtime. Patient not taking: Reported on 03/19/2014 11/01/13   Mary-Margaret Hassell Done, FNP  Insulin Pen Needle 31G X 6 MM MISC To check BG 3x daily. Dx E10.10 Patient not taking: Reported on 03/19/2014 03/18/14   Mary-Margaret Hassell Done, FNP  valACYclovir (VALTREX) 1000 MG tablet Take 1 tablet (1,000 mg total) by mouth 2 (two) times daily. Patient not taking: Reported on 03/19/2014 11/01/13   Mary-Margaret Hassell Done, FNP   Physical Exam: Filed Vitals:   03/19/14 1430 03/19/14 1500 03/19/14 1536 03/19/14 1600  BP: 141/85 134/68 147/85 138/83  Pulse: 124  122 138  Temp:   97.5 F (36.4 C)   TempSrc:   Oral   Resp:   25   SpO2: 100%  100% 100%    Wt Readings from Last 3 Encounters:  02/18/14 61.7 kg (136 lb 0.4 oz)  02/14/14 65.318 kg (144 lb)  12/15/13 63.5 kg (139 lb 15.9 oz)    General: lying in bed breathing rapidly in distress Eyes: normal lids, irises & conjunctiva ENT: dry mucus membranes Neck:  no LAD, masses or thyromegaly Cardiovascular:  Tachycardic, no m/r/g. No LE edema. Telemetry:  SR, no arrhythmias  Respiratory:  CTA bilaterally, no w/r/r. Normal respiratory effort. Abdomen:  soft, ntnd Skin:  no rash or induration seen on limited exam Musculoskeletal:  grossly normal tone BUE/BLE  Psychiatric: Altered as above  Neurologic: moves all extremities spontaneously. Follows commands          Labs on Admission:  Basic Metabolic Panel:  Recent Labs Lab 03/19/14 1348  NA 133*  K 5.2*  CL 98  CO2 9*  GLUCOSE 509*  BUN 25*  CREATININE 1.30  CALCIUM 9.9   Liver Function Tests: No results for input(s): AST, ALT, ALKPHOS, BILITOT, PROT, ALBUMIN in the last 168 hours. No results for input(s): LIPASE, AMYLASE in the last 168 hours. No results for input(s): AMMONIA in the last 168 hours. CBC:  Recent Labs Lab  03/19/14 1348  WBC 37.0*  NEUTROABS 29.3*  HGB 19.3*  HCT 57.0*  MCV 93.0  PLT 355   Cardiac Enzymes: No results for input(s): CKTOTAL, CKMB, CKMBINDEX, TROPONINI in the last 168 hours.  BNP (last 3 results) No results for input(s): PROBNP in the last 8760 hours. CBG:  Recent Labs Lab 03/19/14 1337 03/19/14 1622  GLUCAP 532* 469*    Radiological Exams on Admission: Dg Chest Portable 1 View  03/19/2014   CLINICAL DATA:  Hyperglycemia.  Body aches and vomiting for 2 days.  EXAM: PORTABLE CHEST - 1 VIEW  COMPARISON:  PA and lateral chest 02/16/2014 and 02/01/2014.  FINDINGS: Heart size and mediastinal contours are within normal limits. Both lungs are clear. Visualized skeletal structures are unremarkable.  IMPRESSION: Normal exam.   Electronically Signed   By: Inge Rise M.D.   On: 03/19/2014 16:03    EKG: Pending  Assessment/Plan Principal Problem:   DKA (diabetic ketoacidoses) Active Problems:   Hyperkalemia   Leukocytosis   Tobacco abuse   Severe DKA: type 1 Diabetic.  Glucose 509 on admission. ABG pH 7.03, CO2 13, O2 131, Bicarb 3.3. Anion gap 26. Urine ketones 80 and glucose 500. Acute respiratory compensation for metabolic acidosis. Last A1c 11.4 in 2014.Home regimen of lantus 48 units Qday and SSI novolog. - Admit to stepdown - Glucose stabilizer - A1c - BMET Q4 - NS 18ml/hr then transition to 1/2NS D5 after glucose drops below 250  Leukocytosis: WBC 37. CXR negative for acute process. Likley from severe DKA. No clear evidence for infectious source.  - Lactic acid - CBC in am - BCX  Tachycardia: likely from DKA and dehydration - EKG  - Tele  Hyperkalemia: 5.2 on admission. Likely from DKA. Anticipate rapid decline once acidotic state resolves.  - trend BMETs and start repleation as normalizes.   Neuropathy:  - continue home neurontin on 03/20/14  Polysubstance abuse: UDS + for THC. Chronic h/o abuse in past. ETOH neg. Smokes 0.5ppd - monitor for  withdrawel - nicotine patch   Code Status: FULL DVT Prophylaxis: Heparin Family Communication: Mother Disposition Plan: pending improvement    Hemi Chacko, [place] [facility] [email or web address] Password [email or web address]

## 2014-03-19 NOTE — ED Notes (Signed)
Critical CO2: 9  Called by Sherri at 1615  Told immediately to Dr Sabra Heck

## 2014-03-20 LAB — BASIC METABOLIC PANEL
ANION GAP: 7 (ref 5–15)
ANION GAP: 7 (ref 5–15)
Anion gap: 10 (ref 5–15)
Anion gap: 12 (ref 5–15)
Anion gap: 7 (ref 5–15)
BUN: 19 mg/dL (ref 6–23)
BUN: 20 mg/dL (ref 6–23)
BUN: 18 mg/dL (ref 6–23)
BUN: 16 mg/dL (ref 6–23)
BUN: 14 mg/dL (ref 6–23)
CALCIUM: 8.8 mg/dL (ref 8.4–10.5)
CALCIUM: 8.5 mg/dL (ref 8.4–10.5)
CHLORIDE: 108 meq/L (ref 96–112)
CHLORIDE: 111 meq/L (ref 96–112)
CHLORIDE: 104 meq/L (ref 96–112)
CO2: 12 mmol/L — AB (ref 19–32)
CO2: 14 mmol/L — ABNORMAL LOW (ref 19–32)
CO2: 18 mmol/L — ABNORMAL LOW (ref 19–32)
CO2: 19 mmol/L (ref 19–32)
CO2: 21 mmol/L (ref 19–32)
CREATININE: 0.96 mg/dL (ref 0.50–1.35)
Calcium: 8.6 mg/dL (ref 8.4–10.5)
Calcium: 8.9 mg/dL (ref 8.4–10.5)
Calcium: 8.8 mg/dL (ref 8.4–10.5)
Chloride: 111 mEq/L (ref 96–112)
Chloride: 108 mEq/L (ref 96–112)
Creatinine, Ser: 0.86 mg/dL (ref 0.50–1.35)
Creatinine, Ser: 0.73 mg/dL (ref 0.50–1.35)
Creatinine, Ser: 0.78 mg/dL (ref 0.50–1.35)
Creatinine, Ser: 0.79 mg/dL (ref 0.50–1.35)
GFR calc Af Amer: >90 mL/min (ref >90)
GFR calc Af Amer: >90 mL/min (ref >90)
GFR calc non Af Amer: >90 mL/min (ref >90)
GFR calc non Af Amer: >90 mL/min (ref >90)
GFR calc non Af Amer: >90 mL/min (ref >90)
GFR calc non Af Amer: >90 mL/min (ref >90)
GLUCOSE: 118 mg/dL — AB (ref 70–99)
GLUCOSE: 97 mg/dL (ref 70–99)
GLUCOSE: 189 mg/dL — AB (ref 70–99)
GLUCOSE: 138 mg/dL — AB (ref 70–99)
Glucose, Bld: 230 mg/dL — ABNORMAL HIGH (ref 70–99)
POTASSIUM: 3.4 mmol/L — AB (ref 3.5–5.1)
POTASSIUM: 2.9 mmol/L — AB (ref 3.5–5.1)
Potassium: 4.1 mmol/L (ref 3.5–5.1)
Potassium: 3.6 mmol/L (ref 3.5–5.1)
Potassium: 3.3 mmol/L — ABNORMAL LOW (ref 3.5–5.1)
SODIUM: 134 mmol/L — AB (ref 135–145)
SODIUM: 136 mmol/L (ref 135–145)
SODIUM: 134 mmol/L — AB (ref 135–145)
SODIUM: 132 mmol/L — AB (ref 135–145)
Sodium: 133 mmol/L — ABNORMAL LOW (ref 135–145)

## 2014-03-20 LAB — COMPREHENSIVE METABOLIC PANEL
ALT: 10 U/L (ref 0–53)
ANION GAP: 14 (ref 5–15)
AST: 8 U/L (ref 0–37)
Albumin: 3.9 g/dL (ref 3.5–5.2)
Alkaline Phosphatase: 83 U/L (ref 39–117)
BUN: 20 mg/dL (ref 6–23)
CALCIUM: 8.8 mg/dL (ref 8.4–10.5)
CHLORIDE: 110 meq/L (ref 96–112)
CO2: 11 mmol/L — ABNORMAL LOW (ref 19–32)
Creatinine, Ser: 0.92 mg/dL (ref 0.50–1.35)
GFR calc Af Amer: >90 mL/min (ref >90)
GFR calc non Af Amer: >90 mL/min (ref >90)
GLUCOSE: 183 mg/dL — AB (ref 70–99)
POTASSIUM: 4.1 mmol/L (ref 3.5–5.1)
Sodium: 135 mmol/L (ref 135–145)
TOTAL PROTEIN: 6.5 g/dL (ref 6.0–8.3)
Total Bilirubin: 1.5 mg/dL — ABNORMAL HIGH (ref 0.3–1.2)

## 2014-03-20 LAB — GLUCOSE, CAPILLARY
GLUCOSE-CAPILLARY: 110 mg/dL — AB (ref 70–99)
GLUCOSE-CAPILLARY: 119 mg/dL — AB (ref 70–99)
GLUCOSE-CAPILLARY: 132 mg/dL — AB (ref 70–99)
GLUCOSE-CAPILLARY: 136 mg/dL — AB (ref 70–99)
GLUCOSE-CAPILLARY: 141 mg/dL — AB (ref 70–99)
GLUCOSE-CAPILLARY: 150 mg/dL — AB (ref 70–99)
GLUCOSE-CAPILLARY: 155 mg/dL — AB (ref 70–99)
GLUCOSE-CAPILLARY: 156 mg/dL — AB (ref 70–99)
GLUCOSE-CAPILLARY: 158 mg/dL — AB (ref 70–99)
GLUCOSE-CAPILLARY: 159 mg/dL — AB (ref 70–99)
GLUCOSE-CAPILLARY: 166 mg/dL — AB (ref 70–99)
GLUCOSE-CAPILLARY: 178 mg/dL — AB (ref 70–99)
GLUCOSE-CAPILLARY: 188 mg/dL — AB (ref 70–99)
GLUCOSE-CAPILLARY: 191 mg/dL — AB (ref 70–99)
GLUCOSE-CAPILLARY: 220 mg/dL — AB (ref 70–99)
GLUCOSE-CAPILLARY: 227 mg/dL — AB (ref 70–99)
GLUCOSE-CAPILLARY: 266 mg/dL — AB (ref 70–99)
GLUCOSE-CAPILLARY: 90 mg/dL (ref 70–99)
Glucose-Capillary: 94 mg/dL (ref 70–99)
Glucose-Capillary: 201 mg/dL — ABNORMAL HIGH (ref 70–99)
Glucose-Capillary: 197 mg/dL — ABNORMAL HIGH (ref 70–99)
Glucose-Capillary: 161 mg/dL — ABNORMAL HIGH (ref 70–99)
Glucose-Capillary: 105 mg/dL — ABNORMAL HIGH (ref 70–99)
Glucose-Capillary: 90 mg/dL (ref 70–99)

## 2014-03-20 MED ORDER — ACETAMINOPHEN 325 MG PO TABS
650.0000 mg | ORAL_TABLET | ORAL | Status: DC | PRN
  Administered 2014-03-20 – 2014-03-21 (×3): 650 mg via ORAL
  Filled 2014-03-20 (×2): qty 2

## 2014-03-20 NOTE — Progress Notes (Signed)
[facility] PROGRESS NOTE  Pedro [name] OVF:[id number] DOB: [birth year] DOA: 03/19/2014 PCP: Chevis Pretty, FNP  Assessment/Plan: DKA -Still present with a bicarb of 11 and a gap of 14 on most recent BMET. -In my experience, DKA resolves quicker while NPO, so will revert back to non-caloric liquids only. -Continue to follow q4 hour BMETs to determine when appropriate to transition off insulin drip. -States he had been in prison for 2 days and had not received any insulin.  Leukocytosis -Likely related to DKA. -recheck in am. -No clear infectious source identified.  Hyperkalemia -Resolved. -2/2 DKA and acidosis.  Code Status: Full Code Family Communication: patient only  Disposition Plan: Home when ready   Consultants:  None   Antibiotics:  None   Subjective: Flat affect. No complaints. N/V improved.  Objective: Filed Vitals:   03/20/14 0600 03/20/14 0700 03/20/14 0751 03/20/14 0800  BP:  110/69  126/66  Pulse: 101 102  115  Temp:   98.4 F (36.9 C)   TempSrc:   Oral   Resp: 17 17  19   Height:      Weight:      SpO2: 99% 99%  99%    Intake/Output Summary (Last 24 hours) at 03/20/14 0929 Last data filed at 03/20/14 0900  Gross per 24 hour  Intake 1533.67 ml  Output   2100 ml  Net -566.33 ml   Filed Weights   03/19/14 1842 03/20/14 0500  Weight: 62.3 kg (137 lb 5.6 oz) 62.4 kg (137 lb 9.1 oz)    Exam:   General:  AA Ox3  Cardiovascular: Tachy, regular  Respiratory: CTA B  Abdomen: S/NT/ND/+BS  Extremities: no C/C/E   Neurologic:  Intact/non-focal  Data Reviewed: Basic Metabolic Panel:  Recent Labs Lab 03/19/14 1348 03/19/14 1358 03/19/14 2057 03/20/14 0112 03/20/14 0502  NA 133*  --  134* 133* 135  K 5.2*  --  4.7 4.1 4.1  CL 98  --  113* 111 110  CO2 9*  --  7* 12* 11*  GLUCOSE 509*  --  244* 118* 183*  BUN 25*  --  21 19 20   CREATININE 1.30  --  1.00 0.96 0.92  CALCIUM 9.9  --  8.4 8.6 8.8  MG  --   2.2  --   --   --    Liver Function Tests:  Recent Labs Lab 03/20/14 0502  AST 8  ALT 10  ALKPHOS 83  BILITOT 1.5*  PROT 6.5  ALBUMIN 3.9   No results for input(s): LIPASE, AMYLASE in the last 168 hours. No results for input(s): AMMONIA in the last 168 hours. CBC:  Recent Labs Lab 03/19/14 1348  WBC 37.0*  NEUTROABS 29.3*  HGB 19.3*  HCT 57.0*  MCV 93.0  PLT 355   Cardiac Enzymes: No results for input(s): CKTOTAL, CKMB, CKMBINDEX, TROPONINI in the last 168 hours. BNP (last 3 results) No results for input(s): PROBNP in the last 8760 hours. CBG:  Recent Labs Lab 03/20/14 0506 03/20/14 0548 03/20/14 0645 03/20/14 0740 03/20/14 0846  GLUCAP 178* 227* 266* 201* 220*    Recent Results (from the past 240 hour(s))  MRSA PCR Screening     Status: None   Collection Time: 03/19/14  6:45 PM  Result Value Ref Range Status   MRSA by PCR NEGATIVE NEGATIVE Final    Comment:        The GeneXpert MRSA Assay (FDA approved for NASAL specimens only), is one component of a  comprehensive MRSA colonization surveillance program. It is not intended to diagnose MRSA infection nor to guide or monitor treatment for MRSA infections.      Studies: Dg Chest Portable 1 View  03/19/2014   CLINICAL DATA:  Hyperglycemia.  Body aches and vomiting for 2 days.  EXAM: PORTABLE CHEST - 1 VIEW  COMPARISON:  PA and lateral chest 02/16/2014 and 02/01/2014.  FINDINGS: Heart size and mediastinal contours are within normal limits. Both lungs are clear. Visualized skeletal structures are unremarkable.  IMPRESSION: Normal exam.   Electronically Signed   By: Inge Rise M.D.   On: 03/19/2014 16:03    Scheduled Meds: . sodium chloride   Intravenous STAT  . gabapentin  300 mg Oral QHS  . heparin  5,000 Units Subcutaneous 3 times per day  . nicotine  7 mg Transdermal Daily   Continuous Infusions: . sodium chloride 125 mL/hr at 03/20/14 0300  . dextrose 5 % and 0.45% NaCl 125 mL/hr at  03/20/14 0845  . insulin (NOVOLIN-R) infusion 8 mL/hr at 03/20/14 0845    Principal Problem:   DKA (diabetic ketoacidoses) Active Problems:   Hyperkalemia   Leukocytosis   Tobacco abuse   Diabetic neuropathy    Time spent: 35 minutes. Greater than 50% of this time was spent in direct contact with the patient coordinating care.    Lelon Frohlich  [facility] Pager [phone]  If 7PM-7AM, please contact night-coverage at [email or web address], password [facility] 03/20/2014, 9:29 AM  LOS: 1 day

## 2014-03-20 NOTE — Care Management Note (Signed)
    Page 1 of 1   03/20/2014     1:12:33 PM CARE MANAGEMENT NOTE 03/20/2014  Patient:  Pedro [name], Pedro [name]   Account Number:  [id number]  Date Initiated:  03/20/2014  Documentation initiated by:  Jolene Provost  Subjective/Objective Assessment:   Pt admitted with DKA. Pt is from home, lives with mother. Pt plans to return home at [place]. No CM needs at this time.     Action/Plan:   Anticipated DC Date:  03/22/2014   Anticipated DC Plan:  [place]  CM consult      Choice offered to / List presented to:             Status of service:  Completed, signed off Medicare Important Message given?   (If response is "NO", the following Medicare IM given date fields will be blank) Date Medicare IM given:   Medicare IM given by:   Date Additional Medicare IM given:   Additional Medicare IM given by:    Discharge Disposition:  HOME/SELF CARE  Per UR Regulation:    If discussed at Long Length of Stay Meetings, dates discussed:    Comments:  03/20/2014 [place], RN, MSN, [facility]

## 2014-03-20 NOTE — Care Management Utilization Note (Signed)
UR completed 

## 2014-03-21 DIAGNOSIS — E0849 Diabetes mellitus due to underlying condition with other diabetic neurological complication: Secondary | ICD-10-CM

## 2014-03-21 LAB — GLUCOSE, CAPILLARY
GLUCOSE-CAPILLARY: 134 mg/dL — AB (ref 70–99)
GLUCOSE-CAPILLARY: 224 mg/dL — AB (ref 70–99)
Glucose-Capillary: 104 mg/dL — ABNORMAL HIGH (ref 70–99)
Glucose-Capillary: 127 mg/dL — ABNORMAL HIGH (ref 70–99)
Glucose-Capillary: 247 mg/dL — ABNORMAL HIGH (ref 70–99)

## 2014-03-21 LAB — CBC
HCT: 40.7 % (ref 39.0–52.0)
Hemoglobin: 13.8 g/dL (ref 13.0–17.0)
MCH: 30.3 pg (ref 26.0–34.0)
MCHC: 33.9 g/dL (ref 30.0–36.0)
MCV: 89.5 fL (ref 78.0–100.0)
Platelets: 226 10*3/uL (ref 150–400)
RBC: 4.55 MIL/uL (ref 4.22–5.81)
RDW: 12.7 % (ref 11.5–15.5)
WBC: 10.9 10*3/uL — AB (ref 4.0–10.5)

## 2014-03-21 LAB — BASIC METABOLIC PANEL
ANION GAP: 11 (ref 5–15)
BUN: 16 mg/dL (ref 6–23)
CALCIUM: 8.8 mg/dL (ref 8.4–10.5)
CHLORIDE: 104 meq/L (ref 96–112)
CO2: 20 mmol/L (ref 19–32)
CREATININE: 0.72 mg/dL (ref 0.50–1.35)
GFR calc Af Amer: >90 mL/min (ref >90)
GFR calc non Af Amer: >90 mL/min (ref >90)
Glucose, Bld: 251 mg/dL — ABNORMAL HIGH (ref 70–99)
Potassium: 3.8 mmol/L (ref 3.5–5.1)
SODIUM: 135 mmol/L (ref 135–145)

## 2014-03-21 MED ORDER — INSULIN PEN NEEDLE 31G X 6 MM MISC: Status: DC

## 2014-03-21 MED ORDER — INSULIN GLARGINE 100 UNIT/ML ~~LOC~~ SOLN
20.0000 [IU] | Freq: Once | SUBCUTANEOUS | Status: AC
  Administered 2014-03-21: 20 [IU] via SUBCUTANEOUS
  Filled 2014-03-21: qty 0.2

## 2014-03-21 MED ORDER — SODIUM CHLORIDE 0.9 % IV SOLN
INTRAVENOUS | Status: DC
  Administered 2014-03-21: 03:00:00 via INTRAVENOUS

## 2014-03-21 MED ORDER — POTASSIUM CHLORIDE CRYS ER 20 MEQ PO TBCR
40.0000 meq | EXTENDED_RELEASE_TABLET | Freq: Once | ORAL | Status: AC
  Administered 2014-03-21: 40 meq via ORAL
  Filled 2014-03-21: qty 2

## 2014-03-21 MED ORDER — POTASSIUM CHLORIDE 10 MEQ/100ML IV SOLN
10.0000 meq | INTRAVENOUS | Status: AC
  Administered 2014-03-21 (×3): 10 meq via INTRAVENOUS
  Filled 2014-03-21 (×3): qty 100

## 2014-03-21 MED ORDER — INSULIN ASPART 100 UNIT/ML ~~LOC~~ SOLN
0.0000 [IU] | Freq: Three times a day (TID) | SUBCUTANEOUS | Status: DC
  Administered 2014-03-21 (×2): 5 [IU] via SUBCUTANEOUS

## 2014-03-21 NOTE — Progress Notes (Signed)
Discharge orders received; instructions given along with prescriptions to pt; medications reviewed; understanding verbalized. Pt left via wheelchair with RN and family member; VSS; all belongings taken with pt.

## 2014-03-21 NOTE — Discharge Summary (Signed)
Physician Discharge Summary  Pedro [name] [id number]:[id number] DOB: Jan 17, 1996 DOA: 03/19/2014  PCP: Chevis Pretty, FNP  Admit date: 03/19/2014 Discharge date: 03/21/2014  Time spent: 45 minutes  Recommendations for Outpatient Follow-up:  -Will be discharged home today. -Advised to follow up with his PCP in 1 week for further insulin titration and diabetes management.   Discharge Diagnoses:  Principal Problem:   DKA (diabetic ketoacidoses) Active Problems:   Hyperkalemia   Leukocytosis   Tobacco abuse   Diabetic neuropathy   Discharge Condition: Stable and improved  Filed Weights   03/19/14 1842 03/20/14 0500 03/21/14 0500  Weight: 62.3 kg (137 lb 5.6 oz) 62.4 kg (137 lb 9.1 oz) 63.1 kg (139 lb 1.8 oz)    History of present illness:  Nausea and vomiting started 12 hrs ago. Pt in jail yesterday and w/o his insulin for the day. Family reports pt also very drowsy during this time. Currently states "everything hurts." Mother states that this is his usual DKA presentation.  Additionally pt received fentanyl shortly before admission exam making him more sleepy. We were asked to admit him for further evaluation and management.  Hospital Course:   DKA -Resolved. -Will transition back to his home regimen of lantus 48 units and novolog SSI. -Advised to be compliant with insulin to avoid further admission for DKA (per patient he had been in jail and the prison officials did not give him his insulin for 1.5 days).  Leukocytosis -Resolved. -2/2 DKA.  Hyperkalemia -Resolved. -2/2 DKA and metabolic acidosis.  Procedures:  None   Consultations:  None  Discharge Instructions  Discharge Instructions    Diet Carb Modified    Complete by:  As directed      Increase activity slowly    Complete by:  As directed             Medication List    STOP taking these medications        valACYclovir 1000 MG tablet  Commonly known as:  VALTREX      TAKE these  medications        gabapentin 300 MG capsule  Commonly known as:  NEURONTIN  Take 1 capsule (300 mg total) by mouth at bedtime.     insulin aspart 100 UNIT/ML FlexPen  Commonly known as:  NOVOLOG FLEXPEN  Inject 5-10 Units into the skin 3 (three) times daily with meals.     Insulin Glargine 100 UNIT/ML Solostar Pen  Commonly known as:  LANTUS  Inject 48 units + 1 units as needed to prime pen (total of 49 units) once daily.     Insulin Pen Needle 31G X 6 MM Misc  To check BG 3x daily. Dx E10.10       Allergies  Allergen Reactions  . Sulfa Antibiotics Hives    fever       Follow-up Information    Follow up with Chevis Pretty, FNP. Schedule an appointment as soon as possible for a visit in 1 week.   Specialty:  Nurse Practitioner   Contact information:   [place] [phone]        The results of significant diagnostics from this hospitalization (including imaging, microbiology, ancillary and laboratory) are listed below for reference.    Significant Diagnostic Studies: Dg Chest Portable 1 View  03/19/2014   CLINICAL DATA:  Hyperglycemia.  Body aches and vomiting for 2 days.  EXAM: PORTABLE CHEST - 1 VIEW  COMPARISON:  PA and  lateral chest 02/16/2014 and 02/01/2014.  FINDINGS: Heart size and mediastinal contours are within normal limits. Both lungs are clear. Visualized skeletal structures are unremarkable.  IMPRESSION: Normal exam.   Electronically Signed   By: Inge Rise M.D.   On: 03/19/2014 16:03    Microbiology: Recent Results (from the past 240 hour(s))  Culture, blood (routine x 2)     Status: None (Preliminary result)   Collection Time: 03/19/14  5:16 PM  Result Value Ref Range Status   Specimen Description BLOOD RIGHT ANTECUBITAL  Final   Special Requests   Final    BOTTLES DRAWN AEROBIC AND ANAEROBIC AEB=8CC ANA=6CC   Culture NO GROWTH 1 DAY  Final   Report Status PENDING  Incomplete  Culture, blood (routine x  2)     Status: None (Preliminary result)   Collection Time: 03/19/14  5:30 PM  Result Value Ref Range Status   Specimen Description BLOOD LEFT HAND  Final   Special Requests BOTTLES DRAWN AEROBIC AND ANAEROBIC 5CC  Final   Culture NO GROWTH 1 DAY  Final   Report Status PENDING  Incomplete  MRSA PCR Screening     Status: None   Collection Time: 03/19/14  6:45 PM  Result Value Ref Range Status   MRSA by PCR NEGATIVE NEGATIVE Final    Comment:        The GeneXpert MRSA Assay (FDA approved for NASAL specimens only), is one component of a comprehensive MRSA colonization surveillance program. It is not intended to diagnose MRSA infection nor to guide or monitor treatment for MRSA infections.      Labs: Basic Metabolic Panel:  Recent Labs Lab 03/19/14 1358  03/20/14 0855 03/20/14 1319 03/20/14 1935 03/20/14 2312 03/21/14 0700  NA  --   < > 134* 136 134* 132* 135  K  --   < > 3.6 3.4* 3.3* 2.9* 3.8  CL  --   < > 108 111 108 104 104  CO2  --   < > 14* 18* 19 21 20   GLUCOSE  --   < > 230* 97 189* 138* 251*  BUN  --   < > 20 18 16 14 16   CREATININE  --   < > 0.86 0.73 0.78 0.79 0.72  CALCIUM  --   < > 8.8 8.9 8.8 8.5 8.8  MG 2.2  --   --   --   --   --   --   < > = values in this interval not displayed. Liver Function Tests:  Recent Labs Lab 03/20/14 0502  AST 8  ALT 10  ALKPHOS 83  BILITOT 1.5*  PROT 6.5  ALBUMIN 3.9   No results for input(s): LIPASE, AMYLASE in the last 168 hours. No results for input(s): AMMONIA in the last 168 hours. CBC:  Recent Labs Lab 03/19/14 1348 03/21/14 0700  WBC 37.0* 10.9*  NEUTROABS 29.3*  --   HGB 19.3* 13.8  HCT 57.0* 40.7  MCV 93.0 89.5  PLT 355 226   Cardiac Enzymes: No results for input(s): CKTOTAL, CKMB, CKMBINDEX, TROPONINI in the last 168 hours. BNP: BNP (last 3 results) No results for input(s): PROBNP in the last 8760 hours. CBG:  Recent Labs Lab 03/20/14 2341 03/21/14 0046 03/21/14 0146 03/21/14 0236  03/21/14 0808  GLUCAP 141* 104* 127* 134* 247*       Signed:  Lelon Frohlich  [facility] Pager: [phone] 03/21/2014, 10:07 AM

## 2014-03-21 NOTE — Progress Notes (Addendum)
Inpatient Diabetes Program Recommendations  AACE/ADA: New Consensus Statement on Inpatient Glycemic Control (2013)  Target Ranges:  Prepandial:   less than 140 mg/dL      Peak postprandial:   less than 180 mg/dL (1-2 hours)      Critically ill patients:  140 - 180 mg/dL    Results for Pedro [name], Pedro [name] (MRN [id number]) as of 03/21/2014 07:58  Ref. Range 03/21/2014 07:00  Sodium Latest Range: 135-145 mmol/L 135  Potassium Latest Range: 3.5-5.1 mmol/L 3.8  Chloride Latest Range: 96-112 mEq/L 104  CO2 Latest Range: 19-32 mmol/L 20  BUN Latest Range: 6-23 mg/dL 16  Creatinine Latest Range: 0.50-1.35 mg/dL 0.72  Calcium Latest Range: 8.4-10.5 mg/dL 8.8  GFR calc non Af Amer Latest Range: >90 mL/min >90  GFR calc Af Amer Latest Range: >90 mL/min >90  Glucose Latest Range: 70-99 mg/dL 251 (H)  Anion gap Latest Range: 5-15  11     Admitted with DKA after not receiving insulin in jail for 1.5 days.  Glucose 509, CO2 9 on admission.  Given IVF and started on IV insulin drip in the ED.  Home DM Meds: Lantus 48 units QD        Novolog 5-10 units tid   Current Orders: Novolog Moderate SSI    **Note patient was given 20 units Lantus at 2:30am to transition off the IV insulin drip  **AM lab glucose at 7am today 251 mg/dl  **Note patient was admitted for DKA back in December.  Patient was counseled extensively by the DM Coordinator on 02/17/14 about the importance of good CBG control at home and the importance of preventing DKA  **Note patient positive for [facility] this admission as well     MD- Please make sure to add a scheduled dose of Lantus for today.   Recommend we give patient an additional dose of Lantus this morning.   Not sure we should give patient his whole home dose of Lantus as this seems like a very large dose of basal insulin.   Recommend Lantus 20 units X one dose this morning and then restart 75% of patient's home dose of Lantus tomorrow AM (01/16)- Lantus 35 units  daily.   May also want to add Novolog Meal Coverage as well- Could start with Novolog 4 units tid with meals (Hold if patient eats <50% of meal)     Will follow Wyn Quaker RN, MSN, CDE Diabetes Coordinator Inpatient Diabetes Program Team Pager: [phone] (8a-10p)

## 2014-03-24 LAB — CULTURE, BLOOD (ROUTINE X 2)
CULTURE: NO GROWTH
CULTURE: NO GROWTH

## 2014-04-02 ENCOUNTER — Other Ambulatory Visit: Payer: Self-pay | Admitting: Nurse Practitioner

## 2014-04-03 NOTE — Telephone Encounter (Signed)
Last seen 11/01/13 MMM

## 2014-04-03 NOTE — Telephone Encounter (Signed)
no more refills without being seen  

## 2014-04-08 ENCOUNTER — Ambulatory Visit (INDEPENDENT_AMBULATORY_CARE_PROVIDER_SITE_OTHER): Payer: Medicaid Other | Admitting: Family Medicine

## 2014-04-08 ENCOUNTER — Encounter: Payer: Self-pay | Admitting: Family Medicine

## 2014-04-08 VITALS — BP 128/82 | HR 105 | Temp 98.0°F | Ht 71.0 in | Wt 146.0 lb

## 2014-04-08 DIAGNOSIS — E0843 Diabetes mellitus due to underlying condition with diabetic autonomic (poly)neuropathy: Secondary | ICD-10-CM

## 2014-04-08 DIAGNOSIS — L700 Acne vulgaris: Secondary | ICD-10-CM

## 2014-04-08 DIAGNOSIS — A6000 Herpesviral infection of urogenital system, unspecified: Secondary | ICD-10-CM

## 2014-04-08 DIAGNOSIS — E1011 Type 1 diabetes mellitus with ketoacidosis with coma: Secondary | ICD-10-CM

## 2014-04-08 LAB — POCT CBC
Granulocyte percent: 75.7 %G (ref 37–80)
HCT, POC: 51.9 % (ref 43.5–53.7)
Hemoglobin: 16.4 g/dL (ref 14.1–18.1)
Lymph, poc: 2.6 (ref 0.6–3.4)
MCH, POC: 29.1 pg (ref 27–31.2)
MCHC: 31.6 g/dL — AB (ref 31.8–35.4)
MCV: 92.1 fL (ref 80–97)
MPV: 8.2 fL (ref 0–99.8)
POC Granulocyte: 8.7 — AB (ref 2–6.9)
POC LYMPH PERCENT: 22.4 %L (ref 10–50)
Platelet Count, POC: 285 10*3/uL (ref 142–424)
RBC: 5.6 M/uL (ref 4.69–6.13)
RDW, POC: 13.5 %
WBC: 11.5 10*3/uL — AB (ref 4.6–10.2)

## 2014-04-08 LAB — POCT URINALYSIS DIPSTICK
Bilirubin, UA: NEGATIVE
Glucose, UA: 250
Ketones, UA: NEGATIVE
Leukocytes, UA: NEGATIVE
Nitrite, UA: NEGATIVE
Protein, UA: NEGATIVE
Spec Grav, UA: 1.01
Urobilinogen, UA: NEGATIVE
pH, UA: 6.5

## 2014-04-08 LAB — GLUCOSE, POCT (MANUAL RESULT ENTRY): POC Glucose: 192 mg/dl — AB (ref 70–99)

## 2014-04-08 LAB — POCT UA - MICROSCOPIC ONLY
Bacteria, U Microscopic: NEGATIVE
Casts, Ur, LPF, POC: NEGATIVE
Crystals, Ur, HPF, POC: NEGATIVE
Mucus, UA: NEGATIVE
WBC, Ur, HPF, POC: NEGATIVE
Yeast, UA: NEGATIVE

## 2014-04-08 LAB — POCT GLYCOSYLATED HEMOGLOBIN (HGB A1C): Hemoglobin A1C: 10.2

## 2014-04-08 MED ORDER — INSULIN ASPART 100 UNIT/ML FLEXPEN: 5.0000 [IU] | PEN_INJECTOR | Freq: Three times a day (TID) | SUBCUTANEOUS | Status: DC

## 2014-04-08 MED ORDER — INSULIN PEN NEEDLE 31G X 6 MM MISC: Status: DC

## 2014-04-08 MED ORDER — GABAPENTIN 600 MG PO TABS
600.0000 mg | ORAL_TABLET | Freq: Three times a day (TID) | ORAL | Status: DC
Start: 2014-04-08 — End: 2014-04-23

## 2014-04-08 MED ORDER — VALACYCLOVIR HCL 500 MG PO TABS
500.0000 mg | ORAL_TABLET | Freq: Two times a day (BID) | ORAL | Status: DC
Start: 2014-04-08 — End: 2014-05-01

## 2014-04-08 MED ORDER — INSULIN GLARGINE 100 UNIT/ML SOLOSTAR PEN: PEN_INJECTOR | SUBCUTANEOUS | Status: DC

## 2014-04-08 NOTE — Progress Notes (Signed)
Subjective:    Patient ID: Pedro [name], male    DOB: [birth year], 19 y.o.   MRN: [id number]  HPI Patient is here for hospital follow up.  He has been diagnosed with DM.  He was put on insulin.  He has been diabetic since 19 years old and type 1 diabetes.  He had to go to jail and he was not given insulin and he had DKA.  He was seen last week and tx'd with lantus and novolog.  He needs refills.  He is having neuropathy.  He is taking neurontin at hs and this is not helping.  He wants refills on ADD meds.  He has acne and wants referral to Dermatology.  He reports his fasting fsbs is around 100 in am.  He reports post prandial glucose readings 200's.  Review of Systems  Constitutional: Negative for fever.  HENT: Negative for ear pain.   Eyes: Negative for discharge.  Respiratory: Negative for cough.   Cardiovascular: Negative for chest pain.  Gastrointestinal: Negative for abdominal distention.  Endocrine: Negative for polyuria.  Genitourinary: Negative for difficulty urinating.  Musculoskeletal: Negative for gait problem and neck pain.  Skin: Negative for color change and rash.  Neurological: Negative for speech difficulty and headaches.  Psychiatric/Behavioral: Negative for agitation.       Objective:    BP 128/82 mmHg  Pulse 105  Temp(Src) 98 F (36.7 C) (Oral)  Ht _0  (1.803 m)  Wt 146 lb (66.225 kg)  BMI 20.37 kg/m2 Physical Exam  Constitutional: He is oriented to person, place, and time. He appears well-developed and well-nourished.  HENT:  Head: Normocephalic and atraumatic.  Mouth/Throat: Oropharynx is clear and moist.  Eyes: Pupils are equal, round, and reactive to light.  Neck: Normal range of motion. Neck supple.  Cardiovascular: Normal rate and regular rhythm.   No murmur heard. Pulmonary/Chest: Effort normal and breath sounds normal.  Abdominal: Soft. Bowel sounds are normal. There is no tenderness.  Neurological: He is alert and oriented to person,  place, and time.  Skin: Skin is warm and dry.  Back - open and closed commedone             Cystic and pustular acne  Psychiatric: He has a normal mood and affect.          Assessment & Plan:     ICD-9-CM ICD-10-CM   1. Type 1 diabetes mellitus with ketoacidosis and coma 250.33 E10.11 insulin aspart (NOVOLOG FLEXPEN) 100 UNIT/ML FlexPen     Insulin Glargine (LANTUS) 100 UNIT/ML Solostar Pen     Insulin Pen Needle 31G X 6 MM MISC     POCT glycosylated hemoglobin (Hb A1C)     TSH     CMP14+EGFR     POCT CBC     POCT urinalysis dipstick     POCT UA - Microscopic Only  2. Diabetic autonomic neuropathy associated with diabetes mellitus due to underlying condition 249.60 E08.43 gabapentin (NEURONTIN) 600 MG tablet   337.1  Vitamin B12  3. Genital herpes 054.10 A60.00 valACYclovir (VALTREX) 500 MG tablet  4. Acne vulgaris 706.1 L70.0 Ambulatory referral to Dermatology   Follow up with Shelah Lewandowsky FNP for ADD meds.   See Sharyn Lull or Tammy for diabetes visit.  Take novolog 10 units with heaviest meal of the day and continue lantus 48 units sq daily.   Discussed that he needs to eat three times a day and needs HS snack.  No Follow-up on file.  Lysbeth Penner FNP

## 2014-04-08 NOTE — Addendum Note (Signed)
Addended by: Jamelle Haring on: 04/08/2014 02:51 PM   Modules accepted: Orders, Medications

## 2014-04-08 NOTE — Addendum Note (Signed)
Addended by: Selmer Dominion on: 04/08/2014 11:24 AM   Modules accepted: Orders

## 2014-04-09 LAB — CMP14+EGFR
ALT: 7 IU/L (ref 0–44)
AST: 9 IU/L (ref 0–40)
Albumin/Globulin Ratio: 2 (ref 1.1–2.5)
Albumin: 4.6 g/dL (ref 3.5–5.5)
Alkaline Phosphatase: 94 IU/L (ref 39–117)
BUN/Creatinine Ratio: 16 (ref 8–19)
BUN: 12 mg/dL (ref 6–20)
CO2: 23 mmol/L (ref 18–29)
Calcium: 9.7 mg/dL (ref 8.7–10.2)
Chloride: 100 mmol/L (ref 97–108)
Creatinine, Ser: 0.74 mg/dL — ABNORMAL LOW (ref 0.76–1.27)
GFR calc Af Amer: 154 mL/min/{1.73_m2} (ref >59)
GFR calc non Af Amer: 133 mL/min/{1.73_m2} (ref >59)
Globulin, Total: 2.3 g/dL (ref 1.5–4.5)
Glucose: 255 mg/dL — ABNORMAL HIGH (ref 65–99)
Potassium: 4.7 mmol/L (ref 3.5–5.2)
Sodium: 139 mmol/L (ref 134–144)
Total Bilirubin: 0.5 mg/dL (ref 0.0–1.2)
Total Protein: 6.9 g/dL (ref 6.0–8.5)

## 2014-04-09 LAB — TSH: TSH: 0.527 u[IU]/mL (ref 0.450–4.500)

## 2014-04-09 LAB — VITAMIN B12: Vitamin B-12: 500 pg/mL (ref 211–946)

## 2014-04-16 ENCOUNTER — Encounter: Payer: Self-pay | Admitting: Family Medicine

## 2014-04-16 ENCOUNTER — Ambulatory Visit (INDEPENDENT_AMBULATORY_CARE_PROVIDER_SITE_OTHER): Payer: Medicaid Other | Admitting: Family Medicine

## 2014-04-16 VITALS — BP 128/84 | HR 92 | Temp 97.8°F | Ht 71.0 in | Wt 144.0 lb

## 2014-04-16 DIAGNOSIS — R369 Urethral discharge, unspecified: Secondary | ICD-10-CM

## 2014-04-16 DIAGNOSIS — A64 Unspecified sexually transmitted disease: Secondary | ICD-10-CM

## 2014-04-16 DIAGNOSIS — B029 Zoster without complications: Secondary | ICD-10-CM

## 2014-04-16 LAB — POCT URINALYSIS DIPSTICK
Bilirubin, UA: NEGATIVE
Blood, UA: NEGATIVE
Glucose, UA: NEGATIVE
Leukocytes, UA: NEGATIVE
Nitrite, UA: NEGATIVE
Protein, UA: NEGATIVE
Spec Grav, UA: 1.015
Urobilinogen, UA: 1
pH, UA: 6.5

## 2014-04-16 LAB — POCT UA - MICROSCOPIC ONLY
Casts, Ur, LPF, POC: NEGATIVE
Crystals, Ur, HPF, POC: NEGATIVE
RBC, urine, microscopic: NEGATIVE
Yeast, UA: NEGATIVE

## 2014-04-16 MED ORDER — VALACYCLOVIR HCL 1 G PO TABS: 1000.0000 mg | ORAL_TABLET | Freq: Two times a day (BID) | ORAL | Status: DC

## 2014-04-16 MED ORDER — HYDROCODONE-ACETAMINOPHEN 5-325 MG PO TABS: 1.0000 | ORAL_TABLET | Freq: Four times a day (QID) | ORAL | Status: DC | PRN

## 2014-04-16 MED ORDER — CEFTRIAXONE SODIUM 1 G IJ SOLR
1.0000 g | Freq: Once | INTRAMUSCULAR | Status: AC
  Administered 2014-04-16: 1 g via INTRAMUSCULAR

## 2014-04-16 MED ORDER — AZITHROMYCIN 500 MG PO TABS: ORAL_TABLET | ORAL | Status: DC

## 2014-04-16 NOTE — Progress Notes (Signed)
   Subjective:    Patient ID: Pedro [name], male    DOB: 1995/10/28, 19 y.o.   MRN: [id number]  HPI Patient c/o dysuria and urethral discharge.  He has outbreak of genital herpes and he is having a lot of pain.  Review of Systems  Constitutional: Negative for fever.  HENT: Negative for ear pain.   Eyes: Negative for discharge.  Respiratory: Negative for cough.   Cardiovascular: Negative for chest pain.  Gastrointestinal: Negative for abdominal distention.  Endocrine: Negative for polyuria.  Genitourinary: Negative for difficulty urinating.  Musculoskeletal: Negative for gait problem and neck pain.  Skin: Negative for color change and rash.  Neurological: Negative for speech difficulty and headaches.  Psychiatric/Behavioral: Negative for agitation.       Objective:    BP 128/84 mmHg  Pulse 92  Temp(Src) 97.8 F (36.6 C) (Oral)  Ht 5\' 11"  (1.803 m)  Wt 144 lb (65.318 kg)  BMI 20.09 kg/m2 Physical Exam  Constitutional: He is oriented to person, place, and time. He appears well-developed and well-nourished.  HENT:  Head: Normocephalic and atraumatic.  Mouth/Throat: Oropharynx is clear and moist.  Eyes: Pupils are equal, round, and reactive to light.  Neck: Normal range of motion. Neck supple.  Cardiovascular: Normal rate and regular rhythm.   No murmur heard. Pulmonary/Chest: Effort normal and breath sounds normal.  Abdominal: Soft. Bowel sounds are normal. There is no tenderness.  Neurological: He is alert and oriented to person, place, and time.  Skin: Skin is warm and dry.  Psychiatric: He has a normal mood and affect.          Assessment & Plan:     ICD-9-CM ICD-10-CM   1. Herpes zoster 053.9 B02.9 GC/Chlamydia Probe Amp     POCT UA - Microscopic Only     POCT urinalysis dipstick     valACYclovir (VALTREX) 1000 MG tablet     HYDROcodone-acetaminophen (NORCO) 5-325 MG per tablet     HIV antibody (with reflex)  2. Urethral discharge in male 788.7 R36.9  GC/Chlamydia Probe Amp     POCT UA - Microscopic Only     POCT urinalysis dipstick     valACYclovir (VALTREX) 1000 MG tablet     HYDROcodone-acetaminophen (NORCO) 5-325 MG per tablet     Urine culture     HIV antibody (with reflex)  3. STD (male) 099.9 A64 cefTRIAXone (ROCEPHIN) injection 1 g     azithromycin (ZITHROMAX) 500 MG tablet     HIV antibody (with reflex)     No Follow-up on file.  Lysbeth Penner FNP

## 2014-04-18 LAB — HIV ANTIBODY (ROUTINE TESTING W REFLEX): HIV Screen 4th Generation wRfx: NONREACTIVE

## 2014-04-18 LAB — URINE CULTURE: Organism ID, Bacteria: NO GROWTH

## 2014-04-22 LAB — GC/CHLAMYDIA PROBE AMP
Chlamydia trachomatis, NAA: NEGATIVE
Neisseria gonorrhoeae by PCR: NEGATIVE

## 2014-04-23 ENCOUNTER — Other Ambulatory Visit: Payer: Self-pay | Admitting: *Deleted

## 2014-04-23 MED ORDER — GABAPENTIN 400 MG PO CAPS: ORAL_CAPSULE | ORAL | Status: DC

## 2014-04-29 ENCOUNTER — Encounter (HOSPITAL_COMMUNITY): Payer: Self-pay | Admitting: Emergency Medicine

## 2014-04-29 ENCOUNTER — Emergency Department (HOSPITAL_COMMUNITY)
Admission: EM | Admit: 2014-04-29 | Discharge: 2014-04-29 | Disposition: A | Discharge status: Home / Self Care | Payer: Medicaid Other | Source: Home / Self Care | Attending: Emergency Medicine | Admitting: Emergency Medicine

## 2014-04-29 DIAGNOSIS — R739 Hyperglycemia, unspecified: Secondary | ICD-10-CM

## 2014-04-29 LAB — CBC WITH DIFFERENTIAL/PLATELET
BASOS PCT: 0 % (ref 0–1)
Basophils Absolute: 0 10*3/uL (ref 0.0–0.1)
EOS PCT: 1 % (ref 0–5)
Eosinophils Absolute: 0.1 10*3/uL (ref 0.0–0.7)
HCT: 44.8 % (ref 39.0–52.0)
Hemoglobin: 14.9 g/dL (ref 13.0–17.0)
Lymphocytes Relative: 12 % (ref 12–46)
Lymphs Abs: 2.3 10*3/uL (ref 0.7–4.0)
MCH: 31.6 pg (ref 26.0–34.0)
MCHC: 33.3 g/dL (ref 30.0–36.0)
MCV: 94.9 fL (ref 78.0–100.0)
MONO ABS: 0.8 10*3/uL (ref 0.1–1.0)
MONOS PCT: 4 % (ref 3–12)
NEUTROS ABS: 15.4 10*3/uL — AB (ref 1.7–7.7)
Neutrophils Relative %: 83 % — ABNORMAL HIGH (ref 43–77)
PLATELETS: 285 10*3/uL (ref 150–400)
RBC: 4.72 MIL/uL (ref 4.22–5.81)
RDW: 12.7 % (ref 11.5–15.5)
WBC: 18.7 10*3/uL — ABNORMAL HIGH (ref 4.0–10.5)

## 2014-04-29 LAB — COMPREHENSIVE METABOLIC PANEL
ALBUMIN: 4.2 g/dL (ref 3.5–5.2)
ALT: 10 U/L (ref 0–53)
AST: 12 U/L (ref 0–37)
Alkaline Phosphatase: 73 U/L (ref 39–117)
Anion gap: 7 (ref 5–15)
BUN: 11 mg/dL (ref 6–23)
CALCIUM: 8.7 mg/dL (ref 8.4–10.5)
CHLORIDE: 100 mmol/L (ref 96–112)
CO2: 26 mmol/L (ref 19–32)
CREATININE: 0.75 mg/dL (ref 0.50–1.35)
GFR calc Af Amer: >90 mL/min (ref >90)
GFR calc non Af Amer: >90 mL/min (ref >90)
GLUCOSE: 697 mg/dL — AB (ref 70–99)
POTASSIUM: 4 mmol/L (ref 3.5–5.1)
SODIUM: 133 mmol/L — AB (ref 135–145)
TOTAL PROTEIN: 6.8 g/dL (ref 6.0–8.3)
Total Bilirubin: 1.1 mg/dL (ref 0.3–1.2)

## 2014-04-29 LAB — CBG MONITORING, ED
GLUCOSE-CAPILLARY: 210 mg/dL — AB (ref 70–99)
Glucose-Capillary: 498 mg/dL — ABNORMAL HIGH (ref 70–99)
Glucose-Capillary: 379 mg/dL — ABNORMAL HIGH (ref 70–99)
Glucose-Capillary: 322 mg/dL — ABNORMAL HIGH (ref 70–99)

## 2014-04-29 MED ORDER — SODIUM CHLORIDE 0.9 % IV SOLN
1000.0000 mL | Freq: Once | INTRAVENOUS | Status: AC
  Administered 2014-04-29: 1000 mL via INTRAVENOUS

## 2014-04-29 MED ORDER — SODIUM CHLORIDE 0.9 % IV SOLN: 1000.0000 mL | Freq: Once | INTRAVENOUS | Status: AC

## 2014-04-29 MED ORDER — SODIUM CHLORIDE 0.9 % IV SOLN
INTRAVENOUS | Status: DC
  Administered 2014-04-29: 4.4 [IU]/h via INTRAVENOUS
  Administered 2014-04-29: 3 [IU]/h via INTRAVENOUS
  Filled 2014-04-29: qty 2.5

## 2014-04-29 MED ORDER — SODIUM CHLORIDE 0.9 % IV SOLN
1000.0000 mL | INTRAVENOUS | Status: DC
  Administered 2014-04-29: 1000 mL via INTRAVENOUS

## 2014-04-29 NOTE — Discharge Instructions (Signed)
Take your medication as you should. Monitor your blood sugar closely. Recheck as needed.    Hyperglycemia Hyperglycemia occurs when the glucose (sugar) in your blood is too high. Hyperglycemia can happen for many reasons, but it most often happens to people who do not know they have diabetes or are not managing their diabetes properly.  CAUSES  Whether you have diabetes or not, there are other causes of hyperglycemia. Hyperglycemia can occur when you have diabetes, but it can also occur in other situations that you might not be as aware of, such as: Diabetes  If you have diabetes and are having problems controlling your blood glucose, hyperglycemia could occur because of some of the following reasons:  Not following your meal plan.  Not taking your diabetes medications or not taking it properly.  Exercising less or doing less activity than you normally do.  Being sick. Pre-diabetes  This cannot be ignored. Before people develop Type 2 diabetes, they almost always have "pre-diabetes." This is when your blood glucose levels are higher than normal, but not yet high enough to be diagnosed as diabetes. Research has shown that some long-term damage to the body, especially the heart and circulatory system, may already be occurring during pre-diabetes. If you take action to manage your blood glucose when you have pre-diabetes, you may delay or prevent Type 2 diabetes from developing. Stress  If you have diabetes, you may be "diet" controlled or on oral medications or insulin to control your diabetes. However, you may find that your blood glucose is higher than usual in the hospital whether you have diabetes or not. This is often referred to as "stress hyperglycemia." Stress can elevate your blood glucose. This happens because of hormones put out by the body during times of stress. If stress has been the cause of your high blood glucose, it can be followed regularly by your caregiver. That way he/she  can make sure your hyperglycemia does not continue to get worse or progress to diabetes. Steroids  Steroids are medications that act on the infection fighting system (immune system) to block inflammation or infection. One side effect can be a rise in blood glucose. Most people can produce enough extra insulin to allow for this rise, but for those who cannot, steroids make blood glucose levels go even higher. It is not unusual for steroid treatments to "uncover" diabetes that is developing. It is not always possible to determine if the hyperglycemia will go away after the steroids are stopped. A special blood test called an A1c is sometimes done to determine if your blood glucose was elevated before the steroids were started. SYMPTOMS  Thirsty.  Frequent urination.  Dry mouth.  Blurred vision.  Tired or fatigue.  Weakness.  Sleepy.  Tingling in feet or leg. DIAGNOSIS  Diagnosis is made by monitoring blood glucose in one or all of the following ways:  A1c test. This is a chemical found in your blood.  Fingerstick blood glucose monitoring.  Laboratory results. TREATMENT  First, knowing the cause of the hyperglycemia is important before the hyperglycemia can be treated. Treatment may include, but is not be limited to:  Education.  Change or adjustment in medications.  Change or adjustment in meal plan.  Treatment for an illness, infection, etc.  More frequent blood glucose monitoring.  Change in exercise plan.  Decreasing or stopping steroids.  Lifestyle changes. HOME CARE INSTRUCTIONS   Test your blood glucose as directed.  Exercise regularly. Your caregiver will give you instructions  about exercise. Pre-diabetes or diabetes which comes on with stress is helped by exercising.  Eat wholesome, balanced meals. Eat often and at regular, fixed times. Your caregiver or nutritionist will give you a meal plan to guide your sugar intake.  Being at an ideal weight is  important. If needed, losing as little as 10 to 15 pounds may help improve blood glucose levels. SEEK MEDICAL CARE IF:   You have questions about medicine, activity, or diet.  You continue to have symptoms (problems such as increased thirst, urination, or weight gain). SEEK IMMEDIATE MEDICAL CARE IF:   You are vomiting or have diarrhea.  Your breath smells fruity.  You are breathing faster or slower.  You are very sleepy or incoherent.  You have numbness, tingling, or pain in your feet or hands.  You have chest pain.  Your symptoms get worse even though you have been following your caregiver's orders.  If you have any other questions or concerns. Document Released: 08/17/2000 Document Revised: 05/16/2011 Document Reviewed: 06/20/2011 [facility] Patient Information 2015 [place], [place]. This information is not intended to replace advice given to you by your health care provider. Make sure you discuss any questions you have with your health care provider.

## 2014-04-29 NOTE — ED Notes (Signed)
CBG 210 

## 2014-04-29 NOTE — ED Notes (Signed)
CRITICAL VALUE ALERT  Critical value received:  Glucose 497  Date of notification:  04/29/2014  Time of notification:  0413  Critical value read back:Yes.    Nurse who received alert:  Fabio Neighbors RN  MD notified (1st page):  Dr. Tomi Bamberger [phone]  Time of first page:  [phone]

## 2014-04-29 NOTE — ED Notes (Addendum)
cbg 598 in triage. Pt states he did not take his lantus this morning & has taken 40 units of novolog during the day.

## 2014-04-29 NOTE — ED Provider Notes (Signed)
CSN: [id number]     Arrival date & time 04/29/14  0209 History   First MD Initiated Contact with Patient 04/29/14 0257     Chief Complaint  Patient presents with  . Hyperglycemia   Level V caveat due to patient being sleepy  (Consider location/radiation/quality/duration/timing/severity/associated sxs/prior Treatment) HPI  Patient has had diabetes since age 19. He states he took his insulin as usual on February 21. However yesterday February 22 he states he was late for his anger management class and he did not take his morning insulin. History is very difficult as patient sleeping and he resents having to answer any questions. He mumbles and gets upset when asking to repeat what he said. He states he did take his insulin 4-5 times a day like usual. When I ask him if he took extra insulin because his CBGs were high and he states "a little bit" he will not be more definitive than that. He states he's had vomiting twice. He denies chest pain, shortness of breath, diarrhea.   [facility] in [place]  Past Medical History  Diagnosis Date  . ADHD (attention deficit hyperactivity disorder)   . DM type 1 (diabetes mellitus, type 1)     diagnosed age 73  . Herpes    Past Surgical History  Procedure Laterality Date  . None     Family History  Problem Relation Age of Onset  . Diabetes Mellitus I Paternal Grandmother   . Diabetes Mellitus I Paternal Aunt    History  Substance Use Topics  . Smoking status: Current Every Day Smoker -- 0.50 packs/day for 2 years    Types: Cigarettes  . Smokeless tobacco: Current User  . Alcohol Use: Yes     Comment: occassional- denies  employed in roofing  Review of Systems  All other systems reviewed and are negative.     Allergies  Sulfa antibiotics  Home Medications   Prior to Admission medications   Medication Sig Start Date End Date Taking? Authorizing Provider  gabapentin (NEURONTIN) 400 MG capsule Take 1 cap AM , 1 cap at  [facility]  and 2 cap at bedtime 04/23/14  Yes Chipper Herb, MD  insulin aspart (NOVOLOG FLEXPEN) 100 UNIT/ML FlexPen Inject 5-10 Units into the skin 3 (three) times daily with meals. 04/08/14  Yes Lysbeth Penner, FNP  Insulin Glargine (LANTUS) 100 UNIT/ML Solostar Pen Inject 48 units + 1 units as needed to prime pen (total of 49 units) once daily. 04/08/14  Yes Lysbeth Penner, FNP  Insulin Pen Needle 31G X 6 MM MISC To check BG 3x daily. Dx E10.10 04/08/14  Yes Lysbeth Penner, FNP  valACYclovir (VALTREX) 1000 MG tablet Take 1 tablet (1,000 mg total) by mouth 2 (two) times daily. 04/16/14  Yes Lysbeth Penner, FNP  valACYclovir (VALTREX) 500 MG tablet Take 1 tablet (500 mg total) by mouth 2 (two) times daily. 04/08/14  Yes Lysbeth Penner, FNP  azithromycin (ZITHROMAX) 500 MG tablet Take 2 tablets po once 04/16/14   Lysbeth Penner, FNP  HYDROcodone-acetaminophen (NORCO) 5-325 MG per tablet Take 1 tablet by mouth every 6 (six) hours as needed for moderate pain. 04/16/14   Lysbeth Penner, FNP   BP 139/85 mmHg  Pulse 104  Temp(Src) 98.9 F (37.2 C) (Oral)  Resp 20  Ht 5\' 11"  (1.803 m)  Wt 140 lb (63.504 kg)  BMI 19.53 kg/m2  SpO2 100%  Vital signs normal except for tachycardia  Physical Exam  Constitutional: He is oriented to person, place, and time. He appears well-developed and well-nourished.  Non-toxic appearance. He does not appear ill. No distress.  Patient sleeping, mumbles when he answers questions, patient irritated and does not want to answer any questions.  HENT:  Head: Normocephalic and atraumatic.  Right Ear: External ear normal.  Left Ear: External ear normal.  Nose: Nose normal. No mucosal edema or rhinorrhea.  Mouth/Throat: Oropharynx is clear and moist and mucous membranes are normal. No dental abscesses or uvula swelling.  Eyes: Conjunctivae and EOM are normal. Pupils are equal, round, and reactive to light.  Neck: Normal range of motion and full passive range of motion without  pain. Neck supple.  Cardiovascular: Normal rate, regular rhythm and normal heart sounds.  Exam reveals no gallop and no friction rub.   No murmur heard. Pulmonary/Chest: Effort normal and breath sounds normal. No respiratory distress. He has no wheezes. He has no rhonchi. He has no rales. He exhibits no tenderness and no crepitus.  Abdominal: Soft. Normal appearance and bowel sounds are normal. He exhibits no distension. There is no tenderness. There is no rebound and no guarding.  Musculoskeletal: Normal range of motion. He exhibits no edema or tenderness.  Moves all extremities well.   Neurological: He is alert and oriented to person, place, and time. He has normal strength. No cranial nerve deficit.  Skin: Skin is warm, dry and intact. No rash noted. No erythema. No pallor.  Psychiatric: His affect is labile. His speech is slurred.  Nursing note and vitals reviewed.   ED Course  Procedures (including critical care time)  Medications  0.9 %  sodium chloride infusion (0 mLs Intravenous Stopped 04/29/14 0501)    Followed by  0.9 %  sodium chloride infusion (0 mLs Intravenous Duplicate 3/87/56 4332)    Followed by  0.9 %  sodium chloride infusion (0 mLs Intravenous Stopped 04/29/14 0810)  insulin regular (NOVOLIN R,HUMULIN R) 250 Units in sodium chloride 0.9 % 250 mL (1 Units/mL) infusion ( Intravenous Stopped 04/29/14 0810)  0.9 %  sodium chloride infusion (0 mLs Intravenous Stopped 04/29/14 0347)   Pt started on IV fluids and IV insulin drip. His CBG's improved and he was discharged.     Labs Review Results for orders placed or performed during the hospital encounter of 04/29/14  CBC with Differential  Result Value Ref Range   WBC 18.7 (H) 4.0 - 10.5 K/uL   RBC 4.72 4.22 - 5.81 MIL/uL   Hemoglobin 14.9 13.0 - 17.0 g/dL   HCT 44.8 39.0 - 52.0 %   MCV 94.9 78.0 - 100.0 fL   MCH 31.6 26.0 - 34.0 pg   MCHC 33.3 30.0 - 36.0 g/dL   RDW 12.7 11.5 - 15.5 %   Platelets 285 150 - 400 K/uL    Neutrophils Relative % 83 (H) 43 - 77 %   Neutro Abs 15.4 (H) 1.7 - 7.7 K/uL   Lymphocytes Relative 12 12 - 46 %   Lymphs Abs 2.3 0.7 - 4.0 K/uL   Monocytes Relative 4 3 - 12 %   Monocytes Absolute 0.8 0.1 - 1.0 K/uL   Eosinophils Relative 1 0 - 5 %   Eosinophils Absolute 0.1 0.0 - 0.7 K/uL   Basophils Relative 0 0 - 1 %   Basophils Absolute 0.0 0.0 - 0.1 K/uL  Comprehensive metabolic panel  Result Value Ref Range   Sodium 133 (L) 135 - 145 mmol/L   Potassium 4.0 3.5 -  5.1 mmol/L   Chloride 100 96 - 112 mmol/L   CO2 26 19 - 32 mmol/L   Glucose, Bld 697 (HH) 70 - 99 mg/dL   BUN 11 6 - 23 mg/dL   Creatinine, Ser 0.75 0.50 - 1.35 mg/dL   Calcium 8.7 8.4 - 10.5 mg/dL   Total Protein 6.8 6.0 - 8.3 g/dL   Albumin 4.2 3.5 - 5.2 g/dL   AST 12 0 - 37 U/L   ALT 10 0 - 53 U/L   Alkaline Phosphatase 73 39 - 117 U/L   Total Bilirubin 1.1 0.3 - 1.2 mg/dL   GFR calc non Af Amer >90 >90 mL/min   GFR calc Af Amer >90 >90 mL/min   Anion gap 7 5 - 15  CBG monitoring, ED  Result Value Ref Range   Glucose-Capillary 498 (H) 70 - 99 mg/dL   Comment 1 Notify RN    Comment 2 Document in Chart   CBG monitoring, ED  Result Value Ref Range   Glucose-Capillary 379 (H) 70 - 99 mg/dL  CBG monitoring, ED  Result Value Ref Range   Glucose-Capillary 322 (H) 70 - 99 mg/dL   Comment 1 Document in Chart   POC CBG, ED  Result Value Ref Range   Glucose-Capillary 210 (H) 70 - 99 mg/dL   Laboratory interpretation all normal except hyperglycemia without acidosis   Imaging Review No results found.   EKG Interpretation None      MDM   Final diagnoses:  Hyperglycemia   Plan discharge  Rolland Porter, MD, Alanson Aly, MD 04/29/14 [phone]

## 2014-04-29 NOTE — ED Notes (Signed)
Patient states he missed his Lantus injection this morning and c/o hyperglycemia all day.

## 2014-04-30 ENCOUNTER — Encounter (HOSPITAL_COMMUNITY): Payer: Self-pay | Admitting: Emergency Medicine

## 2014-04-30 ENCOUNTER — Inpatient Hospital Stay (HOSPITAL_COMMUNITY)
Admission: EM | Admit: 2014-04-30 | Discharge: 2014-05-01 | DRG: 639 | Disposition: A | Discharge status: Home / Self Care | Payer: Medicaid Other | Attending: Internal Medicine | Admitting: Internal Medicine

## 2014-04-30 ENCOUNTER — Emergency Department (HOSPITAL_COMMUNITY): Payer: Medicaid Other

## 2014-04-30 DIAGNOSIS — Z79899 Other long term (current) drug therapy: Secondary | ICD-10-CM

## 2014-04-30 DIAGNOSIS — R112 Nausea with vomiting, unspecified: Secondary | ICD-10-CM | POA: Diagnosis present

## 2014-04-30 DIAGNOSIS — Z9114 Patient's other noncompliance with medication regimen: Secondary | ICD-10-CM | POA: Diagnosis present

## 2014-04-30 DIAGNOSIS — E1010 Type 1 diabetes mellitus with ketoacidosis without coma: Secondary | ICD-10-CM | POA: Diagnosis present

## 2014-04-30 DIAGNOSIS — F909 Attention-deficit hyperactivity disorder, unspecified type: Secondary | ICD-10-CM | POA: Diagnosis present

## 2014-04-30 DIAGNOSIS — D72829 Elevated white blood cell count, unspecified: Secondary | ICD-10-CM | POA: Diagnosis present

## 2014-04-30 DIAGNOSIS — Z794 Long term (current) use of insulin: Secondary | ICD-10-CM

## 2014-04-30 DIAGNOSIS — F17210 Nicotine dependence, cigarettes, uncomplicated: Secondary | ICD-10-CM | POA: Diagnosis present

## 2014-04-30 DIAGNOSIS — E1110 Type 2 diabetes mellitus with ketoacidosis without coma: Secondary | ICD-10-CM | POA: Diagnosis present

## 2014-04-30 DIAGNOSIS — E0810 Diabetes mellitus due to underlying condition with ketoacidosis without coma: Secondary | ICD-10-CM

## 2014-04-30 HISTORY — DX: Patient's other noncompliance with medication regimen: Z91.14

## 2014-04-30 HISTORY — DX: Patient's other noncompliance with medication regimen for other reason: Z91.148

## 2014-04-30 LAB — CBC WITH DIFFERENTIAL/PLATELET
Basophils Absolute: 0.1 10*3/uL (ref 0.0–0.1)
Basophils Relative: 0 % (ref 0–1)
EOS PCT: 0 % (ref 0–5)
Eosinophils Absolute: 0.1 10*3/uL (ref 0.0–0.7)
HEMATOCRIT: 49.9 % (ref 39.0–52.0)
HEMOGLOBIN: 17.1 g/dL — AB (ref 13.0–17.0)
LYMPHS PCT: 17 % (ref 12–46)
Lymphs Abs: 2.1 10*3/uL (ref 0.7–4.0)
MCH: 31.8 pg (ref 26.0–34.0)
MCHC: 34.3 g/dL (ref 30.0–36.0)
MCV: 92.8 fL (ref 78.0–100.0)
MONO ABS: 0.4 10*3/uL (ref 0.1–1.0)
MONOS PCT: 3 % (ref 3–12)
Neutro Abs: 10.1 10*3/uL — ABNORMAL HIGH (ref 1.7–7.7)
Neutrophils Relative %: 80 % — ABNORMAL HIGH (ref 43–77)
Platelets: 327 10*3/uL (ref 150–400)
RBC: 5.38 MIL/uL (ref 4.22–5.81)
RDW: 12.7 % (ref 11.5–15.5)
WBC: 12.8 10*3/uL — ABNORMAL HIGH (ref 4.0–10.5)

## 2014-04-30 LAB — BLOOD GAS, ARTERIAL
Acid-base deficit: 11.7 mmol/L — ABNORMAL HIGH (ref 0.0–2.0)
Bicarbonate: 13 mEq/L — ABNORMAL LOW (ref 20.0–24.0)
Drawn by: 234301
FIO2: 21 %
O2 Saturation: 96.9 %
PCO2 ART: 25 mmHg — AB (ref 35.0–45.0)
PO2 ART: 93.4 mmHg (ref 80.0–100.0)
TCO2: 11.3 mmol/L (ref 0–100)
pH, Arterial: 7.336 — ABNORMAL LOW (ref 7.350–7.450)

## 2014-04-30 LAB — BASIC METABOLIC PANEL
ANION GAP: 4 — AB (ref 5–15)
ANION GAP: 1 — AB (ref 5–15)
BUN: 18 mg/dL (ref 6–23)
BUN: 14 mg/dL (ref 6–23)
CO2: 18 mmol/L — AB (ref 19–32)
CO2: 24 mmol/L (ref 19–32)
Calcium: 7.8 mg/dL — ABNORMAL LOW (ref 8.4–10.5)
Calcium: 7.8 mg/dL — ABNORMAL LOW (ref 8.4–10.5)
Chloride: 112 mmol/L (ref 96–112)
Chloride: 110 mmol/L (ref 96–112)
Creatinine, Ser: 0.73 mg/dL (ref 0.50–1.35)
Creatinine, Ser: 0.78 mg/dL (ref 0.50–1.35)
GFR calc Af Amer: >90 mL/min (ref >90)
GFR calc non Af Amer: >90 mL/min (ref >90)
GLUCOSE: 170 mg/dL — AB (ref 70–99)
Glucose, Bld: 119 mg/dL — ABNORMAL HIGH (ref 70–99)
POTASSIUM: 3.3 mmol/L — AB (ref 3.5–5.1)
Potassium: 4 mmol/L (ref 3.5–5.1)
SODIUM: 135 mmol/L (ref 135–145)
Sodium: 134 mmol/L — ABNORMAL LOW (ref 135–145)

## 2014-04-30 LAB — COMPREHENSIVE METABOLIC PANEL
ALT: 12 U/L (ref 0–53)
AST: 12 U/L (ref 0–37)
Albumin: 4.5 g/dL (ref 3.5–5.2)
Alkaline Phosphatase: 78 U/L (ref 39–117)
Anion gap: 14 (ref 5–15)
BILIRUBIN TOTAL: 2.7 mg/dL — AB (ref 0.3–1.2)
BUN: 20 mg/dL (ref 6–23)
CALCIUM: 8.9 mg/dL (ref 8.4–10.5)
CHLORIDE: 103 mmol/L (ref 96–112)
CO2: 14 mmol/L — AB (ref 19–32)
CREATININE: 0.97 mg/dL (ref 0.50–1.35)
GFR calc non Af Amer: >90 mL/min (ref >90)
Glucose, Bld: 381 mg/dL — ABNORMAL HIGH (ref 70–99)
Potassium: 4.2 mmol/L (ref 3.5–5.1)
Sodium: 131 mmol/L — ABNORMAL LOW (ref 135–145)
Total Protein: 7.4 g/dL (ref 6.0–8.3)

## 2014-04-30 LAB — GLUCOSE, CAPILLARY
GLUCOSE-CAPILLARY: 207 mg/dL — AB (ref 70–99)
GLUCOSE-CAPILLARY: 166 mg/dL — AB (ref 70–99)
Glucose-Capillary: 156 mg/dL — ABNORMAL HIGH (ref 70–99)
Glucose-Capillary: 168 mg/dL — ABNORMAL HIGH (ref 70–99)
Glucose-Capillary: 158 mg/dL — ABNORMAL HIGH (ref 70–99)

## 2014-04-30 LAB — CBG MONITORING, ED
GLUCOSE-CAPILLARY: 390 mg/dL — AB (ref 70–99)
Glucose-Capillary: 598 mg/dL (ref 70–99)

## 2014-04-30 LAB — BETA-HYDROXYBUTYRIC ACID: Beta-Hydroxybutyric Acid: 6.04 mmol/L — ABNORMAL HIGH (ref 0.05–0.27)

## 2014-04-30 LAB — MRSA PCR SCREENING: MRSA by PCR: NEGATIVE

## 2014-04-30 MED ORDER — HEPARIN SODIUM (PORCINE) 5000 UNIT/ML IJ SOLN
5000.0000 [IU] | Freq: Three times a day (TID) | INTRAMUSCULAR | Status: DC
  Filled 2014-04-30 (×3): qty 1

## 2014-04-30 MED ORDER — SODIUM CHLORIDE 0.9 % IV SOLN: INTRAVENOUS | Status: DC

## 2014-04-30 MED ORDER — DEXTROSE-NACL 5-0.45 % IV SOLN
INTRAVENOUS | Status: DC
  Administered 2014-04-30: 17:00:00 via INTRAVENOUS

## 2014-04-30 MED ORDER — SODIUM CHLORIDE 0.9 % IV SOLN
INTRAVENOUS | Status: AC
  Administered 2014-04-30: 16:00:00 via INTRAVENOUS

## 2014-04-30 MED ORDER — DEXTROSE-NACL 5-0.45 % IV SOLN: INTRAVENOUS | Status: DC

## 2014-04-30 MED ORDER — ONDANSETRON HCL 4 MG/2ML IJ SOLN: 4.0000 mg | Freq: Four times a day (QID) | INTRAMUSCULAR | Status: DC | PRN

## 2014-04-30 MED ORDER — SODIUM CHLORIDE 0.9 % IV SOLN
INTRAVENOUS | Status: DC
  Administered 2014-04-30: 16:00:00 via INTRAVENOUS
  Filled 2014-04-30: qty 2.5

## 2014-04-30 MED ORDER — MORPHINE SULFATE 2 MG/ML IJ SOLN: 1.0000 mg | INTRAMUSCULAR | Status: DC | PRN

## 2014-04-30 MED ORDER — DEXTROSE 50 % IV SOLN: 25.0000 mL | INTRAVENOUS | Status: DC | PRN

## 2014-04-30 MED ORDER — POTASSIUM CHLORIDE 10 MEQ/100ML IV SOLN
10.0000 meq | INTRAVENOUS | Status: AC
  Administered 2014-04-30 (×2): 10 meq via INTRAVENOUS
  Filled 2014-04-30 (×2): qty 100

## 2014-04-30 MED ORDER — INSULIN REGULAR BOLUS VIA INFUSION
0.0000 [IU] | Freq: Three times a day (TID) | INTRAVENOUS | Status: DC
  Filled 2014-04-30: qty 10

## 2014-04-30 MED ORDER — SODIUM CHLORIDE 0.9 % IV SOLN
INTRAVENOUS | Status: AC
  Administered 2014-04-30: 17:00:00 via INTRAVENOUS

## 2014-04-30 MED ORDER — SODIUM CHLORIDE 0.9 % IV BOLUS (SEPSIS)
2000.0000 mL | Freq: Once | INTRAVENOUS | Status: AC
  Administered 2014-04-30: 2000 mL via INTRAVENOUS

## 2014-04-30 NOTE — ED Notes (Signed)
RT at bedside.

## 2014-04-30 NOTE — ED Notes (Signed)
CBG 390

## 2014-04-30 NOTE — ED Provider Notes (Signed)
CSN: [id number]     Arrival date & time 04/30/14  1158 History   First MD Initiated Contact with Patient 04/30/14 1214     Chief Complaint  Patient presents with  . Emesis  . Hyperglycemia      HPI Pt was seen at 1220. Per pt, c/o gradual onset and persistence of constant "high blood sugar" since last night. Pt was evaluated in the ED yesterday for same, given IVF and insulin, then discharged. Pt returned home and did not take any insulin. Pt states he did not take his insulin "because I get busy and forget." States since this morning he has had several episodes of N/V. Denies diarrhea, no abd pain, no black or blood in emesis, no CP/SOB, no fevers. The symptoms have been associated with no other complaints. The patient has a significant history of similar symptoms previously, recently being evaluated for this complaint and multiple prior evals for same.      Past Medical History  Diagnosis Date  . ADHD (attention deficit hyperactivity disorder)   . DM type 1 (diabetes mellitus, type 1)     diagnosed age 19  . Herpes   . Noncompliance with medications    Past Surgical History  Procedure Laterality Date  . None     Family History  Problem Relation Age of Onset  . Diabetes Mellitus I Paternal Grandmother   . Diabetes Mellitus I Paternal Aunt    History  Substance Use Topics  . Smoking status: Current Every Day Smoker -- 0.50 packs/day for 2 years    Types: Cigarettes  . Smokeless tobacco: Current User  . Alcohol Use: Yes     Comment: occassional- denies    Review of Systems ROS: Statement: All systems negative except as marked or noted in the HPI; Constitutional: Negative for fever and chills. ; ; Eyes: Negative for eye pain, redness and discharge. ; ; ENMT: Negative for ear pain, hoarseness, nasal congestion, sinus pressure and sore throat. ; ; Cardiovascular: Negative for chest pain, palpitations, diaphoresis, dyspnea and peripheral edema. ; ; Respiratory: Negative for  cough, wheezing and stridor. ; ; Gastrointestinal: +N/V. Negative for diarrhea, abdominal pain, blood in stool, hematemesis, jaundice and rectal bleeding. . ; ; Genitourinary: Negative for dysuria, flank pain and hematuria. ; ; Musculoskeletal: Negative for back pain and neck pain. Negative for swelling and trauma.; ; Skin: Negative for pruritus, rash, abrasions, blisters, bruising and skin lesion.; ; Neuro: Negative for headache, lightheadedness and neck stiffness. Negative for weakness, altered level of consciousness , altered mental status, extremity weakness, paresthesias, involuntary movement, seizure and syncope.      Allergies  Sulfa antibiotics  Home Medications   Prior to Admission medications   Medication Sig Start Date End Date Taking? Authorizing Provider  gabapentin (NEURONTIN) 400 MG capsule Take 1 cap AM , 1 cap at  [facility] and 2 cap at bedtime Patient taking differently: Take 400 mg by mouth 3 (three) times daily. Take 1 capsule in the morning, 1 capsule at  [facility] and 2 capsules at bedtime. 04/23/14  Yes Chipper Herb, MD  insulin aspart (NOVOLOG FLEXPEN) 100 UNIT/ML FlexPen Inject 5-10 Units into the skin 3 (three) times daily with meals. 04/08/14  Yes Lysbeth Penner, FNP  Insulin Glargine (LANTUS) 100 UNIT/ML Solostar Pen Inject 48 units + 1 units as needed to prime pen (total of 49 units) once daily. Patient taking differently: Inject 48 Units into the skin daily. Inject 48 units + 1 units as  needed to prime pen (total of 49 units) once daily. 04/08/14  Yes Lysbeth Penner, FNP  Insulin Pen Needle 31G X 6 MM MISC To check BG 3x daily. Dx E10.10 04/08/14  Yes Lysbeth Penner, FNP  valACYclovir (VALTREX) 1000 MG tablet Take 1 tablet (1,000 mg total) by mouth 2 (two) times daily. 04/16/14  Yes Lysbeth Penner, FNP  azithromycin (ZITHROMAX) 500 MG tablet Take 2 tablets po once Patient not taking: Reported on 04/30/2014 04/16/14   Lysbeth Penner, FNP  HYDROcodone-acetaminophen (NORCO)  5-325 MG per tablet Take 1 tablet by mouth every 6 (six) hours as needed for moderate pain. Patient not taking: Reported on 04/30/2014 04/16/14   Lysbeth Penner, FNP  valACYclovir (VALTREX) 500 MG tablet Take 1 tablet (500 mg total) by mouth 2 (two) times daily. Patient not taking: Reported on 04/30/2014 04/08/14   Lysbeth Penner, FNP   BP 114/70 mmHg  Pulse 75  Temp(Src) 97.9 F (36.6 C) (Oral)  Resp 17  Ht 5\' 11"  (1.803 m)  Wt 140 lb (63.504 kg)  BMI 19.53 kg/m2  SpO2 100% Physical Exam  1225: Physical examination:  Nursing notes reviewed; Vital signs and O2 SAT reviewed;  Constitutional: Well developed, Well nourished, In no acute distress; Head:  Normocephalic, atraumatic; Eyes: EOMI, PERRL, No scleral icterus; ENMT: Mouth and pharynx normal, Mucous membranes dry; Neck: Supple, Full range of motion, No lymphadenopathy; Cardiovascular: Regular rate and rhythm, No murmur, rub, or gallop; Respiratory: Breath sounds clear & equal bilaterally, No rales, rhonchi, wheezes.  Speaking full sentences with ease, Normal respiratory effort/excursion; Chest: Nontender, Movement normal; Abdomen: Soft, Nontender, Nondistended, Normal bowel sounds; Genitourinary: No CVA tenderness; Extremities: Pulses normal, No tenderness, No edema, No calf edema or asymmetry.; Neuro: AA&Ox3, Major CN grossly intact.  Speech clear. No gross focal motor or sensory deficits in extremities.; Skin: Color normal, Warm, Dry.   ED Course  Procedures     EKG Interpretation   Date/Time:  Wednesday April 30 2014 13:17:48 EST Ventricular Rate:  97 PR Interval:  144 QRS Duration: 108 QT Interval:  373 QTC Calculation: 474 R Axis:   104 Text Interpretation:  Sinus rhythm Consider right ventricular hypertrophy  Borderline Right axis deviation ST elev, probable normal early repol  pattern Baseline wander When compared with ECG of 03/19/2013 No significant  change was found Confirmed by [facility]  MD, Nunzio Cory [phone]) on  04/30/2014  1:27:24 PM      MDM  MDM Reviewed: previous chart, nursing note and vitals Reviewed previous: labs and ECG Interpretation: labs and ECG Total time providing critical care: 30-74 minutes. This excludes time spent performing separately reportable procedures and services. Consults: admitting MD      CRITICAL CARE Performed by: Alfonzo Feller Total critical care time: 35 Critical care time was exclusive of separately billable procedures and treating other patients. Critical care was necessary to treat or prevent imminent or life-threatening deterioration. Critical care was time spent personally by me on the following activities: development of treatment plan with patient and/or surrogate as well as nursing, discussions with consultants, evaluation of patient's response to treatment, examination of patient, obtaining history from patient or surrogate, ordering and performing treatments and interventions, ordering and review of laboratory studies, ordering and review of radiographic studies, pulse oximetry and re-evaluation of patient's condition.   Results for orders placed or performed during the hospital encounter of 04/30/14  CBC with Differential  Result Value Ref Range   WBC 12.8 (H) 4.0 -  10.5 K/uL   RBC 5.38 4.22 - 5.81 MIL/uL   Hemoglobin 17.1 (H) 13.0 - 17.0 g/dL   HCT 49.9 39.0 - 52.0 %   MCV 92.8 78.0 - 100.0 fL   MCH 31.8 26.0 - 34.0 pg   MCHC 34.3 30.0 - 36.0 g/dL   RDW 12.7 11.5 - 15.5 %   Platelets 327 150 - 400 K/uL   Neutrophils Relative % 80 (H) 43 - 77 %   Neutro Abs 10.1 (H) 1.7 - 7.7 K/uL   Lymphocytes Relative 17 12 - 46 %   Lymphs Abs 2.1 0.7 - 4.0 K/uL   Monocytes Relative 3 3 - 12 %   Monocytes Absolute 0.4 0.1 - 1.0 K/uL   Eosinophils Relative 0 0 - 5 %   Eosinophils Absolute 0.1 0.0 - 0.7 K/uL   Basophils Relative 0 0 - 1 %   Basophils Absolute 0.1 0.0 - 0.1 K/uL  Comprehensive metabolic panel  Result Value Ref Range   Sodium 131  (L) 135 - 145 mmol/L   Potassium 4.2 3.5 - 5.1 mmol/L   Chloride 103 96 - 112 mmol/L   CO2 14 (L) 19 - 32 mmol/L   Glucose, Bld 381 (H) 70 - 99 mg/dL   BUN 20 6 - 23 mg/dL   Creatinine, Ser 0.97 0.50 - 1.35 mg/dL   Calcium 8.9 8.4 - 10.5 mg/dL   Total Protein 7.4 6.0 - 8.3 g/dL   Albumin 4.5 3.5 - 5.2 g/dL   AST 12 0 - 37 U/L   ALT 12 0 - 53 U/L   Alkaline Phosphatase 78 39 - 117 U/L   Total Bilirubin 2.7 (H) 0.3 - 1.2 mg/dL   GFR calc non Af Amer >90 >90 mL/min   GFR calc Af Amer >90 >90 mL/min   Anion gap 14 5 - 15  Beta-hydroxybutyric acid  Result Value Ref Range   Beta-Hydroxybutyric Acid 6.04 (H) 0.05 - 0.27 mmol/L  Blood gas, arterial ([facility] & [facility] ONLY)  Result Value Ref Range   FIO2 21.00 %   Delivery systems ROOM AIR    pH, Arterial 7.336 (L) 7.350 - 7.450   pCO2 arterial 25.0 (L) 35.0 - 45.0 mmHg   pO2, Arterial 93.4 80.0 - 100.0 mmHg   Bicarbonate 13.0 (L) 20.0 - 24.0 mEq/L   TCO2 11.3 0 - 100 mmol/L   Acid-base deficit 11.7 (H) 0.0 - 2.0 mmol/L   O2 Saturation 96.9 %   Collection site LEFT RADIAL    Drawn by [id number]    Sample type ARTERIAL    Allens test (pass/fail) PASS PASS  POC CBG, ED  Result Value Ref Range   Glucose-Capillary 390 (H) 70 - 99 mg/dL    1350:   +DKA. IVF NS x2L bolus given, followed by IV insulin gtt. Dx and testing d/w pt.  Questions answered.  Verb understanding, agreeable to admit.  T/C to [facility]. Jerilee Hoh, case discussed, including:  HPI, pertinent PM/SHx, VS/PE, dx testing, ED course and treatment:  Agreeable to admit, requests to write temporary orders, obtain stepdown bed to team 1.     Francine Graven, DO 05/02/14 [phone]

## 2014-04-30 NOTE — ED Notes (Signed)
Pt states that he did not take his long acting insulin for the past 2 days and started vomiting this morning.

## 2014-04-30 NOTE — H&P (Signed)
[facility]          History and Physical    PCP:   Chevis Pretty, FNP   Chief Complaint:  Nausea/vomiting  HPI: 19 y/o well known to [place] for repeated DKA admissions. Was seen in the Ed yesterday for n/v and after improvement was sent home. He decided not to take his insulin yesterday and started having n/v this am that prompted a return visit to the ED. Found to be in DKA with a bicarb of 14 and an AG of 14. Has refused abdominal/chest x ray. We have been asked to admit him for further evaluation and management.  Allergies:   Allergies  Allergen Reactions  . Sulfa Antibiotics Hives    fever      Past Medical History  Diagnosis Date  . ADHD (attention deficit hyperactivity disorder)   . DM type 1 (diabetes mellitus, type 1)     diagnosed age 50  . Herpes   . Noncompliance with medications     Past Surgical History  Procedure Laterality Date  . None      Prior to Admission medications   Medication Sig Start Date End Date Taking? Authorizing Provider  gabapentin (NEURONTIN) 400 MG capsule Take 1 cap AM , 1 cap at  [facility] and 2 cap at bedtime Patient taking differently: Take 400 mg by mouth 3 (three) times daily. Take 1 capsule in the morning, 1 capsule at  [facility] and 2 capsules at bedtime. 04/23/14  Yes Chipper Herb, MD  insulin aspart (NOVOLOG FLEXPEN) 100 UNIT/ML FlexPen Inject 5-10 Units into the skin 3 (three) times daily with meals. 04/08/14  Yes Lysbeth Penner, FNP  Insulin Glargine (LANTUS) 100 UNIT/ML Solostar Pen Inject 48 units + 1 units as needed to prime pen (total of 49 units) once daily. Patient taking differently: Inject 48 Units into the skin daily. Inject 48 units + 1 units as needed to prime pen (total of 49 units) once daily. 04/08/14  Yes Lysbeth Penner, FNP  Insulin Pen Needle 31G X 6 MM MISC To check BG 3x daily. Dx E10.10 04/08/14  Yes Lysbeth Penner, FNP  valACYclovir (VALTREX) 1000 MG tablet Take 1 tablet (1,000 mg total)  by mouth 2 (two) times daily. 04/16/14  Yes Lysbeth Penner, FNP  azithromycin (ZITHROMAX) 500 MG tablet Take 2 tablets po once Patient not taking: Reported on 04/30/2014 04/16/14   Lysbeth Penner, FNP  HYDROcodone-acetaminophen (NORCO) 5-325 MG per tablet Take 1 tablet by mouth every 6 (six) hours as needed for moderate pain. Patient not taking: Reported on 04/30/2014 04/16/14   Lysbeth Penner, FNP  valACYclovir (VALTREX) 500 MG tablet Take 1 tablet (500 mg total) by mouth 2 (two) times daily. Patient not taking: Reported on 04/30/2014 04/08/14   Lysbeth Penner, FNP    Social History:  reports that he has been smoking Cigarettes.  He has a 1 pack-year smoking history. He uses smokeless tobacco. He reports that he drinks alcohol. He reports that he uses illicit drugs (Marijuana, Other-see comments, Benzodiazepines, Hydrocodone, and Oxycodone).  Family History  Problem Relation Age of Onset  . Diabetes Mellitus I Paternal Grandmother   . Diabetes Mellitus I Paternal Aunt     Review of Systems:  Constitutional: Denies fever, chills, diaphoresis, appetite change and fatigue.  HEENT: Denies photophobia, eye pain, redness, hearing loss, ear pain, congestion, sore throat, rhinorrhea, sneezing, mouth  sores, trouble swallowing, neck pain, neck stiffness and tinnitus.   Respiratory: Denies SOB, DOE, cough, chest tightness,  and wheezing.   Cardiovascular: Denies chest pain, palpitations and leg swelling.  Gastrointestinal: Denies diarrhea, constipation, blood in stool and abdominal distention.  Genitourinary: Denies dysuria, urgency, frequency, hematuria, flank pain and difficulty urinating.  Endocrine: Denies: hot or cold intolerance, sweats, changes in hair or nails, polyuria, polydipsia. Musculoskeletal: Denies myalgias, back pain, joint swelling, arthralgias and gait problem.  Skin: Denies pallor, rash and wound.  Neurological: Denies dizziness, seizures, syncope, weakness, light-headedness,  numbness and headaches.  Hematological: Denies adenopathy. Easy bruising, personal or family bleeding history  Psychiatric/Behavioral: Denies suicidal ideation, mood changes, confusion, nervousness, sleep disturbance and agitation   Physical Exam: Blood pressure 102/57, pulse 81, temperature 97.9 F (36.6 C), temperature source Oral, resp. rate 17, height $RemoveBe'5\' 11"'[id number]$  (1.803 m), weight 63.504 kg (140 lb), SpO2 100 %. Gen: AA ox3, belligerant HEENT: [place]/AT/PERRL Neck: supple/no JVD/no LAD/no bruits/no goiter CV: RRR, no M/R/G Lungs: CTA B Abd: S/NT/ND/+BS Ext: no C/C/E Neuro: intact/non-focal  Labs on Admission:  Results for orders placed or performed during the hospital encounter of 04/30/14 (from the past 48 hour(s))  POC CBG, ED     Status: Abnormal   Collection Time: 04/30/14 12:04 PM  Result Value Ref Range   Glucose-Capillary 390 (H) 70 - 99 mg/dL  CBC with Differential     Status: Abnormal   Collection Time: 04/30/14 12:15 PM  Result Value Ref Range   WBC 12.8 (H) 4.0 - 10.5 K/uL   RBC 5.38 4.22 - 5.81 MIL/uL   Hemoglobin 17.1 (H) 13.0 - 17.0 g/dL   HCT 49.9 39.0 - 52.0 %   MCV 92.8 78.0 - 100.0 fL   MCH 31.8 26.0 - 34.0 pg   MCHC 34.3 30.0 - 36.0 g/dL   RDW 12.7 11.5 - 15.5 %   Platelets 327 150 - 400 K/uL   Neutrophils Relative % 80 (H) 43 - 77 %   Neutro Abs 10.1 (H) 1.7 - 7.7 K/uL   Lymphocytes Relative 17 12 - 46 %   Lymphs Abs 2.1 0.7 - 4.0 K/uL   Monocytes Relative 3 3 - 12 %   Monocytes Absolute 0.4 0.1 - 1.0 K/uL   Eosinophils Relative 0 0 - 5 %   Eosinophils Absolute 0.1 0.0 - 0.7 K/uL   Basophils Relative 0 0 - 1 %   Basophils Absolute 0.1 0.0 - 0.1 K/uL  Comprehensive metabolic panel     Status: Abnormal   Collection Time: 04/30/14 12:15 PM  Result Value Ref Range   Sodium 131 (L) 135 - 145 mmol/L   Potassium 4.2 3.5 - 5.1 mmol/L   Chloride 103 96 - 112 mmol/L   CO2 14 (L) 19 - 32 mmol/L   Glucose, Bld 381 (H) 70 - 99 mg/dL   BUN 20 6 - 23 mg/dL    Creatinine, Ser 0.97 0.50 - 1.35 mg/dL   Calcium 8.9 8.4 - 10.5 mg/dL   Total Protein 7.4 6.0 - 8.3 g/dL   Albumin 4.5 3.5 - 5.2 g/dL   AST 12 0 - 37 U/L   ALT 12 0 - 53 U/L   Alkaline Phosphatase 78 39 - 117 U/L   Total Bilirubin 2.7 (H) 0.3 - 1.2 mg/dL   GFR calc non Af Amer >90 >90 mL/min   GFR calc Af Amer >90 >90 mL/min    Comment: (NOTE) The eGFR has been calculated using the CKD  EPI equation. This calculation has not been validated in all clinical situations. eGFR's persistently <90 mL/min signify possible Chronic Kidney Disease.    Anion gap 14 5 - 15  Beta-hydroxybutyric acid     Status: Abnormal   Collection Time: 04/30/14 12:15 PM  Result Value Ref Range   Beta-Hydroxybutyric Acid 6.04 (H) 0.05 - 0.27 mmol/L  Blood gas, arterial ([facility] & [facility] ONLY)     Status: Abnormal   Collection Time: 04/30/14 12:15 PM  Result Value Ref Range   FIO2 21.00 %   Delivery systems ROOM AIR    pH, Arterial 7.336 (L) 7.350 - 7.450   pCO2 arterial 25.0 (L) 35.0 - 45.0 mmHg   pO2, Arterial 93.4 80.0 - 100.0 mmHg   Bicarbonate 13.0 (L) 20.0 - 24.0 mEq/L   TCO2 11.3 0 - 100 mmol/L   Acid-base deficit 11.7 (H) 0.0 - 2.0 mmol/L   O2 Saturation 96.9 %   Collection site LEFT RADIAL    Drawn by [id number]    Sample type ARTERIAL    Allens test (pass/fail) PASS PASS    Radiological Exams on Admission: No results found.  Assessment/Plan Active Problems:   DKA (diabetic ketoacidoses)   DKA -Due to medication non-compliance. -Start DKA protocol. -IVF. -BMET q 4 hours to determine when ok to transition off drip.  DVT Prophylaxis -Heparin SQ  Code Status -Full Code   Time Spent on Admission: 75 minutes.  Lelon Frohlich [facility] Pager: [phone] 04/30/2014, 4:13 PM

## 2014-04-30 NOTE — ED Notes (Signed)
Pt states that he feels better after 1st liter bolus.

## 2014-04-30 NOTE — ED Notes (Signed)
Pt refusing chest x-ray, EDP aware.

## 2014-05-01 DIAGNOSIS — D72829 Elevated white blood cell count, unspecified: Secondary | ICD-10-CM

## 2014-05-01 LAB — GLUCOSE, CAPILLARY
GLUCOSE-CAPILLARY: 102 mg/dL — AB (ref 70–99)
GLUCOSE-CAPILLARY: 105 mg/dL — AB (ref 70–99)
GLUCOSE-CAPILLARY: 143 mg/dL — AB (ref 70–99)
GLUCOSE-CAPILLARY: 155 mg/dL — AB (ref 70–99)
GLUCOSE-CAPILLARY: 93 mg/dL (ref 70–99)
Glucose-Capillary: 111 mg/dL — ABNORMAL HIGH (ref 70–99)
Glucose-Capillary: 113 mg/dL — ABNORMAL HIGH (ref 70–99)
Glucose-Capillary: 41 mg/dL — CL (ref 70–99)
Glucose-Capillary: 86 mg/dL (ref 70–99)

## 2014-05-01 LAB — BASIC METABOLIC PANEL
ANION GAP: 8 (ref 5–15)
BUN: 13 mg/dL (ref 6–23)
CHLORIDE: 110 mmol/L (ref 96–112)
CO2: 22 mmol/L (ref 19–32)
Calcium: 8.4 mg/dL (ref 8.4–10.5)
Creatinine, Ser: 0.67 mg/dL (ref 0.50–1.35)
GFR calc non Af Amer: >90 mL/min (ref >90)
Glucose, Bld: 50 mg/dL — ABNORMAL LOW (ref 70–99)
Potassium: 3.3 mmol/L — ABNORMAL LOW (ref 3.5–5.1)
SODIUM: 140 mmol/L (ref 135–145)

## 2014-05-01 MED ORDER — INSULIN ASPART 100 UNIT/ML ~~LOC~~ SOLN: 0.0000 [IU] | Freq: Three times a day (TID) | SUBCUTANEOUS | Status: DC

## 2014-05-01 MED ORDER — INSULIN GLARGINE 100 UNIT/ML ~~LOC~~ SOLN
49.0000 [IU] | Freq: Every day | SUBCUTANEOUS | Status: DC
  Administered 2014-05-01: 49 [IU] via SUBCUTANEOUS
  Filled 2014-05-01 (×2): qty 0.49

## 2014-05-01 MED ORDER — INSULIN ASPART 100 UNIT/ML ~~LOC~~ SOLN: 0.0000 [IU] | Freq: Every day | SUBCUTANEOUS | Status: DC

## 2014-05-01 MED ORDER — SODIUM CHLORIDE 0.9 % IV SOLN
INTRAVENOUS | Status: DC
  Administered 2014-05-01: 08:00:00 via INTRAVENOUS

## 2014-05-01 NOTE — Discharge Summary (Signed)
Physician Discharge Summary  Pedro [name] I[facility]:[id number] DOB: April 29, 1995 DOA: 04/30/2014  PCP: Chevis Pretty, FNP  Admit date: 04/30/2014 Discharge date: 05/01/2014  Time spent: 45 minutes  Recommendations for Outpatient Follow-up:  -Will be discharged home today. -Advised to follow up with PCP in 2 weeks. -Advised to keep a CBG log and present to PCP at time of follow up for further insulin titration.   Discharge Diagnoses:  Principal Problem:   DKA (diabetic ketoacidoses) Active Problems:   Leukocytosis   Discharge Condition: Stable and improved  Filed Weights   04/30/14 1204 05/01/14 0500  Weight: 63.504 kg (140 lb) 63.5 kg (139 lb 15.9 oz)    History of present illness:  19 y/o well known to [place] for repeated DKA admissions. Was seen in the Ed yesterday for n/v and after improvement was sent home. He decided not to take his insulin yesterday and started having n/v this am that prompted a return visit to the ED. Found to be in DKA with a bicarb of 14 and an AG of 14. Has refused abdominal/chest x ray. We have been asked to admit him for further evaluation and management.  Hospital Course:   DKA -Resolved. -Due to medication non-compliance. -Have reinforced with him importance of using his insulin. -Will f/u with PCP in 2 weeks with his CBG log for further insulin titration.  Procedures:  Non e  Consultations:  None  Discharge Instructions  Discharge Instructions    Diet Carb Modified    Complete by:  As directed      Increase activity slowly    Complete by:  As directed             Medication List    STOP taking these medications        azithromycin 500 MG tablet  Commonly known as:  ZITHROMAX      TAKE these medications        gabapentin 400 MG capsule  Commonly known as:  NEURONTIN  Take 1 cap AM , 1 cap at  [facility] and 2 cap at bedtime     HYDROcodone-acetaminophen 5-325 MG per tablet  Commonly known as:  NORCO  Take 1 tablet by  mouth every 6 (six) hours as needed for moderate pain.     insulin aspart 100 UNIT/ML FlexPen  Commonly known as:  NOVOLOG FLEXPEN  Inject 5-10 Units into the skin 3 (three) times daily with meals.     Insulin Glargine 100 UNIT/ML Solostar Pen  Commonly known as:  LANTUS  Inject 48 units + 1 units as needed to prime pen (total of 49 units) once daily.     Insulin Pen Needle 31G X 6 MM Misc  To check BG 3x daily. Dx E10.10     valACYclovir 1000 MG tablet  Commonly known as:  VALTREX  Take 1 tablet (1,000 mg total) by mouth 2 (two) times daily.       Allergies  Allergen Reactions  . Sulfa Antibiotics Hives    fever       Follow-up Information    Follow up with Chevis Pretty, FNP. Schedule an appointment as soon as possible for a visit in 2 weeks.   Specialty:  Nurse Practitioner   Contact information:   [place] [phone]        The results of significant diagnostics from this hospitalization (including imaging, microbiology, ancillary and laboratory) are listed below for reference.    Significant  Diagnostic Studies: No results found.  Microbiology: Recent Results (from the past 240 hour(s))  MRSA PCR Screening     Status: None   Collection Time: 04/30/14  3:30 PM  Result Value Ref Range Status   MRSA by PCR NEGATIVE NEGATIVE Final    Comment:        The GeneXpert MRSA Assay (FDA approved for NASAL specimens only), is one component of a comprehensive MRSA colonization surveillance program. It is not intended to diagnose MRSA infection nor to guide or monitor treatment for MRSA infections.      Labs: Basic Metabolic Panel:  Recent Labs Lab 04/29/14 0304 04/30/14 1215 04/30/14 1816 04/30/14 2213 05/01/14 0456  NA 133* 131* 134* 135 140  K 4.0 4.2 4.0 3.3* 3.3*  CL 100 103 112 110 110  CO2 26 14* 18* 24 22  GLUCOSE 697* 381* 170* 119* 50*  BUN 11 20 18 14 13   CREATININE 0.75 0.97 0.73 0.78 0.67    CALCIUM 8.7 8.9 7.8* 7.8* 8.4   Liver Function Tests:  Recent Labs Lab 04/29/14 0304 04/30/14 1215  AST 12 12  ALT 10 12  ALKPHOS 73 78  BILITOT 1.1 2.7*  PROT 6.8 7.4  ALBUMIN 4.2 4.5   No results for input(s): LIPASE, AMYLASE in the last 168 hours. No results for input(s): AMMONIA in the last 168 hours. CBC:  Recent Labs Lab 04/29/14 0304 04/30/14 1215  WBC 18.7* 12.8*  NEUTROABS 15.4* 10.1*  HGB 14.9 17.1*  HCT 44.8 49.9  MCV 94.9 92.8  PLT 285 327   Cardiac Enzymes: No results for input(s): CKTOTAL, CKMB, CKMBINDEX, TROPONINI in the last 168 hours. BNP: BNP (last 3 results) No results for input(s): BNP in the last 8760 hours.  ProBNP (last 3 results) No results for input(s): PROBNP in the last 8760 hours.  CBG:  Recent Labs Lab 05/01/14 0056 05/01/14 0204 05/01/14 0619 05/01/14 0641 05/01/14 0744  GLUCAP 93 102* 41* 86 105*       Signed:  HERNANDEZ ACOSTA,ESTELA  [facility] Pager: [phone] 05/01/2014, 9:48 AM

## 2014-05-01 NOTE — Progress Notes (Signed)
Inpatient Diabetes Program Recommendations  AACE/ADA: New Consensus Statement on Inpatient Glycemic Control (2013)  Target Ranges:  Prepandial:   less than 140 mg/dL      Peak postprandial:   less than 180 mg/dL (1-2 hours)      Critically ill patients:  140 - 180 mg/dL   Results for Pedro [name], Pedro [name] (MRN [id number]) as of 05/01/2014 07:31  Ref. Range 04/30/2014 20:45 04/30/2014 21:43 04/30/2014 22:45 04/30/2014 23:38 05/01/2014 00:56 05/01/2014 02:04 05/01/2014 06:19 05/01/2014 06:41  Glucose-Capillary Latest Range: 70-99 mg/dL 143 (H) 155 (H) 113 (H) 111 (H) 93 102 (H) 41 (LL) 86    Diabetes history: DM1 Outpatient Diabetes medications: Lantus 48 units daily, Novolog 5-10 units TID with meals Current orders for Inpatient glycemic control: Lantus 49 units QHS, Novolog 0-15 units TID with meals, Novolog 0-5 units HS  Inpatient Diabetes Program Recommendations Insulin - Basal: Patient has been transitioned off IV insulin to SQ insulin. Patient was given Lantus 49 units at 00:17 and noted glucose down to 41 mg/dl this morning. Please consider decreasing Lantus to 45 units QHS. Correction (SSI): Patient has Type 1 DM and is sensitive to insulin. Please consider decreasing Novolog correction to sensitive correction scale. Insulin - Meal Coverage: Patient has Type 1 DM and will require insulin coverage for carbohydrates. If patient is eating, please consider ordering Novolog 4 units TID with meals for meal coverage.  Thanks, Barnie Alderman, RN, MSN, CCRN, CDE Diabetes Coordinator Inpatient Diabetes Program [phone] (Team Pager) [phone] ([facility] office) [phone] [facility] office)

## 2014-05-01 NOTE — Progress Notes (Signed)
D/c instructions reviewed with patient.  Verbalized understanding.  Pt dc'd to home with family. Schonewitz, Eulis Canner 05/01/2014

## 2014-05-01 NOTE — Progress Notes (Signed)
Glucose on BMP this morning was 50, confirmed with CBG of 41, patient alert/oriented, given juice to drink, repeat CBG was 86.

## 2014-05-01 NOTE — Care Management Note (Signed)
    Page 1 of 1   05/01/2014     3:01:57 PM CARE MANAGEMENT NOTE 05/01/2014  Patient:  Pedro [name], Pedro [name]   Account Number:  [id number]  Date Initiated:  05/01/2014  Documentation initiated by:  CHILDRESS,JESSICA  Subjective/Objective Assessment:   Pt is from home, admitted with DKA. Pt independent. No CM needs. Pt dishcaring today.     Action/Plan:   Anticipated DC Date:  05/01/2014   Anticipated DC Plan:  [place]  CM consult      Choice offered to / List presented to:             Status of service:  Completed, signed off Medicare Important Message given?   (If response is "NO", the following Medicare IM given date fields will be blank) Date Medicare IM given:   Medicare IM given by:   Date Additional Medicare IM given:   Additional Medicare IM given by:    Discharge Disposition:  HOME/SELF CARE  Per UR Regulation:  Reviewed for med. necessity/level of care/duration of stay  If discussed at [place] of Stay Meetings, dates discussed:    Comments:  05/01/2014 [place], RN, MSN, CM

## 2014-05-01 NOTE — Care Management Utilization Note (Signed)
UR completed 

## 2014-05-07 ENCOUNTER — Ambulatory Visit: Payer: Medicaid Other | Admitting: Nurse Practitioner

## 2014-05-15 ENCOUNTER — Encounter (HOSPITAL_COMMUNITY): Payer: Self-pay

## 2014-05-15 ENCOUNTER — Inpatient Hospital Stay (HOSPITAL_COMMUNITY)
Admission: EM | Admit: 2014-05-15 | Discharge: 2014-05-17 | DRG: 639 | Disposition: A | Discharge status: Home / Self Care | Payer: Medicaid Other | Attending: Internal Medicine | Admitting: Internal Medicine

## 2014-05-15 DIAGNOSIS — E1011 Type 1 diabetes mellitus with ketoacidosis with coma: Secondary | ICD-10-CM

## 2014-05-15 DIAGNOSIS — F17210 Nicotine dependence, cigarettes, uncomplicated: Secondary | ICD-10-CM | POA: Diagnosis present

## 2014-05-15 DIAGNOSIS — Z9114 Patient's other noncompliance with medication regimen: Secondary | ICD-10-CM | POA: Diagnosis present

## 2014-05-15 DIAGNOSIS — F909 Attention-deficit hyperactivity disorder, unspecified type: Secondary | ICD-10-CM | POA: Diagnosis present

## 2014-05-15 DIAGNOSIS — D72829 Elevated white blood cell count, unspecified: Secondary | ICD-10-CM | POA: Diagnosis present

## 2014-05-15 DIAGNOSIS — A6000 Herpesviral infection of urogenital system, unspecified: Secondary | ICD-10-CM | POA: Diagnosis present

## 2014-05-15 DIAGNOSIS — E1010 Type 1 diabetes mellitus with ketoacidosis without coma: Principal | ICD-10-CM | POA: Diagnosis present

## 2014-05-15 DIAGNOSIS — Z794 Long term (current) use of insulin: Secondary | ICD-10-CM

## 2014-05-15 DIAGNOSIS — E1110 Type 2 diabetes mellitus with ketoacidosis without coma: Secondary | ICD-10-CM | POA: Diagnosis present

## 2014-05-15 DIAGNOSIS — Z833 Family history of diabetes mellitus: Secondary | ICD-10-CM

## 2014-05-15 LAB — COMPREHENSIVE METABOLIC PANEL
ALBUMIN: 4.3 g/dL (ref 3.5–5.2)
ALK PHOS: 91 U/L (ref 39–117)
ALT: 13 U/L (ref 0–53)
AST: 19 U/L (ref 0–37)
Anion gap: 21 — ABNORMAL HIGH (ref 5–15)
BUN: 26 mg/dL — ABNORMAL HIGH (ref 6–23)
CO2: 13 mmol/L — AB (ref 19–32)
CREATININE: 1.18 mg/dL (ref 0.50–1.35)
Calcium: 8.9 mg/dL (ref 8.4–10.5)
Chloride: 101 mmol/L (ref 96–112)
GFR calc Af Amer: >90 mL/min (ref >90)
GFR calc non Af Amer: 88 mL/min — ABNORMAL LOW (ref >90)
Glucose, Bld: 484 mg/dL — ABNORMAL HIGH (ref 70–99)
Potassium: 5 mmol/L (ref 3.5–5.1)
Sodium: 135 mmol/L (ref 135–145)
TOTAL PROTEIN: 7.4 g/dL (ref 6.0–8.3)
Total Bilirubin: 3 mg/dL — ABNORMAL HIGH (ref 0.3–1.2)

## 2014-05-15 LAB — CBC WITH DIFFERENTIAL/PLATELET
Basophils Absolute: 0 10*3/uL (ref 0.0–0.1)
Basophils Relative: 0 % (ref 0–1)
Eosinophils Absolute: 0 10*3/uL (ref 0.0–0.7)
Eosinophils Relative: 0 % (ref 0–5)
HCT: 51.5 % (ref 39.0–52.0)
HEMOGLOBIN: 17.3 g/dL — AB (ref 13.0–17.0)
Lymphocytes Relative: 6 % — ABNORMAL LOW (ref 12–46)
Lymphs Abs: 1.1 10*3/uL (ref 0.7–4.0)
MCH: 31.6 pg (ref 26.0–34.0)
MCHC: 33.6 g/dL (ref 30.0–36.0)
MCV: 94 fL (ref 78.0–100.0)
MONO ABS: 0.5 10*3/uL (ref 0.1–1.0)
Monocytes Relative: 3 % (ref 3–12)
NEUTROS PCT: 92 % — AB (ref 43–77)
Neutro Abs: 16.8 10*3/uL — ABNORMAL HIGH (ref 1.7–7.7)
Platelets: 263 10*3/uL (ref 150–400)
RBC: 5.48 MIL/uL (ref 4.22–5.81)
RDW: 12.7 % (ref 11.5–15.5)
WBC MORPHOLOGY: INCREASED
WBC: 18.4 10*3/uL — AB (ref 4.0–10.5)

## 2014-05-15 LAB — CBG MONITORING, ED
GLUCOSE-CAPILLARY: 481 mg/dL — AB (ref 70–99)
Glucose-Capillary: 465 mg/dL — ABNORMAL HIGH (ref 70–99)

## 2014-05-15 MED ORDER — SODIUM CHLORIDE 0.9 % IV SOLN
1000.0000 mL | Freq: Once | INTRAVENOUS | Status: AC
  Administered 2014-05-15: 1000 mL via INTRAVENOUS

## 2014-05-15 MED ORDER — SODIUM CHLORIDE 0.9 % IV SOLN: 1000.0000 mL | INTRAVENOUS | Status: DC

## 2014-05-15 MED ORDER — SODIUM CHLORIDE 0.9 % IV SOLN
INTRAVENOUS | Status: DC
  Filled 2014-05-15: qty 2.5

## 2014-05-15 MED ORDER — ONDANSETRON HCL 4 MG/2ML IJ SOLN
4.0000 mg | Freq: Once | INTRAMUSCULAR | Status: AC
  Administered 2014-05-15: 4 mg via INTRAVENOUS
  Filled 2014-05-15: qty 2

## 2014-05-15 NOTE — ED Provider Notes (Signed)
CSN: [id number]     Arrival date & time 05/15/14  2205 History  This chart was scribed for [name], MD by Delphia Grates, ED Scribe. This patient was seen in room APA01/APA01 and the patient's care was started at 11:33 PM.   Chief Complaint  Patient presents with  . Emesis  . Fever    The history is provided by the patient. No language interpreter was used.     HPI Comments: Pedro [name] is a 19 y.o. male, with history of DM, brought in by ambulance, who presents to the Emergency Department complaining of subjective fever (triage temp 98.3 F) with associated nausea and intermittent vomiting onset PTA. Patient is currently on insulin, but has been hyperglycemic all day today. He reports history of DKA and notes this feels similar. He was given Zofran by EMS en route to the ED with some relief of nausea. He denies abdominal pain or any other symptoms. CBG 465.    Past Medical History  Diagnosis Date  . ADHD (attention deficit hyperactivity disorder)   . DM type 1 (diabetes mellitus, type 1)     diagnosed age 26  . Herpes   . Noncompliance with medications    Past Surgical History  Procedure Laterality Date  . None     Family History  Problem Relation Age of Onset  . Diabetes Mellitus I Paternal Grandmother   . Diabetes Mellitus I Paternal Aunt    History  Substance Use Topics  . Smoking status: Current Every Day Smoker -- 0.50 packs/day for 2 years    Types: Cigarettes  . Smokeless tobacco: Current User  . Alcohol Use: Yes     Comment: occassional- denies    Review of Systems  A complete 10 system review of systems was obtained and all systems are negative except as noted in the HPI and PMH.    Allergies  Sulfa antibiotics  Home Medications   Prior to Admission medications   Medication Sig Start Date End Date Taking? Authorizing Provider  gabapentin (NEURONTIN) 400 MG capsule Take 1 cap AM , 1 cap at  [facility] and 2 cap at bedtime Patient taking  differently: Take 400-800 mg by mouth 3 (three) times daily. Take 1 capsule in the morning, 1 capsule at  [facility] and 2 capsules at bedtime. 04/23/14  Yes Chipper Herb, MD  insulin aspart (NOVOLOG FLEXPEN) 100 UNIT/ML FlexPen Inject 5-10 Units into the skin 3 (three) times daily with meals. 04/08/14  Yes Lysbeth Penner, FNP  Insulin Glargine (LANTUS) 100 UNIT/ML Solostar Pen Inject 48 units + 1 units as needed to prime pen (total of 49 units) once daily. Patient taking differently: Inject 48 Units into the skin daily. Inject 48 units + 1 units as needed to prime pen (total of 49 units) once daily. 04/08/14  Yes Lysbeth Penner, FNP  valACYclovir (VALTREX) 1000 MG tablet Take 1 tablet (1,000 mg total) by mouth 2 (two) times daily. 04/16/14  Yes Lysbeth Penner, FNP  Insulin Pen Needle 31G X 6 MM MISC To check BG 3x daily. Dx E10.10 04/08/14   Lysbeth Penner, FNP   Triage Vitals: BP 133/76 mmHg  Pulse 109  Temp(Src) 98.3 F (36.8 C) (Oral)  Resp 20  Ht 5\' 11"  (1.803 m)  Wt 140 lb (63.504 kg)  BMI 19.53 kg/m2  SpO2 100%  Physical Exam  Constitutional: He is oriented to person, place, and time. He appears well-developed and well-nourished. No distress.  HENT:  Head: Normocephalic and atraumatic.  Mouth/Throat: Mucous membranes are dry.  Eyes: Conjunctivae and EOM are normal.  Neck: Neck supple. No tracheal deviation present.  Cardiovascular: Normal rate, regular rhythm and normal heart sounds.   No murmur heard. Pulmonary/Chest: Effort normal and breath sounds normal. No respiratory distress. He has no wheezes.  Abdominal: Soft. He exhibits no distension. There is no tenderness.  Musculoskeletal: Normal range of motion. He exhibits no edema.  Neurological: He is alert and oriented to person, place, and time.  Skin: Skin is warm and dry.  Psychiatric: He has a normal mood and affect. His behavior is normal.  Nursing note and vitals reviewed.   ED Course  Procedures (including critical  care time)  DIAGNOSTIC STUDIES: Oxygen Saturation is 100% on room air, normal by my interpretation.    COORDINATION OF CARE: At 2338 Discussed treatment plan with patient which includes admission. Patient agrees.   Labs Review Labs Reviewed  CBC WITH DIFFERENTIAL/PLATELET - Abnormal; Notable for the following:    WBC 18.4 (*)    Hemoglobin 17.3 (*)    Neutrophils Relative % 92 (*)    Neutro Abs 16.8 (*)    Lymphocytes Relative 6 (*)    All other components within normal limits  COMPREHENSIVE METABOLIC PANEL - Abnormal; Notable for the following:    CO2 13 (*)    Glucose, Bld 484 (*)    BUN 26 (*)    Total Bilirubin 3.0 (*)    GFR calc non Af Amer 88 (*)    Anion gap 21 (*)    All other components within normal limits  CBG MONITORING, ED - Abnormal; Notable for the following:    Glucose-Capillary 481 (*)    All other components within normal limits  CBG MONITORING, ED - Abnormal; Notable for the following:    Glucose-Capillary 465 (*)    All other components within normal limits    Imaging Review No results found.   EKG Interpretation None      MDM   Final diagnoses:  None    Patient is a 19 year old male with history of type 1 diabetes. He presents with complaints of "not feeling well", vomiting, and elevated blood sugar at home. His sugar here is 481 and the electrolyte panel is reflective of diabetic ketoacidosis. He was given 2 L of saline in the ER and started on a glucose stabilizer. I've spoken with Dr. Darrick Meigs from the hospitalist service who agrees to admit.  CRITICAL CARE Performed by: [name] Total critical care time: 30 minutes Critical care time was exclusive of separately billable procedures and treating other patients. Critical care was necessary to treat or prevent imminent or life-threatening deterioration. Critical care was time spent personally by me on the following activities: development of treatment plan with patient and/or surrogate as  well as nursing, discussions with consultants, evaluation of patient's response to treatment, examination of patient, obtaining history from patient or surrogate, ordering and performing treatments and interventions, ordering and review of laboratory studies, ordering and review of radiographic studies, pulse oximetry and re-evaluation of patient's condition.   I personally performed the services described in this documentation, which was scribed in my presence. The recorded information has been reviewed and is accurate.      [name], MD 05/16/14 [phone]

## 2014-05-15 NOTE — ED Notes (Signed)
Pt with hyperglycemia all day, has been vomiting and running a fever at home.  Pt received zofran en route per ems with improvement of nausea

## 2014-05-16 ENCOUNTER — Emergency Department (HOSPITAL_COMMUNITY): Payer: Medicaid Other

## 2014-05-16 DIAGNOSIS — E1010 Type 1 diabetes mellitus with ketoacidosis without coma: Secondary | ICD-10-CM | POA: Diagnosis not present

## 2014-05-16 DIAGNOSIS — Z794 Long term (current) use of insulin: Secondary | ICD-10-CM | POA: Diagnosis not present

## 2014-05-16 DIAGNOSIS — A6000 Herpesviral infection of urogenital system, unspecified: Secondary | ICD-10-CM

## 2014-05-16 DIAGNOSIS — D72829 Elevated white blood cell count, unspecified: Secondary | ICD-10-CM | POA: Diagnosis not present

## 2014-05-16 DIAGNOSIS — Z833 Family history of diabetes mellitus: Secondary | ICD-10-CM | POA: Diagnosis not present

## 2014-05-16 DIAGNOSIS — R112 Nausea with vomiting, unspecified: Secondary | ICD-10-CM | POA: Diagnosis present

## 2014-05-16 DIAGNOSIS — Z9114 Patient's other noncompliance with medication regimen: Secondary | ICD-10-CM | POA: Diagnosis present

## 2014-05-16 DIAGNOSIS — F17210 Nicotine dependence, cigarettes, uncomplicated: Secondary | ICD-10-CM | POA: Diagnosis present

## 2014-05-16 DIAGNOSIS — F909 Attention-deficit hyperactivity disorder, unspecified type: Secondary | ICD-10-CM | POA: Diagnosis present

## 2014-05-16 LAB — BASIC METABOLIC PANEL
ANION GAP: 15 (ref 5–15)
ANION GAP: 4 — AB (ref 5–15)
Anion gap: 8 (ref 5–15)
Anion gap: 7 (ref 5–15)
Anion gap: 7 (ref 5–15)
Anion gap: 5 (ref 5–15)
BUN: 27 mg/dL — ABNORMAL HIGH (ref 6–23)
BUN: 25 mg/dL — ABNORMAL HIGH (ref 6–23)
BUN: 23 mg/dL (ref 6–23)
BUN: 24 mg/dL — AB (ref 6–23)
BUN: 23 mg/dL (ref 6–23)
BUN: 18 mg/dL (ref 6–23)
CALCIUM: 8.3 mg/dL — AB (ref 8.4–10.5)
CALCIUM: 8.1 mg/dL — AB (ref 8.4–10.5)
CALCIUM: 8 mg/dL — AB (ref 8.4–10.5)
CALCIUM: 8.1 mg/dL — AB (ref 8.4–10.5)
CHLORIDE: 110 mmol/L (ref 96–112)
CO2: 13 mmol/L — ABNORMAL LOW (ref 19–32)
CO2: 17 mmol/L — AB (ref 19–32)
CO2: 19 mmol/L (ref 19–32)
CO2: 20 mmol/L (ref 19–32)
CO2: 24 mmol/L (ref 19–32)
CO2: 24 mmol/L (ref 19–32)
CREATININE: 1.19 mg/dL (ref 0.50–1.35)
CREATININE: 0.86 mg/dL (ref 0.50–1.35)
CREATININE: 0.75 mg/dL (ref 0.50–1.35)
CREATININE: 0.7 mg/dL (ref 0.50–1.35)
Calcium: 8.2 mg/dL — ABNORMAL LOW (ref 8.4–10.5)
Calcium: 8.1 mg/dL — ABNORMAL LOW (ref 8.4–10.5)
Chloride: 109 mmol/L (ref 96–112)
Chloride: 110 mmol/L (ref 96–112)
Chloride: 106 mmol/L (ref 96–112)
Chloride: 106 mmol/L (ref 96–112)
Chloride: 107 mmol/L (ref 96–112)
Creatinine, Ser: 0.66 mg/dL (ref 0.50–1.35)
Creatinine, Ser: 0.69 mg/dL (ref 0.50–1.35)
GFR calc Af Amer: >90 mL/min (ref >90)
GFR calc Af Amer: >90 mL/min (ref >90)
GFR calc Af Amer: >90 mL/min (ref >90)
GFR calc Af Amer: >90 mL/min (ref >90)
GFR calc non Af Amer: 87 mL/min — ABNORMAL LOW (ref >90)
GFR calc non Af Amer: >90 mL/min (ref >90)
GFR calc non Af Amer: >90 mL/min (ref >90)
GLUCOSE: 249 mg/dL — AB (ref 70–99)
Glucose, Bld: 362 mg/dL — ABNORMAL HIGH (ref 70–99)
Glucose, Bld: 228 mg/dL — ABNORMAL HIGH (ref 70–99)
Glucose, Bld: 121 mg/dL — ABNORMAL HIGH (ref 70–99)
Glucose, Bld: 233 mg/dL — ABNORMAL HIGH (ref 70–99)
Glucose, Bld: 231 mg/dL — ABNORMAL HIGH (ref 70–99)
POTASSIUM: 3.9 mmol/L (ref 3.5–5.1)
Potassium: 4.3 mmol/L (ref 3.5–5.1)
Potassium: 4 mmol/L (ref 3.5–5.1)
Potassium: 3.9 mmol/L (ref 3.5–5.1)
Potassium: 3.7 mmol/L (ref 3.5–5.1)
Potassium: 3.7 mmol/L (ref 3.5–5.1)
SODIUM: 138 mmol/L (ref 135–145)
Sodium: 134 mmol/L — ABNORMAL LOW (ref 135–145)
Sodium: 136 mmol/L (ref 135–145)
Sodium: 133 mmol/L — ABNORMAL LOW (ref 135–145)
Sodium: 135 mmol/L (ref 135–145)
Sodium: 135 mmol/L (ref 135–145)

## 2014-05-16 LAB — URINALYSIS, ROUTINE W REFLEX MICROSCOPIC
BILIRUBIN URINE: NEGATIVE
Glucose, UA: 500 mg/dL — AB
HGB URINE DIPSTICK: NEGATIVE
Ketones, ur: >80 mg/dL — AB
Leukocytes, UA: NEGATIVE
Nitrite: NEGATIVE
Protein, ur: NEGATIVE mg/dL
SPECIFIC GRAVITY, URINE: 1.025 (ref 1.005–1.030)
UROBILINOGEN UA: 0.2 mg/dL (ref 0.0–1.0)
pH: 5.5 (ref 5.0–8.0)

## 2014-05-16 LAB — CBG MONITORING, ED: Glucose-Capillary: 393 mg/dL — ABNORMAL HIGH (ref 70–99)

## 2014-05-16 MED ORDER — INSULIN ASPART 100 UNIT/ML ~~LOC~~ SOLN
0.0000 [IU] | Freq: Every day | SUBCUTANEOUS | Status: DC
  Administered 2014-05-16: 2 [IU] via SUBCUTANEOUS

## 2014-05-16 MED ORDER — POTASSIUM CHLORIDE 10 MEQ/100ML IV SOLN
10.0000 meq | INTRAVENOUS | Status: AC
  Administered 2014-05-16 (×2): 10 meq via INTRAVENOUS
  Filled 2014-05-16 (×2): qty 100

## 2014-05-16 MED ORDER — DEXTROSE-NACL 5-0.45 % IV SOLN
INTRAVENOUS | Status: DC
  Administered 2014-05-16: 04:00:00 via INTRAVENOUS

## 2014-05-16 MED ORDER — GABAPENTIN 400 MG PO CAPS
800.0000 mg | ORAL_CAPSULE | Freq: Every day | ORAL | Status: DC
  Administered 2014-05-16 (×2): 800 mg via ORAL
  Filled 2014-05-16 (×2): qty 2

## 2014-05-16 MED ORDER — SODIUM CHLORIDE 0.9 % IV SOLN
INTRAVENOUS | Status: DC
  Administered 2014-05-16 – 2014-05-17 (×2): via INTRAVENOUS

## 2014-05-16 MED ORDER — INSULIN GLARGINE 100 UNIT/ML ~~LOC~~ SOLN
49.0000 [IU] | Freq: Every day | SUBCUTANEOUS | Status: DC
  Administered 2014-05-16: 49 [IU] via SUBCUTANEOUS
  Filled 2014-05-16 (×5): qty 0.49

## 2014-05-16 MED ORDER — ENOXAPARIN SODIUM 40 MG/0.4ML ~~LOC~~ SOLN
40.0000 mg | SUBCUTANEOUS | Status: DC
  Filled 2014-05-16 (×2): qty 0.4

## 2014-05-16 MED ORDER — INSULIN ASPART 100 UNIT/ML ~~LOC~~ SOLN
0.0000 [IU] | Freq: Three times a day (TID) | SUBCUTANEOUS | Status: DC
  Administered 2014-05-16: 2 [IU] via SUBCUTANEOUS
  Administered 2014-05-17: 3 [IU] via SUBCUTANEOUS

## 2014-05-16 MED ORDER — SODIUM CHLORIDE 0.9 % IV SOLN
INTRAVENOUS | Status: DC
  Administered 2014-05-16: 02:00:00 via INTRAVENOUS

## 2014-05-16 MED ORDER — ONDANSETRON HCL 4 MG/2ML IJ SOLN: 4.0000 mg | Freq: Four times a day (QID) | INTRAMUSCULAR | Status: DC | PRN

## 2014-05-16 MED ORDER — SODIUM CHLORIDE 0.9 % IV SOLN
INTRAVENOUS | Status: DC
  Administered 2014-05-16: 7.4 [IU]/h via INTRAVENOUS
  Filled 2014-05-16: qty 2.5

## 2014-05-16 MED ORDER — GABAPENTIN 400 MG PO CAPS
400.0000 mg | ORAL_CAPSULE | ORAL | Status: DC
Start: 2014-05-16 — End: 2014-05-17
  Administered 2014-05-16 – 2014-05-17 (×4): 400 mg via ORAL
  Filled 2014-05-16: qty 4
  Filled 2014-05-16 (×4): qty 1

## 2014-05-16 MED ORDER — VALACYCLOVIR HCL 500 MG PO TABS
ORAL_TABLET | ORAL | Status: AC
  Filled 2014-05-16: qty 2

## 2014-05-16 MED ORDER — VALACYCLOVIR HCL 500 MG PO TABS
1000.0000 mg | ORAL_TABLET | Freq: Two times a day (BID) | ORAL | Status: DC
  Administered 2014-05-16 – 2014-05-17 (×4): 1000 mg via ORAL
  Filled 2014-05-16 (×8): qty 2

## 2014-05-16 NOTE — H&P (Signed)
PCP:   Chevis Pretty, FNP   Chief Complaint:  Nausea and vomiting  HPI: 19 year old male who   has a past medical history of ADHD (attention deficit hyperactivity disorder); DM type 1 (diabetes mellitus, type 1); Herpes; and Noncompliance with medications. Today presents to the ED with chief complaint of nausea and vomiting, started this morning. Patient says that he did not take his insulin today as he was very weak. In the ED patient was found to be in DKA with A G of 21. He also complains of fever, denies chest pain no shortness of breath or coughing, no dysuria urgency frequency of urination. He denies abdominal pain but complains of headache and back pain.   Allergies:   Allergies  Allergen Reactions  . Sulfa Antibiotics Hives    fever      Past Medical History  Diagnosis Date  . ADHD (attention deficit hyperactivity disorder)   . DM type 1 (diabetes mellitus, type 1)     diagnosed age 1  . Herpes   . Noncompliance with medications     Past Surgical History  Procedure Laterality Date  . None      Prior to Admission medications   Medication Sig Start Date End Date Taking? Authorizing Provider  gabapentin (NEURONTIN) 400 MG capsule Take 1 cap AM , 1 cap at  [facility] and 2 cap at bedtime Patient taking differently: Take 400-800 mg by mouth 3 (three) times daily. Take 1 capsule in the morning, 1 capsule at  [facility] and 2 capsules at bedtime. 04/23/14  Yes Chipper Herb, MD  insulin aspart (NOVOLOG FLEXPEN) 100 UNIT/ML FlexPen Inject 5-10 Units into the skin 3 (three) times daily with meals. 04/08/14  Yes Lysbeth Penner, FNP  Insulin Glargine (LANTUS) 100 UNIT/ML Solostar Pen Inject 48 units + 1 units as needed to prime pen (total of 49 units) once daily. Patient taking differently: Inject 48 Units into the skin daily. Inject 48 units + 1 units as needed to prime pen (total of 49 units) once daily. 04/08/14  Yes Lysbeth Penner, FNP  valACYclovir (VALTREX) 1000 MG tablet  Take 1 tablet (1,000 mg total) by mouth 2 (two) times daily. 04/16/14  Yes Lysbeth Penner, FNP  Insulin Pen Needle 31G X 6 MM MISC To check BG 3x daily. Dx E10.10 04/08/14   Lysbeth Penner, FNP    Social History:  reports that he has been smoking Cigarettes.  He has a 1 pack-year smoking history. He uses smokeless tobacco. He reports that he drinks alcohol. He reports that he uses illicit drugs (Marijuana, Other-see comments, Benzodiazepines, Hydrocodone, and Oxycodone).  Family History  Problem Relation Age of Onset  . Diabetes Mellitus I Paternal Grandmother   . Diabetes Mellitus I Paternal Aunt      All the positives are listed in BOLD  Review of Systems:  HEENT: Headache, blurred vision, runny nose, sore throat Neck: Hypothyroidism, hyperthyroidism,,lymphadenopathy Chest : Shortness of breath, history of COPD, Asthma Heart : Chest pain, history of coronary arterey disease  GI:  Nausea, vomiting, diarrhea, constipation, GERD GU: Dysuria, urgency, frequency of urination, hematuria Neuro: Stroke, seizures, syncope Psych: Depression, anxiety, hallucinations   Physical Exam: Blood pressure 122/55, pulse 117, temperature 98.3 F (36.8 C), temperature source Oral, resp. rate 20, height 5\' 11"  (1.803 m), weight 63.504 kg (140 lb), SpO2 100 %. Constitutional:   Patient is a well-developed and well-nourished male in no acute distress and cooperative with exam. Head: Normocephalic and  atraumatic Mouth: Mucus membranes moist Eyes: PERRL, EOMI, conjunctivae normal Neck: Supple, No Thyromegaly Cardiovascular: RRR, S1 normal, S2 normal Pulmonary/Chest: CTAB, no wheezes, rales, or rhonchi Abdominal: Soft. Non-tender, non-distended, bowel sounds are normal, no masses, organomegaly, or guarding present.  Neurological: A&O x3, Strength is normal and symmetric bilaterally, cranial nerve II-XII are grossly intact, no focal motor deficit, sensory intact to light touch bilaterally.  Extremities  : No Cyanosis, Clubbing or Edema  Labs on Admission:  Basic Metabolic Panel:  Recent Labs Lab 05/15/14 2229  NA 135  K 5.0  CL 101  CO2 13*  GLUCOSE 484*  BUN 26*  CREATININE 1.18  CALCIUM 8.9   Liver Function Tests:  Recent Labs Lab 05/15/14 2229  AST 19  ALT 13  ALKPHOS 91  BILITOT 3.0*  PROT 7.4  ALBUMIN 4.3   No results for input(s): LIPASE, AMYLASE in the last 168 hours. No results for input(s): AMMONIA in the last 168 hours. CBC:  Recent Labs Lab 05/15/14 2229  WBC 18.4*  NEUTROABS 16.8*  HGB 17.3*  HCT 51.5  MCV 94.0  PLT 263   Cardiac Enzymes: No results for input(s): CKTOTAL, CKMB, CKMBINDEX, TROPONINI in the last 168 hours.  BNP (last 3 results) No results for input(s): BNP in the last 8760 hours.  ProBNP (last 3 results) No results for input(s): PROBNP in the last 8760 hours.  CBG:  Recent Labs Lab 05/15/14 2213 05/15/14 2322  GLUCAP 481* 465*    Radiological Exams on Admission: No results found.     Assessment/Plan Active Problems:   Leukocytosis   Genital herpes simplex type 2   Diabetic ketoacidosis without coma associated with type 1 diabetes mellitus   DKA (diabetic ketoacidoses)  DKA Patient will be started on DKA protocol, will start IV insulin and check BMP every 4 hours. Anion gap is 21  Genital herpes Patient has been taking Valtrex 1000 units twice a day which will  be continued.  Leukocytosis Patient has leukocytosis of 18,000 and low-grade fever.? Secondary to above ,will check UA and chest x-ray.  DVT prophylaxis Lovenox  Code status:  Family discussion: No family at bedside   Time Spent on Admission: 32 minutes  [facility] Pager: [phone] 05/16/2014, 12:43 AM  If 7PM-7AM, please contact night-coverage  [email or web address]  Password [email or web address]

## 2014-05-16 NOTE — Care Management Utilization Note (Signed)
UR completed 

## 2014-05-16 NOTE — Progress Notes (Signed)
Patient admitted to the hospital earlier this morning by Dr. Darrick Meigs  Patient seen and examined  He was admitted with diabetic ketoacidosis. He is a type 1 diabetic who has had frequent admissions for the same. Chest x-ray does not show any acute findings. Urinalysis did not show any signs of infection. The patient is feeling improved at this time. Continue hydration and intravenous insulin until anion gap is closed.  Pedro [name]

## 2014-05-16 NOTE — Care Management Note (Signed)
    Page 1 of 1   05/16/2014     4:10:55 PM CARE MANAGEMENT NOTE 05/16/2014  Patient:  Pedro [name], Pedro [name]   Account Number:  [id number]  Date Initiated:  05/16/2014  Documentation initiated by:  CHILDRESS,JESSICA  Subjective/Objective Assessment:   Pt is from home and independent at baseline. Pt plans to discharge home with self care. No CM needs.     Action/Plan:   Anticipated DC Date:  05/18/2014   Anticipated DC Plan:  HOME/SELF CARE      DC Planning Services  CM consult      Choice offered to / List presented to:             Status of service:  Completed, signed off Medicare Important Message given?   (If response is "NO", the following Medicare IM given date fields will be blank) Date Medicare IM given:   Medicare IM given by:   Date Additional Medicare IM given:   Additional Medicare IM given by:    Discharge Disposition:  HOME/SELF CARE  Per UR Regulation:  Reviewed for med. necessity/level of care/duration of stay  If discussed at [place] of Stay Meetings, dates discussed:    Comments:  05/16/2014 [place], RN, MSN, CM

## 2014-05-17 DIAGNOSIS — D72829 Elevated white blood cell count, unspecified: Secondary | ICD-10-CM

## 2014-05-17 LAB — GLUCOSE, CAPILLARY
GLUCOSE-CAPILLARY: 378 mg/dL — AB (ref 70–99)
GLUCOSE-CAPILLARY: 306 mg/dL — AB (ref 70–99)
GLUCOSE-CAPILLARY: 234 mg/dL — AB (ref 70–99)
GLUCOSE-CAPILLARY: 211 mg/dL — AB (ref 70–99)
GLUCOSE-CAPILLARY: 136 mg/dL — AB (ref 70–99)
GLUCOSE-CAPILLARY: 98 mg/dL (ref 70–99)
GLUCOSE-CAPILLARY: 120 mg/dL — AB (ref 70–99)
GLUCOSE-CAPILLARY: 228 mg/dL — AB (ref 70–99)
GLUCOSE-CAPILLARY: 186 mg/dL — AB (ref 70–99)
GLUCOSE-CAPILLARY: 59 mg/dL — AB (ref 70–99)
GLUCOSE-CAPILLARY: 158 mg/dL — AB (ref 70–99)
Glucose-Capillary: 199 mg/dL — ABNORMAL HIGH (ref 70–99)
Glucose-Capillary: 187 mg/dL — ABNORMAL HIGH (ref 70–99)
Glucose-Capillary: 114 mg/dL — ABNORMAL HIGH (ref 70–99)
Glucose-Capillary: 137 mg/dL — ABNORMAL HIGH (ref 70–99)
Glucose-Capillary: 184 mg/dL — ABNORMAL HIGH (ref 70–99)
Glucose-Capillary: 241 mg/dL — ABNORMAL HIGH (ref 70–99)
Glucose-Capillary: 249 mg/dL — ABNORMAL HIGH (ref 70–99)
Glucose-Capillary: 75 mg/dL (ref 70–99)
Glucose-Capillary: 208 mg/dL — ABNORMAL HIGH (ref 70–99)

## 2014-05-17 LAB — BASIC METABOLIC PANEL
ANION GAP: 7 (ref 5–15)
Anion gap: 5 (ref 5–15)
BUN: 15 mg/dL (ref 6–23)
BUN: 14 mg/dL (ref 6–23)
CALCIUM: 8.1 mg/dL — AB (ref 8.4–10.5)
CALCIUM: 7.9 mg/dL — AB (ref 8.4–10.5)
CO2: 24 mmol/L (ref 19–32)
CO2: 23 mmol/L (ref 19–32)
CREATININE: 0.56 mg/dL (ref 0.50–1.35)
Chloride: 110 mmol/L (ref 96–112)
Chloride: 107 mmol/L (ref 96–112)
Creatinine, Ser: 0.69 mg/dL (ref 0.50–1.35)
GFR calc Af Amer: >90 mL/min (ref >90)
GFR calc non Af Amer: >90 mL/min (ref >90)
Glucose, Bld: 59 mg/dL — ABNORMAL LOW (ref 70–99)
Glucose, Bld: 67 mg/dL — ABNORMAL LOW (ref 70–99)
POTASSIUM: 3.2 mmol/L — AB (ref 3.5–5.1)
Potassium: 3.3 mmol/L — ABNORMAL LOW (ref 3.5–5.1)
SODIUM: 139 mmol/L (ref 135–145)
SODIUM: 137 mmol/L (ref 135–145)

## 2014-05-17 LAB — CBC
HCT: 41.6 % (ref 39.0–52.0)
Hemoglobin: 13.6 g/dL (ref 13.0–17.0)
MCH: 30.6 pg (ref 26.0–34.0)
MCHC: 32.7 g/dL (ref 30.0–36.0)
MCV: 93.5 fL (ref 78.0–100.0)
Platelets: 240 10*3/uL (ref 150–400)
RBC: 4.45 MIL/uL (ref 4.22–5.81)
RDW: 12.9 % (ref 11.5–15.5)
WBC: 12.3 10*3/uL — ABNORMAL HIGH (ref 4.0–10.5)

## 2014-05-17 MED ORDER — INSULIN GLARGINE 100 UNIT/ML ~~LOC~~ SOLN
42.0000 [IU] | Freq: Every day | SUBCUTANEOUS | Status: DC
  Administered 2014-05-17: 42 [IU] via SUBCUTANEOUS
  Filled 2014-05-17 (×3): qty 0.42

## 2014-05-17 MED ORDER — INSULIN ASPART 100 UNIT/ML ~~LOC~~ SOLN
5.0000 [IU] | Freq: Three times a day (TID) | SUBCUTANEOUS | Status: DC
  Administered 2014-05-17: 5 [IU] via SUBCUTANEOUS

## 2014-05-17 MED ORDER — INSULIN GLARGINE 100 UNIT/ML SOLOSTAR PEN: PEN_INJECTOR | SUBCUTANEOUS | Status: DC

## 2014-05-17 NOTE — Progress Notes (Signed)
Patients blood sugar 75 after juice. Dr. Roderic Palau in unit and notified of patients low blood sugar this morning. Will adjust medications per physician

## 2014-05-17 NOTE — Progress Notes (Signed)
Patient sitting up in bed. Alert and oriented. No complaints of pain voiced at this time. Patients blood sugar 59. Patient asymptomatic. Juice given. Will reevaluate.

## 2014-05-17 NOTE — Discharge Summary (Signed)
Physician Discharge Summary  Pedro [name] [id number]:[id number] DOB: 1995-03-26 DOA: 05/15/2014  PCP: Chevis Pretty, FNP  Admit date: 05/15/2014 Discharge date: 05/17/2014  Time spent:40 minutes  Recommendations for Outpatient Follow-up:  1. Follow-up primary care physician in 1-2 weeks  Discharge Diagnoses:  Active Problems:   Leukocytosis   Genital herpes simplex type 2   Diabetic ketoacidosis without coma associated with type 1 diabetes mellitus   DKA (diabetic ketoacidoses)   Discharge Condition: Improved  Diet recommendation: Low-carb  Filed Weights   05/15/14 2240 05/16/14 0141 05/17/14 0400  Weight: 63.504 kg (140 lb) 62.1 kg (136 lb 14.5 oz) 64.4 kg (141 lb 15.6 oz)    History of present illness and hospital course:  This patient was admitted to the hospital with nausea, vomiting and diabetic ketoacidosis. He is a type I diabetic who has frequent admissions for ketoacidosis. The patient was started on intravenous insulin and IV fluids per DKA protocol. With standard treatment, his anion gap closed and DKA resolved. Blood sugars have stabilized. He is tolerating a solid diet. Workup was otherwise unremarkable. His DKA is likely related to noncompliance. Once he was clinically improved, he was discharged home in stable condition.  Procedures:    Consultations:    Discharge Exam: Filed Vitals:   05/17/14 1209  BP:   Pulse:   Temp: 98.1 F (36.7 C)  Resp:     General: NAD Cardiovascular: S1, S2 RRR Respiratory: CTA B  Discharge Instructions   Discharge Instructions    Call MD for:  persistant nausea and vomiting    Complete by:  As directed      Call MD for:  severe uncontrolled pain    Complete by:  As directed      Diet Carb Modified    Complete by:  As directed      Increase activity slowly    Complete by:  As directed           Discharge Medication List as of 05/17/2014  1:34 PM    CONTINUE these medications which have CHANGED   Details   Insulin Glargine (LANTUS) 100 UNIT/ML Solostar Pen Inject 43 units + 1 units as needed to prime pen (total of 44 units) once daily., Normal      CONTINUE these medications which have NOT CHANGED   Details  gabapentin (NEURONTIN) 400 MG capsule Take 1 cap AM , 1 cap at  [facility] and 2 cap at bedtime, Normal    insulin aspart (NOVOLOG FLEXPEN) 100 UNIT/ML FlexPen Inject 5-10 Units into the skin 3 (three) times daily with meals., Starting 04/08/2014, Until Discontinued, Normal    valACYclovir (VALTREX) 1000 MG tablet Take 1 tablet (1,000 mg total) by mouth 2 (two) times daily., Starting 04/16/2014, Until Discontinued, Normal    Insulin Pen Needle 31G X 6 MM MISC To check BG 3x daily. Dx E10.10, Normal       Allergies  Allergen Reactions  . Sulfa Antibiotics Hives    fever   Follow-up Information    Follow up with NIDA,GEBRESELASSIE, MD.   Specialty:  Endocrinology   Why:  call for appointment   Contact information:   [place] [id number] [phone]        The results of significant diagnostics from this hospitalization (including imaging, microbiology, ancillary and laboratory) are listed below for reference.    Significant Diagnostic Studies: Dg Chest 2 View  05/16/2014   CLINICAL DATA:  Hypoglycemia, fever, emesis, and productive cough today.  EXAM: CHEST  2 VIEW  COMPARISON:  03/19/2014  FINDINGS: Hyperinflation. The heart size and mediastinal contours are within normal limits. Both lungs are clear. The visualized skeletal structures are unremarkable.  IMPRESSION: No active cardiopulmonary disease.   Electronically Signed   By: Lucienne Capers M.D.   On: 05/16/2014 01:34    Microbiology: No results found for this or any previous visit (from the past 240 hour(s)).   Labs: Basic Metabolic Panel:  Recent Labs Lab 05/16/14 1406 05/16/14 1800 05/16/14 2131 05/17/14 0249 05/17/14 0541  NA 133* 135 135 139 137  K 3.9 3.7 3.7 3.2* 3.3*  CL 106 106 107  110 107  CO2 20 24 24 24 23   GLUCOSE 249* 233* 231* 59* 67*  BUN 24* 23 18 15 14   CREATININE 0.75 0.70 0.69 0.69 0.56  CALCIUM 8.1* 8.0* 8.1* 8.1* 7.9*   Liver Function Tests:  Recent Labs Lab 05/15/14 2229  AST 19  ALT 13  ALKPHOS 91  BILITOT 3.0*  PROT 7.4  ALBUMIN 4.3   No results for input(s): LIPASE, AMYLASE in the last 168 hours. No results for input(s): AMMONIA in the last 168 hours. CBC:  Recent Labs Lab 05/15/14 2229 05/17/14 0541  WBC 18.4* 12.3*  NEUTROABS 16.8*  --   HGB 17.3* 13.6  HCT 51.5 41.6  MCV 94.0 93.5  PLT 263 240   Cardiac Enzymes: No results for input(s): CKTOTAL, CKMB, CKMBINDEX, TROPONINI in the last 168 hours. BNP: BNP (last 3 results) No results for input(s): BNP in the last 8760 hours.  ProBNP (last 3 results) No results for input(s): PROBNP in the last 8760 hours.  CBG:  Recent Labs Lab 05/16/14 1955 05/17/14 0716 05/17/14 0804 05/17/14 1130 05/17/14 1308  GLUCAP 249* 59* 75 208* 158*       Signed:  Jayshaun Phillips  [facility] 05/17/2014, 6:53 PM

## 2014-05-17 NOTE — Progress Notes (Signed)
Physician stated that if patients blood sugar was stable after lunch he could be discharged. Patient stated that if he were not discharged now he would be signing out Pedro [name]. Blood sugar at this time 158. Dr. Roderic Palau paged to notify.

## 2014-06-05 ENCOUNTER — Ambulatory Visit: Payer: Medicaid Other | Admitting: Nurse Practitioner

## 2014-06-17 ENCOUNTER — Ambulatory Visit (HOSPITAL_COMMUNITY)
Admission: RE | Admit: 2014-06-17 | Discharge: 2014-06-17 | Disposition: A | Discharge status: Home / Self Care | Payer: Medicaid Other | Attending: Psychiatry | Admitting: Psychiatry
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
## 2014-08-16 ENCOUNTER — Other Ambulatory Visit: Payer: Self-pay | Admitting: Family Medicine

## 2014-08-16 DIAGNOSIS — E1011 Type 1 diabetes mellitus with ketoacidosis with coma: Secondary | ICD-10-CM

## 2014-08-16 MED ORDER — INSULIN ASPART 100 UNIT/ML FLEXPEN: 5.0000 [IU] | PEN_INJECTOR | Freq: Three times a day (TID) | SUBCUTANEOUS | Status: DC

## 2014-08-16 MED ORDER — INSULIN GLARGINE 100 UNIT/ML SOLOSTAR PEN: PEN_INJECTOR | SUBCUTANEOUS | Status: DC

## 2014-09-09 ENCOUNTER — Ambulatory Visit (HOSPITAL_COMMUNITY)
Admission: EM | Admit: 2014-09-09 | Discharge: 2014-09-09 | Disposition: A | Discharge status: Home / Self Care | Payer: Medicaid Other | Source: Intra-hospital | Attending: Psychiatry | Admitting: Psychiatry

## 2014-09-09 DIAGNOSIS — F909 Attention-deficit hyperactivity disorder, unspecified type: Secondary | ICD-10-CM | POA: Insufficient documentation

## 2014-09-09 DIAGNOSIS — E108 Type 1 diabetes mellitus with unspecified complications: Secondary | ICD-10-CM | POA: Insufficient documentation

## 2014-09-09 DIAGNOSIS — F1420 Cocaine dependence, uncomplicated: Secondary | ICD-10-CM | POA: Insufficient documentation
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
## 2014-09-15 ENCOUNTER — Other Ambulatory Visit: Payer: Self-pay | Admitting: Family Medicine

## 2014-10-10 ENCOUNTER — Ambulatory Visit: Payer: Self-pay | Admitting: Family Medicine

## 2014-10-16 ENCOUNTER — Encounter: Payer: Self-pay | Admitting: Nurse Practitioner

## 2014-10-17 ENCOUNTER — Inpatient Hospital Stay (HOSPITAL_COMMUNITY): Payer: Self-pay

## 2014-10-17 ENCOUNTER — Encounter (HOSPITAL_COMMUNITY): Payer: Self-pay | Admitting: Emergency Medicine

## 2014-10-17 ENCOUNTER — Inpatient Hospital Stay (HOSPITAL_COMMUNITY)
Admission: EM | Admit: 2014-10-17 | Discharge: 2014-10-18 | DRG: 638 | Discharge status: Against Medical Advice | Payer: Self-pay | Attending: Internal Medicine | Admitting: Internal Medicine

## 2014-10-17 DIAGNOSIS — E876 Hypokalemia: Secondary | ICD-10-CM | POA: Diagnosis present

## 2014-10-17 DIAGNOSIS — N179 Acute kidney failure, unspecified: Secondary | ICD-10-CM | POA: Diagnosis present

## 2014-10-17 DIAGNOSIS — Z91199 Patient's noncompliance with other medical treatment and regimen due to unspecified reason: Secondary | ICD-10-CM

## 2014-10-17 DIAGNOSIS — Z794 Long term (current) use of insulin: Secondary | ICD-10-CM

## 2014-10-17 DIAGNOSIS — Z833 Family history of diabetes mellitus: Secondary | ICD-10-CM

## 2014-10-17 DIAGNOSIS — Z681 Body mass index (BMI) 19 or less, adult: Secondary | ICD-10-CM

## 2014-10-17 DIAGNOSIS — R000 Tachycardia, unspecified: Secondary | ICD-10-CM

## 2014-10-17 DIAGNOSIS — E860 Dehydration: Secondary | ICD-10-CM

## 2014-10-17 DIAGNOSIS — E1010 Type 1 diabetes mellitus with ketoacidosis without coma: Principal | ICD-10-CM

## 2014-10-17 DIAGNOSIS — F17210 Nicotine dependence, cigarettes, uncomplicated: Secondary | ICD-10-CM | POA: Diagnosis present

## 2014-10-17 DIAGNOSIS — D72829 Elevated white blood cell count, unspecified: Secondary | ICD-10-CM

## 2014-10-17 DIAGNOSIS — Z720 Tobacco use: Secondary | ICD-10-CM | POA: Diagnosis present

## 2014-10-17 DIAGNOSIS — F909 Attention-deficit hyperactivity disorder, unspecified type: Secondary | ICD-10-CM | POA: Diagnosis present

## 2014-10-17 DIAGNOSIS — E1110 Type 2 diabetes mellitus with ketoacidosis without coma: Secondary | ICD-10-CM | POA: Diagnosis present

## 2014-10-17 DIAGNOSIS — R636 Underweight: Secondary | ICD-10-CM | POA: Diagnosis present

## 2014-10-17 DIAGNOSIS — Z9119 Patient's noncompliance with other medical treatment and regimen: Secondary | ICD-10-CM

## 2014-10-17 HISTORY — DX: Acute kidney failure, unspecified: N17.9

## 2014-10-17 LAB — GLUCOSE, CAPILLARY
GLUCOSE-CAPILLARY: 131 mg/dL — AB (ref 65–99)
GLUCOSE-CAPILLARY: 192 mg/dL — AB (ref 65–99)
GLUCOSE-CAPILLARY: 219 mg/dL — AB (ref 65–99)
Glucose-Capillary: 249 mg/dL — ABNORMAL HIGH (ref 65–99)
Glucose-Capillary: 214 mg/dL — ABNORMAL HIGH (ref 65–99)
Glucose-Capillary: 174 mg/dL — ABNORMAL HIGH (ref 65–99)
Glucose-Capillary: 165 mg/dL — ABNORMAL HIGH (ref 65–99)
Glucose-Capillary: 218 mg/dL — ABNORMAL HIGH (ref 65–99)
Glucose-Capillary: 235 mg/dL — ABNORMAL HIGH (ref 65–99)
Glucose-Capillary: 189 mg/dL — ABNORMAL HIGH (ref 65–99)
Glucose-Capillary: 133 mg/dL — ABNORMAL HIGH (ref 65–99)
Glucose-Capillary: 151 mg/dL — ABNORMAL HIGH (ref 65–99)

## 2014-10-17 LAB — CBC WITH DIFFERENTIAL/PLATELET
Basophils Absolute: 0 10*3/uL (ref 0.0–0.1)
Basophils Relative: 0 % (ref 0–1)
EOS PCT: 0 % (ref 0–5)
Eosinophils Absolute: 0 10*3/uL (ref 0.0–0.7)
HEMATOCRIT: 56.4 % — AB (ref 39.0–52.0)
HEMOGLOBIN: 19.6 g/dL — AB (ref 13.0–17.0)
LYMPHS ABS: 3.6 10*3/uL (ref 0.7–4.0)
Lymphocytes Relative: 20 % (ref 12–46)
MCH: 31.7 pg (ref 26.0–34.0)
MCHC: 34.8 g/dL (ref 30.0–36.0)
MCV: 91.3 fL (ref 78.0–100.0)
MONO ABS: 0.7 10*3/uL (ref 0.1–1.0)
Monocytes Relative: 4 % (ref 3–12)
Neutro Abs: 13.7 10*3/uL — ABNORMAL HIGH (ref 1.7–7.7)
Neutrophils Relative %: 76 % (ref 43–77)
Platelets: 294 10*3/uL (ref 150–400)
RBC: 6.18 MIL/uL — ABNORMAL HIGH (ref 4.22–5.81)
RDW: 13.1 % (ref 11.5–15.5)
WBC: 18 10*3/uL — AB (ref 4.0–10.5)

## 2014-10-17 LAB — URINE MICROSCOPIC-ADD ON

## 2014-10-17 LAB — BASIC METABOLIC PANEL
Anion gap: 12 (ref 5–15)
Anion gap: 10 (ref 5–15)
Anion gap: 10 (ref 5–15)
BUN: 18 mg/dL (ref 6–20)
BUN: 18 mg/dL (ref 6–20)
BUN: 18 mg/dL (ref 6–20)
BUN: 17 mg/dL (ref 6–20)
CHLORIDE: 97 mmol/L — AB (ref 101–111)
CHLORIDE: 111 mmol/L (ref 101–111)
CHLORIDE: 108 mmol/L (ref 101–111)
CO2: 9 mmol/L — AB (ref 22–32)
CO2: 12 mmol/L — AB (ref 22–32)
CO2: 15 mmol/L — ABNORMAL LOW (ref 22–32)
CO2: 16 mmol/L — ABNORMAL LOW (ref 22–32)
CREATININE: 0.87 mg/dL (ref 0.61–1.24)
Calcium: 10.4 mg/dL — ABNORMAL HIGH (ref 8.9–10.3)
Calcium: 9.4 mg/dL (ref 8.9–10.3)
Calcium: 9.3 mg/dL (ref 8.9–10.3)
Calcium: 9 mg/dL (ref 8.9–10.3)
Chloride: 109 mmol/L (ref 101–111)
Creatinine, Ser: 1.42 mg/dL — ABNORMAL HIGH (ref 0.61–1.24)
Creatinine, Ser: 1.02 mg/dL (ref 0.61–1.24)
Creatinine, Ser: 0.79 mg/dL (ref 0.61–1.24)
GFR calc Af Amer: >60 mL/min (ref >60)
GFR calc Af Amer: >60 mL/min (ref >60)
GFR calc Af Amer: >60 mL/min (ref >60)
GFR calc non Af Amer: >60 mL/min (ref >60)
GFR calc non Af Amer: >60 mL/min (ref >60)
GLUCOSE: 546 mg/dL — AB (ref 65–99)
GLUCOSE: 195 mg/dL — AB (ref 65–99)
Glucose, Bld: 171 mg/dL — ABNORMAL HIGH (ref 65–99)
Glucose, Bld: 144 mg/dL — ABNORMAL HIGH (ref 65–99)
POTASSIUM: 4.6 mmol/L (ref 3.5–5.1)
POTASSIUM: 4.5 mmol/L (ref 3.5–5.1)
Potassium: 3.7 mmol/L (ref 3.5–5.1)
Potassium: 3.5 mmol/L (ref 3.5–5.1)
SODIUM: 135 mmol/L (ref 135–145)
SODIUM: 135 mmol/L (ref 135–145)
Sodium: 134 mmol/L — ABNORMAL LOW (ref 135–145)
Sodium: 134 mmol/L — ABNORMAL LOW (ref 135–145)

## 2014-10-17 LAB — RAPID URINE DRUG SCREEN, HOSP PERFORMED
Amphetamines: NOT DETECTED
Barbiturates: NOT DETECTED
Benzodiazepines: NOT DETECTED
COCAINE: NOT DETECTED
OPIATES: NOT DETECTED
Tetrahydrocannabinol: NOT DETECTED

## 2014-10-17 LAB — CBG MONITORING, ED
GLUCOSE-CAPILLARY: 356 mg/dL — AB (ref 65–99)
Glucose-Capillary: 558 mg/dL (ref 65–99)
Glucose-Capillary: 490 mg/dL — ABNORMAL HIGH (ref 65–99)

## 2014-10-17 LAB — URINALYSIS, ROUTINE W REFLEX MICROSCOPIC
Bilirubin Urine: NEGATIVE
Glucose, UA: 500 mg/dL — AB
Ketones, ur: >80 mg/dL — AB
LEUKOCYTES UA: NEGATIVE
NITRITE: NEGATIVE
Protein, ur: 30 mg/dL — AB
SPECIFIC GRAVITY, URINE: 1.025 (ref 1.005–1.030)
Urobilinogen, UA: 0.2 mg/dL (ref 0.0–1.0)
pH: 5.5 (ref 5.0–8.0)

## 2014-10-17 LAB — LIPASE, BLOOD

## 2014-10-17 MED ORDER — ACETAMINOPHEN 325 MG PO TABS
650.0000 mg | ORAL_TABLET | Freq: Four times a day (QID) | ORAL | Status: DC | PRN
  Administered 2014-10-17: 650 mg via ORAL
  Filled 2014-10-17: qty 2

## 2014-10-17 MED ORDER — SODIUM CHLORIDE 0.9 % IV SOLN
INTRAVENOUS | Status: DC
  Administered 2014-10-17: 5 [IU]/h via INTRAVENOUS
  Filled 2014-10-17: qty 2.5

## 2014-10-17 MED ORDER — SODIUM CHLORIDE 0.9 % IV SOLN
1000.0000 mL | INTRAVENOUS | Status: DC
  Administered 2014-10-17: 1000 mL via INTRAVENOUS

## 2014-10-17 MED ORDER — ONDANSETRON HCL 4 MG/2ML IJ SOLN
4.0000 mg | Freq: Once | INTRAMUSCULAR | Status: AC
  Administered 2014-10-17: 4 mg via INTRAVENOUS
  Filled 2014-10-17: qty 2

## 2014-10-17 MED ORDER — SODIUM CHLORIDE 0.9 % IV SOLN
1000.0000 mL | Freq: Once | INTRAVENOUS | Status: AC
  Administered 2014-10-17: 1000 mL via INTRAVENOUS

## 2014-10-17 MED ORDER — SODIUM CHLORIDE 0.9 % IV SOLN
INTRAVENOUS | Status: DC
Start: 2014-10-17 — End: 2014-10-17

## 2014-10-17 MED ORDER — IBUPROFEN 800 MG PO TABS
800.0000 mg | ORAL_TABLET | Freq: Once | ORAL | Status: AC
  Administered 2014-10-17: 800 mg via ORAL
  Filled 2014-10-17: qty 1

## 2014-10-17 MED ORDER — SODIUM CHLORIDE 0.9 % IV SOLN
INTRAVENOUS | Status: DC
  Administered 2014-10-17: 11:00:00 via INTRAVENOUS

## 2014-10-17 MED ORDER — SODIUM CHLORIDE 0.9 % IV SOLN
INTRAVENOUS | Status: DC
  Filled 2014-10-17: qty 2.5

## 2014-10-17 MED ORDER — DEXTROSE-NACL 5-0.45 % IV SOLN: INTRAVENOUS | Status: DC

## 2014-10-17 MED ORDER — DEXTROSE-NACL 5-0.45 % IV SOLN
INTRAVENOUS | Status: DC
  Administered 2014-10-17 – 2014-10-18 (×2): via INTRAVENOUS

## 2014-10-17 MED ORDER — ONDANSETRON HCL 4 MG/2ML IJ SOLN: 4.0000 mg | Freq: Once | INTRAMUSCULAR | Status: AC

## 2014-10-17 MED ORDER — GLUCERNA SHAKE PO LIQD
237.0000 mL | Freq: Three times a day (TID) | ORAL | Status: DC
  Administered 2014-10-17: 237 mL via ORAL

## 2014-10-17 MED ORDER — POTASSIUM CHLORIDE 10 MEQ/100ML IV SOLN
10.0000 meq | INTRAVENOUS | Status: AC
  Administered 2014-10-17 (×2): 10 meq via INTRAVENOUS
  Filled 2014-10-17 (×2): qty 100

## 2014-10-17 MED ORDER — HEPARIN SODIUM (PORCINE) 5000 UNIT/ML IJ SOLN
5000.0000 [IU] | Freq: Three times a day (TID) | INTRAMUSCULAR | Status: DC
  Filled 2014-10-17: qty 1

## 2014-10-17 NOTE — Progress Notes (Signed)
Initial Nutrition Assessment  DOCUMENTATION CODES:    Underweight  INTERVENTION:  Glucerna Shake po TID, each supplement provides 220 kcal and 10 grams of protein    NUTRITION DIAGNOSIS:   Unplanned wt loss related to DKA, non-compliance with dz management  as evidence by 11% wt loss (5 months) and pt hx   GOAL:  Consistent carbohydrate intake, improved glucose control as well as prevent further weight loss    MONITOR:  Po intake, labs and wt trends     REASON FOR ASSESSMENT:   Malnutrition Screening Tool    ASSESSMENT:  Pt says he's starting to feel better now. Nausea and vomiting have improved and his diet has advanced.   He has had several admissions associated with his diabetes. Hx of non-compliance.   Significant unplanned weight loss 11%. Unable to complete physical exam at this time.   Diet Order:  Diet Carb Modified Fluid consistency:: Thin; Room service appropriate?: Yes  Skin:   WDL  Last BM:   8/12  Height:   Ht Readings from Last 1 Encounters:  10/17/14 5\' 11"  (1.803 m)    Weight:   Wt Readings from Last 1 Encounters:  10/17/14 125 lb 3.5 oz (56.8 kg)    Ideal Body Weight:  78.1 kg  BMI:  Body mass index is 17.47 kg/(m^2).  Estimated Nutritional Needs:   Kcal:  1700-2000   Protein:  80-90 gr  Fluid:  2000 ml daily  EDUCATION NEEDS: none at this time     Colman Cater MS,RD,CSG,LDN Office: #[phone] Pager: [phone]

## 2014-10-17 NOTE — H&P (Signed)
[facility] History and Physical  AVIAN KONIGSBERG DGU:[id number] DOB: 1996/01/23 DOA: 10/17/2014  Referring physician:  PCP: Chevis Pretty, FNP   Chief Complaint: n/v  HPI: Pedro [name] is a 19 y.o. male PMH including diabetes type 1, ADHD, herpes, noncompliance with medication presents to the emergency department with the chief complaint persistent nausea and vomiting. Initial evaluation in the emergency department reveals diabetic ketoacidosis with an an ion gap of 29, acute renal failure with a creatinine of 1.4 and leukocytosis. Patient is well-known to hospitalists service as he has multiple admissions for the same. He reports "they cut off my medicade so I have to use that 7030 stuff". He denies missing any doses. He reports he last checked his blood sugar yesterday and it was in the 200s. Yesterday he developed intermittent abdominal pain with persistent nausea and vomiting. He denies any coffee ground emesis. He reports small amount bile-like emesis. He denies fever chills diarrhea. Associated symptoms include headache and generalized weakness. He denies chest pain palpitation shortness of breath cough. He denies dysuria hematuria frequency or urgency. Workup in the emergency department reveals WBC 18, hemoglobin 19.6 chloride 97 CO2 9 creatinine 1.4 calcium 10.4 glucose 546. Urinalysis with greater than 80 ketones glucose of 500. He is afebrile hemodynamically stable tachycardic and not hypoxic. In the emergency department he is given 1 L of normal saline insulin drip is initiated.  Review of Systems:  10 point review of systems complete and all systems are negative except as indicated in the history of present illness   Past Medical History  Diagnosis Date  . ADHD (attention deficit hyperactivity disorder)   . DM type 1 (diabetes mellitus, type 1)     diagnosed age 60  . Herpes   . Noncompliance with medications    Past Surgical History  Procedure Laterality  Date  . None     Social History:  reports that he has been smoking Cigarettes.  He has a 1 pack-year smoking history. He uses smokeless tobacco. He reports that he drinks alcohol. He reports that he uses illicit drugs (Marijuana, Other-see comments, Benzodiazepines, Hydrocodone, and Oxycodone).  Allergies  Allergen Reactions  . Sulfa Antibiotics Hives    fever    Family History  Problem Relation Age of Onset  . Diabetes Mellitus I Paternal Grandmother   . Diabetes Mellitus I Paternal Aunt     Prior to Admission medications   Medication Sig Start Date End Date Taking? Authorizing Provider  insulin aspart protamine- aspart (NOVOLOG MIX 70/30) (70-30) 100 UNIT/ML injection Inject 25 Units into the skin daily.   Yes Historical Provider, MD  valACYclovir (VALTREX) 1000 MG tablet Take 1 tablet (1,000 mg total) by mouth 2 (two) times daily. 04/16/14  Yes Lysbeth Penner, FNP  gabapentin (NEURONTIN) 400 MG capsule Take 1 cap AM , 1 cap at  [facility] and 2 cap at bedtime Patient taking differently: Take 400-800 mg by mouth 3 (three) times daily. Take 1 capsule in the morning, 1 capsule at  [facility] and 2 capsules at bedtime. 04/23/14   Chipper Herb, MD  Insulin Pen Needle 31G X 6 MM MISC To check BG 3x daily. Dx E10.10 04/08/14   Lysbeth Penner, FNP   Physical Exam: Filed Vitals:   10/17/14 0800 10/17/14 0830 10/17/14 0900 10/17/14 0930  BP: 151/95 157/100 140/89 124/80  Pulse: 119 123 123 111  Temp:      Resp: 30 20 23 16   Height:  Weight:      SpO2: 100% 100% 100% 100%    Wt Readings from Last 3 Encounters:  10/17/14 63.504 kg (140 lb)  05/17/14 64.4 kg (141 lb 15.6 oz)  05/01/14 63.5 kg (139 lb 15.9 oz)    General:  Appears calm and comfortable Eyes: PERRL, normal lids, irises & conjunctiva ENT: grossly normal hearing, mucous membranes of his mouth are pink but dry Neck: no LAD, masses or thyromegaly Cardiovascular: RRR, no m/r/g. No LE edema. Respiratory: CTA bilaterally, no  w/r/r. Normal respiratory effort. Abdomen: soft, ntnd Skin: no rash or induration seen on limited exam Musculoskeletal: grossly normal tone BUE/BLE Psychiatric: grossly normal mood and affect, speech fluent and appropriate Neurologic: grossly non-focal.          Labs on Admission:  Basic Metabolic Panel:  Recent Labs Lab 10/17/14 0710  NA 135  K 4.6  CL 97*  CO2 9*  GLUCOSE 546*  BUN 18  CREATININE 1.42*  CALCIUM 10.4*   Liver Function Tests: No results for input(s): AST, ALT, ALKPHOS, BILITOT, PROT, ALBUMIN in the last 168 hours.  Recent Labs Lab 10/17/14 0710  LIPASE <10*   No results for input(s): AMMONIA in the last 168 hours. CBC:  Recent Labs Lab 10/17/14 0710  WBC 18.0*  NEUTROABS 13.7*  HGB 19.6*  HCT 56.4*  MCV 91.3  PLT 294   Cardiac Enzymes: No results for input(s): CKTOTAL, CKMB, CKMBINDEX, TROPONINI in the last 168 hours.  BNP (last 3 results) No results for input(s): BNP in the last 8760 hours.  ProBNP (last 3 results) No results for input(s): PROBNP in the last 8760 hours.  CBG:  Recent Labs Lab 10/17/14 0720 10/17/14 0908  GLUCAP 558* 490*    Radiological Exams on Admission: No results found.  EKG: Sinus tachycardia with biatrial enlargement prolonged QT  Assessment/Plan Principal Problem:   DKA (diabetic ketoacidoses): Likely related to noncompliance. An ion gap 29 urine ketones 80+. Will admit to step down will continue insulin drip using glucose stabilizer protocol. Will get be metastases every 4 hours. Will provide normal saline until glucose drops below 250 and then will switch to D5 half-normal saline. Will obtain a hemoglobin A1c. On admission patient is alert and oriented and not in any distress Active Problems:   Leukocytosis: Likely related to above. Urine without any signs of infection. He is a smoker so we'll get a chest x-ray for completeness. He is afebrile. Non-toxic appearing.  Will monitor closely.  Acute renal  failure: Secondary to dehydration related to #1. IV fluids as noted above. Will hold any nephrotoxic's. Monitor urine output. If no improvement will consider renal ultrasound.  Dehydration: Secondary to #1. IV fluids as noted above. Monitor on step down. See therapy for #1.    Noncompliance: History of same. Chart review indicates patient has not been to this hospital since March. He reports he's been going to the hospital in [place] indicating that his noncompliance continues    Tobacco abuse: Cessation counseling offered. Patient verbalizes no interest in stopping      Tachycardia: Related to #1 and #4. Improving at the time of admission after IV fluids. Will monitor on step down. No chest pain. EKG with sinus tach and prolonged QT  Diabetes. Type I. Will obtain a hemoglobin A1c. Patient with a long history of noncompliance. He reports his most recent regimen includes 7030 insulin daily.    Code Status: full DVT Prophylaxis: Family Communication: none present Disposition Plan: home when ready  Time spent: 60 minutes  [facility] Pager [phone]

## 2014-10-17 NOTE — ED Provider Notes (Signed)
CSN: [id number]     Arrival date & time 10/17/14  8938 History   None    Chief Complaint  Patient presents with  . Vomiting   HPI  [name] is a 19yo male presenting today with nausea and vomiting. States he vomited 10-15 times since last night. Unable to tolerate PO. History of type 1 DM and presents often with DKA. Currently prescribed 70-30 insulin and states he injects 25units each day. States he has not missed any doses of insulin. Last checked his blood sugar yesterday and it was in the 200s. Also complains of upper abdominal pain and chest pain. Denies fevers. Denies any other sick contacts.   Past Medical History  Diagnosis Date  . ADHD (attention deficit hyperactivity disorder)   . DM type 1 (diabetes mellitus, type 1)     diagnosed age 60  . Herpes   . Noncompliance with medications    Past Surgical History  Procedure Laterality Date  . None     Family History  Problem Relation Age of Onset  . Diabetes Mellitus I Paternal Grandmother   . Diabetes Mellitus I Paternal Aunt    Social History  Substance Use Topics  . Smoking status: Current Every Day Smoker -- 0.50 packs/day for 2 years    Types: Cigarettes  . Smokeless tobacco: Current User  . Alcohol Use: Yes     Comment: occassional- denies    Review of Systems  Constitutional: Negative for fever.  Cardiovascular: Positive for chest pain.  Gastrointestinal: Positive for nausea, vomiting and abdominal pain. Negative for diarrhea and constipation.      Allergies  Sulfa antibiotics  Home Medications   Prior to Admission medications   Medication Sig Start Date End Date Taking? Authorizing Provider  insulin aspart protamine- aspart (NOVOLOG MIX 70/30) (70-30) 100 UNIT/ML injection Inject 25 Units into the skin daily.   Yes Historical Provider, MD  valACYclovir (VALTREX) 1000 MG tablet Take 1 tablet (1,000 mg total) by mouth 2 (two) times daily. 04/16/14  Yes Lysbeth Penner, FNP  gabapentin (NEURONTIN) 400  MG capsule Take 1 cap AM , 1 cap at  [facility] and 2 cap at bedtime Patient taking differently: Take 400-800 mg by mouth 3 (three) times daily. Take 1 capsule in the morning, 1 capsule at  [facility] and 2 capsules at bedtime. 04/23/14   Chipper Herb, MD  insulin aspart (NOVOLOG FLEXPEN) 100 UNIT/ML FlexPen Inject 5-10 Units into the skin 3 (three) times daily with meals. Patient not taking: Reported on 10/17/2014 08/16/14   Claretta Fraise, MD  Insulin Glargine (LANTUS) 100 UNIT/ML Solostar Pen Inject 43 units + 1 units as needed to prime pen (total of 44 units) once daily. 08/16/14   Claretta Fraise, MD  Insulin Pen Needle 31G X 6 MM MISC To check BG 3x daily. Dx E10.10 04/08/14   Lysbeth Penner, FNP   BP 140/89 mmHg  Pulse 123  Temp(Src) 97.5 F (36.4 C)  Resp 23  Ht 5\' 11"  (1.803 m)  Wt 140 lb (63.504 kg)  BMI 19.53 kg/m2  SpO2 100% Physical Exam  Constitutional: He is oriented to person, place, and time. He appears well-developed and well-nourished. He appears distressed.  HENT:  Head: Normocephalic and atraumatic.  Eyes: Pupils are equal, round, and reactive to light. Right eye exhibits no discharge. Left eye exhibits no discharge.  Cardiovascular: Regular rhythm.  Exam reveals no gallop and no friction rub.   No murmur heard. Tachycardic  Pulmonary/Chest: Effort  normal. No respiratory distress. He has no wheezes. He has no rales.  Kussmaul. Tenderness over anterior chest  Abdominal: Soft. Bowel sounds are normal. He exhibits no distension. There is no tenderness.  Musculoskeletal: He exhibits no edema.  Neurological: He is alert and oriented to person, place, and time.  Skin: Skin is warm and dry. No rash noted.  Psychiatric: He has a normal mood and affect. His behavior is normal.    ED Course  Procedures (including critical care time) Labs Review Labs Reviewed  CBC WITH DIFFERENTIAL/PLATELET - Abnormal; Notable for the following:    WBC 18.0 (*)    RBC 6.18 (*)    Hemoglobin 19.6 (*)     HCT 56.4 (*)    Neutro Abs 13.7 (*)    All other components within normal limits  BASIC METABOLIC PANEL - Abnormal; Notable for the following:    Chloride 97 (*)    CO2 9 (*)    Glucose, Bld 546 (*)    Creatinine, Ser 1.42 (*)    Calcium 10.4 (*)    All other components within normal limits  LIPASE, BLOOD - Abnormal; Notable for the following:    Lipase <10 (*)    All other components within normal limits  URINALYSIS, ROUTINE W REFLEX MICROSCOPIC (NOT AT [facility]) - Abnormal; Notable for the following:    Glucose, UA 500 (*)    Hgb urine dipstick TRACE (*)    Ketones, ur >80 (*)    Protein, ur 30 (*)    All other components within normal limits  CBG MONITORING, ED - Abnormal; Notable for the following:    Glucose-Capillary 558 (*)    All other components within normal limits  CBG MONITORING, ED - Abnormal; Notable for the following:    Glucose-Capillary 490 (*)    All other components within normal limits  URINE RAPID DRUG SCREEN, HOSP PERFORMED  URINE MICROSCOPIC-ADD ON  BASIC METABOLIC PANEL  BASIC METABOLIC PANEL  BASIC METABOLIC PANEL  BASIC METABOLIC PANEL  BASIC METABOLIC PANEL  CBG MONITORING, ED    Imaging Review No results found. I, [facility], personally reviewed and evaluated these images and lab results as part of my medical decision-making.   EKG Interpretation   Date/Time:  Friday October 17 2014 07:49:36 EDT Ventricular Rate:  114 PR Interval:  131 QRS Duration: 106 QT Interval:  369 QTC Calculation: 508 R Axis:   106 Text Interpretation:  Sinus tachycardia Biatrial enlargement Consider  right ventricular hypertrophy Prolonged QT interval Baseline wander in  lead(s) V3 Sinus tachycardia QT prolonged Abnormal ekg Confirmed by  Carmin Muskrat  MD ([id number]) on 10/17/2014 7:52:22 AM      MDM   Final diagnoses:  Diabetic ketoacidosis without coma associated with type 1 diabetes mellitus  Presented with nausea/vomiting and abdominal/chest pain.  Will obtain EKG. Will also obtain state CBG, BMP, CBC, urine ketones, urinalysis, lipase. CBG noted to be 558. Initiated on DKA protocol: glucose stabilizer, 1L NS bolus, and NS@125 . Will trend CBG q1hr and BMP q3hr.  BMP with acute kidney injury (creatinine 1.42) and normal electrolytes (K 4.6). Anion Gap calculated to be 29. CBC with leukocytosis of 18 and hemoglobin 19.6. Urinalysis with glucose 500 and >80 ketones.  Will contact Internal Medicine for admission.   Repeat CBG at one hour 490.  Admission to SDU by [place], [place] 10/17/14 [place], MD 10/19/14 1743

## 2014-10-17 NOTE — ED Notes (Signed)
Gave pt ice chips with MD Vanita Panda approval.

## 2014-10-17 NOTE — Progress Notes (Signed)
Inpatient Diabetes Program Recommendations  AACE/ADA: New Consensus Statement on Inpatient Glycemic Control (2013)  Target Ranges:  Prepandial:   less than 140 mg/dL      Peak postprandial:   less than 180 mg/dL (1-2 hours)      Critically ill patients:  140 - 180 mg/dL   Results for Pedro [name], Pedro [name] (MRN [id number]) as of 10/17/2014 11:30  Ref. Range 10/17/2014 07:10  Sodium Latest Ref Range: 135-145 mmol/L 135  Potassium Latest Ref Range: 3.5-5.1 mmol/L 4.6  Chloride Latest Ref Range: 101-111 mmol/L 97 (L)  CO2 Latest Ref Range: 22-32 mmol/L 9 (L)  BUN Latest Ref Range: 6-20 mg/dL 18  Creatinine Latest Ref Range: 0.61-1.24 mg/dL 1.42 (H)  Calcium Latest Ref Range: 8.9-10.3 mg/dL 10.4 (H)  EGFR (Non-African Amer.) Latest Ref Range: >60 mL/min >60  EGFR (African American) Latest Ref Range: >60 mL/min >60  Glucose Latest Ref Range: 65-99 mg/dL 546 (H)  Anion gap Latest Ref Range: 5-15  NOT CALCULATED   Admit with: DKA  History: Type 1 DM  Home DM Meds: 70/30 insulin- 25 units daily         Lantus 44 units daily         Novolog 5-10 units tid per SSI  Current DM Orders: IV Insulin drip started at 8AM per DKA Protocol     -Note patient stated in the H&P that he was now taking 70/30 insulin b/c "they cut off my Medicaid so I have to use that 70/30 stuff".  -Per Chart Review, saw that patient was supposed to be taking Lantus 44 units daily + Novolog 5-10 units tid per SSI from his last discharge summary from Dr. Roderic Palau on 05/17/14.  -Not sure who switched patient to 70/30 insulin once daily or when he was switched?  -Note current A1c pending.    MD-If patient truly has Type 1 DM, one injection of 70/30 insulin per day will not be enough.  Patient needs 70/30 insulin bid with meals at home.  When patient is ready to transition off the IV insulin drip, please initiate 70/30 insulin bidwc.  Would start with 70/30 insulin- 18 units bidwc    [this dose is based off weight  calculation- pt weight is 57 kg.  57 kg X 0.8 units/kg total daily dosing= 46 units total insulin per day.    50% of total daily dose as basal insulin would be ~23 units.  70/30 insulin- 18 units bid would provide patient ~25 units long-acting insulin along with 5 units Novolog with breakfast and supper]     Will follow Wyn Quaker RN, MSN, CDE Diabetes Coordinator Inpatient Glycemic Control Team Team Pager: [phone] (8a-5p)

## 2014-10-17 NOTE — ED Notes (Signed)
Hospitalist at bedside 

## 2014-10-17 NOTE — ED Notes (Signed)
Vomiting since last night.  Has vomited about 15-20 times since last night.  Have not been able to eat or drink anything.

## 2014-10-17 NOTE — Discharge Instructions (Signed)
Blood Glucose Monitoring °Monitoring your blood glucose (also know as blood sugar) helps you to manage your diabetes. It also helps you and your health care provider monitor your diabetes and determine how well your treatment plan is working. °WHY SHOULD YOU MONITOR YOUR BLOOD GLUCOSE? °· It can help you understand how food, exercise, and medicine affect your blood glucose. °· It allows you to know what your blood glucose is at any given moment. You can quickly tell if you are having low blood glucose (hypoglycemia) or high blood glucose (hyperglycemia). °· It can help you and your health care provider know how to adjust your medicines. °· It can help you understand how to manage an illness or adjust medicine for exercise. °WHEN SHOULD YOU TEST? °Your health care provider will help you decide how often you should check your blood glucose. This may depend on the type of diabetes you have, your diabetes control, or the types of medicines you are taking. Be sure to write down all of your blood glucose readings so that this information can be reviewed with your health care provider. See below for examples of testing times that your health care provider may suggest. °Type 1 Diabetes °· Test 4 times a day if you are in good control, using an insulin pump, or perform multiple daily injections. °· If your diabetes is not well controlled or if you are sick, you may need to monitor more often. °· It is a good idea to also monitor: °· Before and after exercise. °· Between meals and 2 hours after a meal. °· Occasionally between 2:00 a.m. and 3:00 a.m. °Type 2 Diabetes °· It can vary with each person, but generally, if you are on insulin, test 4 times a day. °· If you take medicines by mouth (orally), test 2 times a day. °· If you are on a controlled diet, test once a day. °· If your diabetes is not well controlled or if you are sick, you may need to monitor more often. °HOW TO MONITOR YOUR BLOOD GLUCOSE °Supplies  Needed °· Blood glucose meter. °· Test strips for your meter. Each meter has its own strips. You must use the strips that go with your own meter. °· A pricking needle (lancet). °· A device that holds the lancet (lancing device). °· A journal or log book to write down your results. °Procedure °· Wash your hands with soap and water. Alcohol is not preferred. °· Prick the side of your finger (not the tip) with the lancet. °· Gently milk the finger until a small drop of blood appears. °· Follow the instructions that come with your meter for inserting the test strip, applying blood to the strip, and using your blood glucose meter. °Other Areas to Get Blood for Testing °Some meters allow you to use other areas of your body (other than your finger) to test your blood. These areas are called alternative sites. The most common alternative sites are: °· The forearm. °· The thigh. °· The back area of the lower leg. °· The palm of the hand. °The blood flow in these areas is slower. Therefore, the blood glucose values you get may be delayed, and the numbers are different from what you would get from your fingers. Do not use alternative sites if you think you are having hypoglycemia. Your reading will not be accurate. Always use a finger if you are having hypoglycemia. Also, if you cannot feel your lows (hypoglycemia unawareness), always use your fingers for your   blood glucose checks. ADDITIONAL TIPS FOR GLUCOSE MONITORING  Do not reuse lancets.  Always carry your supplies with you.  All blood glucose meters have a 24-hour "hotline" number to call if you have questions or need help.  Adjust (calibrate) your blood glucose meter with a control solution after finishing a few boxes of strips. BLOOD GLUCOSE RECORD KEEPING It is a good idea to keep a daily record or log of your blood glucose readings. Most glucose meters, if not all, keep your glucose records stored in the meter. Some meters come with the ability to download  your records to your home computer. Keeping a record of your blood glucose readings is especially helpful if you are wanting to look for patterns. Make notes to go along with the blood glucose readings because you might forget what happened at that exact time. Keeping good records helps you and your health care provider to work together to achieve good diabetes management.  Document Released: 02/24/2003 Document Revised: 07/08/2013 Document Reviewed: 07/16/2012 [facility] Patient Information 2015 [place], [place]. This information is not intended to replace advice given to you by your health care provider. Make sure you discuss any questions you have with your health care provider.  Diabetes and Sick Day Management Blood sugar (glucose) can be more difficult to control when you are sick. Colds, fever, flu, nausea, vomiting, and diarrhea are all examples of common illnesses that can cause problems for people with diabetes. Loss of body fluids (dehydration) from fever, vomiting, diarrhea, infection, and the stress of a sickness can all cause blood glucose levels to increase. Because of this, it is very important to take your diabetes medicines and to eat some form of carbohydrate food when you are sick. Liquid or soft foods are often tolerated, and they help to replace fluids. HOME CARE INSTRUCTIONS These main guidelines are intended for managing a short-term (24 hours or less) sickness:  Take your usual dose of insulin or oral diabetes medicine. An exception would be if you take any form of metformin. If you cannot eat or drink, you can become dehydrated and should not take this medicine.  Continue to take your insulin even if you are unable to eat solid foods or are vomiting. Your insulin dose may stay the same, or it may need to be increased when you are sick.  You will need to test your blood glucose more often, generally every 2-4 hours. If you have type 1 diabetes, test your urine for ketones every 4  hours. If you have type 2 diabetes, test your urine for ketones as directed by your health care provider.  Eat some form of food that contains carbohydrates. The carbohydrates can be in solid or liquid form. You should eat 45-50 g of carbohydrates every 3-4 hours.  Replace fluids if you have a fever, vomit, or have diarrhea. Ask your health care provider for specific rehydration instructions.  Watch carefully for the signs of ketoacidosis if you have type 1 diabetes. Call your health care provider if any of the following symptoms are present, especially in children:  Moderate to large ketones in the urine along with a high blood glucose level.  Severe nausea.  Vomiting.  Diarrhea.  Abdominal pain.  Rapid breathing.  Drink extra liquids that do not contain sugar such as water.  Be careful with over-the-counter medicines. Read the labels. They may contain sugar or types of sugars that can increase your blood glucose level. Food Choices for Illness All of the food  choices below contain about 15 g of carbohydrates. Plan ahead and keep some of these foods around.    to  cup carbonated beverage containing sugar. Carbonated beverages will usually be better tolerated if they are opened and left at room temperature for a few minutes.   of a twin frozen ice pop.   cup regular gelatin.   cup juice.   cup ice cream or frozen yogurt.   cup cooked cereal.   cup sherbet.  1 cup clear broth or soup.  1 cup cream soup.   cup regular custard.   cup regular pudding.  1 cup sports drink.  1 cup plain yogurt.  1 slice toast.  6 squares saltine crackers.  5 vanilla wafers. SEEK MEDICAL CARE IF:   You are unable to drink fluids, even small amounts.  You have nausea and vomiting for more than 6 hours.  You have diarrhea for more than 6 hours.  Your blood glucose level is more than 240 mg/dL, even with additional insulin.  There is a change in mental status.  You  develop an additional serious sickness.  You have been sick for 2 days and are not getting better.  You have a fever. SEEK IMMEDIATE MEDICAL CARE IF:  You have difficulty breathing.  You have moderate to large ketone levels. MAKE SURE YOU:  Understand these instructions.  Will watch your condition.  Will get help right away if you are not doing well or get worse. Document Released: 02/24/2003 Document Revised: 07/08/2013 Document Reviewed: 07/31/2012 [facility] Patient Information 2015 [place], [place]. This information is not intended to replace advice given to you by your health care provider. Make sure you discuss any questions you have with your health care provider.

## 2014-10-18 DIAGNOSIS — N179 Acute kidney failure, unspecified: Secondary | ICD-10-CM

## 2014-10-18 DIAGNOSIS — D72829 Elevated white blood cell count, unspecified: Secondary | ICD-10-CM

## 2014-10-18 LAB — GLUCOSE, CAPILLARY
GLUCOSE-CAPILLARY: 107 mg/dL — AB (ref 65–99)
GLUCOSE-CAPILLARY: 149 mg/dL — AB (ref 65–99)
GLUCOSE-CAPILLARY: 179 mg/dL — AB (ref 65–99)
GLUCOSE-CAPILLARY: 202 mg/dL — AB (ref 65–99)
GLUCOSE-CAPILLARY: 241 mg/dL — AB (ref 65–99)
GLUCOSE-CAPILLARY: 268 mg/dL — AB (ref 65–99)
GLUCOSE-CAPILLARY: 356 mg/dL — AB (ref 65–99)
GLUCOSE-CAPILLARY: 372 mg/dL — AB (ref 65–99)
Glucose-Capillary: 140 mg/dL — ABNORMAL HIGH (ref 65–99)
Glucose-Capillary: 118 mg/dL — ABNORMAL HIGH (ref 65–99)

## 2014-10-18 LAB — BASIC METABOLIC PANEL
Anion gap: 12 (ref 5–15)
Anion gap: 7 (ref 5–15)
Anion gap: 8 (ref 5–15)
BUN: 17 mg/dL (ref 6–20)
BUN: 16 mg/dL (ref 6–20)
BUN: 17 mg/dL (ref 6–20)
CALCIUM: 8.8 mg/dL — AB (ref 8.9–10.3)
CALCIUM: 9 mg/dL (ref 8.9–10.3)
CHLORIDE: 103 mmol/L (ref 101–111)
CO2: 17 mmol/L — AB (ref 22–32)
CO2: 19 mmol/L — ABNORMAL LOW (ref 22–32)
CO2: 18 mmol/L — AB (ref 22–32)
CREATININE: 0.85 mg/dL (ref 0.61–1.24)
Calcium: 8.4 mg/dL — ABNORMAL LOW (ref 8.9–10.3)
Chloride: 109 mmol/L (ref 101–111)
Chloride: 104 mmol/L (ref 101–111)
Creatinine, Ser: 0.78 mg/dL (ref 0.61–1.24)
Creatinine, Ser: 0.64 mg/dL (ref 0.61–1.24)
GFR calc Af Amer: >60 mL/min (ref >60)
GFR calc Af Amer: >60 mL/min (ref >60)
GFR calc non Af Amer: >60 mL/min (ref >60)
GFR calc non Af Amer: >60 mL/min (ref >60)
GLUCOSE: 105 mg/dL — AB (ref 65–99)
GLUCOSE: 387 mg/dL — AB (ref 65–99)
Glucose, Bld: 233 mg/dL — ABNORMAL HIGH (ref 65–99)
POTASSIUM: 3.8 mmol/L (ref 3.5–5.1)
Potassium: 3.3 mmol/L — ABNORMAL LOW (ref 3.5–5.1)
Potassium: 3.3 mmol/L — ABNORMAL LOW (ref 3.5–5.1)
SODIUM: 132 mmol/L — AB (ref 135–145)
SODIUM: 130 mmol/L — AB (ref 135–145)
Sodium: 135 mmol/L (ref 135–145)

## 2014-10-18 LAB — MRSA PCR SCREENING: MRSA BY PCR: NEGATIVE

## 2014-10-18 LAB — HEMOGLOBIN A1C
Hgb A1c MFr Bld: 13.2 % — ABNORMAL HIGH (ref 4.8–5.6)
Mean Plasma Glucose: 332 mg/dL

## 2014-10-18 MED ORDER — POTASSIUM CHLORIDE CRYS ER 20 MEQ PO TBCR
40.0000 meq | EXTENDED_RELEASE_TABLET | Freq: Once | ORAL | Status: AC
  Administered 2014-10-18: 40 meq via ORAL
  Filled 2014-10-18: qty 2

## 2014-10-18 MED ORDER — INSULIN ASPART 100 UNIT/ML ~~LOC~~ SOLN
2.0000 [IU] | SUBCUTANEOUS | Status: DC
  Administered 2014-10-18: 2 [IU] via SUBCUTANEOUS

## 2014-10-18 MED ORDER — INSULIN ASPART 100 UNIT/ML ~~LOC~~ SOLN
0.0000 [IU] | Freq: Three times a day (TID) | SUBCUTANEOUS | Status: DC
Start: 2014-10-18 — End: 2014-10-18
  Administered 2014-10-18: 5 [IU] via SUBCUTANEOUS
  Administered 2014-10-18: 9 [IU] via SUBCUTANEOUS

## 2014-10-18 MED ORDER — SODIUM CHLORIDE 0.45 % IV SOLN
INTRAVENOUS | Status: DC
  Administered 2014-10-18 (×2): via INTRAVENOUS

## 2014-10-18 MED ORDER — INSULIN ASPART PROT & ASPART (70-30 MIX) 100 UNIT/ML ~~LOC~~ SUSP
15.0000 [IU] | Freq: Two times a day (BID) | SUBCUTANEOUS | Status: DC
  Administered 2014-10-18: 15 [IU] via SUBCUTANEOUS
  Filled 2014-10-18: qty 10

## 2014-10-18 MED ORDER — INSULIN ASPART 100 UNIT/ML ~~LOC~~ SOLN
5.0000 [IU] | Freq: Three times a day (TID) | SUBCUTANEOUS | Status: DC
  Administered 2014-10-18: 5 [IU] via SUBCUTANEOUS

## 2014-10-18 MED ORDER — INSULIN GLARGINE 100 UNIT/ML ~~LOC~~ SOLN
30.0000 [IU] | SUBCUTANEOUS | Status: DC
  Administered 2014-10-18: 30 [IU] via SUBCUTANEOUS
  Filled 2014-10-18 (×3): qty 0.3

## 2014-10-18 NOTE — Progress Notes (Signed)
eLink Physician-Brief Progress Note Patient Name: Pedro [name] DOB: [birth year] MRN: [id number]   Date of Service  10/18/2014  HPI/Events of Note  DKA- AG closed, now with NAG acidosis  eICU Interventions  Transition to SSI with 30 Ulantus & overlap Hypokalemia -repleted Change fluids to 1/2 NS     Intervention Category Major Interventions: Acid-Base disturbance - evaluation and management;Electrolyte abnormality - evaluation and management  ALVA,RAKESH V. 10/18/2014, 2:47 AM

## 2014-10-18 NOTE — Progress Notes (Signed)
[facility] PROGRESS NOTE  Pedro [name] [id number] DOB: [birth year] DOA: 10/17/2014 PCP: Chevis Pretty, FNP  Assessment/Plan: 1. DKA in diabetes type 1. Likely related to noncompliance. pt was started on insulin drip and IVF per DKA protocol. Once anion gap was closed, insulin drip was discontinued. hes been transitioned to subcutaneous insulin. He received a dose of lantus last night, will transition him to 70/30 insulin today. Monitor blood sugars. tolerating solid diet. He was recently transitioned to 70/30 from lantus and was likely under dosing. Continue to monitor blood sugars.  2. Leukocytosis. Likely related to above. Urine without any signs of infection. He is a smoker so we'll get a chest x-ray for completeness. CXR revealed no active disease. He is afebrile. Non-toxic appearing. Will monitor closely. 3. ARF. Secondary to dehydration related to #1. IV fluids as noted above. Will hold any nephrotoxic's. Monitor urine output.  4. Dehydration. Secondary to #1. IV fluids as noted above. improved with IVF. 5. Underweight. Glucerna Shake po TID, each supplement provides 220 kcal and 10 grams of protein per nutritional management  6. Tobacco abuse. Cessation counseling offered. Patient verbalizes no interest in stopping 7. Tachycardia. Related to #1 and #4. Resolved after IV fluids.   Code Status: Full DVT prophylaxis: Heparin  Family Communication: Patient was alone. No questions at this time Disposition Plan: home likely tomorrow  Consultants:    Procedures:    Antibiotics:    HPI/Subjective: No reports of SOB, n/v.  He reports he is feeling better overall  Objective: Filed Vitals:   10/18/14 0722  BP:   Pulse:   Temp: 97.8 F (36.6 C)  Resp:     Intake/Output Summary (Last 24 hours) at 10/18/14 0731 Last data filed at 10/18/14 0600  Gross per 24 hour  Intake 2831.8 ml  Output   2300 ml  Net  531.8 ml   Filed Weights   10/17/14 0704  10/17/14 1055  Weight: 63.504 kg (140 lb) 56.8 kg (125 lb 3.5 oz)    Exam:   General:  NAD, appears calm and comfortable, lying in bed  Cardiovascular: regular rate and rhythm, no murmurs rubs or gallops   Respiratory: Clear to ascultation, normal work of breathing, no w/r/r  Abdomen: soft, no TTP, no distension   Musculoskeletal: no edema   Data Reviewed: Basic Metabolic Panel:  Recent Labs Lab 10/17/14 1302 10/17/14 1647 10/17/14 2033 10/18/14 0106 10/18/14 0449  NA 135 134* 134* 132* 135  K 4.5 3.7 3.5 3.3* 3.3*  CL 111 109 108 103 109  CO2 12* 15* 16* 17* 19*  GLUCOSE 195* 171* 144* 233* 105*  BUN 18 18 17 17 16   CREATININE 1.02 0.87 0.79 0.78 0.64  CALCIUM 9.4 9.3 9.0 8.8* 9.0   Liver Function Tests: No results for input(s): AST, ALT, ALKPHOS, BILITOT, PROT, ALBUMIN in the last 168 hours.  Recent Labs Lab 10/17/14 0710  LIPASE <10*   No results for input(s): AMMONIA in the last 168 hours. CBC:  Recent Labs Lab 10/17/14 0710  WBC 18.0*  NEUTROABS 13.7*  HGB 19.6*  HCT 56.4*  MCV 91.3  PLT 294   Cardiac Enzymes: No results for input(s): CKTOTAL, CKMB, CKMBINDEX, TROPONINI in the last 168 hours. BNP (last 3 results) No results for input(s): BNP in the last 8760 hours.  ProBNP (last 3 results) No results for input(s): PROBNP in the last 8760 hours.  CBG:  Recent Labs Lab 10/18/14 0231 10/18/14 9678 10/18/14 0429 10/18/14 0520 10/18/14 9381  GLUCAP 179* 140* 118* 107* 149*    No results found for this or any previous visit (from the past 240 hour(s)).   Studies: Dg Chest Port 1 View  10/17/2014   CLINICAL DATA:  Vomiting and leukocytosis.  Mid chest pain.  EXAM: PORTABLE CHEST - 1 VIEW  COMPARISON:  05/16/2014  FINDINGS: The heart size and mediastinal contours are within normal limits. Both lungs are clear. The visualized skeletal structures are unremarkable.  IMPRESSION: No active disease.   Electronically Signed   By: [name] M.D.    On: 10/17/2014 10:52    Scheduled Meds: . feeding supplement (GLUCERNA SHAKE)  237 mL Oral TID BM  . heparin  5,000 Units Subcutaneous 3 times per day  . insulin aspart  2-6 Units Subcutaneous 6 times per day  . insulin glargine  30 Units Subcutaneous Q24H   Continuous Infusions: . sodium chloride 75 mL/hr at 10/18/14 0600  . insulin (NOVOLIN-R) infusion 1.2 mL/hr at 10/18/14 0434    Principal Problem:   DKA (diabetic ketoacidoses) Active Problems:   Leukocytosis   Noncompliance   Tobacco abuse   DKA, type 1   Dehydration   Tachycardia   Acute renal failure    Time spent: 25 minutes    Kathie Dike, M.D.  [facility] Pager [phone]. If 7PM-7AM, please contact night-coverage at [email or web address], password [facility] 10/18/2014, 7:31 AM  LOS: 1 day     I, Arielle Khosrowpour, acting a scribe, recorded this note contemporaneously in the presence of Dr. Kathie Dike, M.D. On 10/18/2014 at 7:31 AM   I have reviewed the above documentation for accuracy and completeness, and I agree with the above.  MEMON,JEHANZEB

## 2014-10-18 NOTE — Progress Notes (Addendum)
Pt's CBGs continue to climb since breakfast. Called diabetic coordinator on call and she recommended waiting on giving additional novolog now and to recheck CBG around dinner. She also recommended to discontinue meal coverage insulin as pt will be covered for meals when he receives 70/30 insulin, and to consider increasing dose of 70/30. Will notify MD of recommendations.

## 2014-10-18 NOTE — Progress Notes (Signed)
Inpatient Diabetes Program Recommendations  AACE/ADA: New Consensus Statement on Inpatient Glycemic Control (2013)  Target Ranges:  Prepandial:   less than 140 mg/dL      Peak postprandial:   less than 180 mg/dL (1-2 hours)      Critically ill patients:  140 - 180 mg/dL   Call received from RN regarding patient's blood sugars increasing after transition off insulin drip.  She states that patient received Lantus 30 units early this morning prior to transition off insulin drip and has been receiving correction Novolog since.  Novolog 5 units meal coverage added at lunch however blood sugar at 1420 was still 356 mg/dL.   Reviewed patient's chart remotely, and noted that 70/30 insulin is to be started at supper time.  Called RN back.  Note that since patient is now on 70/30 insulin he will not need Novolog meal coverage since meal coverage is included in 70/30.  Will likely need adjustment however agree with 70/30 15 units with supper since patient still has Lantus on board.  Instructed RN to check blood sugar at supper time and report to MD and also discuss possibly d/c of Novolog meal coverage.    Adah Perl, RN, BC-ADM Inpatient Diabetes Coordinator Pager [phone] (8a-5p)

## 2014-10-18 NOTE — Progress Notes (Signed)
Pt stated that he was "starting a new job tomorrow, and can't miss it". Called MD to see if pt could be discharged. Based on elevated blood sugars throughout day MD stated pt would need to stay inpatient for at least one more night to be monitored. Pt stated he "could not stay until tomorrow" and asked to sign out against medical advice. Pt was educated that the MD was not comfortable with pt leaving and was told about the risks and benefits of leaving against medical advice. He stated he understood the risks and still wanted to leave. He signed AMA papers and both IVs were removed. He ambulated outside by himself and refused staff assistance.

## 2014-10-20 NOTE — Discharge Summary (Signed)
Physician Discharge Summary  Pedro [name] [id number]:[id number] DOB: March 04, 1996 DOA: 10/17/2014  PCP: Pedro Pretty, FNP  Admit date: 10/17/2014 Discharge date: 10/18/2014  Time spent: 15 minutes  1. [place] ADVICE  Discharge Diagnoses:  Principal Problem:   DKA (diabetic ketoacidoses) Active Problems:   Leukocytosis   Noncompliance   Tobacco abuse   DKA, type 1   Dehydration   Tachycardia   Acute renal failure   Filed Weights   10/17/14 0704 10/17/14 1055  Weight: 63.504 kg (140 lb) 56.8 kg (125 lb 3.5 oz)    History of present illness and hospital course:  This is a 19 year old male with history of type 1 diabetes who presented to the emergency room with complaints of nausea, vomiting, abdominal pain. He was evaluated in the emergency room and found to be in diabetic ketoacidosis with dehydration and acute renal failure. He was admitted to the stepdown unit and started on intravenous insulin per DKA protocol as well as IV fluids. His anion gap improved as did his blood sugars. He reports that he recently lost his Medicaid and could no longer afford Lantus insulin. He reports being started on NovoLog 70/30 but was likely underdosed. He was transitioned from IV insulin to subcutaneous insulin. His blood sugars remain very labile during his hospital stay. It was recommended the patient to the hospital another 24 hour for further adjustment of his insulin to ensure that his blood sugars were stable prior to discharge. Patient refused in the hospital and left AGAINST MEDICAL ADVICE.   Discharge Exam: Filed Vitals:   10/18/14 1800  BP: 142/95  Pulse: 102  Temp:   Resp: 17     Allergies  Allergen Reactions  . Sulfa Antibiotics Hives    fever      The results of significant diagnostics from this hospitalization (including imaging, microbiology, ancillary and laboratory) are listed below for reference.    Significant Diagnostic  Studies: Dg Chest Port 1 View  10/17/2014   CLINICAL DATA:  Vomiting and leukocytosis.  Mid chest pain.  EXAM: PORTABLE CHEST - 1 VIEW  COMPARISON:  05/16/2014  FINDINGS: The heart size and mediastinal contours are within normal limits. Both lungs are clear. The visualized skeletal structures are unremarkable.  IMPRESSION: No active disease.   Electronically Signed   By: [name] M.D.   On: 10/17/2014 10:52    Microbiology: Recent Results (from the past 240 hour(s))  MRSA PCR Screening     Status: None   Collection Time: 10/17/14 11:05 AM  Result Value Ref Range Status   MRSA by PCR NEGATIVE NEGATIVE Final    Comment:        The GeneXpert MRSA Assay (FDA approved for NASAL specimens only), is one component of a comprehensive MRSA colonization surveillance program. It is not intended to diagnose MRSA infection nor to guide or monitor treatment for MRSA infections.      Labs: Basic Metabolic Panel:  Recent Labs Lab 10/17/14 1647 10/17/14 2033 10/18/14 0106 10/18/14 0449 10/18/14 0853  NA 134* 134* 132* 135 130*  K 3.7 3.5 3.3* 3.3* 3.8  CL 109 108 103 109 104  CO2 15* 16* 17* 19* 18*  GLUCOSE 171* 144* 233* 105* 387*  BUN 18 17 17 16 17   CREATININE 0.87 0.79 0.78 0.64 0.85  CALCIUM 9.3 9.0 8.8* 9.0 8.4*   Liver Function Tests: No results for input(s): AST, ALT, ALKPHOS, BILITOT, PROT, ALBUMIN in the last 168 hours.  Recent  Labs Lab 10/17/14 0710  LIPASE <10*   No results for input(s): AMMONIA in the last 168 hours. CBC:  Recent Labs Lab 10/17/14 0710  WBC 18.0*  NEUTROABS 13.7*  HGB 19.6*  HCT 56.4*  MCV 91.3  PLT 294   Cardiac Enzymes: No results for input(s): CKTOTAL, CKMB, CKMBINDEX, TROPONINI in the last 168 hours. BNP: BNP (last 3 results) No results for input(s): BNP in the last 8760 hours.  ProBNP (last 3 results) No results for input(s): PROBNP in the last 8760 hours.  CBG:  Recent Labs Lab 10/18/14 0520 10/18/14 0713  10/18/14 1112 10/18/14 1420 10/18/14 1638  GLUCAP 107* 149* 372* 356* 268*       Signed:  Samina [name]  [facility] 10/20/2014, 6:35 PM

## 2015-01-11 IMAGING — CT CT HEAD W/O CM
2 series · 15 of 30 positions shown, 19 images · non-contrast
Comparison: [facility] [HOSPITAL] Head CT without contrast
02/16/2009.

CLINICAL DATA: 18-year-old male with seizure at 10 a.m., now with
headache. No history of seizures. Initial encounter.

EXAM:
CT HEAD WITHOUT CONTRAST
TECHNIQUE: Contiguous axial images were obtained from the base of the skull
through the vertex without intravenous contrast.

[Series 201: head w/o, idose (1) · axial · non-contrast · 0.49mm/px · z∈[+121,+246]mm · 13 of 31 slices shown, 17 images]
[im 3/31  brain]
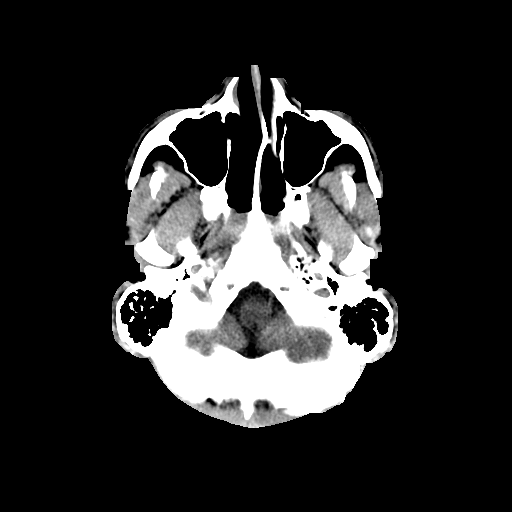
[im 3/31  bone]
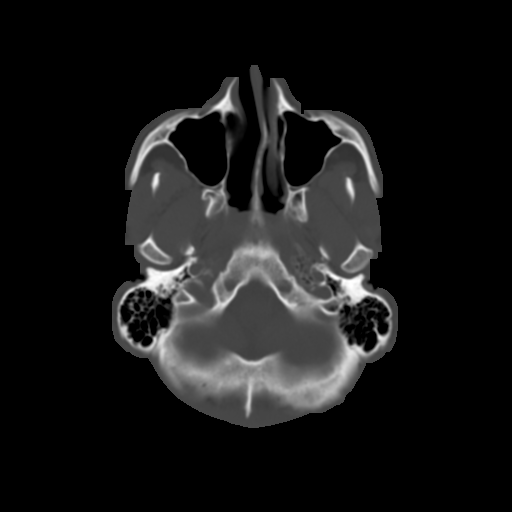
[im 5/31  brain]
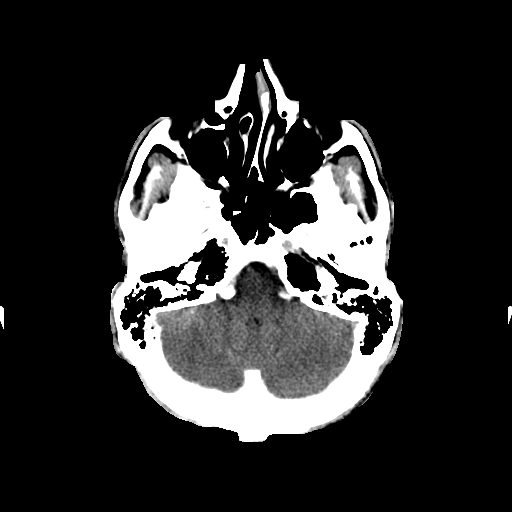
[im 7/31  brain]
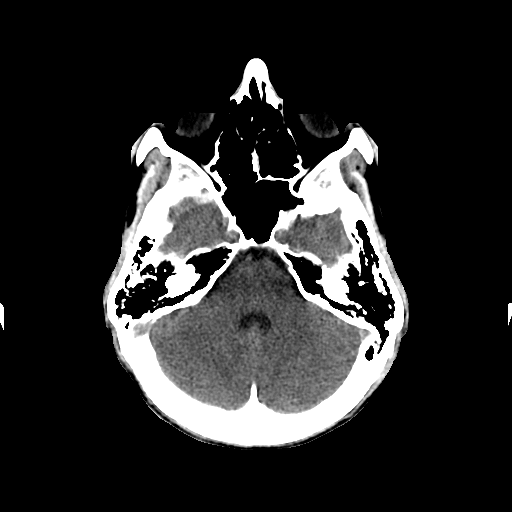
[im 9/31  brain]
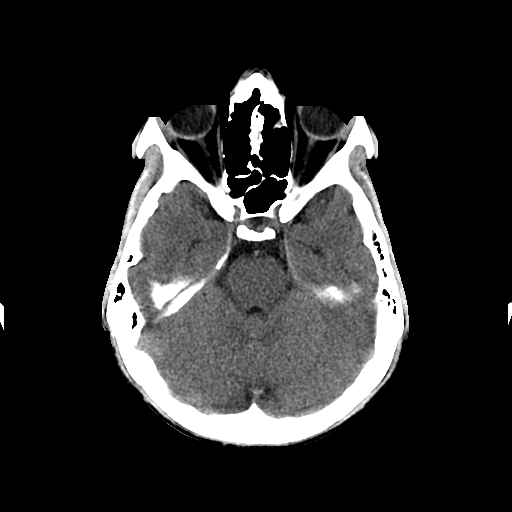
[im 11/31  brain]
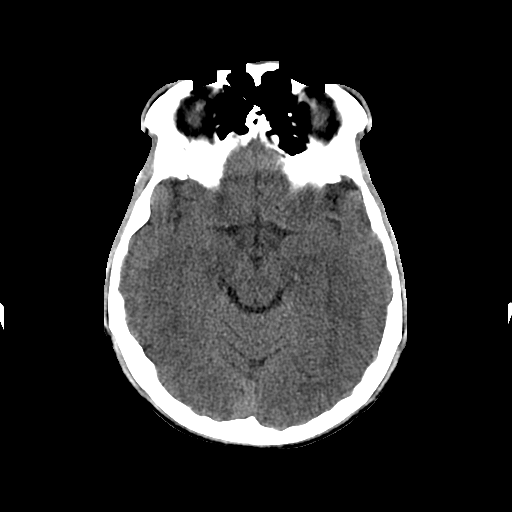
[im 11/31  bone]
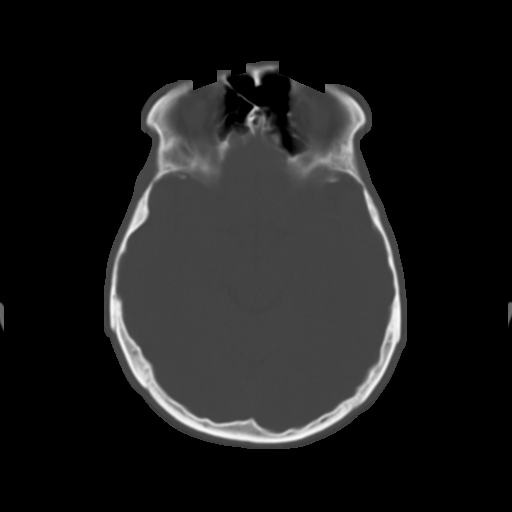
[im 13/31  brain]
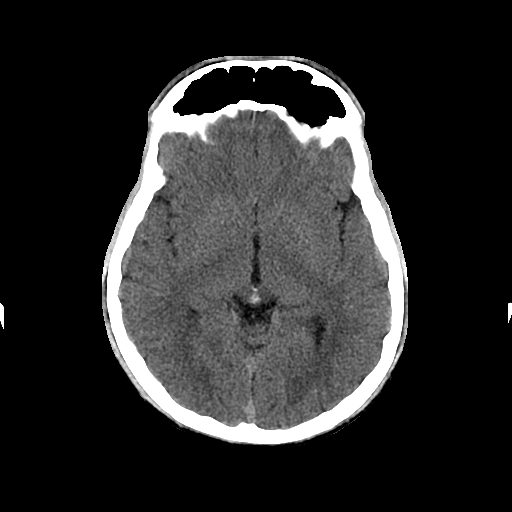
[im 16/31  brain]
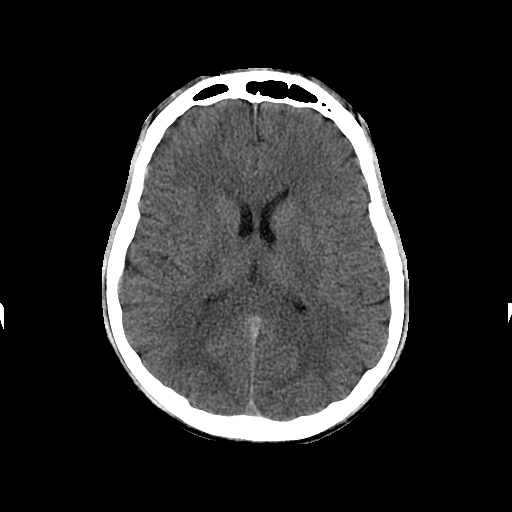
[im 18/31  brain]
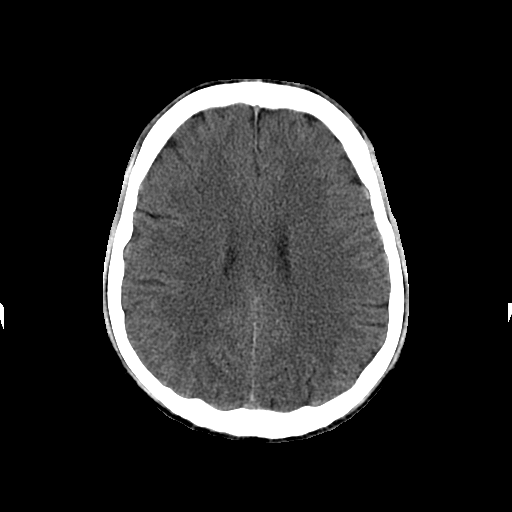
[im 20/31  brain]
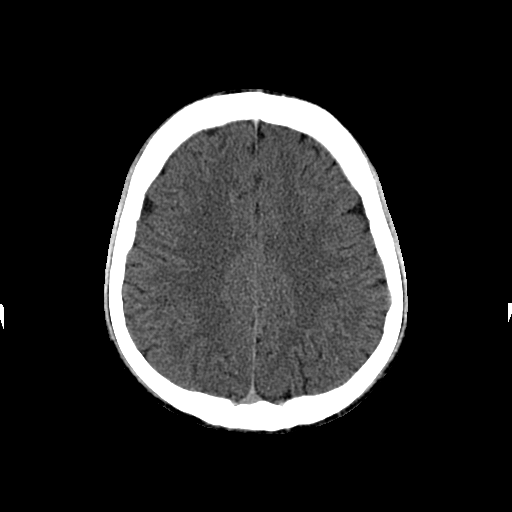
[im 20/31  bone]
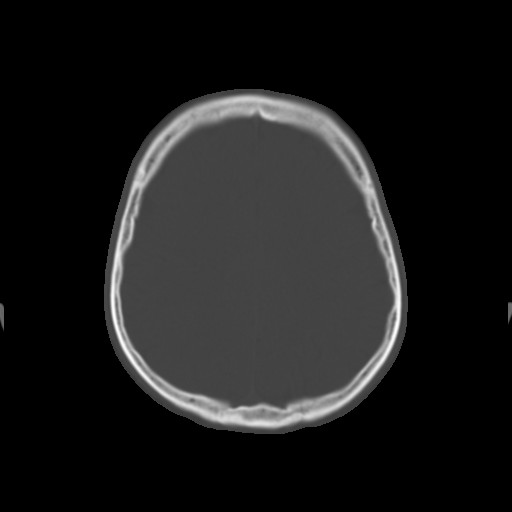
[im 22/31  brain]
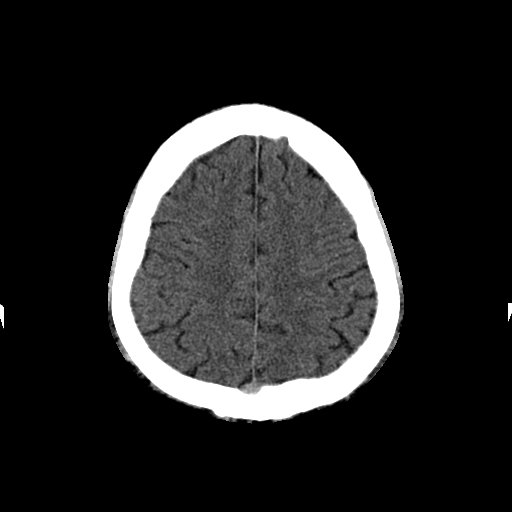
[im 24/31  brain]
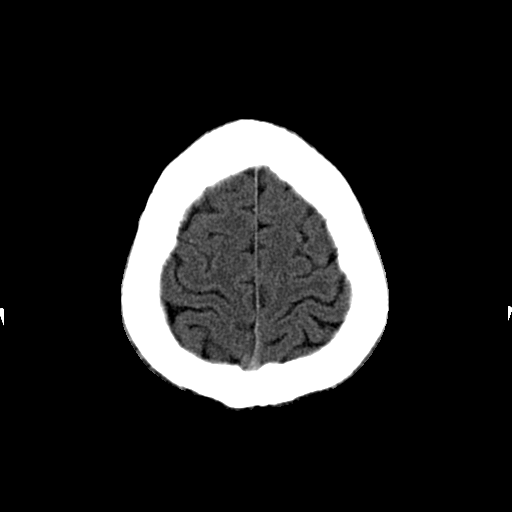
[im 26/31  brain]
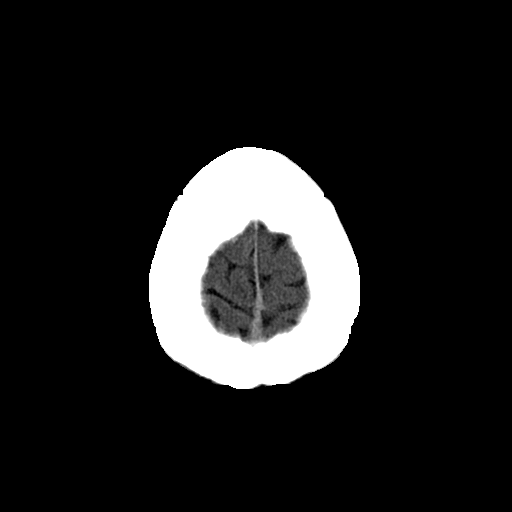
[im 28/31  brain]
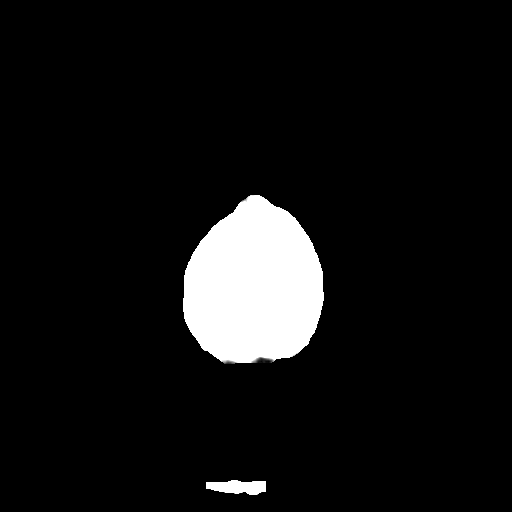
[im 28/31  bone]
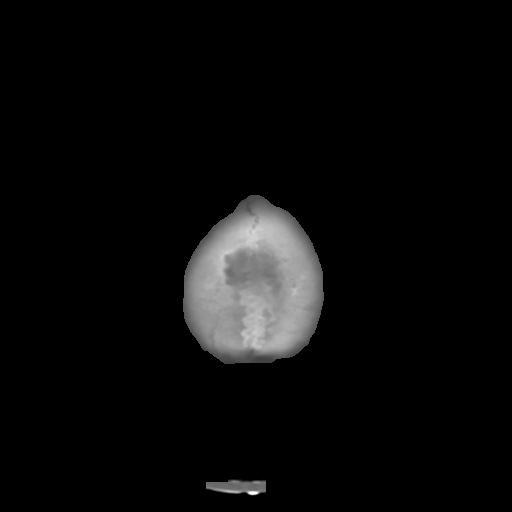

[Series 202: head w/o bone, idose (1) · axial · non-contrast · 0.49mm/px · z∈[+121,+141]mm · 2 of 31 slices shown]
[im 3/31  bone]
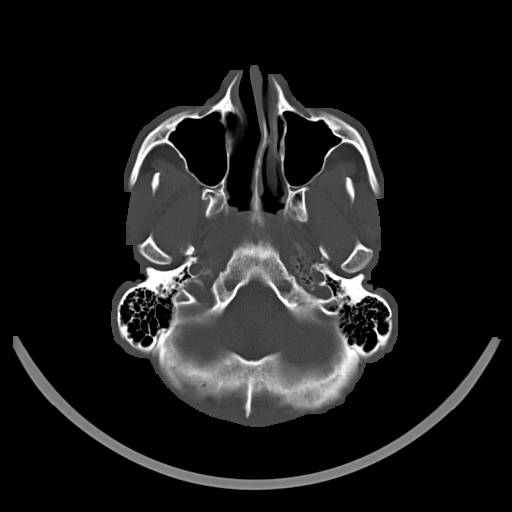
[im 7/31  bone]
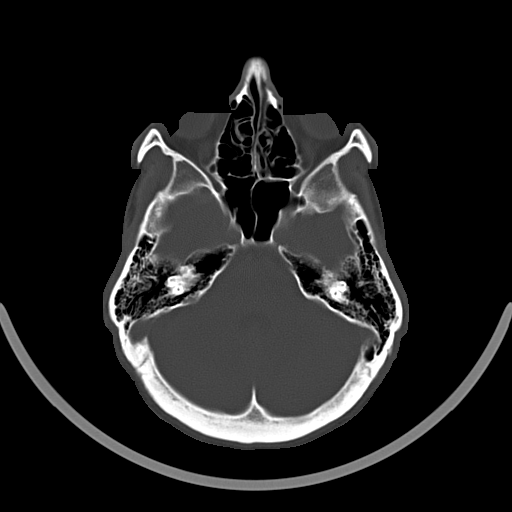

[15 of 30 positions shown; findings below may reference images not displayed]

FINDINGS: Mild left frontoethmoidal recess mucosal thickening, otherwise the
visible paranasal sinuses and mastoids are clear (and somewhat
hyperplastic). No acute osseous abnormality identified.

Visualized orbits and scalp soft tissues are within normal limits.

Cerebral volume is normal. No midline shift, ventriculomegaly, mass
effect, evidence of mass lesion, intracranial hemorrhage or evidence
of cortically based acute infarction. Gray-white matter
differentiation is within normal limits throughout the brain. No
suspicious intracranial vascular hyperdensity.
IMPRESSION: Stable and normal noncontrast CT appearance of the brain.

## 2015-02-08 ENCOUNTER — Emergency Department (HOSPITAL_COMMUNITY): Payer: Medicaid Other

## 2015-02-08 ENCOUNTER — Encounter (HOSPITAL_COMMUNITY): Payer: Self-pay | Admitting: *Deleted

## 2015-02-08 ENCOUNTER — Emergency Department (HOSPITAL_COMMUNITY)
Admission: EM | Admit: 2015-02-08 | Discharge: 2015-02-08 | Disposition: A | Discharge status: Home / Self Care | Payer: Medicaid Other | Attending: Emergency Medicine | Admitting: Emergency Medicine

## 2015-02-08 DIAGNOSIS — Z794 Long term (current) use of insulin: Secondary | ICD-10-CM | POA: Insufficient documentation

## 2015-02-08 DIAGNOSIS — Z8619 Personal history of other infectious and parasitic diseases: Secondary | ICD-10-CM | POA: Insufficient documentation

## 2015-02-08 DIAGNOSIS — Z8659 Personal history of other mental and behavioral disorders: Secondary | ICD-10-CM | POA: Insufficient documentation

## 2015-02-08 DIAGNOSIS — R739 Hyperglycemia, unspecified: Secondary | ICD-10-CM

## 2015-02-08 DIAGNOSIS — Z79899 Other long term (current) drug therapy: Secondary | ICD-10-CM | POA: Insufficient documentation

## 2015-02-08 DIAGNOSIS — J40 Bronchitis, not specified as acute or chronic: Secondary | ICD-10-CM

## 2015-02-08 DIAGNOSIS — J209 Acute bronchitis, unspecified: Secondary | ICD-10-CM | POA: Insufficient documentation

## 2015-02-08 DIAGNOSIS — F17210 Nicotine dependence, cigarettes, uncomplicated: Secondary | ICD-10-CM | POA: Insufficient documentation

## 2015-02-08 DIAGNOSIS — E1065 Type 1 diabetes mellitus with hyperglycemia: Secondary | ICD-10-CM | POA: Insufficient documentation

## 2015-02-08 DIAGNOSIS — R000 Tachycardia, unspecified: Secondary | ICD-10-CM | POA: Insufficient documentation

## 2015-02-08 DIAGNOSIS — Z9119 Patient's noncompliance with other medical treatment and regimen: Secondary | ICD-10-CM | POA: Insufficient documentation

## 2015-02-08 LAB — URINALYSIS, ROUTINE W REFLEX MICROSCOPIC
Bilirubin Urine: NEGATIVE
Glucose, UA: >1000 mg/dL — AB
Hgb urine dipstick: NEGATIVE
Ketones, ur: 15 mg/dL — AB
Leukocytes, UA: NEGATIVE
Nitrite: NEGATIVE
Protein, ur: NEGATIVE mg/dL
Specific Gravity, Urine: <1.005 — ABNORMAL LOW (ref 1.005–1.030)
pH: 6 (ref 5.0–8.0)

## 2015-02-08 LAB — BASIC METABOLIC PANEL
Anion gap: 15 (ref 5–15)
Anion gap: 8 (ref 5–15)
BUN: 26 mg/dL — ABNORMAL HIGH (ref 6–20)
BUN: 23 mg/dL — ABNORMAL HIGH (ref 6–20)
CO2: 24 mmol/L (ref 22–32)
CO2: 22 mmol/L (ref 22–32)
Calcium: 9.9 mg/dL (ref 8.9–10.3)
Calcium: 8.3 mg/dL — ABNORMAL LOW (ref 8.9–10.3)
Chloride: 87 mmol/L — ABNORMAL LOW (ref 101–111)
Chloride: 102 mmol/L (ref 101–111)
Creatinine, Ser: 0.96 mg/dL (ref 0.61–1.24)
Creatinine, Ser: 0.84 mg/dL (ref 0.61–1.24)
GFR calc Af Amer: >60 mL/min (ref >60)
GFR calc Af Amer: >60 mL/min (ref >60)
GFR calc non Af Amer: >60 mL/min (ref >60)
GFR calc non Af Amer: >60 mL/min (ref >60)
Glucose, Bld: 947 mg/dL (ref 65–99)
Glucose, Bld: 574 mg/dL (ref 65–99)
Potassium: 5.5 mmol/L — ABNORMAL HIGH (ref 3.5–5.1)
Potassium: 3.7 mmol/L (ref 3.5–5.1)
Sodium: 126 mmol/L — ABNORMAL LOW (ref 135–145)
Sodium: 132 mmol/L — ABNORMAL LOW (ref 135–145)

## 2015-02-08 LAB — CBC WITH DIFFERENTIAL/PLATELET
Basophils Absolute: 0.1 10*3/uL (ref 0.0–0.1)
Basophils Relative: 1 %
Eosinophils Absolute: 0.1 10*3/uL (ref 0.0–0.7)
Eosinophils Relative: 2 %
HCT: 48.2 % (ref 39.0–52.0)
Hemoglobin: 16.3 g/dL (ref 13.0–17.0)
Lymphocytes Relative: 37 %
Lymphs Abs: 2.6 10*3/uL (ref 0.7–4.0)
MCH: 32.7 pg (ref 26.0–34.0)
MCHC: 33.8 g/dL (ref 30.0–36.0)
MCV: 96.6 fL (ref 78.0–100.0)
Monocytes Absolute: 0.6 10*3/uL (ref 0.1–1.0)
Monocytes Relative: 8 %
Neutro Abs: 3.6 10*3/uL (ref 1.7–7.7)
Neutrophils Relative %: 52 %
Platelets: 220 10*3/uL (ref 150–400)
RBC: 4.99 MIL/uL (ref 4.22–5.81)
RDW: 12.5 % (ref 11.5–15.5)
WBC: 7 10*3/uL (ref 4.0–10.5)

## 2015-02-08 LAB — CBG MONITORING, ED
Glucose-Capillary: >600 mg/dL (ref 65–99)
Glucose-Capillary: 578 mg/dL (ref 65–99)
Glucose-Capillary: 392 mg/dL — ABNORMAL HIGH (ref 65–99)
Glucose-Capillary: 337 mg/dL — ABNORMAL HIGH (ref 65–99)

## 2015-02-08 LAB — URINE MICROSCOPIC-ADD ON
Bacteria, UA: NONE SEEN
RBC / HPF: NONE SEEN RBC/hpf (ref 0–5)
Squamous Epithelial / LPF: NONE SEEN

## 2015-02-08 MED ORDER — SODIUM CHLORIDE 0.9 % IV SOLN
INTRAVENOUS | Status: DC
  Filled 2015-02-08: qty 2.5

## 2015-02-08 MED ORDER — INSULIN ASPART 100 UNIT/ML ~~LOC~~ SOLN
10.0000 [IU] | Freq: Once | SUBCUTANEOUS | Status: AC
  Administered 2015-02-08: 10 [IU] via INTRAVENOUS
  Filled 2015-02-08: qty 1

## 2015-02-08 MED ORDER — SODIUM CHLORIDE 0.9 % IV SOLN
INTRAVENOUS | Status: AC
  Filled 2015-02-08: qty 2.5

## 2015-02-08 MED ORDER — SODIUM CHLORIDE 0.9 % IV BOLUS (SEPSIS)
2000.0000 mL | Freq: Once | INTRAVENOUS | Status: AC
  Administered 2015-02-08: 2000 mL via INTRAVENOUS

## 2015-02-08 MED ORDER — AZITHROMYCIN 250 MG PO TABS: ORAL_TABLET | ORAL | Status: DC

## 2015-02-08 MED ORDER — AZITHROMYCIN 250 MG PO TABS
500.0000 mg | ORAL_TABLET | Freq: Once | ORAL | Status: AC
  Administered 2015-02-08: 500 mg via ORAL
  Filled 2015-02-08: qty 2

## 2015-02-08 MED ORDER — INSULIN ASPART 100 UNIT/ML ~~LOC~~ SOLN
12.0000 [IU] | Freq: Once | SUBCUTANEOUS | Status: AC
  Administered 2015-02-08: 12 [IU] via INTRAVENOUS
  Filled 2015-02-08: qty 1

## 2015-02-08 MED ORDER — SODIUM CHLORIDE 0.9 % IV BOLUS (SEPSIS)
1000.0000 mL | Freq: Once | INTRAVENOUS | Status: AC
  Administered 2015-02-08: 1000 mL via INTRAVENOUS

## 2015-02-08 NOTE — Discharge Instructions (Signed)
Increase insulin to 23 units in morning and keep at 15 units in evening. Follow-up as soon as you can. Please see resource list.   Hyperglycemia Hyperglycemia occurs when the glucose (sugar) in your blood is too high. Hyperglycemia can happen for many reasons, but it most often happens to people who do not know they have diabetes or are not managing their diabetes properly.  CAUSES  Whether you have diabetes or not, there are other causes of hyperglycemia. Hyperglycemia can occur when you have diabetes, but it can also occur in other situations that you might not be as aware of, such as: Diabetes  If you have diabetes and are having problems controlling your blood glucose, hyperglycemia could occur because of some of the following reasons:  Not following your meal plan.  Not taking your diabetes medications or not taking it properly.  Exercising less or doing less activity than you normally do.  Being sick. Pre-diabetes  This cannot be ignored. Before people develop Type 2 diabetes, they almost always have "pre-diabetes." This is when your blood glucose levels are higher than normal, but not yet high enough to be diagnosed as diabetes. Research has shown that some long-term damage to the body, especially the heart and circulatory system, may already be occurring during pre-diabetes. If you take action to manage your blood glucose when you have pre-diabetes, you may delay or prevent Type 2 diabetes from developing. Stress  If you have diabetes, you may be "diet" controlled or on oral medications or insulin to control your diabetes. However, you may find that your blood glucose is higher than usual in the hospital whether you have diabetes or not. This is often referred to as "stress hyperglycemia." Stress can elevate your blood glucose. This happens because of hormones put out by the body during times of stress. If stress has been the cause of your high blood glucose, it can be followed  regularly by your caregiver. That way he/she can make sure your hyperglycemia does not continue to get worse or progress to diabetes. Steroids  Steroids are medications that act on the infection fighting system (immune system) to block inflammation or infection. One side effect can be a rise in blood glucose. Most people can produce enough extra insulin to allow for this rise, but for those who cannot, steroids make blood glucose levels go even higher. It is not unusual for steroid treatments to "uncover" diabetes that is developing. It is not always possible to determine if the hyperglycemia will go away after the steroids are stopped. A special blood test called an A1c is sometimes done to determine if your blood glucose was elevated before the steroids were started. SYMPTOMS  Thirsty.  Frequent urination.  Dry mouth.  Blurred vision.  Tired or fatigue.  Weakness.  Sleepy.  Tingling in feet or leg. DIAGNOSIS  Diagnosis is made by monitoring blood glucose in one or all of the following ways:  A1c test. This is a chemical found in your blood.  Fingerstick blood glucose monitoring.  Laboratory results. TREATMENT  First, knowing the cause of the hyperglycemia is important before the hyperglycemia can be treated. Treatment may include, but is not be limited to:  Education.  Change or adjustment in medications.  Change or adjustment in meal plan.  Treatment for an illness, infection, etc.  More frequent blood glucose monitoring.  Change in exercise plan.  Decreasing or stopping steroids.  Lifestyle changes. HOME CARE INSTRUCTIONS   Test your blood glucose as directed.  Exercise regularly. Your caregiver will give you instructions about exercise. Pre-diabetes or diabetes which comes on with stress is helped by exercising.  Eat wholesome, balanced meals. Eat often and at regular, fixed times. Your caregiver or nutritionist will give you a meal plan to guide your sugar  intake.  Being at an ideal weight is important. If needed, losing as little as 10 to 15 pounds may help improve blood glucose levels. SEEK MEDICAL CARE IF:   You have questions about medicine, activity, or diet.  You continue to have symptoms (problems such as increased thirst, urination, or weight gain). SEEK IMMEDIATE MEDICAL CARE IF:   You are vomiting or have diarrhea.  Your breath smells fruity.  You are breathing faster or slower.  You are very sleepy or incoherent.  You have numbness, tingling, or pain in your feet or hands.  You have chest pain.  Your symptoms get worse even though you have been following your caregiver's orders.  If you have any other questions or concerns.   This information is not intended to replace advice given to you by your health care provider. Make sure you discuss any questions you have with your health care provider.   Document Released: 08/17/2000 Document Revised: 05/16/2011 Document Reviewed: 10/28/2014 Elsevier Interactive Patient Education 2016 [employer].   [place] also add antibiotic for your chest cold.

## 2015-02-08 NOTE — ED Notes (Signed)
Pt states his blood sugar has been running high for the past month. Pt reports his sugars being over 600. Pt also c/o a sinus infection. Pt takes 70/30 novulin. Pt takes 20 units in the am and 15 at night.

## 2015-02-08 NOTE — ED Provider Notes (Addendum)
CSN: [id number]     Arrival date & time 02/08/15  1900 History   First MD Initiated Contact with Patient 02/08/15 1905     Chief Complaint  Patient presents with  . Hyperglycemia     (Consider location/radiation/quality/duration/timing/severity/associated sxs/prior Treatment) HPI   19 year old male with hyperglycemia. Patient states that he thinks he "might be close to going into DKA." He has a past history of type 1 diabetes. He has been admitted several times previously in DKA. Most recently this past August. He was previously on Lantus but Medicaid would not cover it so now takes 70/30. 20 units in the morning and 15 in the evening. He reports compliance. Over the last few days his sugars have been increasingly higher. Polyuria and polydipsia. On review of systems, he endorses several weeks of URI type symptoms. Primarily cough and congestion. No fever.   Past Medical History  Diagnosis Date  . ADHD (attention deficit hyperactivity disorder)   . DM type 1 (diabetes mellitus, type 1) ([place])     diagnosed age 83  . Herpes   . Noncompliance with medications    Past Surgical History  Procedure Laterality Date  . None     Family History  Problem Relation Age of Onset  . Diabetes Mellitus I Paternal Grandmother   . Diabetes Mellitus I Paternal Aunt    Social History  Substance Use Topics  . Smoking status: Current Every Day Smoker -- 0.50 packs/day for 2 years    Types: Cigarettes  . Smokeless tobacco: Current User  . Alcohol Use: No    Review of Systems  All systems reviewed and negative, other than as noted in HPI.   Allergies  Sulfa antibiotics  Home Medications   Prior to Admission medications   Medication Sig Start Date End Date Taking? Authorizing Provider  insulin aspart protamine- aspart (NOVOLOG MIX 70/30) (70-30) 100 UNIT/ML injection Inject 15-20 Units into the skin 2 (two) times daily. Patient injects 20 in morning and 15 in the evening.   Yes Historical  Provider, MD  gabapentin (NEURONTIN) 400 MG capsule Take 1 cap AM , 1 cap at  [facility] and 2 cap at bedtime Patient taking differently: Take 400-800 mg by mouth 3 (three) times daily. Take 1 capsule in the morning, 1 capsule at  [facility] and 2 capsules at bedtime. 04/23/14   Chipper Herb, MD  Insulin Pen Needle 31G X 6 MM MISC To check BG 3x daily. Dx E10.10 04/08/14   Lysbeth Penner, FNP  valACYclovir (VALTREX) 1000 MG tablet Take 1 tablet (1,000 mg total) by mouth 2 (two) times daily. 04/16/14   Lysbeth Penner, FNP   BP 146/90 mmHg  Pulse 109  Temp(Src) 98.1 F (36.7 C) (Oral)  Resp 18  Ht 5\' 11"  (1.803 m)  Wt 140 lb (63.504 kg)  BMI 19.53 kg/m2  SpO2 100% Physical Exam  Constitutional: He appears well-developed and well-nourished. No distress.  Sitting up in bed. Appears tired but in no acute distress. Pleasant.  HENT:  Head: Normocephalic and atraumatic.  Eyes: Conjunctivae are normal. Right eye exhibits no discharge. Left eye exhibits no discharge.  Neck: Neck supple.  Cardiovascular: Regular rhythm and normal heart sounds.  Exam reveals no gallop and no friction rub.   No murmur heard. Tachycardic.  Pulmonary/Chest: Effort normal and breath sounds normal. No respiratory distress.  Abdominal: Soft. He exhibits no distension. There is no tenderness.  Musculoskeletal: He exhibits no edema or tenderness.  Neurological: He is  alert.  Skin: Skin is warm and dry.  Psychiatric: He has a normal mood and affect. His behavior is normal. Thought content normal.  Nursing note and vitals reviewed.   ED Course  Procedures (including critical care time) Labs Review Labs Reviewed  BASIC METABOLIC PANEL - Abnormal; Notable for the following:    Sodium 126 (*)    Potassium 5.5 (*)    Chloride 87 (*)    Glucose, Bld 947 (*)    BUN 26 (*)    All other components within normal limits  URINALYSIS, ROUTINE W REFLEX MICROSCOPIC (NOT AT [facility]) - Abnormal; Notable for the following:    Specific  Gravity, Urine <1.005 (*)    Glucose, UA >1000 (*)    Ketones, ur 15 (*)    All other components within normal limits  CBG MONITORING, ED - Abnormal; Notable for the following:    Glucose-Capillary >600 (*)    All other components within normal limits  CBG MONITORING, ED - Abnormal; Notable for the following:    Glucose-Capillary 578 (*)    All other components within normal limits  CBG MONITORING, ED - Abnormal; Notable for the following:    Glucose-Capillary 392 (*)    All other components within normal limits  CBC WITH DIFFERENTIAL/PLATELET  URINE MICROSCOPIC-ADD ON  BASIC METABOLIC PANEL    Imaging Review No results found. I have personally reviewed and evaluated these images and lab results as part of my medical decision-making.   EKG Interpretation None      MDM   Final diagnoses:  Hyperglycemia    19 year old male with hyperglycemia. Endorses polyuria and polydipsia. His mentation is fine. On exam he is mildly tachycardic. He reports compliance with his medications. With his recent cough and increasing blood sugars will obtain a chest x-ray to evaluate for possible pneumonia, although my suspicion is that this is probably a viral process. IV fluids. Labs and urinalysis. Will ultimately need insulin. Electrolyte management.    Virgel Manifold, MD 02/08/15 2102  Initially anticipated admission. Glucose came down from 950 to 580 after not even 2L of IVF though. He is not in DKA and he doesn't want to be hospitalized. Renal function fine. Afebrile. No n/v. Mental status normal. Will give some insulin, additional IVF and repeat BMP. Assuming glucose comes into reasonable range and repeat BMP is acceptable, may end up discharging. Reports morning glucose typically 80-150. Will have him increase morning 70/30 to 23u until can follow-up. Reports no longer sees Mary-Margaret Hassell Done because of outstanding bills. Will provide with resource list if discharged.   Virgel Manifold,  MD 02/08/15 2207

## 2015-02-08 NOTE — ED Provider Notes (Signed)
Patient rechecked prior to discharge. Glucose has improved in the 300 range. Will add Zithromax for a URI.  Nat Christen, MD 02/08/15 2322

## 2015-02-08 NOTE — ED Notes (Signed)
CRITICAL VALUE ALERT  Critical value received:  glucose  Date of notification:  02/08/2015  Time of notification:  2035  Critical value read back:Yes.    Nurse who received alert:  Iona Coach  MD notified (1st page):  kohut   Time MD responded:  2035

## 2015-02-08 NOTE — ED Notes (Signed)
CBG IN TRIAGE READS HIGH

## 2015-02-08 NOTE — ED Notes (Addendum)
CBG 578 Dr Wilson Singer notified. Gave order to DC insulin pump and give 12 units novolog insulin.

## 2015-03-09 ENCOUNTER — Ambulatory Visit (HOSPITAL_COMMUNITY)
Admission: RE | Admit: 2015-03-09 | Discharge: 2015-03-09 | Disposition: A | Discharge status: Home / Self Care | Payer: Self-pay | Attending: Psychiatry | Admitting: Psychiatry

## 2015-03-09 DIAGNOSIS — F11288 Opioid dependence with other opioid-induced disorder: Secondary | ICD-10-CM | POA: Insufficient documentation

## 2015-03-09 DIAGNOSIS — Z833 Family history of diabetes mellitus: Secondary | ICD-10-CM | POA: Insufficient documentation

## 2015-03-09 DIAGNOSIS — Z9114 Patient's other noncompliance with medication regimen: Secondary | ICD-10-CM | POA: Insufficient documentation

## 2015-03-09 DIAGNOSIS — F329 Major depressive disorder, single episode, unspecified: Secondary | ICD-10-CM | POA: Insufficient documentation

## 2015-03-09 DIAGNOSIS — F1429 Cocaine dependence with unspecified cocaine-induced disorder: Secondary | ICD-10-CM | POA: Insufficient documentation

## 2015-03-09 DIAGNOSIS — F1219 Cannabis abuse with unspecified cannabis-induced disorder: Secondary | ICD-10-CM | POA: Insufficient documentation

## 2015-03-09 DIAGNOSIS — F17210 Nicotine dependence, cigarettes, uncomplicated: Secondary | ICD-10-CM | POA: Insufficient documentation

## 2015-03-09 DIAGNOSIS — F419 Anxiety disorder, unspecified: Secondary | ICD-10-CM | POA: Insufficient documentation

## 2015-03-09 DIAGNOSIS — F1029 Alcohol dependence with unspecified alcohol-induced disorder: Secondary | ICD-10-CM | POA: Insufficient documentation

## 2015-03-09 DIAGNOSIS — F909 Attention-deficit hyperactivity disorder, unspecified type: Secondary | ICD-10-CM | POA: Insufficient documentation

## 2015-03-09 DIAGNOSIS — B009 Herpesviral infection, unspecified: Secondary | ICD-10-CM | POA: Insufficient documentation

## 2015-03-09 DIAGNOSIS — E119 Type 2 diabetes mellitus without complications: Secondary | ICD-10-CM | POA: Insufficient documentation

## 2015-03-10 ENCOUNTER — Encounter (HOSPITAL_COMMUNITY): Payer: Self-pay | Admitting: Emergency Medicine

## 2015-03-10 ENCOUNTER — Emergency Department (HOSPITAL_COMMUNITY)
Admission: EM | Admit: 2015-03-10 | Discharge: 2015-03-11 | Disposition: A | Discharge status: Home / Self Care | Payer: Self-pay | Attending: Emergency Medicine | Admitting: Emergency Medicine

## 2015-03-10 DIAGNOSIS — Z8659 Personal history of other mental and behavioral disorders: Secondary | ICD-10-CM | POA: Insufficient documentation

## 2015-03-10 DIAGNOSIS — F1910 Other psychoactive substance abuse, uncomplicated: Secondary | ICD-10-CM

## 2015-03-10 DIAGNOSIS — F1410 Cocaine abuse, uncomplicated: Secondary | ICD-10-CM | POA: Insufficient documentation

## 2015-03-10 DIAGNOSIS — Z794 Long term (current) use of insulin: Secondary | ICD-10-CM | POA: Insufficient documentation

## 2015-03-10 DIAGNOSIS — F1210 Cannabis abuse, uncomplicated: Secondary | ICD-10-CM | POA: Insufficient documentation

## 2015-03-10 DIAGNOSIS — Z8619 Personal history of other infectious and parasitic diseases: Secondary | ICD-10-CM | POA: Insufficient documentation

## 2015-03-10 DIAGNOSIS — F1994 Other psychoactive substance use, unspecified with psychoactive substance-induced mood disorder: Secondary | ICD-10-CM

## 2015-03-10 DIAGNOSIS — E109 Type 1 diabetes mellitus without complications: Secondary | ICD-10-CM | POA: Insufficient documentation

## 2015-03-10 DIAGNOSIS — F17210 Nicotine dependence, cigarettes, uncomplicated: Secondary | ICD-10-CM | POA: Insufficient documentation

## 2015-03-10 DIAGNOSIS — Z9119 Patient's noncompliance with other medical treatment and regimen: Secondary | ICD-10-CM | POA: Insufficient documentation

## 2015-03-10 DIAGNOSIS — Z79899 Other long term (current) drug therapy: Secondary | ICD-10-CM | POA: Insufficient documentation

## 2015-03-10 LAB — CBG MONITORING, ED
GLUCOSE-CAPILLARY: 272 mg/dL — AB (ref 65–99)
GLUCOSE-CAPILLARY: 69 mg/dL (ref 65–99)
GLUCOSE-CAPILLARY: 320 mg/dL — AB (ref 65–99)
Glucose-Capillary: 344 mg/dL — ABNORMAL HIGH (ref 65–99)
Glucose-Capillary: 284 mg/dL — ABNORMAL HIGH (ref 65–99)
Glucose-Capillary: 246 mg/dL — ABNORMAL HIGH (ref 65–99)
Glucose-Capillary: 230 mg/dL — ABNORMAL HIGH (ref 65–99)

## 2015-03-10 LAB — ETHANOL

## 2015-03-10 LAB — BASIC METABOLIC PANEL
ANION GAP: 11 (ref 5–15)
BUN: 18 mg/dL (ref 6–20)
CALCIUM: 9.5 mg/dL (ref 8.9–10.3)
CHLORIDE: 101 mmol/L (ref 101–111)
CO2: 22 mmol/L (ref 22–32)
CREATININE: 0.76 mg/dL (ref 0.61–1.24)
GFR calc non Af Amer: >60 mL/min (ref >60)
Glucose, Bld: 360 mg/dL — ABNORMAL HIGH (ref 65–99)
Potassium: 4.2 mmol/L (ref 3.5–5.1)
SODIUM: 134 mmol/L — AB (ref 135–145)

## 2015-03-10 LAB — CBC WITH DIFFERENTIAL/PLATELET
BASOS ABS: 0.1 10*3/uL (ref 0.0–0.1)
BASOS PCT: 1 %
EOS ABS: 0.1 10*3/uL (ref 0.0–0.7)
Eosinophils Relative: 1 %
HEMATOCRIT: 47.5 % (ref 39.0–52.0)
HEMOGLOBIN: 15.7 g/dL (ref 13.0–17.0)
Lymphocytes Relative: 38 %
Lymphs Abs: 4.9 10*3/uL — ABNORMAL HIGH (ref 0.7–4.0)
MCH: 31.3 pg (ref 26.0–34.0)
MCHC: 33.1 g/dL (ref 30.0–36.0)
MCV: 94.8 fL (ref 78.0–100.0)
Monocytes Absolute: 0.5 10*3/uL (ref 0.1–1.0)
Monocytes Relative: 4 %
NEUTROS ABS: 7.6 10*3/uL (ref 1.7–7.7)
NEUTROS PCT: 58 %
Platelets: 290 10*3/uL (ref 150–400)
RBC: 5.01 MIL/uL (ref 4.22–5.81)
RDW: 13.2 % (ref 11.5–15.5)
WBC: 13.1 10*3/uL — AB (ref 4.0–10.5)

## 2015-03-10 LAB — RAPID URINE DRUG SCREEN, HOSP PERFORMED
Amphetamines: NOT DETECTED
BARBITURATES: NOT DETECTED
BENZODIAZEPINES: NOT DETECTED
COCAINE: POSITIVE — AB
OPIATES: NOT DETECTED
TETRAHYDROCANNABINOL: POSITIVE — AB

## 2015-03-10 MED ORDER — LORAZEPAM 1 MG PO TABS: 1.0000 mg | ORAL_TABLET | Freq: Three times a day (TID) | ORAL | Status: DC | PRN

## 2015-03-10 MED ORDER — INSULIN ASPART PROT & ASPART (70-30 MIX) 100 UNIT/ML ~~LOC~~ SUSP
20.0000 [IU] | Freq: Every day | SUBCUTANEOUS | Status: DC
  Administered 2015-03-10 – 2015-03-11 (×2): 20 [IU] via SUBCUTANEOUS
  Filled 2015-03-10: qty 10

## 2015-03-10 MED ORDER — SODIUM CHLORIDE 0.9 % IV BOLUS (SEPSIS)
1000.0000 mL | Freq: Once | INTRAVENOUS | Status: AC
  Administered 2015-03-10: 1000 mL via INTRAVENOUS

## 2015-03-10 MED ORDER — INSULIN ASPART 100 UNIT/ML ~~LOC~~ SOLN
0.0000 [IU] | Freq: Three times a day (TID) | SUBCUTANEOUS | Status: DC
  Administered 2015-03-10 (×2): 5 [IU] via SUBCUTANEOUS
  Administered 2015-03-11: 11 [IU] via SUBCUTANEOUS
  Filled 2015-03-10 (×3): qty 1

## 2015-03-10 MED ORDER — GABAPENTIN 400 MG PO CAPS
400.0000 mg | ORAL_CAPSULE | Freq: Two times a day (BID) | ORAL | Status: DC
  Administered 2015-03-10 – 2015-03-11 (×2): 400 mg via ORAL
  Filled 2015-03-10 (×2): qty 1

## 2015-03-10 MED ORDER — GABAPENTIN 400 MG PO CAPS
800.0000 mg | ORAL_CAPSULE | Freq: Every day | ORAL | Status: DC
  Administered 2015-03-10: 800 mg via ORAL
  Filled 2015-03-10: qty 2

## 2015-03-10 MED ORDER — INSULIN ASPART PROT & ASPART (70-30 MIX) 100 UNIT/ML ~~LOC~~ SUSP
15.0000 [IU] | Freq: Every day | SUBCUTANEOUS | Status: DC
  Administered 2015-03-10 (×2): 15 [IU] via SUBCUTANEOUS
  Filled 2015-03-10 (×2): qty 10

## 2015-03-10 MED ORDER — GABAPENTIN 400 MG PO CAPS: 400.0000 mg | ORAL_CAPSULE | Freq: Three times a day (TID) | ORAL | Status: DC

## 2015-03-10 MED ORDER — HYDROXYZINE HCL 25 MG PO TABS: 25.0000 mg | ORAL_TABLET | Freq: Three times a day (TID) | ORAL | Status: DC | PRN

## 2015-03-10 NOTE — ED Notes (Signed)
BH at bedside to evaluate pt

## 2015-03-10 NOTE — ED Notes (Signed)
Pt requested a little more private time to talk with their visitor. Will return shortly.

## 2015-03-10 NOTE — ED Notes (Signed)
Patient was informed that a urine sample is needed.

## 2015-03-10 NOTE — ED Notes (Signed)
Juice brought to pt, eating food will check sugar after lunch

## 2015-03-10 NOTE — Consult Note (Signed)
[facility]iat[facility] Consult   Reason for Consult:  Agitation,  Referring Physician:  EDP Patient Identification: Pedro [name] MRN:  [id number] Principal Diagnosis: Substance induced mood disorder ([place]) Diagnosis:   Patient Active Problem List   Diagnosis Date Noted  . Substance induced mood disorder [facility]) [F19.94] 07/22/2011    Priority: High    Class: Acute  . Tachycardia [R00.0] 10/17/2014  . Acute renal failure ([place]) [N17.9] 10/17/2014  . DKA (diabetic ketoacidoses) ([place]) [E13.10] 03/19/2014  . Diabetic neuropathy ([place]) [E11.40] 03/19/2014  . Dehydration [E86.0]   . Gastroenteritis [K52.9] 02/16/2014  . Diabetic ketoacidosis without coma associated with type 1 diabetes mellitus ([place]) [E10.10]   . Genital herpes simplex type 2 [A60.00] 12/15/2013  . Tobacco abuse [Z72.0] 12/15/2013  . DKA, type 1 ([place]) [E10.10] 12/15/2013  . Diabetic ketoacidosis ([place]) [E13.10] 10/05/2013  . Hyperkalemia [E87.5] 10/15/2012  . Leukocytosis [D72.829] 10/15/2012  . Noncompliance [Z91.19] 10/15/2012  . Polysubstance dependence ([place]) [F19.20] 07/22/2011    Class: Acute    Total Time spent with patient: 45 minutes  Subjective:   Pedro [name] is a 20 y.o. male patient admitted with  Agitation  HPI: Caucasian male, 20 years old was evaluated after he was brought in by his mother for excessive illicit drug use.  Patient reports that he uses Cocaine, Marijuana and Methamphetamine.  Patient was not able to quantify his use of these drugs but stated "I use enough to get me high"   Patient also stated that he is tired of using because he is losing his money and health.  He states that as long as he is using these drugs he cannot take care of his DM.  Patient denies SI/HI/AVH.  Patient reports poor sleep and appetite.  He denies MH diagnosis and does not see a Psychiatrist.  Patient is accepted for admission and we will seek placement at any facility with available bed.  Patient states that  he is interested in rehabilitation and not detox treatment.  We will refer patient to [facility]  In am.    Past Psychiatric History: Polysubstance dependence, Substance induced mood disorder.  Risk to Self: Is patient at risk for suicide?: No Risk to Others:   Prior Inpatient Therapy:   Prior Outpatient Therapy:    Past Medical History:  Past Medical History  Diagnosis Date  . ADHD (attention deficit hyperactivity disorder)   . DM type 1 (diabetes mellitus, type 1) ([place])     diagnosed age 37  . Herpes   . Noncompliance with medications     Past Surgical History  Procedure Laterality Date  . None     Family History:  Family History  Problem Relation Age of Onset  . Diabetes Mellitus I Paternal Grandmother   . Diabetes Mellitus I Paternal Aunt    Family Psychiatric  History:  Denies Social History:  History  Alcohol Use No     History  Drug Use  . Yes  . Special: Marijuana, Other-see comments, Benzodiazepines, Hydrocodone, Oxycodone    Comment: occ    Social History   Social History  . Marital Status: Single    Spouse Name: N/A  . Number of Children: N/A  . Years of Education: N/A   Social History Main Topics  . Smoking status: Current Every Day Smoker -- 0.50 packs/day for 2 years    Types: Cigarettes  . Smokeless tobacco: Current User  . Alcohol Use: No  . Drug Use: Yes    Special: Marijuana,  Other-see comments, Benzodiazepines, Hydrocodone, Oxycodone     Comment: occ  . Sexual Activity: Yes    Birth Control/ Protection: Condom   Other Topics Concern  . None   Social History Narrative   Additional Social History:    Allergies:   Allergies  Allergen Reactions  . Sulfa Antibiotics Hives    Fever     Labs:  Results for orders placed or performed during the hospital encounter of 03/10/15 (from the past 48 hour(s))  POC CBG, ED     Status: Abnormal   Collection Time: 03/10/15  1:47 AM  Result Value Ref Range   Glucose-Capillary 344 (H) 65 - 99  mg/dL  CBC with Differential     Status: Abnormal   Collection Time: 03/10/15  1:48 AM  Result Value Ref Range   WBC 13.1 (H) 4.0 - 10.5 K/uL   RBC 5.01 4.22 - 5.81 MIL/uL   Hemoglobin 15.7 13.0 - 17.0 g/dL   HCT 47.5 39.0 - 52.0 %   MCV 94.8 78.0 - 100.0 fL   MCH 31.3 26.0 - 34.0 pg   MCHC 33.1 30.0 - 36.0 g/dL   RDW 13.2 11.5 - 15.5 %   Platelets 290 150 - 400 K/uL   Neutrophils Relative % 58 %   Neutro Abs 7.6 1.7 - 7.7 K/uL   Lymphocytes Relative 38 %   Lymphs Abs 4.9 (H) 0.7 - 4.0 K/uL   Monocytes Relative 4 %   Monocytes Absolute 0.5 0.1 - 1.0 K/uL   Eosinophils Relative 1 %   Eosinophils Absolute 0.1 0.0 - 0.7 K/uL   Basophils Relative 1 %   Basophils Absolute 0.1 0.0 - 0.1 K/uL  Basic metabolic panel     Status: Abnormal   Collection Time: 03/10/15  1:48 AM  Result Value Ref Range   Sodium 134 (L) 135 - 145 mmol/L   Potassium 4.2 3.5 - 5.1 mmol/L   Chloride 101 101 - 111 mmol/L   CO2 22 22 - 32 mmol/L   Glucose, Bld 360 (H) 65 - 99 mg/dL   BUN 18 6 - 20 mg/dL   Creatinine, Ser 0.76 0.61 - 1.24 mg/dL   Calcium 9.5 8.9 - 10.3 mg/dL   GFR calc non Af Amer >60 >60 mL/min   GFR calc Af Amer >60 >60 mL/min    Comment: (NOTE) The eGFR has been calculated using the CKD EPI equation. This calculation has not been validated in all clinical situations. eGFR's persistently <60 mL/min signify possible Chronic Kidney Disease.    Anion gap 11 5 - 15  Ethanol     Status: None   Collection Time: 03/10/15  1:48 AM  Result Value Ref Range   Alcohol, Ethyl (B) <5 <5 mg/dL    Comment:        LOWEST DETECTABLE LIMIT FOR SERUM ALCOHOL IS 5 mg/dL FOR MEDICAL PURPOSES ONLY   Urine rapid drug screen (hosp performed)     Status: Abnormal   Collection Time: 03/10/15  2:53 AM  Result Value Ref Range   Opiates NONE DETECTED NONE DETECTED   Cocaine POSITIVE (A) NONE DETECTED   Benzodiazepines NONE DETECTED NONE DETECTED   Amphetamines NONE DETECTED NONE DETECTED    Tetrahydrocannabinol POSITIVE (A) NONE DETECTED   Barbiturates NONE DETECTED NONE DETECTED    Comment:        DRUG SCREEN FOR MEDICAL PURPOSES ONLY.  IF CONFIRMATION IS NEEDED FOR ANY PURPOSE, NOTIFY LAB WITHIN 5 DAYS.  LOWEST DETECTABLE LIMITS FOR URINE DRUG SCREEN Drug Class       Cutoff (ng/mL) Amphetamine      1000 Barbiturate      200 Benzodiazepine   086 Tricyclics       578 Opiates          300 Cocaine          300 THC              50   POC CBG, ED     Status: Abnormal   Collection Time: 03/10/15  3:54 AM  Result Value Ref Range   Glucose-Capillary 272 (H) 65 - 99 mg/dL  CBG monitoring, ED     Status: Abnormal   Collection Time: 03/10/15  9:10 AM  Result Value Ref Range   Glucose-Capillary 284 (H) 65 - 99 mg/dL   Comment 1 Notify RN   CBG monitoring, ED     Status: None   Collection Time: 03/10/15  1:03 PM  Result Value Ref Range   Glucose-Capillary 69 65 - 99 mg/dL  CBG monitoring, ED     Status: Abnormal   Collection Time: 03/10/15  2:00 PM  Result Value Ref Range   Glucose-Capillary 246 (H) 65 - 99 mg/dL    Current Facility-Administered Medications  Medication Dose Route Frequency Provider Last Rate Last Dose  . gabapentin (NEURONTIN) capsule 400 mg  400 mg Oral BID Royetta Asal, RPH   400 mg at 03/10/15 1058  . gabapentin (NEURONTIN) capsule 800 mg  800 mg Oral QHS Royetta Asal, RPH      . insulin aspart (novoLOG) injection 0-15 Units  0-15 Units Subcutaneous TID WC Orlie Dakin, MD   5 Units at 03/10/15 1522  . insulin aspart protamine- aspart (NOVOLOG MIX 70/30) injection 15 Units  15 Units Subcutaneous QHS Junius Creamer, NP   15 Units at 03/10/15 0225  . insulin aspart protamine- aspart (NOVOLOG MIX 70/30) injection 20 Units  20 Units Subcutaneous Q breakfast Deno Etienne, DO   20 Units at 03/10/15 0831   Current Outpatient Prescriptions  Medication Sig Dispense Refill  . gabapentin (NEURONTIN) 400 MG capsule Take 1 cap AM , 1 cap at  [facility] and 2  cap at bedtime (Patient taking differently: Take 400-800 mg by mouth 3 (three) times daily. Take 1 capsule in the morning, 1 capsule at  [facility] and 2 capsules at bedtime.) 120 capsule 11  . insulin aspart protamine- aspart (NOVOLOG MIX 70/30) (70-30) 100 UNIT/ML injection Inject 15-20 Units into the skin 2 (two) times daily. Patient injects 20 in morning and 15 in the evening.    Marland Kitchen azithromycin (ZITHROMAX) 250 MG tablet One tab daily for 4 days starting Monday evening (Patient not taking: Reported on 03/10/2015) 4 each 0  . Insulin Pen Needle 31G X 6 MM MISC To check BG 3x daily. Dx E10.10 (Patient not taking: Reported on 03/10/2015) 100 each 1  . valACYclovir (VALTREX) 1000 MG tablet Take 1 tablet (1,000 mg total) by mouth 2 (two) times daily. (Patient not taking: Reported on 03/10/2015) 20 tablet 0    Musculoskeletal: Strength & Muscle Tone: within normal limits Gait & Station: normal Patient leans: N/A  Psychiatric Specialty Exam: Review of Systems  Constitutional: Negative.   HENT: Negative.   Eyes: Negative.   Respiratory: Negative.   Cardiovascular: Negative.   Gastrointestinal: Negative.   Genitourinary: Negative.        Hx of renal failure,  Musculoskeletal: Negative.   Skin: Negative.  Neurological: Negative.   Endo/Heme/Allergies:       Hx of Dm, Uncontrolled     Blood pressure 101/66, pulse 79, temperature 97.8 F (36.6 C), temperature source Oral, resp. rate 18, SpO2 97 %.There is no weight on file to calculate BMI.  General Appearance: Casual and Disheveled  Eye Contact::  Minimal  Speech:  Clear and Coherent and Normal Rate  Volume:  Normal  Mood:  Depressed and Irritable  Affect:  Congruent and Depressed  Thought Process:  Coherent, Goal Directed and Intact  Orientation:  Full (Time, Place, and Person)  Thought Content:  WDL  Suicidal Thoughts:  No  Homicidal Thoughts:  No  Memory:  Immediate;   Good Recent;   Good Remote;   Good  Judgement:  Fair  Insight:  Fair   Psychomotor Activity:  Psychomotor Retardation  Concentration:  Good  Recall:  NA  Fund of Knowledge:Fair  Language: Good  Akathisia:  No  Handed:  Right  AIMS (if indicated):     Assets:  Desire for Improvement  ADL's:  Impaired  Cognition: WNL  Sleep:      Treatment Plan Summary: Daily contact with patient to assess and evaluate symptoms and progress in treatment and Medication management  Disposition: Observe overnight, will re-evaluate in am.  Will seek rehabilitation placement at [facility] in am.  We will resume his home medications, We will use Ativan for withdrawal symptoms and agitation.  We will offer Hydroxyzine 25 mg po every 6 hours as needed for anxiety.  Delfin Gant   PMHNP-BC 03/10/2015 5:10 PM Patient seen face-to-face for psychiatric evaluation, chart reviewed and case discussed with the physician extender and developed treatment plan. Reviewed the information documented and agree with the treatment plan. Corena Pilgrim, MD

## 2015-03-10 NOTE — ED Notes (Signed)
2 pt belonging bags under desk by room 25

## 2015-03-10 NOTE — ED Notes (Signed)
Pt admitted to the [facility] and states that he is ready to leave because he has been here for 2 days. He came for rehab because of using crack and methamphetamine. Currently he denies SI/HI.

## 2015-03-10 NOTE — ED Notes (Signed)
Pt AAO x 3, no distress noted, resting at present.  Remains SI, monitoring for safety, Q 15 min checks in effect.
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
## 2015-03-10 NOTE — ED Provider Notes (Addendum)
9:05 AM patient denies wanting to harm himself or someone else. He is requesting help with his drug problem . He's been using cocaine and methamphetamine. He is alert Glasgow Coma Score 15 and relates without difficulty. He did make some illusion to suicidal ideation last night. Psychiatry to evaluate patient here. Results for orders placed or performed during the hospital encounter of 03/10/15  CBC with Differential  Result Value Ref Range   WBC 13.1 (H) 4.0 - 10.5 K/uL   RBC 5.01 4.22 - 5.81 MIL/uL   Hemoglobin 15.7 13.0 - 17.0 g/dL   HCT 47.5 39.0 - 52.0 %   MCV 94.8 78.0 - 100.0 fL   MCH 31.3 26.0 - 34.0 pg   MCHC 33.1 30.0 - 36.0 g/dL   RDW 13.2 11.5 - 15.5 %   Platelets 290 150 - 400 K/uL   Neutrophils Relative % 58 %   Neutro Abs 7.6 1.7 - 7.7 K/uL   Lymphocytes Relative 38 %   Lymphs Abs 4.9 (H) 0.7 - 4.0 K/uL   Monocytes Relative 4 %   Monocytes Absolute 0.5 0.1 - 1.0 K/uL   Eosinophils Relative 1 %   Eosinophils Absolute 0.1 0.0 - 0.7 K/uL   Basophils Relative 1 %   Basophils Absolute 0.1 0.0 - 0.1 K/uL  Basic metabolic panel  Result Value Ref Range   Sodium 134 (L) 135 - 145 mmol/L   Potassium 4.2 3.5 - 5.1 mmol/L   Chloride 101 101 - 111 mmol/L   CO2 22 22 - 32 mmol/L   Glucose, Bld 360 (H) 65 - 99 mg/dL   BUN 18 6 - 20 mg/dL   Creatinine, Ser 0.76 0.61 - 1.24 mg/dL   Calcium 9.5 8.9 - 10.3 mg/dL   GFR calc non Af Amer >60 >60 mL/min   GFR calc Af Amer >60 >60 mL/min   Anion gap 11 5 - 15  Urine rapid drug screen (hosp performed)  Result Value Ref Range   Opiates NONE DETECTED NONE DETECTED   Cocaine POSITIVE (A) NONE DETECTED   Benzodiazepines NONE DETECTED NONE DETECTED   Amphetamines NONE DETECTED NONE DETECTED   Tetrahydrocannabinol POSITIVE (A) NONE DETECTED   Barbiturates NONE DETECTED NONE DETECTED  Ethanol  Result Value Ref Range   Alcohol, Ethyl (B) <5 <5 mg/dL  POC CBG, ED  Result Value Ref Range   Glucose-Capillary 344 (H) 65 - 99 mg/dL  POC  CBG, ED  Result Value Ref Range   Glucose-Capillary 272 (H) 65 - 99 mg/dL  CBG monitoring, ED  Result Value Ref Range   Glucose-Capillary 284 (H) 65 - 99 mg/dL   Comment 1 Notify RN    Dg Chest 2 View  02/08/2015  CLINICAL DATA:  Productive cough for the past week. EXAM: CHEST  2 VIEW COMPARISON:  02/03/2015. FINDINGS: The heart size and mediastinal contours are within normal limits. Both lungs are clear. The visualized skeletal structures are unremarkable. IMPRESSION: Normal examination. Electronically Signed   By: Claudie Revering M.D.   On: 02/08/2015 20:40     Orlie Dakin, MD 03/10/15 [id number]  Orlie Dakin, MD 03/10/15 [id number]  Orlie Dakin, MD 03/10/15 [phone]

## 2015-03-10 NOTE — BH Assessment (Addendum)
[place] Assessment Progress Note  The following facilities have been contacted to seek placement for this pt, with results as noted:  Beds available, information sent, decision pending:  [facility]  Declined:  [place] (reason unspecified)   Jalene Mullet, [place] Triage Specialist [phone]

## 2015-03-10 NOTE — ED Notes (Signed)
Patient presents requesting rehab. Reports substance abuse of crack and meth, last used yesterday. Denies SI/HI, AVH. Denies other c/c.

## 2015-03-10 NOTE — ED Notes (Signed)
Snack was given

## 2015-03-10 NOTE — ED Notes (Signed)
Dr Gilles Chiquito states pt can't leave until he speaks to psych to be cleared.

## 2015-03-10 NOTE — ED Notes (Signed)
Per pharmacy pt allowed to take novolog insulin home with him if he chooses to.

## 2015-03-10 NOTE — ED Notes (Signed)
Pt alert and oriented x4. Respirations even and unlabored, bilateral symmetrical rise and fall of chest. Skin warm and dry. In no acute distress. Denies needs.   

## 2015-03-10 NOTE — ED Provider Notes (Signed)
CSN: [id number]     Arrival date & time 03/10/15  0049 History   First MD Initiated Contact with Patient 03/10/15 0129     Chief Complaint  Patient presents with  . Detox      (Consider location/radiation/quality/duration/timing/severity/associated sxs/prior Treatment) HPI Comments: This is a 20 year old insulin-dependent diabetic who is noncompliant with his medication regime, who also uses crack cocaine and methamphetamines on a regular basis.  He went to behavioral health tonight asking for help with his substance abuse.  They evaluated him, he does meet inpatient criteria for admission.  They sent him here to the emergency department for medical clearance and observation due to his history of diabetes and they do not have a bed available at this time for him He states that he has been on a crack binge for the past 3 days and has not eaten.  Has not checked his sugars, which is not unusual for him  The history is provided by the patient.    Past Medical History  Diagnosis Date  . ADHD (attention deficit hyperactivity disorder)   . DM type 1 (diabetes mellitus, type 1) ([place])     diagnosed age 89  . Herpes   . Noncompliance with medications    Past Surgical History  Procedure Laterality Date  . None     Family History  Problem Relation Age of Onset  . Diabetes Mellitus I Paternal Grandmother   . Diabetes Mellitus I Paternal Aunt    Social History  Substance Use Topics  . Smoking status: Current Every Day Smoker -- 0.50 packs/day for 2 years    Types: Cigarettes  . Smokeless tobacco: Current User  . Alcohol Use: No    Review of Systems  Respiratory: Negative for shortness of breath.   Cardiovascular: Negative for chest pain.  Gastrointestinal: Negative for nausea and vomiting.  Endocrine: Negative for polydipsia, polyphagia and polyuria.  Psychiatric/Behavioral: Negative for suicidal ideas and agitation. The patient is not nervous/anxious.       Allergies  Sulfa  antibiotics  Home Medications   Prior to Admission medications   Medication Sig Start Date End Date Taking? Authorizing Provider  gabapentin (NEURONTIN) 400 MG capsule Take 1 cap AM , 1 cap at  [facility] and 2 cap at bedtime Patient taking differently: Take 400-800 mg by mouth 3 (three) times daily. Take 1 capsule in the morning, 1 capsule at  [facility] and 2 capsules at bedtime. 04/23/14  Yes Chipper Herb, MD  insulin aspart protamine- aspart (NOVOLOG MIX 70/30) (70-30) 100 UNIT/ML injection Inject 15-20 Units into the skin 2 (two) times daily. Patient injects 20 in morning and 15 in the evening.   Yes Historical Provider, MD  azithromycin (ZITHROMAX) 250 MG tablet One tab daily for 4 days starting Monday evening Patient not taking: Reported on 03/10/2015 02/08/15   Nat Christen, MD  Insulin Pen Needle 31G X 6 MM MISC To check BG 3x daily. Dx E10.10 Patient not taking: Reported on 03/10/2015 04/08/14   Lysbeth Penner, FNP  valACYclovir (VALTREX) 1000 MG tablet Take 1 tablet (1,000 mg total) by mouth 2 (two) times daily. Patient not taking: Reported on 03/10/2015 04/16/14   Orson Ape Oxford, FNP   BP 100/75 mmHg  Pulse 92  Temp(Src) 98.3 F (36.8 C) (Oral)  Resp 18  SpO2 99% Physical Exam  Constitutional: He appears well-developed and well-nourished.  HENT:  Head: Normocephalic.  Eyes: Pupils are equal, round, and reactive to light.  Neck: Normal range  of motion.  Cardiovascular: Normal rate and regular rhythm.   Pulmonary/Chest: Effort normal.  Abdominal: Soft. He exhibits no distension. There is no tenderness.  Musculoskeletal: Normal range of motion.  Neurological: He is alert.  Skin: Skin is warm.  Psychiatric: He has a normal mood and affect. His speech is normal and behavior is normal. He expresses no homicidal and no suicidal ideation. He expresses no suicidal plans and no homicidal plans.  Nursing note and vitals reviewed.   ED Course  Procedures (including critical care time) Labs  Review Labs Reviewed  CBG MONITORING, ED - Abnormal; Notable for the following:    Glucose-Capillary 344 (*)    All other components within normal limits  CBC WITH DIFFERENTIAL/PLATELET  BASIC METABOLIC PANEL  URINE RAPID DRUG SCREEN, HOSP PERFORMED  ETHANOL    Imaging Review No results found. I have personally reviewed and evaluated these images and lab results as part of my medical decision-making.   EKG Interpretation None     Patient has been medically cleared for psychiatric admission.  His diabetes is well-controlled at this point, he is not in DKA MDM   Final diagnoses:  None         Junius Creamer, NP 03/10/15 [place], DO 03/10/15 0600

## 2015-03-11 LAB — CBG MONITORING, ED: Glucose-Capillary: 308 mg/dL — ABNORMAL HIGH (ref 65–99)

## 2015-03-11 NOTE — ED Notes (Signed)
Pt d/c hospital and a friend is to pick up pt. All items returned. D/C instructions given. All items returned.

## 2015-03-11 NOTE — B[facility] Suicide Risk Assessment (Cosign Needed)
Suicide Risk Assessment  Discharge Assessment   [facility] Discharge Suicide Risk Assessment   Demographic Factors:  Male, Adolescent or young adult, Low socioeconomic status and Unemployed  Total Time spent with patient: 20 minutes  Musculoskeletal: Strength & Muscle Tone: within normal limits Gait & Station: normal Patient leans: N/A  Psychiatric Specialty Exam:     Blood pressure 101/60, pulse 63, temperature 97.6 F (36.4 C), temperature source Oral, resp. rate 18, SpO2 100 %.There is no weight on file to calculate BMI.  General Appearance: Casual   Eye Contact:: good  Speech: Clear and Coherent and Normal Rate  Volume: Normal  Mood: EUTHYMIC, Calm  Affect: Congruent   Thought Process: Coherent, Goal Directed and Intact  Orientation: Full (Time, Place, and Person)  Thought Content: WDL  Suicidal Thoughts: No  Homicidal Thoughts: No  Memory: Immediate; Good Recent; Good Remote; Good  Judgement: Fair  Insight: good  Psychomotor Activity:Normal  Concentration: Good  Recall: NA  Fund of Knowledge:Fair  Language: Good  Akathisia: No  Handed: Right  AIMS (if indicated):    Assets: Desire for Improvement  ADL's:wnl  Cognition: WNL            Has this patient used any form of tobacco in the last 30 days? (Cigarettes, Smokeless Tobacco, Cigars, and/or Pipes) Yes, A prescription for an FDA-approved tobacco cessation medication was offered at discharge and the patient refused  Mental Status Per Nursing Assessment::   On Admission:     Current Mental Status by Physician: NA  Loss Factors: NA  Historical Factors: NA  Risk Reduction Factors:   Living with another person, especially a relative  Continued Clinical Symptoms:  Alcohol/Substance Abuse/Dependencies  Cognitive Features That Contribute To Risk:  Polarized thinking    Suicide Risk:  Minimal: No identifiable suicidal ideation.  Patients presenting with no  risk factors but with morbid ruminations; may be classified as minimal risk based on the severity of the depressive symptoms  Principal Problem: Substance induced mood disorder [facility]) Discharge Diagnoses:  Patient Active Problem List   Diagnosis Date Noted  . Substance induced mood disorder ([place]) [F19.94] 07/22/2011    Priority: Medium    Class: Acute  . Polysubstance abuse [F19.10]   . Tachycardia [R00.0] 10/17/2014  . Acute renal failure ([place]) [N17.9] 10/17/2014  . DKA (diabetic ketoacidoses) ([place]) [E13.10] 03/19/2014  . Diabetic neuropathy ([place]) [E11.40] 03/19/2014  . Dehydration [E86.0]   . Gastroenteritis [K52.9] 02/16/2014  . Diabetic ketoacidosis without coma associated with type 1 diabetes mellitus ([place]) [E10.10]   . Genital herpes simplex type 2 [A60.00] 12/15/2013  . Tobacco abuse [Z72.0] 12/15/2013  . DKA, type 1 ([place]) [E10.10] 12/15/2013  . Diabetic ketoacidosis ([place]) [E13.10] 10/05/2013  . Hyperkalemia [E87.5] 10/15/2012  . Leukocytosis [D72.829] 10/15/2012  . Noncompliance [Z91.19] 10/15/2012  . Polysubstance dependence ([place]) [F19.20] 07/22/2011    Class: Acute      Plan Of Care/Follow-up recommendations:  Activity:  as tolerated Diet:  regular  Is patient on multiple antipsychotic therapies at discharge:  No   Has Patient had three or more failed trials of antipsychotic monotherapy by history:  No  Recommended Plan for Multiple Antipsychotic Therapies: NA    Delfin Gant   PMHNP-BC 03/11/2015, 10:52 AM

## 2015-03-11 NOTE — Discharge Instructions (Signed)
To help you maintains a sober lifestyle, a substance abuse treatment program may be beneficial to you.  Contact one of the following treatment providers to ask about enrolling in their program:       [facility]      [place], [place] [address]      [phone]       [facility]      [place], [place] [address]      [phone]

## 2015-03-11 NOTE — Consult Note (Signed)
Psychiatric Specialty Exam: Physical Exam  ROS  Blood pressure 101/60, pulse 63, temperature 97.6 F (36.4 C), temperature source Oral, resp. rate 18, SpO2 100 %.There is no weight on file to calculate BMI.  General Appearance: Casual  Eye Contact:: Minimal  Speech: Clear and Coherent and Normal Rate  Volume: Normal  Mood: Euthymic,   Affect: Congruent   Thought Process: Coherent, Goal Directed and Intact  Orientation: Full (Time, Place, and Person)  Thought Content: WDL  Suicidal Thoughts: No  Homicidal Thoughts: No  Memory: Immediate; Good Recent; Good Remote; Good  Judgement: Fair  Insight: Fair  Psychomotor Activity: normal  Concentration: Good  Recall: NA  Fund of Knowledge: good  Language: Good  Akathisia: No  Handed: Right  AIMS (if indicated):    Assets: Desire for Improvement  ADL's: wnl  Cognition: WNL         Patient is calm and cooperative.  He denies any withdrawal symptoms at this time.  He is willing to go to rehabilitation as soon as he is discharged  Today.  Patient states that his DM is affected by his illicit drug use.  Patient denies SI/HI/AVH.  He is discharged home. Substance induced mood disorder ([place])   Plan:  Discharge home, seek rehabilitation care at [facility]   PMHNP-BC Patient seen face-to-face for psychiatric evaluation, chart reviewed and case discussed with the physician extender and developed treatment plan. Reviewed the information documented and agree with the treatment plan. Corena Pilgrim, MD

## 2015-03-11 NOTE — BH Assessment (Signed)
[place] Assessment Progress Note  Per Corena Pilgrim, MD, this pt does not require psychiatric hospitalization at this time.  He is to be discharged from [facility] with referrals to area substance abuse treatment programs.  Discharge instructions advise pt to follow up with [facility] and [facility].  Pt's nurse, Jan, has been notified.  Jalene Mullet, [place] Triage Specialist [phone]

## 2015-03-11 NOTE — ED Notes (Signed)
Pt is alert in his room and requesting to go to [facility] for detox. Pt reports that he would like long term treatment. He denies si and hi.

## 2015-05-31 IMAGING — CR DG CHEST 1V PORT
1 series · 1 of 1 positions shown · non-contrast
Comparison: PA and lateral chest 02/16/2014 and 02/01/2014.

CLINICAL DATA: Hyperglycemia.  Body aches and vomiting for 2 days.

EXAM:
PORTABLE CHEST - 1 VIEW

[portable]
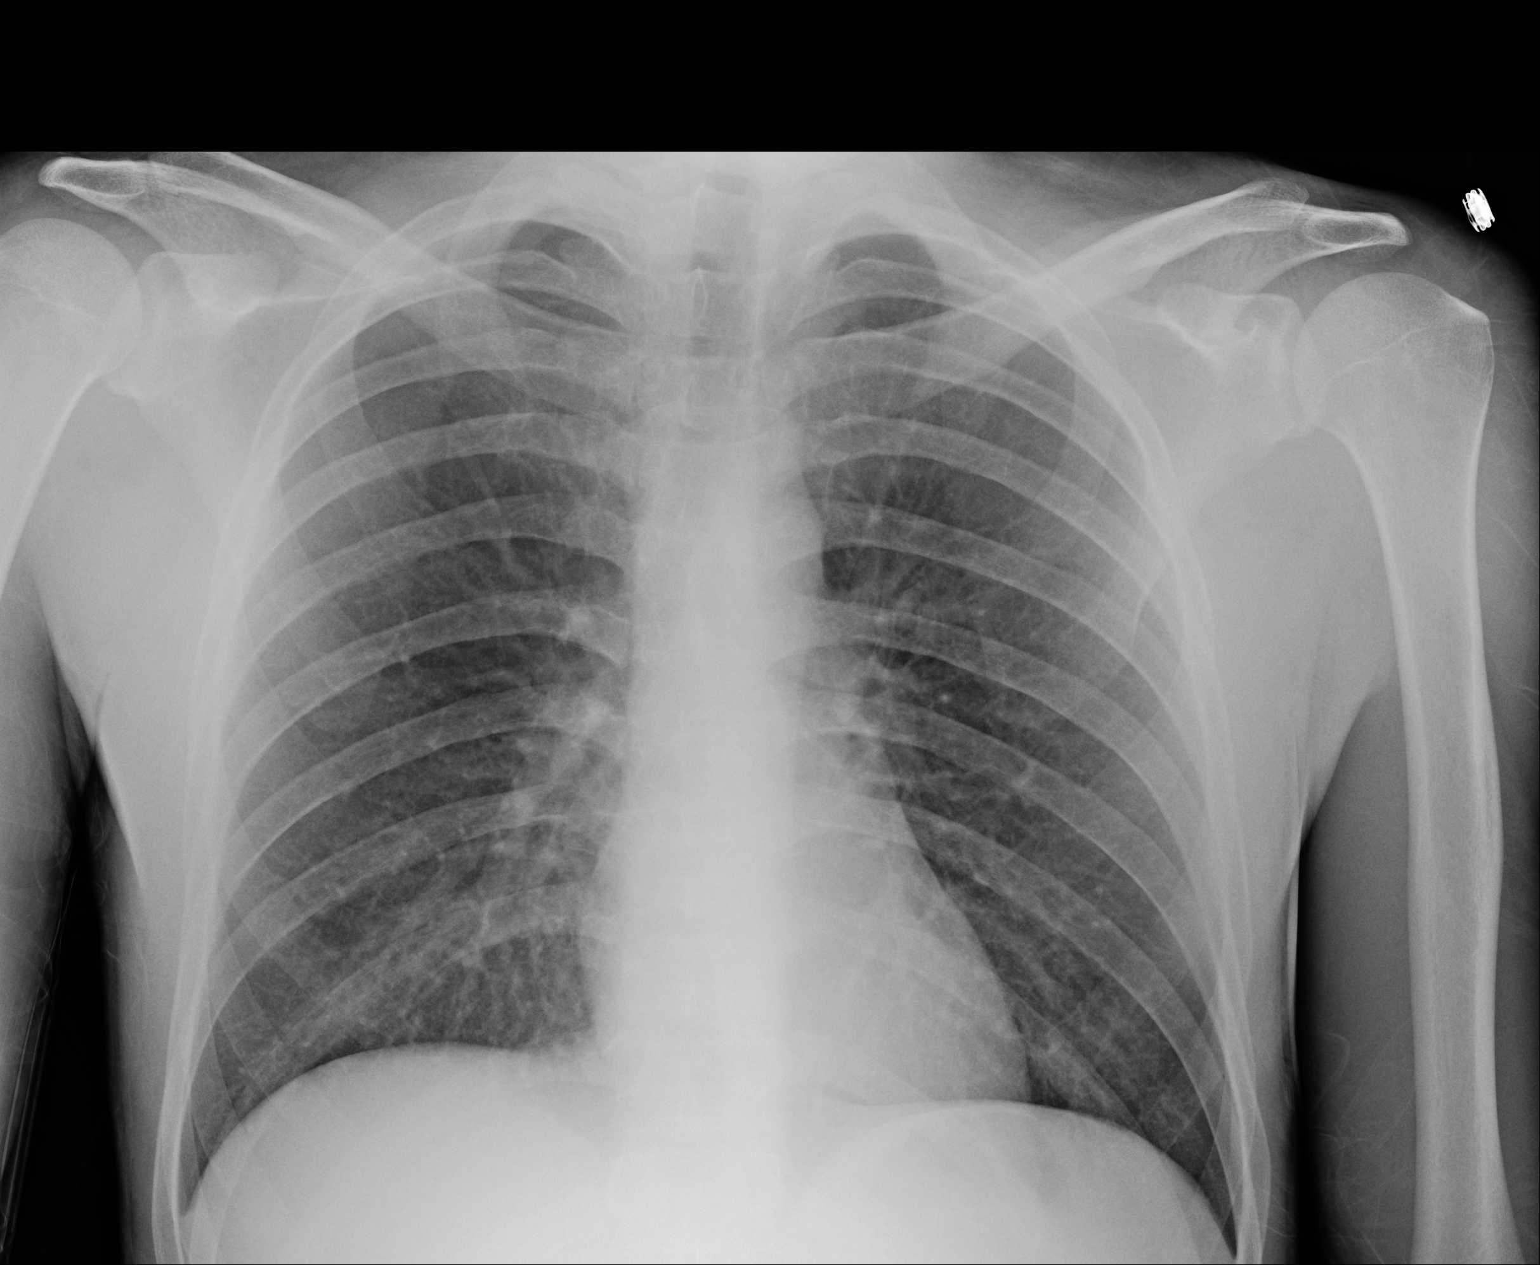

[1 of 1 positions shown; findings below may reference images not displayed]

FINDINGS: Heart size and mediastinal contours are within normal limits. Both
lungs are clear. Visualized skeletal structures are unremarkable.
IMPRESSION: Normal exam.

## 2015-09-15 ENCOUNTER — Telehealth: Payer: Self-pay | Admitting: Nurse Practitioner

## 2016-07-01 ENCOUNTER — Inpatient Hospital Stay (HOSPITAL_COMMUNITY)
Admission: EM | Admit: 2016-07-01 | Discharge: 2016-07-04 | DRG: 637 | Disposition: A | Discharge status: Home / Self Care | Payer: Self-pay | Attending: Internal Medicine | Admitting: Internal Medicine

## 2016-07-01 ENCOUNTER — Encounter (HOSPITAL_COMMUNITY): Payer: Self-pay

## 2016-07-01 ENCOUNTER — Inpatient Hospital Stay (HOSPITAL_COMMUNITY): Payer: Self-pay

## 2016-07-01 ENCOUNTER — Emergency Department (HOSPITAL_COMMUNITY): Payer: Self-pay

## 2016-07-01 DIAGNOSIS — E860 Dehydration: Secondary | ICD-10-CM

## 2016-07-01 DIAGNOSIS — D72829 Elevated white blood cell count, unspecified: Secondary | ICD-10-CM

## 2016-07-01 DIAGNOSIS — F17210 Nicotine dependence, cigarettes, uncomplicated: Secondary | ICD-10-CM | POA: Diagnosis present

## 2016-07-01 DIAGNOSIS — Z91148 Patient's other noncompliance with medication regimen for other reason: Secondary | ICD-10-CM

## 2016-07-01 DIAGNOSIS — B009 Herpesviral infection, unspecified: Secondary | ICD-10-CM | POA: Diagnosis present

## 2016-07-01 DIAGNOSIS — E876 Hypokalemia: Secondary | ICD-10-CM | POA: Diagnosis present

## 2016-07-01 DIAGNOSIS — Z833 Family history of diabetes mellitus: Secondary | ICD-10-CM

## 2016-07-01 DIAGNOSIS — E861 Hypovolemia: Secondary | ICD-10-CM | POA: Diagnosis present

## 2016-07-01 DIAGNOSIS — E109 Type 1 diabetes mellitus without complications: Secondary | ICD-10-CM | POA: Diagnosis present

## 2016-07-01 DIAGNOSIS — Z9114 Patient's other noncompliance with medication regimen: Secondary | ICD-10-CM

## 2016-07-01 DIAGNOSIS — Z882 Allergy status to sulfonamides status: Secondary | ICD-10-CM

## 2016-07-01 DIAGNOSIS — F909 Attention-deficit hyperactivity disorder, unspecified type: Secondary | ICD-10-CM | POA: Diagnosis present

## 2016-07-01 DIAGNOSIS — Z79899 Other long term (current) drug therapy: Secondary | ICD-10-CM

## 2016-07-01 DIAGNOSIS — R319 Hematuria, unspecified: Secondary | ICD-10-CM

## 2016-07-01 DIAGNOSIS — J189 Pneumonia, unspecified organism: Secondary | ICD-10-CM

## 2016-07-01 DIAGNOSIS — E1010 Type 1 diabetes mellitus with ketoacidosis without coma: Principal | ICD-10-CM

## 2016-07-01 DIAGNOSIS — N179 Acute kidney failure, unspecified: Secondary | ICD-10-CM

## 2016-07-01 DIAGNOSIS — M549 Dorsalgia, unspecified: Secondary | ICD-10-CM

## 2016-07-01 LAB — CBC
HEMATOCRIT: 43.6 % (ref 39.0–52.0)
HEMOGLOBIN: 14.7 g/dL (ref 13.0–17.0)
MCH: 29.6 pg (ref 26.0–34.0)
MCHC: 33.7 g/dL (ref 30.0–36.0)
MCV: 87.9 fL (ref 78.0–100.0)
PLATELETS: 301 10*3/uL (ref 150–400)
RBC: 4.96 MIL/uL (ref 4.22–5.81)
RDW: 13.6 % (ref 11.5–15.5)
WBC: 29.2 10*3/uL — ABNORMAL HIGH (ref 4.0–10.5)

## 2016-07-01 LAB — BASIC METABOLIC PANEL
ANION GAP: 41 — AB (ref 5–15)
ANION GAP: 6 (ref 5–15)
Anion gap: 13 (ref 5–15)
Anion gap: 8 (ref 5–15)
BUN: 69 mg/dL — ABNORMAL HIGH (ref 6–20)
BUN: 61 mg/dL — AB (ref 6–20)
BUN: 57 mg/dL — ABNORMAL HIGH (ref 6–20)
BUN: 54 mg/dL — ABNORMAL HIGH (ref 6–20)
BUN: 51 mg/dL — AB (ref 6–20)
CALCIUM: 7.2 mg/dL — AB (ref 8.9–10.3)
CALCIUM: 7.3 mg/dL — AB (ref 8.9–10.3)
CALCIUM: 7.5 mg/dL — AB (ref 8.9–10.3)
CHLORIDE: 77 mmol/L — AB (ref 101–111)
CHLORIDE: 100 mmol/L — AB (ref 101–111)
CHLORIDE: 106 mmol/L (ref 101–111)
CHLORIDE: 110 mmol/L (ref 101–111)
CO2: 7 mmol/L — ABNORMAL LOW (ref 22–32)
CO2: 9 mmol/L — AB (ref 22–32)
CO2: 17 mmol/L — AB (ref 22–32)
CO2: 20 mmol/L — AB (ref 22–32)
CO2: 19 mmol/L — ABNORMAL LOW (ref 22–32)
CREATININE: 2.11 mg/dL — AB (ref 0.61–1.24)
CREATININE: 1.54 mg/dL — AB (ref 0.61–1.24)
CREATININE: 1.12 mg/dL (ref 0.61–1.24)
Calcium: 9.1 mg/dL (ref 8.9–10.3)
Calcium: 7.8 mg/dL — ABNORMAL LOW (ref 8.9–10.3)
Chloride: 109 mmol/L (ref 101–111)
Creatinine, Ser: 3 mg/dL — ABNORMAL HIGH (ref 0.61–1.24)
Creatinine, Ser: 1.22 mg/dL (ref 0.61–1.24)
GFR calc Af Amer: 32 mL/min — ABNORMAL LOW (ref >60)
GFR calc Af Amer: 50 mL/min — ABNORMAL LOW (ref >60)
GFR calc Af Amer: >60 mL/min (ref >60)
GFR calc Af Amer: >60 mL/min (ref >60)
GFR calc non Af Amer: 43 mL/min — ABNORMAL LOW (ref >60)
GFR calc non Af Amer: >60 mL/min (ref >60)
GFR calc non Af Amer: >60 mL/min (ref >60)
GFR, EST NON AFRICAN AMERICAN: 28 mL/min — AB (ref >60)
GLUCOSE: 544 mg/dL — AB (ref 65–99)
GLUCOSE: 243 mg/dL — AB (ref 65–99)
GLUCOSE: 140 mg/dL — AB (ref 65–99)
Glucose, Bld: 1166 mg/dL (ref 65–99)
Glucose, Bld: 161 mg/dL — ABNORMAL HIGH (ref 65–99)
POTASSIUM: 5.7 mmol/L — AB (ref 3.5–5.1)
POTASSIUM: 3.5 mmol/L (ref 3.5–5.1)
Potassium: 4.4 mmol/L (ref 3.5–5.1)
Potassium: 3.7 mmol/L (ref 3.5–5.1)
Potassium: 3.9 mmol/L (ref 3.5–5.1)
SODIUM: 125 mmol/L — AB (ref 135–145)
SODIUM: 133 mmol/L — AB (ref 135–145)
SODIUM: 137 mmol/L (ref 135–145)
Sodium: 136 mmol/L (ref 135–145)
Sodium: 135 mmol/L (ref 135–145)

## 2016-07-01 LAB — GLUCOSE, CAPILLARY
GLUCOSE-CAPILLARY: 290 mg/dL — AB (ref 65–99)
GLUCOSE-CAPILLARY: 202 mg/dL — AB (ref 65–99)
GLUCOSE-CAPILLARY: 171 mg/dL — AB (ref 65–99)
GLUCOSE-CAPILLARY: 132 mg/dL — AB (ref 65–99)
GLUCOSE-CAPILLARY: 163 mg/dL — AB (ref 65–99)
GLUCOSE-CAPILLARY: 135 mg/dL — AB (ref 65–99)
Glucose-Capillary: >600 mg/dL (ref 65–99)
Glucose-Capillary: >600 mg/dL (ref 65–99)
Glucose-Capillary: 464 mg/dL — ABNORMAL HIGH (ref 65–99)
Glucose-Capillary: 362 mg/dL — ABNORMAL HIGH (ref 65–99)
Glucose-Capillary: 147 mg/dL — ABNORMAL HIGH (ref 65–99)
Glucose-Capillary: 158 mg/dL — ABNORMAL HIGH (ref 65–99)
Glucose-Capillary: 167 mg/dL — ABNORMAL HIGH (ref 65–99)

## 2016-07-01 LAB — CBC WITH DIFFERENTIAL/PLATELET
Basophils Absolute: 0 10*3/uL (ref 0.0–0.1)
Basophils Relative: 0 %
EOS PCT: 0 %
Eosinophils Absolute: 0 10*3/uL (ref 0.0–0.7)
HCT: 54.6 % — ABNORMAL HIGH (ref 39.0–52.0)
HEMOGLOBIN: 16.5 g/dL (ref 13.0–17.0)
LYMPHS ABS: 4.7 10*3/uL — AB (ref 0.7–4.0)
Lymphocytes Relative: 13 %
MCH: 29.7 pg (ref 26.0–34.0)
MCHC: 30.2 g/dL (ref 30.0–36.0)
MCV: 98.2 fL (ref 78.0–100.0)
MONO ABS: 2.5 10*3/uL — AB (ref 0.1–1.0)
MONOS PCT: 7 %
NEUTROS ABS: 28.9 10*3/uL — AB (ref 1.7–7.7)
Neutrophils Relative %: 80 %
PLATELETS: 378 10*3/uL (ref 150–400)
RBC: 5.56 MIL/uL (ref 4.22–5.81)
RDW: 14.4 % (ref 11.5–15.5)
WBC: 36.1 10*3/uL — AB (ref 4.0–10.5)

## 2016-07-01 LAB — URINALYSIS, ROUTINE W REFLEX MICROSCOPIC
BILIRUBIN URINE: NEGATIVE
Glucose, UA: >=500 mg/dL — AB
KETONES UR: 80 mg/dL — AB
Leukocytes, UA: NEGATIVE
Nitrite: NEGATIVE
PH: 5 (ref 5.0–8.0)
PROTEIN: NEGATIVE mg/dL
SQUAMOUS EPITHELIAL / LPF: NONE SEEN
Specific Gravity, Urine: 1.018 (ref 1.005–1.030)

## 2016-07-01 LAB — RAPID URINE DRUG SCREEN, HOSP PERFORMED
AMPHETAMINES: NOT DETECTED
BENZODIAZEPINES: POSITIVE — AB
Barbiturates: NOT DETECTED
COCAINE: NOT DETECTED
OPIATES: NOT DETECTED
Tetrahydrocannabinol: POSITIVE — AB

## 2016-07-01 LAB — CBG MONITORING, ED

## 2016-07-01 LAB — MRSA PCR SCREENING: MRSA by PCR: NEGATIVE

## 2016-07-01 LAB — ETHANOL

## 2016-07-01 LAB — MAGNESIUM: Magnesium: 1.9 mg/dL (ref 1.7–2.4)

## 2016-07-01 MED ORDER — SODIUM CHLORIDE 0.9 % IV SOLN
INTRAVENOUS | Status: DC
  Filled 2016-07-01: qty 2.5

## 2016-07-01 MED ORDER — SODIUM CHLORIDE 0.9 % IV SOLN
INTRAVENOUS | Status: DC
  Administered 2016-07-01: 5.4 [IU]/h via INTRAVENOUS
  Filled 2016-07-01: qty 2.5

## 2016-07-01 MED ORDER — SODIUM CHLORIDE 0.9 % IV BOLUS (SEPSIS)
250.0000 mL | Freq: Once | INTRAVENOUS | Status: AC
Start: 2016-07-01 — End: 2016-07-01
  Administered 2016-07-01: 250 mL via INTRAVENOUS

## 2016-07-01 MED ORDER — INSULIN ASPART 100 UNIT/ML ~~LOC~~ SOLN
3.0000 [IU] | Freq: Three times a day (TID) | SUBCUTANEOUS | Status: DC
  Administered 2016-07-01: 3 [IU] via SUBCUTANEOUS

## 2016-07-01 MED ORDER — INSULIN GLARGINE 100 UNIT/ML ~~LOC~~ SOLN
20.0000 [IU] | Freq: Every day | SUBCUTANEOUS | Status: DC
  Administered 2016-07-01: 20 [IU] via SUBCUTANEOUS
  Filled 2016-07-01 (×4): qty 0.2

## 2016-07-01 MED ORDER — INSULIN ASPART 100 UNIT/ML ~~LOC~~ SOLN
0.0000 [IU] | Freq: Three times a day (TID) | SUBCUTANEOUS | Status: DC
  Administered 2016-07-01: 2 [IU] via SUBCUTANEOUS

## 2016-07-01 MED ORDER — SODIUM CHLORIDE 0.9 % IV BOLUS (SEPSIS)
1000.0000 mL | Freq: Once | INTRAVENOUS | Status: AC
  Administered 2016-07-01: 1000 mL via INTRAVENOUS

## 2016-07-01 MED ORDER — SODIUM CHLORIDE 0.9 % IV SOLN
INTRAVENOUS | Status: DC
  Administered 2016-07-01 (×2): via INTRAVENOUS

## 2016-07-01 MED ORDER — ONDANSETRON HCL 4 MG/2ML IJ SOLN
4.0000 mg | Freq: Four times a day (QID) | INTRAMUSCULAR | Status: DC | PRN
  Administered 2016-07-01 – 2016-07-02 (×3): 4 mg via INTRAVENOUS
  Filled 2016-07-01 (×3): qty 2

## 2016-07-01 MED ORDER — SODIUM CHLORIDE 0.9 % IV SOLN: INTRAVENOUS | Status: DC

## 2016-07-01 MED ORDER — POTASSIUM CHLORIDE 10 MEQ/100ML IV SOLN
10.0000 meq | INTRAVENOUS | Status: DC
  Filled 2016-07-01 (×2): qty 100

## 2016-07-01 MED ORDER — SODIUM CHLORIDE 0.9 % IV SOLN
INTRAVENOUS | Status: DC
  Administered 2016-07-01 – 2016-07-02 (×2): via INTRAVENOUS

## 2016-07-01 MED ORDER — DEXTROSE-NACL 5-0.45 % IV SOLN
INTRAVENOUS | Status: DC
  Administered 2016-07-01: 13:00:00 via INTRAVENOUS

## 2016-07-01 MED ORDER — OXYCODONE-ACETAMINOPHEN 5-325 MG PO TABS
1.0000 | ORAL_TABLET | ORAL | Status: DC | PRN
  Administered 2016-07-01: 1 via ORAL
  Administered 2016-07-01: 2 via ORAL
  Filled 2016-07-01 (×3): qty 1

## 2016-07-01 MED ORDER — INSULIN REGULAR HUMAN 100 UNIT/ML IJ SOLN
INTRAMUSCULAR | Status: AC
  Filled 2016-07-01: qty 2.5

## 2016-07-01 NOTE — H&P (Signed)
History and Physical    Pedro [name] JJH:[id number] DOB: [birth year] DOA: 07/01/2016  PCP: Chevis Pretty, FNP   Patient coming from: Home.     Chief Complaint: abdominal cramps, nausea and vomiting.    HPI: Pedro [name] is an 21 y.o. male  with multiple medical problems including type 1 diabetes with frequent DKA,  last admission for DKA 1 month ago, presents to the emergency room with abdominal pain and pain nausea and vomiting and found to be in DKA and bicarbonate of 7 and AG of 41. Further evaluation included negative CXR , blood glucose greater than 1000, creatinine of 3.0    and potassium of 5.7 . Patient is alert and oriented and able to give a good history. There has been no fever, chills, cough, dysuria. Hospitalist was asked to admit her for DKA.  He doesn't have insurance, and has been cutting down on his insulin due to cost.   ED Course:  IVF, 2 L,    IV Insulin drip.   Rewiew of Systems:  Constitutional: Negative for malaise, fever and chills. No significant weight loss or weight gain Eyes: Negative for eye pain, redness and discharge, diplopia, visual changes, or flashes of light. ENMT: Negative for ear pain, hoarseness, nasal congestion, sinus pressure and sore throat. No headaches; tinnitus, drooling, or problem swallowing. Cardiovascular: Negative for chest pain, palpitations, diaphoresis, dyspnea and peripheral edema. ; No orthopnea, PND Respiratory: Negative for cough, hemoptysis, wheezing and stridor. No pleuritic chestpain. Gastrointestinal: Negative for diarrhea, constipation,  melena, blood in stool, hematemesis, jaundice and rectal bleeding.    Genitourinary: Negative for frequency, dysuria, incontinence,flank pain and hematuria; Musculoskeletal: Negative for back pain and neck pain. Negative for swelling and trauma.;  Skin: . Negative for pruritus, rash, abrasions, bruising and skin lesion.; ulcerations Neuro: Negative for headache, lightheadedness  and neck stiffness. Negative for weakness, altered level of consciousness , altered mental status, extremity weakness, burning feet, involuntary movement, seizure and syncope.  Psych: negative for anxiety, depression, insomnia, tearfulness, panic attacks, hallucinations, paranoia, suicidal or homicidal ideation    Past Medical History:  Diagnosis Date  . ADHD (attention deficit hyperactivity disorder)   . DM type 1 (diabetes mellitus, type 1) ([place])    diagnosed age 1  . Herpes   . Noncompliance with medications     Past Surgical History:  Procedure Laterality Date  . none       reports that he has been smoking Cigarettes.  He has a 1.00 pack-year smoking history. He uses smokeless tobacco. He reports that he uses drugs, including Marijuana, Other-see comments, Benzodiazepines, Hydrocodone, and Oxycodone. He reports that he does not drink alcohol.  Allergies  Allergen Reactions  . Sulfa Antibiotics Hives    Fever     Family History  Problem Relation Age of Onset  . Diabetes Mellitus I Paternal Grandmother   . Diabetes Mellitus I Paternal Aunt      Prior to Admission medications   Medication Sig Start Date End Date Taking? Authorizing Provider  insulin aspart protamine- aspart (NOVOLOG MIX 70/30) (70-30) 100 UNIT/ML injection Inject 15-20 Units into the skin 2 (two) times daily. Patient injects 20 in morning and 15 in the evening.   Yes Historical Provider, MD  azithromycin (ZITHROMAX) 250 MG tablet One tab daily for 4 days starting Monday evening Patient not taking: Reported on 03/10/2015 02/08/15   Nat Christen, MD  gabapentin (NEURONTIN) 400 MG capsule Take 1 cap AM , 1 cap at  [facility] and  2 cap at bedtime Patient taking differently: Take 400-800 mg by mouth 3 (three) times daily. Take 1 capsule in the morning, 1 capsule at  [facility] and 2 capsules at bedtime. 04/23/14   Chipper Herb, MD  Insulin Pen Needle 31G X 6 MM MISC To check BG 3x daily. Dx E10.10 Patient not taking: Reported  on 03/10/2015 04/08/14   Lysbeth Penner, FNP    Physical Exam: Vitals:   07/01/16 0236 07/01/16 0300 07/01/16 0330 07/01/16 0341  BP:  120/70 135/69   Pulse:  (!) 121 (!) 124   Resp:  (!) 27 (!) 28   Temp:    (!) 95.9 F (35.5 C)  TempSrc:    Tympanic  SpO2:  100% 100%   Weight: 63.5 kg (140 lb)     Height: 5\' 11"  (1.803 m)         Constitutional: NAD, calm, comfortable Vitals:   07/01/16 0236 07/01/16 0300 07/01/16 0330 07/01/16 0341  BP:  120/70 135/69   Pulse:  (!) 121 (!) 124   Resp:  (!) 27 (!) 28   Temp:    (!) 95.9 F (35.5 C)  TempSrc:    Tympanic  SpO2:  100% 100%   Weight: 63.5 kg (140 lb)     Height: 5\' 11"  (1.803 m)      Eyes: PERRL, lids and conjunctivae normal ENMT: Mucous membranes are moist. Posterior pharynx clear of any exudate or lesions.Normal dentition.  Neck: normal, supple, no masses, no thyromegaly Respiratory: clear to auscultation bilaterally, no wheezing, no crackles. Normal respiratory effort. No accessory muscle use.  Cardiovascular: Regular rate and rhythm, no murmurs / rubs / gallops. No extremity edema. 2+ pedal pulses. No carotid bruits.  Abdomen: no tenderness, no masses palpated. No hepatosplenomegaly. Bowel sounds positive.  Musculoskeletal: no clubbing / cyanosis. No joint deformity upper and lower extremities. Good ROM, no contractures. Normal muscle tone.  Skin: no rashes, lesions, ulcers. No induration Neurologic: CN 2-12 grossly intact. Sensation intact, DTR normal. Strength 5/5 in all 4.  Psychiatric: Normal judgment and insight. Alert and oriented x 3. Normal mood.   Labs on Admission: I have personally reviewed following labs and imaging studies CBC:  Recent Labs Lab 07/01/16 0240  WBC 36.1*  NEUTROABS 28.9*  HGB 16.5  HCT 54.6*  MCV 98.2  PLT 465   Basic Metabolic Panel:  Recent Labs Lab 07/01/16 0240  NA 125*  K 5.7*  CL 77*  CO2 7*  GLUCOSE 1,166*  BUN 69*  CREATININE 3.00*  CALCIUM 9.1   CBG:  Recent  Labs Lab 07/01/16 0236  GLUCAP >600*   Lipid Profile: Urine analysis:    Component Value Date/Time   COLORURINE YELLOW 07/01/2016 0311   APPEARANCEUR HAZY (A) 07/01/2016 0311   LABSPEC 1.018 07/01/2016 0311   PHURINE 5.0 07/01/2016 0311   GLUCOSEU >=500 (A) 07/01/2016 0311   HGBUR LARGE (A) 07/01/2016 0311   BILIRUBINUR NEGATIVE 07/01/2016 0311   BILIRUBINUR neg 04/16/2014 1752   KETONESUR 80 (A) 07/01/2016 0311   PROTEINUR NEGATIVE 07/01/2016 0311   UROBILINOGEN 0.2 10/17/2014 0743   NITRITE NEGATIVE 07/01/2016 0311   LEUKOCYTESUR NEGATIVE 07/01/2016 0311   Radiological Exams on Admission: Dg Chest Port 1 View  Result Date: 07/01/2016 CLINICAL DATA:  21 year old male with chest pain. EXAM: PORTABLE CHEST 1 VIEW COMPARISON:  Chest radiograph dated 02/08/2015 FINDINGS: The heart size and mediastinal contours are within normal limits. Both lungs are clear. The visualized skeletal structures are unremarkable. IMPRESSION:  No active disease. Electronically Signed   By: Anner Crete M.D.   On: 07/01/2016 03:43    EKG: Independently reviewed.   Assessment/Plan Principal Problem:   DM type 1 (diabetes mellitus, type 1) (HCC) Active Problems:   DKA, type 1 ([place])   Noncompliance with medications    PLAN:  Will admit patient to the ICU for severe DKA.  Will give IVF along with implementing the glucose stabalizer.   Will tx   antiemetics.  His AKI is likely pre renal and will follow with Cr with IVF.  Will hold his Neurontin and avoid nephrotoxic drug. There is no evidence of infection.  His leukocytosis is likely from volume depletion and stress demargination.    Patient is stable, full code, and will be admitted to [facility] service.  DVT prophylaxis: SCD.  Code Status: FULL CODE.  Family Communication:  Mother at bedside.  Disposition Plan: home.  Consults called: None.  Admission status: Inpatient.    Amri Lien MD FACP. [facility]    If 7PM-7AM, please contact  night-coverage [email or web address] Password [facility]  07/01/2016, 4:26 AM

## 2016-07-01 NOTE — Progress Notes (Signed)
PROGRESS NOTE    Pedro [name]  ZOX:[id number] DOB: Feb 20, 1996 DOA: 07/01/2016 PCP: Chevis Pretty, FNP    Brief Narrative: Pedro [name] is an 21 y.o. male  with multiple medical problems including type 1 diabetes with frequent DKA,  last admission for DKA 1 month ago, presents to the emergency room with abdominal pain and pain nausea and vomiting and found to be in DKA and bicarbonate of 7 and AG of 41. Further evaluation included negative CXR , blood glucose greater than 1000, creatinine of 3.0    and potassium of 5.7 . Patient is alert and oriented and able to give a good history. There has been no fever, chills, cough, dysuria. Hospitalist was asked to admit her for DKA.  He doesn't have insurance, and has been cutting down on his insulin due to cost.   Assessment & Plan:   Principal Problem:   DM type 1 (diabetes mellitus, type 1) ([place]) Active Problems:   DKA, type 1 ([place])   Noncompliance with medications   1-DKA; Presents with acidosis gap 41, bicarb 7. Glucose 1166.  Continue with IV fluids, IV bolus. Insulin Gtt.  Replete K as needed.  Follow B-met. Transition to long acting insulin when gap close and bicarb above 20/  NPO  2-AKI;  In setting of DKA, hypovolemia;  Improving with fluids.  Cr peak to 3.   3-Hematuria, back, flank pain.  Check renal US.  Lumbar spine x ray.   4-Leukocytosis Trending down.  Suspect stress.  Follow blood culture, urine culture.  Chest x ray negative.       DVT prophylaxis: (Lovenox/Heparin/SCD's/anticoagulated/None (if comfort care) Code Status: (Full/Partial - specify details) Family Communication: (Specify name, relationship & date discussed. NO "discussed with patient") Disposition Plan: (specify when and where you expect patient to be discharged). Include barriers to DC in this tab.   Consultants:   none   Procedures: renal US.    Antimicrobials: none   Subjective: Feels better, report flank pain,  back pain.  Nausea better    Objective: Vitals:   07/01/16 0430 07/01/16 0500 07/01/16 0530 07/01/16 0620  BP: 139/85 (!) 146/84 130/80   Pulse: (!) 124  (!) 126   Resp: (!) 25 (!) 33 (!) 29   Temp:    97.8 F (36.6 C)  TempSrc:    Oral  SpO2: 100%  99%   Weight:    59.9 kg (132 lb 0.9 oz)  Height:    _0  (1.803 m)    Intake/Output Summary (Last 24 hours) at 07/01/16 0723 Last data filed at 07/01/16 0434  Gross per 24 hour  Intake             2000 ml  Output              600 ml  Net             1400 ml   Filed Weights   07/01/16 0236 07/01/16 0620  Weight: 63.5 kg (140 lb) 59.9 kg (132 lb 0.9 oz)    Examination:  General exam: ill appearing.  Respiratory system: Clear to auscultation. Respiratory effort normal. Cardiovascular system: S1 & S2 heard, RRR. No JVD, murmurs, rubs, gallops or clicks. No pedal edema. Gastrointestinal system: Abdomen is nondistended, soft and nontender. No organomegaly or masses felt. Normal bowel sounds heard. Flank pain  Central nervous system: Alert and oriented. No focal neurological deficits. Extremities: Symmetric 5 x 5 power. Skin: No rashes, lesions or ulcers Psychiatry:  Judgement and insight appear normal. Mood & affect appropriate.     Data Reviewed: I have personally reviewed following labs and imaging studies  CBC:  Recent Labs Lab 07/01/16 0240  WBC 36.1*  NEUTROABS 28.9*  HGB 16.5  HCT 54.6*  MCV 98.2  PLT 664   Basic Metabolic Panel:  Recent Labs Lab 07/01/16 0240 07/01/16 0638  NA 125* 133*  K 5.7* 4.4  CL 77* 100*  CO2 7* 9*  GLUCOSE 1,166* 544*  BUN 69* 61*  CREATININE 3.00* 2.11*  CALCIUM 9.1 7.2*  MG  --  1.9   GFR: Estimated Creatinine Clearance: 46.5 mL/min (A) (by C-G formula based on SCr of 2.11 mg/dL (H)). Liver Function Tests: No results for input(s): AST, ALT, ALKPHOS, BILITOT, PROT, ALBUMIN in the last 168 hours. No results for input(s): LIPASE, AMYLASE in the last 168 hours. No  results for input(s): AMMONIA in the last 168 hours. Coagulation Profile: No results for input(s): INR, PROTIME in the last 168 hours. Cardiac Enzymes: No results for input(s): CKTOTAL, CKMB, CKMBINDEX, TROPONINI in the last 168 hours. BNP (last 3 results) No results for input(s): PROBNP in the last 8760 hours. HbA1C: No results for input(s): HGBA1C in the last 72 hours. CBG:  Recent Labs Lab 07/01/16 0236 07/01/16 0448 07/01/16 0553 07/01/16 0619  GLUCAP >600* >600* >600* >600*   Lipid Profile: No results for input(s): CHOL, HDL, LDLCALC, TRIG, CHOLHDL, LDLDIRECT in the last 72 hours. Thyroid Function Tests: No results for input(s): TSH, T4TOTAL, FREET4, T3FREE, THYROIDAB in the last 72 hours. Anemia Panel: No results for input(s): VITAMINB12, FOLATE, FERRITIN, TIBC, IRON, RETICCTPCT in the last 72 hours. Sepsis Labs: No results for input(s): PROCALCITON, LATICACIDVEN in the last 168 hours.  No results found for this or any previous visit (from the past 240 hour(s)).       Radiology Studies: Dg Chest Port 1 View  Result Date: 07/01/2016 CLINICAL DATA:  21 year old male with chest pain. EXAM: PORTABLE CHEST 1 VIEW COMPARISON:  Chest radiograph dated 02/08/2015 FINDINGS: The heart size and mediastinal contours are within normal limits. Both lungs are clear. The visualized skeletal structures are unremarkable. IMPRESSION: No active disease. Electronically Signed   By: Anner Crete M.D.   On: 07/01/2016 03:43        Scheduled Meds: Continuous Infusions: . sodium chloride    . dextrose 5 % and 0.45% NaCl    . insulin (NOVOLIN-R) infusion 16.2 Units/hr (07/01/16 0600)  . insulin (NOVOLIN-R) infusion       LOS: 0 days    Time spent: 35 minutes.     Elmarie Shiley, MD [facility] Pager [phone]  If 7PM-7AM, please contact night-coverage [email or web address] Password [facility] 07/01/2016, 7:23 AM

## 2016-07-01 NOTE — Progress Notes (Signed)
Inpatient Diabetes Program Recommendations  AACE/ADA: New Consensus Statement on Inpatient Glycemic Control (2015)  Target Ranges:  Prepandial:   less than 140 mg/dL      Peak postprandial:   less than 180 mg/dL (1-2 hours)      Critically ill patients:  140 - 180 mg/dL    Review of Glycemic Control  Diabetes history: DM1 Outpatient Diabetes medications: 70/30 20 units QAM, 70/30 15 units QPM Current orders for Inpatient glycemic control: IV insulin drip per GlucoStabilizer  Inpatient Diabetes Program Recommendations: Insulin - IV drip/GlucoStabilizer: Patient remains acidotic and will need to remain on IV insulin drip until acidosis resolved as determined by MD (Venous CO2=20, normal anion gap (8-12), negative ketones).   Insulin - Basal: Once acidosis is cleared and at time of transition from IV to SQ insulin, recommend starting on 70/30 15 units BID (will provide a total of 21 units for basal and 9 for meal coverage per day). Correction (SSI): Once acidosis is cleared and at time of transition from IV to SQ insulin, recommend ordering CBGs with Novolog 0-9 units TID with meals and Novolog 0-5 units QHS. HgbA1C: Please consider ordering an A1C to evaluate glycemic control over the past 2-3 months.  Thanks, Barnie Alderman, RN, MSN, CDE Diabetes Coordinator Inpatient Diabetes Program [phone] (Team Pager from 8am to 5pm)

## 2016-07-01 NOTE — ED Notes (Signed)
Pt given cup of water 

## 2016-07-01 NOTE — ED Notes (Signed)
Pt attempted to provide urine sample but was unable to. Urinal was left with pt and pt was instructed to informed nursing staff when able to provide a sample

## 2016-07-01 NOTE — ED Triage Notes (Signed)
Pt states he had a stomach bug for 2 days earlier in the week, tonight started feeling worse and his blood sugar was running high at home.

## 2016-07-01 NOTE — ED Notes (Signed)
CRITICAL VALUE ALERT  Critical value received:  Glucose 1166  Date of notification:  07/01/2016  Time of notification:  0324  Critical value read back:Yes.    Nurse who received alert:  Fabio Neighbors RN  MD notified (1st page):  Dr. Christy Gentles  Time of first page:  0324  MD notified (2nd page):  Time of second page:  Responding MD:    Time MD responded:

## 2016-07-01 NOTE — ED Notes (Signed)
Pt given cup of water per request and warm blanket given

## 2016-07-01 NOTE — ED Provider Notes (Signed)
[facility] DEPT Provider Note   CSN: [id number] Arrival date & time: 07/01/16  0222     History   Chief Complaint Chief Complaint  Patient presents with  . Hyperglycemia    HPI Pedro [name] is a 21 y.o. male.  The history is provided by the patient and a caregiver.  Hyperglycemia  Severity:  Severe Onset quality:  Gradual Timing:  Constant Progression:  Worsening Chronicity:  New Diabetes status:  Controlled with insulin Context: change in medication and noncompliance   Relieved by:  Nothing Ineffective treatments:  None tried Associated symptoms: abdominal pain, chest pain, dehydration, fatigue, increased thirst, shortness of breath and vomiting   Associated symptoms: no fever and no syncope    Patient with h/o diabetes presents with "stomach bug" for 2 days He reports increased nonbloody vomiting but no diarrhea No fever He reports fatigue He reports Chest pain and back pain with vomiting Reports chronic back pain for past month He also reports abdominal discomfort He reports since his vomiting worsened his glucose has been elevated He reports he ran out of some of his insulin the first of the month and since then his glucose has been difficult to control He reports he recently moved here from [place] but did have recent admission there for DKA  Past Medical History:  Diagnosis Date  . ADHD (attention deficit hyperactivity disorder)   . DM type 1 (diabetes mellitus, type 1) ([place])    diagnosed age 1  . Herpes   . Noncompliance with medications     Patient Active Problem List   Diagnosis Date Noted  . Polysubstance abuse   . Tachycardia 10/17/2014  . Acute renal failure ([place]) 10/17/2014  . DKA (diabetic ketoacidoses) ([place]) 03/19/2014  . Diabetic neuropathy ([place]) 03/19/2014  . Dehydration   . Gastroenteritis 02/16/2014  . Diabetic ketoacidosis without coma associated with type 1 diabetes mellitus ([place])   . Genital herpes simplex type 2 12/15/2013   . Tobacco abuse 12/15/2013  . DKA, type 1 ([place]) 12/15/2013  . Diabetic ketoacidosis ([place]) 10/05/2013  . Hyperkalemia 10/15/2012  . Leukocytosis 10/15/2012  . Noncompliance 10/15/2012  . Polysubstance dependence ([place]) 07/22/2011    Class: Acute  . Substance induced mood disorder ([place]) 07/22/2011    Class: Acute    Past Surgical History:  Procedure Laterality Date  . none         Home Medications    Prior to Admission medications   Medication Sig Start Date End Date Taking? Authorizing Provider  insulin aspart protamine- aspart (NOVOLOG MIX 70/30) (70-30) 100 UNIT/ML injection Inject 15-20 Units into the skin 2 (two) times daily. Patient injects 20 in morning and 15 in the evening.   Yes Historical Provider, MD  azithromycin (ZITHROMAX) 250 MG tablet One tab daily for 4 days starting Monday evening Patient not taking: Reported on 03/10/2015 02/08/15   Nat Christen, MD  gabapentin (NEURONTIN) 400 MG capsule Take 1 cap AM , 1 cap at  [facility] and 2 cap at bedtime Patient taking differently: Take 400-800 mg by mouth 3 (three) times daily. Take 1 capsule in the morning, 1 capsule at  [facility] and 2 capsules at bedtime. 04/23/14   Chipper Herb, MD  Insulin Pen Needle 31G X 6 MM MISC To check BG 3x daily. Dx E10.10 Patient not taking: Reported on 03/10/2015 04/08/14   Lysbeth Penner, FNP    Family History Family History  Problem Relation Age of Onset  . Diabetes Mellitus I Paternal Grandmother   .  Diabetes Mellitus I Paternal Aunt     Social History Social History  Substance Use Topics  . Smoking status: Current Every Day Smoker    Packs/day: 0.50    Years: 2.00    Types: Cigarettes  . Smokeless tobacco: Current User  . Alcohol use No     Allergies   Sulfa antibiotics   Review of Systems Review of Systems  Constitutional: Positive for fatigue. Negative for fever.  Respiratory: Positive for shortness of breath.   Cardiovascular: Positive for chest pain. Negative for syncope.    Gastrointestinal: Positive for abdominal pain and vomiting.  Endocrine: Positive for polydipsia.  Neurological: Positive for light-headedness. Negative for syncope.  All other systems reviewed and are negative.    Physical Exam Updated Vital Signs BP 118/69 (BP Location: Right Arm)   Pulse (!) 123   Resp (!) 24   Ht 5\' 11"  (1.803 m)   Wt 63.5 kg   SpO2 96%   BMI 19.53 kg/m   Physical Exam CONSTITUTIONAL: ill appearing, distress noted, smells of ketones.  He is disheveled HEAD: Normocephalic/atraumatic EYES: EOMI/PERRL ENMT: Mucous membranes dry NECK: supple no meningeal signs SPINE/BACK:entire spine nontender CV: S1/S2 noted, no murmurs/rubs/gallops noted LUNGS: Lungs are clear to auscultation bilaterally, no apparent distress, tachypneic ABDOMEN: soft, nontender, no rebound or guarding, bowel sounds noted throughout abdomen GU:no cva tenderness NEURO: Pt is awake/alert/appropriate, moves all extremitiesx4.  No facial droop.  No focal weakness noted in lower extremities EXTREMITIES: pulses normal/equal, full ROM SKIN: warm, color normal, multiple tattoos noted PSYCH: mildly anxious  ED Treatments / Results  Labs (all labs ordered are listed, but only abnormal results are displayed) Labs Reviewed  CBC WITH DIFFERENTIAL/PLATELET - Abnormal; Notable for the following:       Result Value   WBC 36.1 (*)    HCT 54.6 (*)    Neutro Abs 28.9 (*)    Lymphs Abs 4.7 (*)    Monocytes Absolute 2.5 (*)    All other components within normal limits  BASIC METABOLIC PANEL - Abnormal; Notable for the following:    Sodium 125 (*)    Potassium 5.7 (*)    Chloride 77 (*)    CO2 7 (*)    Glucose, Bld 1,166 (*)    BUN 69 (*)    Creatinine, Ser 3.00 (*)    GFR calc non Af Amer 28 (*)    GFR calc Af Amer 32 (*)    Anion gap 41 (*)    All other components within normal limits  CBG MONITORING, ED - Abnormal; Notable for the following:    Glucose-Capillary >600 (*)    All other  components within normal limits  ETHANOL  RAPID URINE DRUG SCREEN, HOSP PERFORMED  URINALYSIS, ROUTINE W REFLEX MICROSCOPIC    EKG  EKG Interpretation  Date/Time:  Friday July 01 2016 02:44:50 EDT Ventricular Rate:  121 PR Interval:    QRS Duration: 121 QT Interval:  358 QTC Calculation: 508 R Axis:   99 Text Interpretation:  Sinus tachycardia Consider right atrial enlargement RBBB and LPFB Abnormal ekg Interpretation limited secondary to artifact Confirmed by Christy Gentles  MD, Marlayna Bannister ([id number]) on 07/01/2016 2:51:12 AM       Radiology Dg Chest Port 1 View  Result Date: 07/01/2016 CLINICAL DATA:  21 year old male with chest pain. EXAM: PORTABLE CHEST 1 VIEW COMPARISON:  Chest radiograph dated 02/08/2015 FINDINGS: The heart size and mediastinal contours are within normal limits. Both lungs are clear. The visualized skeletal structures  are unremarkable. IMPRESSION: No active disease. Electronically Signed   By: Anner Crete M.D.   On: 07/01/2016 03:43    Procedures Procedures  CRITICAL CARE Performed by: Sharyon Cable Total critical care time: 35 minutes Critical care time was exclusive of separately billable procedures and treating other patients. Critical care was necessary to treat or prevent imminent or life-threatening deterioration. Critical care was time spent personally by me on the following activities: development of treatment plan with patient and/or surrogate as well as nursing, discussions with consultants, evaluation of patient's response to treatment, examination of patient, obtaining history from patient or surrogate, ordering and performing treatments and interventions, ordering and review of laboratory studies, ordering and review of radiographic studies, pulse oximetry and re-evaluation of patient's condition. PATIENT WITH DIABETIC KETOACIDOSIS, BICARB AT 7, GLUCOSE >1000, WILL NEED ADMISSION FOR IV FLUID AND IV INSULIN DRIP   Medications Ordered in  ED Medications  insulin regular (NOVOLIN R,HUMULIN R) 250 Units in sodium chloride 0.9 % 250 mL (1 Units/mL) infusion (5.4 Units/hr Intravenous New Bag/Given 07/01/16 0350)  sodium chloride 0.9 % bolus 1,000 mL (1,000 mLs Intravenous New Bag/Given 07/01/16 0254)  sodium chloride 0.9 % bolus 1,000 mL (1,000 mLs Intravenous New Bag/Given 07/01/16 0314)     Initial Impression / Assessment and Plan / ED Course  I have reviewed the triage vital signs and the nursing notes.  Pertinent labs & imaging results that were available during my care of the patient were reviewed by me and considered in my medical decision making (see chart for details).     .3:13 AM Pt in obvious DKA IV fluids ordered Will need insulin drip and admission 3:53 AM PT WITH SIGNIFICANT DKA HE IS AWAKE/ALERT BUT TACHYPNEIC HE IS REFUSING RECTAL TEMP TO GET AN ACCURATE TEMP BUT HE IS WARM TO TOUCH HE IS MENTATING APPROPRIATELY  D/W DR Collier [place]ina LE FOR ADMISSION TO ICU   Final Clinical Impressions(s) / ED Diagnoses   Final diagnoses:  Diabetic ketoacidosis without coma associated with type 1 diabetes mellitus ([place])  Dehydration  AKI (acute kidney injury) ([place])    New Prescriptions New Prescriptions   No medications on file     Ripley Fraise, MD 07/01/16 [id number]

## 2016-07-01 NOTE — ED Notes (Signed)
Report given to Fred RN in ICU 

## 2016-07-01 NOTE — Progress Notes (Signed)
Dr. Tyrell Antonio notified of latest BMP results @ 1415 as requested; MD to place orders accordingly.  MD also notified of potassium level 3.9 @ 1415 with active order to administer 10 mEq IV Potassium Chloride x2; verbal order received to hold & MD to discontinue order.

## 2016-07-02 ENCOUNTER — Inpatient Hospital Stay (HOSPITAL_COMMUNITY): Payer: Self-pay

## 2016-07-02 DIAGNOSIS — E1010 Type 1 diabetes mellitus with ketoacidosis without coma: Principal | ICD-10-CM

## 2016-07-02 LAB — GLUCOSE, CAPILLARY
GLUCOSE-CAPILLARY: 315 mg/dL — AB (ref 65–99)
GLUCOSE-CAPILLARY: 239 mg/dL — AB (ref 65–99)
GLUCOSE-CAPILLARY: 165 mg/dL — AB (ref 65–99)
GLUCOSE-CAPILLARY: 166 mg/dL — AB (ref 65–99)
GLUCOSE-CAPILLARY: 131 mg/dL — AB (ref 65–99)
GLUCOSE-CAPILLARY: 118 mg/dL — AB (ref 65–99)
GLUCOSE-CAPILLARY: 103 mg/dL — AB (ref 65–99)
GLUCOSE-CAPILLARY: 152 mg/dL — AB (ref 65–99)
Glucose-Capillary: 101 mg/dL — ABNORMAL HIGH (ref 65–99)
Glucose-Capillary: 114 mg/dL — ABNORMAL HIGH (ref 65–99)
Glucose-Capillary: 151 mg/dL — ABNORMAL HIGH (ref 65–99)
Glucose-Capillary: 172 mg/dL — ABNORMAL HIGH (ref 65–99)
Glucose-Capillary: 193 mg/dL — ABNORMAL HIGH (ref 65–99)
Glucose-Capillary: 314 mg/dL — ABNORMAL HIGH (ref 65–99)
Glucose-Capillary: 329 mg/dL — ABNORMAL HIGH (ref 65–99)

## 2016-07-02 LAB — BASIC METABOLIC PANEL
ANION GAP: 19 — AB (ref 5–15)
ANION GAP: 13 (ref 5–15)
ANION GAP: 8 (ref 5–15)
ANION GAP: 9 (ref 5–15)
ANION GAP: 10 (ref 5–15)
Anion gap: 14 (ref 5–15)
BUN: 43 mg/dL — AB (ref 6–20)
BUN: 41 mg/dL — ABNORMAL HIGH (ref 6–20)
BUN: 39 mg/dL — ABNORMAL HIGH (ref 6–20)
BUN: 35 mg/dL — ABNORMAL HIGH (ref 6–20)
BUN: 32 mg/dL — ABNORMAL HIGH (ref 6–20)
BUN: 29 mg/dL — ABNORMAL HIGH (ref 6–20)
CALCIUM: 8.3 mg/dL — AB (ref 8.9–10.3)
CALCIUM: 8.3 mg/dL — AB (ref 8.9–10.3)
CALCIUM: 8.8 mg/dL — AB (ref 8.9–10.3)
CHLORIDE: 113 mmol/L — AB (ref 101–111)
CHLORIDE: 112 mmol/L — AB (ref 101–111)
CHLORIDE: 110 mmol/L (ref 101–111)
CO2: 15 mmol/L — ABNORMAL LOW (ref 22–32)
CO2: 10 mmol/L — ABNORMAL LOW (ref 22–32)
CO2: 15 mmol/L — AB (ref 22–32)
CO2: 18 mmol/L — AB (ref 22–32)
CO2: 19 mmol/L — AB (ref 22–32)
CO2: 17 mmol/L — AB (ref 22–32)
CREATININE: 1.03 mg/dL (ref 0.61–1.24)
Calcium: 8.5 mg/dL — ABNORMAL LOW (ref 8.9–10.3)
Calcium: 8.5 mg/dL — ABNORMAL LOW (ref 8.9–10.3)
Calcium: 8.6 mg/dL — ABNORMAL LOW (ref 8.9–10.3)
Chloride: 108 mmol/L (ref 101–111)
Chloride: 107 mmol/L (ref 101–111)
Chloride: 110 mmol/L (ref 101–111)
Creatinine, Ser: 1.17 mg/dL (ref 0.61–1.24)
Creatinine, Ser: 1.21 mg/dL (ref 0.61–1.24)
Creatinine, Ser: 1.05 mg/dL (ref 0.61–1.24)
Creatinine, Ser: 0.89 mg/dL (ref 0.61–1.24)
Creatinine, Ser: 0.88 mg/dL (ref 0.61–1.24)
GFR calc Af Amer: >60 mL/min (ref >60)
GFR calc Af Amer: >60 mL/min (ref >60)
GFR calc Af Amer: >60 mL/min (ref >60)
GFR calc Af Amer: >60 mL/min (ref >60)
GFR calc Af Amer: >60 mL/min (ref >60)
GFR calc Af Amer: >60 mL/min (ref >60)
GFR calc non Af Amer: >60 mL/min (ref >60)
GFR calc non Af Amer: >60 mL/min (ref >60)
GFR calc non Af Amer: >60 mL/min (ref >60)
GFR calc non Af Amer: >60 mL/min (ref >60)
GFR calc non Af Amer: >60 mL/min (ref >60)
GLUCOSE: 355 mg/dL — AB (ref 65–99)
GLUCOSE: 287 mg/dL — AB (ref 65–99)
GLUCOSE: 169 mg/dL — AB (ref 65–99)
GLUCOSE: 111 mg/dL — AB (ref 65–99)
GLUCOSE: 154 mg/dL — AB (ref 65–99)
Glucose, Bld: 263 mg/dL — ABNORMAL HIGH (ref 65–99)
POTASSIUM: 3.8 mmol/L (ref 3.5–5.1)
POTASSIUM: 3.9 mmol/L (ref 3.5–5.1)
POTASSIUM: 3.6 mmol/L (ref 3.5–5.1)
POTASSIUM: 3.4 mmol/L — AB (ref 3.5–5.1)
POTASSIUM: 3.6 mmol/L (ref 3.5–5.1)
Potassium: 3.8 mmol/L (ref 3.5–5.1)
SODIUM: 137 mmol/L (ref 135–145)
Sodium: 136 mmol/L (ref 135–145)
Sodium: 141 mmol/L (ref 135–145)
Sodium: 138 mmol/L (ref 135–145)
Sodium: 138 mmol/L (ref 135–145)
Sodium: 137 mmol/L (ref 135–145)

## 2016-07-02 LAB — CBC
HCT: 37.6 % — ABNORMAL LOW (ref 39.0–52.0)
Hemoglobin: 12.9 g/dL — ABNORMAL LOW (ref 13.0–17.0)
MCH: 29.5 pg (ref 26.0–34.0)
MCHC: 34.3 g/dL (ref 30.0–36.0)
MCV: 85.8 fL (ref 78.0–100.0)
PLATELETS: 263 10*3/uL (ref 150–400)
RBC: 4.38 MIL/uL (ref 4.22–5.81)
RDW: 14.3 % (ref 11.5–15.5)
WBC: 19 10*3/uL — ABNORMAL HIGH (ref 4.0–10.5)

## 2016-07-02 LAB — HEPATIC FUNCTION PANEL
ALBUMIN: 2.9 g/dL — AB (ref 3.5–5.0)
ALT: 16 U/L — ABNORMAL LOW (ref 17–63)
AST: 20 U/L (ref 15–41)
Alkaline Phosphatase: 55 U/L (ref 38–126)
BILIRUBIN DIRECT: 0.2 mg/dL (ref 0.1–0.5)
BILIRUBIN TOTAL: 1.6 mg/dL — AB (ref 0.3–1.2)
Indirect Bilirubin: 1.4 mg/dL — ABNORMAL HIGH (ref 0.3–0.9)
Total Protein: 5 g/dL — ABNORMAL LOW (ref 6.5–8.1)

## 2016-07-02 LAB — HEMOGLOBIN A1C
Hgb A1c MFr Bld: 10.3 % — ABNORMAL HIGH (ref 4.8–5.6)
Mean Plasma Glucose: 249 mg/dL

## 2016-07-02 LAB — HIV ANTIBODY (ROUTINE TESTING W REFLEX): HIV Screen 4th Generation wRfx: NONREACTIVE

## 2016-07-02 MED ORDER — SODIUM CHLORIDE 0.9 % IV SOLN
INTRAVENOUS | Status: DC
  Administered 2016-07-02: 08:00:00 via INTRAVENOUS

## 2016-07-02 MED ORDER — SODIUM CHLORIDE 0.9 % IV BOLUS (SEPSIS)
500.0000 mL | Freq: Once | INTRAVENOUS | Status: AC
  Administered 2016-07-02: 500 mL via INTRAVENOUS

## 2016-07-02 MED ORDER — DEXTROSE 5 % IV SOLN
1.0000 g | INTRAVENOUS | Status: DC
  Administered 2016-07-02 – 2016-07-03 (×2): 1 g via INTRAVENOUS
  Filled 2016-07-02 (×4): qty 10

## 2016-07-02 MED ORDER — SODIUM CHLORIDE 0.9 % IV SOLN
INTRAVENOUS | Status: AC
  Administered 2016-07-02: 08:00:00 via INTRAVENOUS

## 2016-07-02 MED ORDER — POTASSIUM CHLORIDE 10 MEQ/100ML IV SOLN
10.0000 meq | INTRAVENOUS | Status: AC
  Administered 2016-07-02 (×2): 10 meq via INTRAVENOUS
  Filled 2016-07-02: qty 100

## 2016-07-02 MED ORDER — POTASSIUM CHLORIDE 10 MEQ/100ML IV SOLN
10.0000 meq | INTRAVENOUS | Status: AC
  Administered 2016-07-02 – 2016-07-03 (×3): 10 meq via INTRAVENOUS
  Filled 2016-07-02 (×2): qty 100

## 2016-07-02 MED ORDER — SODIUM CHLORIDE 0.9 % IV SOLN
INTRAVENOUS | Status: DC
  Administered 2016-07-02: 2.6 [IU]/h via INTRAVENOUS
  Filled 2016-07-02: qty 2.5

## 2016-07-02 MED ORDER — ENOXAPARIN SODIUM 40 MG/0.4ML ~~LOC~~ SOLN
40.0000 mg | SUBCUTANEOUS | Status: DC
  Filled 2016-07-02: qty 0.4

## 2016-07-02 MED ORDER — DEXTROSE 5 % IV SOLN
500.0000 mg | INTRAVENOUS | Status: DC
  Administered 2016-07-02 – 2016-07-03 (×2): 500 mg via INTRAVENOUS
  Filled 2016-07-02 (×4): qty 500

## 2016-07-02 MED ORDER — POTASSIUM CHLORIDE CRYS ER 20 MEQ PO TBCR
40.0000 meq | EXTENDED_RELEASE_TABLET | Freq: Once | ORAL | Status: DC
  Filled 2016-07-02: qty 2

## 2016-07-02 MED ORDER — PANTOPRAZOLE SODIUM 40 MG IV SOLR
40.0000 mg | Freq: Two times a day (BID) | INTRAVENOUS | Status: DC
  Administered 2016-07-02 – 2016-07-04 (×5): 40 mg via INTRAVENOUS
  Filled 2016-07-02 (×5): qty 40

## 2016-07-02 MED ORDER — DEXTROSE-NACL 5-0.45 % IV SOLN
INTRAVENOUS | Status: DC
  Administered 2016-07-02: 13:00:00 via INTRAVENOUS
  Administered 2016-07-03: 1000 mL via INTRAVENOUS

## 2016-07-02 NOTE — Progress Notes (Signed)
PROGRESS NOTE    Pedro [name]  JKD:[id number] DOB: 07/10/1995 DOA: 07/01/2016 PCP: Chevis Pretty, FNP    Brief Narrative: Pedro [name] is an 21 y.o. male  with multiple medical problems including type 1 diabetes with frequent DKA,  last admission for DKA 1 month ago, presents to the emergency room with abdominal pain and pain nausea and vomiting and found to be in DKA and bicarbonate of 7 and AG of 41. Further evaluation included negative CXR , blood glucose greater than 1000, creatinine of 3.0    and potassium of 5.7 . Patient is alert and oriented and able to give a good history. There has been no fever, chills, cough, dysuria. Hospitalist was asked to admit her for DKA.  He doesn't have insurance, and has been cutting down on his insulin due to cost.   Assessment & Plan:   Principal Problem:   DM type 1 (diabetes mellitus, type 1) ([place]) Active Problems:   DKA, type 1 ([place])   Noncompliance with medications   1-DKA; Presents with acidosis gap 41, bicarb 7. Glucose 1166.  He was transition to Lantus on 4-27. Gap opening and bicarb trending down at 15. Will resume insulin Gtt, IV fluids.  Will repeat chest  Xray after hydration, check LFT.  B-met every 4 hours.  DM with poor controlled, Hb-A1c 10.   2-AKI;  In setting of DKA, hypovolemia;  Continue with IV fluids.  Cr peak to 3. Cr has decreased to 1.0  3-Hematuria, back, flank pain.  renal US negative for stone, hydronephrosis.  Lumbar spine x ray only showed mild space disk narrowing L 5-S 1.   4-Leukocytosis Trending down.  Follow blood culture: no growth to date, urine culture pending.  Repeat chest x ray.       DVT prophylaxis: lovenox Code Status: Full code.  Family Communication: care discussed with patient.  Disposition Plan: remain in the step down unit. Resume insulin Gtt.    Consultants:   none   Procedures: renal US.    Antimicrobials: none   Subjective: He is having heart burn,  and nausea. Back pain is better. Not feeling well.   Objective: Vitals:   07/02/16 0400 07/02/16 0500 07/02/16 0600 07/02/16 0715  BP: 121/69 121/66 129/85   Pulse: 96 99 99 (!) 114  Resp: '16 17 15 '$ (!) 22  Temp: 97.7 F (36.5 C)   97.9 F (36.6 C)  TempSrc: Oral   Oral  SpO2: 98% 99% 99% 98%  Weight: 61.3 kg (135 lb 2.3 oz)     Height:        Intake/Output Summary (Last 24 hours) at 07/02/16 0733 Last data filed at 07/02/16 0715  Gross per 24 hour  Intake          1273.16 ml  Output             1200 ml  Net            73.16 ml   Filed Weights   07/01/16 0236 07/01/16 0620 07/02/16 0400  Weight: 63.5 kg (140 lb) 59.9 kg (132 lb 0.9 oz) 61.3 kg (135 lb 2.3 oz)    Examination:  General exam: NAD Respiratory system: Clear to auscultation. Respiratory effort normal. Cardiovascular system: S1 & S2 heard, RRR. No JVD, murmurs, rubs, gallops or clicks. No pedal edema. Gastrointestinal system: Abdomen is nondistended, soft and nontender. No organomegaly or masses felt. Normal bowel sounds heard. No significant flank pain.  Central nervous system: no  focal deficit  Extremities; no edema Skin; no rash  Psychiatry: Judgement and insight appear normal. Mood & affect appropriate.     Data Reviewed: I have personally reviewed following labs and imaging studies  CBC:  Recent Labs Lab 07/01/16 0240 07/01/16 0638 07/02/16 0432  WBC 36.1* 29.2* 19.0*  NEUTROABS 28.9*  --   --   HGB 16.5 14.7 12.9*  HCT 54.6* 43.6 37.6*  MCV 98.2 87.9 85.8  PLT 378 301 086   Basic Metabolic Panel:  Recent Labs Lab 07/01/16 0638 07/01/16 1017 07/01/16 1415 07/01/16 1926 07/02/16 0432  NA 133* 136 135 137 137  K 4.4 3.7 3.9 3.5 3.8  CL 100* 106 109 110 108  CO2 9* 17* 20* 19* 15*  GLUCOSE 544* 243* 161* 140* 263*  BUN 61* 57* 54* 51* 43*  CREATININE 2.11* 1.54* 1.22 1.12 1.03  CALCIUM 7.2* 7.3* 7.5* 7.8* 8.3*  MG 1.9  --   --   --   --    GFR: Estimated Creatinine Clearance:  97.5 mL/min (by C-G formula based on SCr of 1.03 mg/dL). Liver Function Tests: No results for input(s): AST, ALT, ALKPHOS, BILITOT, PROT, ALBUMIN in the last 168 hours. No results for input(s): LIPASE, AMYLASE in the last 168 hours. No results for input(s): AMMONIA in the last 168 hours. Coagulation Profile: No results for input(s): INR, PROTIME in the last 168 hours. Cardiac Enzymes: No results for input(s): CKTOTAL, CKMB, CKMBINDEX, TROPONINI in the last 168 hours. BNP (last 3 results) No results for input(s): PROBNP in the last 8760 hours. HbA1C:  Recent Labs  07/01/16 0243  HGBA1C 10.3*   CBG:  Recent Labs Lab 07/01/16 1546 07/01/16 1631 07/01/16 1732 07/01/16 2149 07/02/16 0714  GLUCAP 158* 167* 163* 135* 315*   Lipid Profile: No results for input(s): CHOL, HDL, LDLCALC, TRIG, CHOLHDL, LDLDIRECT in the last 72 hours. Thyroid Function Tests: No results for input(s): TSH, T4TOTAL, FREET4, T3FREE, THYROIDAB in the last 72 hours. Anemia Panel: No results for input(s): VITAMINB12, FOLATE, FERRITIN, TIBC, IRON, RETICCTPCT in the last 72 hours. Sepsis Labs: No results for input(s): PROCALCITON, LATICACIDVEN in the last 168 hours.  Recent Results (from the past 240 hour(s))  MRSA PCR Screening     Status: None   Collection Time: 07/01/16  6:08 AM  Result Value Ref Range Status   MRSA by PCR NEGATIVE NEGATIVE Final    Comment:        The GeneXpert MRSA Assay (FDA approved for NASAL specimens only), is one component of a comprehensive MRSA colonization surveillance program. It is not intended to diagnose MRSA infection nor to guide or monitor treatment for MRSA infections.   Culture, blood (routine x 2)     Status: None (Preliminary result)   Collection Time: 07/01/16 10:17 AM  Result Value Ref Range Status   Specimen Description BLOOD RIGHT ARM  Final   Special Requests   Final    BOTTLES DRAWN AEROBIC AND ANAEROBIC Blood Culture adequate volume   Culture NO  GROWTH < 24 HOURS  Final   Report Status PENDING  Incomplete  Culture, blood (routine x 2)     Status: None (Preliminary result)   Collection Time: 07/01/16 10:29 AM  Result Value Ref Range Status   Specimen Description BLOOD RIGHT FOREARM  Final   Special Requests   Final    BOTTLES DRAWN AEROBIC ONLY Blood Culture adequate volume   Culture NO GROWTH < 24 HOURS  Final   Report Status  PENDING  Incomplete         Radiology Studies: Dg Lumbar Spine 2-3 Views  Result Date: 07/01/2016 CLINICAL DATA:  Three months of low back pain without known injury EXAM: LUMBAR SPINE - 2-3 VIEW COMPARISON:  08/20/2010 FINDINGS: There is no evidence of lumbar spine fracture. Alignment is normal. Slight relative disc space narrowing at L5-S1. No spondylolysis nor spondylolisthesis. IMPRESSION: There is relative mild disc space narrowing at L5-S1. This appears stable since 2012 comparisons. No acute osseous appearing abnormality. Electronically Signed   By: Ashley Royalty M.D.   On: 07/01/2016 23:30   US Renal  Result Date: 07/01/2016 CLINICAL DATA:  Hematuria EXAM: RENAL / URINARY TRACT ULTRASOUND COMPLETE COMPARISON:  None. FINDINGS: Right Kidney: Length: 13.3 cm. Echogenicity within normal limits. No mass or hydronephrosis visualized. Left Kidney: Length: 11.9 cm. Echogenicity within normal limits. No mass or hydronephrosis visualized. Bladder: Appears normal for degree of bladder distention. IMPRESSION: Negative renal ultrasound Electronically Signed   By: Donavan Foil M.D.   On: 07/01/2016 14:21   Dg Chest Port 1 View  Result Date: 07/01/2016 CLINICAL DATA:  21 year old male with chest pain. EXAM: PORTABLE CHEST 1 VIEW COMPARISON:  Chest radiograph dated 02/08/2015 FINDINGS: The heart size and mediastinal contours are within normal limits. Both lungs are clear. The visualized skeletal structures are unremarkable. IMPRESSION: No active disease. Electronically Signed   By: Anner Crete M.D.   On:  07/01/2016 03:43        Scheduled Meds: . pantoprazole (PROTONIX) IV  40 mg Intravenous Q12H   Continuous Infusions: . sodium chloride    . sodium chloride    . dextrose 5 % and 0.45% NaCl    . insulin (NOVOLIN-R) infusion    . potassium chloride    . sodium chloride       LOS: 1 day    Time spent: 35 minutes.     Elmarie Shiley, MD [facility] Pager [phone]  If 7PM-7AM, please contact night-coverage [email or web address] Password [facility] 07/02/2016, 7:33 AM

## 2016-07-03 LAB — GLUCOSE, CAPILLARY
GLUCOSE-CAPILLARY: 192 mg/dL — AB (ref 65–99)
GLUCOSE-CAPILLARY: 172 mg/dL — AB (ref 65–99)
GLUCOSE-CAPILLARY: 184 mg/dL — AB (ref 65–99)
GLUCOSE-CAPILLARY: 176 mg/dL — AB (ref 65–99)
GLUCOSE-CAPILLARY: 189 mg/dL — AB (ref 65–99)
GLUCOSE-CAPILLARY: 135 mg/dL — AB (ref 65–99)
Glucose-Capillary: 229 mg/dL — ABNORMAL HIGH (ref 65–99)
Glucose-Capillary: 215 mg/dL — ABNORMAL HIGH (ref 65–99)
Glucose-Capillary: 180 mg/dL — ABNORMAL HIGH (ref 65–99)
Glucose-Capillary: 150 mg/dL — ABNORMAL HIGH (ref 65–99)
Glucose-Capillary: 119 mg/dL — ABNORMAL HIGH (ref 65–99)
Glucose-Capillary: 124 mg/dL — ABNORMAL HIGH (ref 65–99)
Glucose-Capillary: 212 mg/dL — ABNORMAL HIGH (ref 65–99)
Glucose-Capillary: 202 mg/dL — ABNORMAL HIGH (ref 65–99)
Glucose-Capillary: 127 mg/dL — ABNORMAL HIGH (ref 65–99)
Glucose-Capillary: 133 mg/dL — ABNORMAL HIGH (ref 65–99)

## 2016-07-03 LAB — URINE CULTURE: Culture: NO GROWTH

## 2016-07-03 LAB — BASIC METABOLIC PANEL
ANION GAP: 7 (ref 5–15)
Anion gap: 5 (ref 5–15)
Anion gap: 5 (ref 5–15)
BUN: 26 mg/dL — ABNORMAL HIGH (ref 6–20)
BUN: 21 mg/dL — AB (ref 6–20)
BUN: 19 mg/dL (ref 6–20)
CALCIUM: 8.7 mg/dL — AB (ref 8.9–10.3)
CHLORIDE: 110 mmol/L (ref 101–111)
CHLORIDE: 107 mmol/L (ref 101–111)
CO2: 19 mmol/L — ABNORMAL LOW (ref 22–32)
CO2: 23 mmol/L (ref 22–32)
CO2: 24 mmol/L (ref 22–32)
CREATININE: 0.88 mg/dL (ref 0.61–1.24)
CREATININE: 0.77 mg/dL (ref 0.61–1.24)
CREATININE: 0.72 mg/dL (ref 0.61–1.24)
Calcium: 8.4 mg/dL — ABNORMAL LOW (ref 8.9–10.3)
Calcium: 8.4 mg/dL — ABNORMAL LOW (ref 8.9–10.3)
Chloride: 108 mmol/L (ref 101–111)
GFR calc Af Amer: >60 mL/min (ref >60)
GFR calc Af Amer: >60 mL/min (ref >60)
GFR calc non Af Amer: >60 mL/min (ref >60)
GFR calc non Af Amer: >60 mL/min (ref >60)
GLUCOSE: 150 mg/dL — AB (ref 65–99)
GLUCOSE: 148 mg/dL — AB (ref 65–99)
Glucose, Bld: 231 mg/dL — ABNORMAL HIGH (ref 65–99)
POTASSIUM: 3.4 mmol/L — AB (ref 3.5–5.1)
POTASSIUM: 3.1 mmol/L — AB (ref 3.5–5.1)
Potassium: 3.3 mmol/L — ABNORMAL LOW (ref 3.5–5.1)
SODIUM: 136 mmol/L (ref 135–145)
SODIUM: 135 mmol/L (ref 135–145)
Sodium: 137 mmol/L (ref 135–145)

## 2016-07-03 LAB — CBC
HCT: 36.1 % — ABNORMAL LOW (ref 39.0–52.0)
HEMOGLOBIN: 12.6 g/dL — AB (ref 13.0–17.0)
MCH: 29.7 pg (ref 26.0–34.0)
MCHC: 34.9 g/dL (ref 30.0–36.0)
MCV: 85.1 fL (ref 78.0–100.0)
PLATELETS: 217 10*3/uL (ref 150–400)
RBC: 4.24 MIL/uL (ref 4.22–5.81)
RDW: 14.4 % (ref 11.5–15.5)
WBC: 14 10*3/uL — ABNORMAL HIGH (ref 4.0–10.5)

## 2016-07-03 MED ORDER — SODIUM CHLORIDE 0.9 % IV BOLUS (SEPSIS)
500.0000 mL | Freq: Once | INTRAVENOUS | Status: AC
  Administered 2016-07-03: 500 mL via INTRAVENOUS

## 2016-07-03 MED ORDER — POTASSIUM CHLORIDE CRYS ER 20 MEQ PO TBCR
40.0000 meq | EXTENDED_RELEASE_TABLET | Freq: Once | ORAL | Status: DC
  Filled 2016-07-03: qty 2

## 2016-07-03 MED ORDER — INSULIN GLARGINE 100 UNIT/ML ~~LOC~~ SOLN
25.0000 [IU] | Freq: Every day | SUBCUTANEOUS | Status: DC
  Administered 2016-07-03 – 2016-07-04 (×2): 25 [IU] via SUBCUTANEOUS
  Filled 2016-07-03 (×3): qty 0.25

## 2016-07-03 MED ORDER — POTASSIUM CHLORIDE 10 MEQ/100ML IV SOLN
10.0000 meq | INTRAVENOUS | Status: AC
  Administered 2016-07-03 (×4): 10 meq via INTRAVENOUS
  Filled 2016-07-03: qty 100

## 2016-07-03 MED ORDER — INSULIN ASPART 100 UNIT/ML ~~LOC~~ SOLN
0.0000 [IU] | Freq: Three times a day (TID) | SUBCUTANEOUS | Status: DC
  Administered 2016-07-03: 1 [IU] via SUBCUTANEOUS
  Administered 2016-07-04: 2 [IU] via SUBCUTANEOUS

## 2016-07-03 MED ORDER — SODIUM CHLORIDE 0.9 % IV SOLN
INTRAVENOUS | Status: DC
  Administered 2016-07-03 – 2016-07-04 (×2): via INTRAVENOUS

## 2016-07-03 NOTE — Progress Notes (Signed)
PROGRESS NOTE    Pedro [name]  TIR:[id number] DOB: December 06, 1995 DOA: 07/01/2016 PCP: Chevis Pretty, FNP    Brief Narrative: Pedro [name] is an 21 y.o. male  with multiple medical problems including type 1 diabetes with frequent DKA,  last admission for DKA 1 month ago, presents to the emergency room with abdominal pain and pain nausea and vomiting and found to be in DKA and bicarbonate of 7 and AG of 41. Further evaluation included negative CXR , blood glucose greater than 1000, creatinine of 3.0    and potassium of 5.7 . Patient is alert and oriented and able to give a good history. There has been no fever, chills, cough, dysuria. Hospitalist was asked to admit her for DKA.  He doesn't have insurance, and has been cutting down on his insulin due to cost.   Assessment & Plan:   Principal Problem:   DM type 1 (diabetes mellitus, type 1) ([place]) Active Problems:   DKA, type 1 ([place])   Noncompliance with medications   1-DKA; DM type 1; Presents with acidosis gap 41, bicarb 7. Glucose 1166.  He was transition to Lantus on 4-27. Gap increased and bicarb trending down at 15. He is still on insulin Gtt.  Bicarb at 19, gap 7. Await next B-met if bicarb more than 21 will consider transition to lantus.  B-met every 4 hours.  DM with poor controlled, Hb-A1c 10.  Replete potasium.   2-AKI;  In setting of DKA, hypovolemia;  Continue with IV fluids.  Cr peak to 3. Cr has decreased to 1.0 Improved.   3-Hematuria, back, flank pain.  renal US negative for stone, hydronephrosis.  Lumbar spine x ray only showed mild space disk narrowing L 5-S 1.   4-Leukocytosis; Suspect related to DKA and PNA.  Trending down.  Follow blood culture: no growth to date, urine culture pending.  Chest xray consistent with PNA.   5-PNA: Left base.  Chest x ray: Airspace consolidation consistent with pneumonia medial left base. Continue with Ceftriaxone and Azithromycin day 2.  6-Hypokalemia; replete  IV.  7-Screening HIV negative.    DVT prophylaxis: lovenox Code Status: Full code.  Family Communication: care discussed with patient.  Disposition Plan: remain in the step down unit. Resume insulin Gtt.    Consultants:   none   Procedures: renal US.    Antimicrobials:  Ceftriaxone 4-28  Azithromycin 4-28   Subjective: He is feeling better, wiling to eat.  Would like to have shower.   Objective: Vitals:   07/03/16 0400 07/03/16 0500 07/03/16 0600 07/03/16 0745  BP: (!) 132/92 128/89 (!) 134/94   Pulse:      Resp: _0 Temp: 98 F (36.7 C)   98.6 F (37 C)  TempSrc: Oral   Oral  SpO2:      Weight:  60.4 kg (133 lb 2.5 oz)    Height:        Intake/Output Summary (Last 24 hours) at 07/03/16 0748 Last data filed at 07/03/16 0645  Gross per 24 hour  Intake           4488.3 ml  Output             1700 ml  Net           2788.3 ml   Filed Weights   07/01/16 0620 07/02/16 0400 07/03/16 0500  Weight: 59.9 kg (132 lb 0.9 oz) 61.3 kg (135 lb 2.3 oz) 60.4 kg (133 lb 2.5  oz)    Examination:  General exam: NAD Respiratory system: clear to auscultation  Cardiovascular system:  S 1, S 2 RRR Gastrointestinal system: BS presents, soft.  Central nervous system: no focal deficit  Extremities; no edema Skin; no rash  Psychiatry: Judgement and insight appear normal. Mood & affect appropriate.     Data Reviewed: I have personally reviewed following labs and imaging studies  CBC:  Recent Labs Lab 07/01/16 0240 07/01/16 0638 07/02/16 0432 07/03/16 0422  WBC 36.1* 29.2* 19.0* 14.0*  NEUTROABS 28.9*  --   --   --   HGB 16.5 14.7 12.9* 12.6*  HCT 54.6* 43.6 37.6* 36.1*  MCV 98.2 87.9 85.8 85.1  PLT 378 301 263 761   Basic Metabolic Panel:  Recent Labs Lab 07/01/16 0638  07/02/16 1113 07/02/16 1532 07/02/16 1926 07/02/16 2320 07/03/16 0422  NA 133*  < > 141 138 138 137 136  K 4.4  < > 3.9 3.6 3.4* 3.6 3.3*  CL 100*  < > 113* 112* 110 110 110   CO2 9*  < > 15* 18* 19* 17* 19*  GLUCOSE 544*  < > 287* 169* 111* 154* 231*  BUN 61*  < > 39* 35* 32* 29* 26*  CREATININE 2.11*  < > 1.21 1.05 0.89 0.88 0.88  CALCIUM 7.2*  < > 8.8* 8.5* 8.5* 8.6* 8.7*  MG 1.9  --   --   --   --   --   --   < > = values in this interval not displayed. GFR: Estimated Creatinine Clearance: 112.5 mL/min (by C-G formula based on SCr of 0.88 mg/dL). Liver Function Tests:  Recent Labs Lab 07/02/16 0740  AST 20  ALT 16*  ALKPHOS 55  BILITOT 1.6*  PROT 5.0*  ALBUMIN 2.9*   No results for input(s): LIPASE, AMYLASE in the last 168 hours. No results for input(s): AMMONIA in the last 168 hours. Coagulation Profile: No results for input(s): INR, PROTIME in the last 168 hours. Cardiac Enzymes: No results for input(s): CKTOTAL, CKMB, CKMBINDEX, TROPONINI in the last 168 hours. BNP (last 3 results) No results for input(s): PROBNP in the last 8760 hours. HbA1C:  Recent Labs  07/01/16 0243  HGBA1C 10.3*   CBG:  Recent Labs Lab 07/03/16 0302 07/03/16 0400 07/03/16 0505 07/03/16 0633 07/03/16 0737  GLUCAP 229* 215* 180* 184* 150*   Lipid Profile: No results for input(s): CHOL, HDL, LDLCALC, TRIG, CHOLHDL, LDLDIRECT in the last 72 hours. Thyroid Function Tests: No results for input(s): TSH, T4TOTAL, FREET4, T3FREE, THYROIDAB in the last 72 hours. Anemia Panel: No results for input(s): VITAMINB12, FOLATE, FERRITIN, TIBC, IRON, RETICCTPCT in the last 72 hours. Sepsis Labs: No results for input(s): PROCALCITON, LATICACIDVEN in the last 168 hours.  Recent Results (from the past 240 hour(s))  MRSA PCR Screening     Status: None   Collection Time: 07/01/16  6:08 AM  Result Value Ref Range Status   MRSA by PCR NEGATIVE NEGATIVE Final    Comment:        The GeneXpert MRSA Assay (FDA approved for NASAL specimens only), is one component of a comprehensive MRSA colonization surveillance program. It is not intended to diagnose MRSA infection nor  to guide or monitor treatment for MRSA infections.   Culture, blood (routine x 2)     Status: None (Preliminary result)   Collection Time: 07/01/16 10:17 AM  Result Value Ref Range Status   Specimen Description BLOOD RIGHT ARM  Final  Special Requests   Final    BOTTLES DRAWN AEROBIC AND ANAEROBIC Blood Culture adequate volume   Culture NO GROWTH < 24 HOURS  Final   Report Status PENDING  Incomplete  Culture, blood (routine x 2)     Status: None (Preliminary result)   Collection Time: 07/01/16 10:29 AM  Result Value Ref Range Status   Specimen Description BLOOD RIGHT FOREARM  Final   Special Requests   Final    BOTTLES DRAWN AEROBIC ONLY Blood Culture adequate volume   Culture NO GROWTH < 24 HOURS  Final   Report Status PENDING  Incomplete         Radiology Studies: Dg Lumbar Spine 2-3 Views  Result Date: 07/01/2016 CLINICAL DATA:  Three months of low back pain without known injury EXAM: LUMBAR SPINE - 2-3 VIEW COMPARISON:  08/20/2010 FINDINGS: There is no evidence of lumbar spine fracture. Alignment is normal. Slight relative disc space narrowing at L5-S1. No spondylolysis nor spondylolisthesis. IMPRESSION: There is relative mild disc space narrowing at L5-S1. This appears stable since 2012 comparisons. No acute osseous appearing abnormality. Electronically Signed   By: Ashley Royalty M.D.   On: 07/01/2016 23:30   US Renal  Result Date: 07/01/2016 CLINICAL DATA:  Hematuria EXAM: RENAL / URINARY TRACT ULTRASOUND COMPLETE COMPARISON:  None. FINDINGS: Right Kidney: Length: 13.3 cm. Echogenicity within normal limits. No mass or hydronephrosis visualized. Left Kidney: Length: 11.9 cm. Echogenicity within normal limits. No mass or hydronephrosis visualized. Bladder: Appears normal for degree of bladder distention. IMPRESSION: Negative renal ultrasound Electronically Signed   By: Donavan Foil M.D.   On: 07/01/2016 14:21   Dg Chest Port 1 View  Result Date: 07/02/2016 CLINICAL DATA:   Leukocytosis EXAM: PORTABLE CHEST 1 VIEW COMPARISON:  July 01, 2016 FINDINGS: There is airspace consolidation in the medial left base. There is atelectatic change in the right base. Lungs elsewhere clear. Heart size and pulmonary vascularity are normal. No adenopathy. No bone lesions. IMPRESSION: Airspace consolidation consistent with pneumonia medial left base. Patchy atelectasis right base. Lungs elsewhere clear. Cardiac silhouette within normal limits. Electronically Signed   By: Lowella Grip III M.D.   On: 07/02/2016 08:18        Scheduled Meds: . enoxaparin (LOVENOX) injection  40 mg Subcutaneous Q24H  . pantoprazole (PROTONIX) IV  40 mg Intravenous Q12H   Continuous Infusions: . sodium chloride 125 mL/hr at 07/02/16 1230  . azithromycin Stopped (07/02/16 1554)  . cefTRIAXone (ROCEPHIN)  IV Stopped (07/02/16 1524)  . dextrose 5 % and 0.45% NaCl 125 mL/hr at 07/03/16 0645  . insulin (NOVOLIN-R) infusion 5 Units/hr (07/03/16 0645)  . sodium chloride       LOS: 2 days    Time spent: 35 minutes.     Elmarie Shiley, MD [facility] Pager [phone]  If 7PM-7AM, please contact night-coverage [email or web address] Password [facility] 07/03/2016, 7:48 AM

## 2016-07-04 DIAGNOSIS — J189 Pneumonia, unspecified organism: Secondary | ICD-10-CM

## 2016-07-04 LAB — BASIC METABOLIC PANEL
ANION GAP: 5 (ref 5–15)
BUN: 15 mg/dL (ref 6–20)
CHLORIDE: 105 mmol/L (ref 101–111)
CO2: 28 mmol/L (ref 22–32)
Calcium: 8.6 mg/dL — ABNORMAL LOW (ref 8.9–10.3)
Creatinine, Ser: 0.8 mg/dL (ref 0.61–1.24)
Glucose, Bld: 167 mg/dL — ABNORMAL HIGH (ref 65–99)
POTASSIUM: 3.6 mmol/L (ref 3.5–5.1)
SODIUM: 138 mmol/L (ref 135–145)

## 2016-07-04 LAB — CBC
HEMATOCRIT: 39.6 % (ref 39.0–52.0)
HEMOGLOBIN: 13.7 g/dL (ref 13.0–17.0)
MCH: 29.7 pg (ref 26.0–34.0)
MCHC: 34.6 g/dL (ref 30.0–36.0)
MCV: 85.9 fL (ref 78.0–100.0)
Platelets: 223 10*3/uL (ref 150–400)
RBC: 4.61 MIL/uL (ref 4.22–5.81)
RDW: 13.9 % (ref 11.5–15.5)
WBC: 9.7 10*3/uL (ref 4.0–10.5)

## 2016-07-04 LAB — GLUCOSE, CAPILLARY: GLUCOSE-CAPILLARY: 176 mg/dL — AB (ref 65–99)

## 2016-07-04 MED ORDER — AMOXICILLIN 500 MG PO CAPS: 500.0000 mg | ORAL_CAPSULE | Freq: Three times a day (TID) | ORAL | 0 refills | Status: DC

## 2016-07-04 MED ORDER — GABAPENTIN 800 MG PO TABS: 800.0000 mg | ORAL_TABLET | Freq: Three times a day (TID) | ORAL | 0 refills | Status: DC

## 2016-07-04 MED ORDER — INSULIN ASPART 100 UNIT/ML ~~LOC~~ SOLN: 0.0000 [IU] | Freq: Three times a day (TID) | SUBCUTANEOUS | 6 refills | Status: DC

## 2016-07-04 MED ORDER — INSULIN GLARGINE 100 UNIT/ML ~~LOC~~ SOLN: 25.0000 [IU] | Freq: Every day | SUBCUTANEOUS | 6 refills | Status: DC

## 2016-07-04 MED ORDER — AZITHROMYCIN 500 MG PO TABS: 500.0000 mg | ORAL_TABLET | Freq: Every day | ORAL | 0 refills | Status: DC

## 2016-07-04 MED ORDER — GABAPENTIN 400 MG PO CAPS
800.0000 mg | ORAL_CAPSULE | Freq: Three times a day (TID) | ORAL | Status: DC
  Administered 2016-07-04: 800 mg via ORAL
  Filled 2016-07-04: qty 2

## 2016-07-04 NOTE — Discharge Summary (Signed)
Physician Discharge Summary  Pedro [name] RKY:[id number] DOB: 12-19-1995 DOA: 07/01/2016  PCP: Pedro Pretty, FNP  Admit date: 07/01/2016 Discharge date: 07/04/2016  Admitted From: Home  Disposition: Home  Recommendations for Outpatient Follow-up:  1. Follow up with PCP in 1-2 weeks 2. Please obtain BMP/CBC in one week    Discharge Condition: stable.  CODE STATUS: full code.  Diet recommendation: Carb Modified  Brief/Interim Summary: Pedro Carbon Sheltonis an 21 y.o.malewith multiple medical problems including type 1 diabetes with frequent DKA, last admission for DKA 1 month ago, presents to the emergency room with abdominal pain and pain nausea and vomiting and found to be in DKA and bicarbonate of 7 and AG of 41. Further evaluation included negative CXR, blood glucose greater than 1000, creatinine of 3.0and potassium of 5.7. Patient is alert and oriented and able to give a good history. There has been no fever, chills, cough, dysuria. Hospitalist was asked to admit her for DKA. He doesn't have insurance, and has been cutting down on his insulin due to cost.   Assessment & Plan:  1-DKA; DM type 1; Presents with acidosis gap 41, bicarb 7. Glucose 1166.  He was treated with IV fluids, insulin Gtt. He was transition to lantus and SSI. Gap close, bicarb above 22.  Suspect DKA related to PNA.  DM with poor controlled, Hb-A1c 10.    2-AKI;  In setting of DKA, hypovolemia;  Continue with IV fluids.  Cr peak to 3. Cr has decreased to 1.0 Resolved.   3-Hematuria, back, flank pain.  renal US negative for stone, hydronephrosis.  Lumbar spine x ray only showed mild space disk narrowing L 5-S 1.   4-Leukocytosis; Suspect related to DKA and PNA.  Trending down.  Follow blood culture: no growth to date, urine culture no growth.  Chest xray consistent with PNA.   5-PNA: Left base.  Chest x ray: Airspace consolidation consistent with pneumonia medial left  base. Treated with  Ceftriaxone and Azithromycin day 3 days.  Will provide amoxicillin and Zithromax at discharge.  WBC normalized.   6-Hypokalemia; replete IV.  7-Screening HIV negative.    Discharge Diagnoses:  Principal Problem:   DM type 1 (diabetes mellitus, type 1) ([place]) Active Problems:   DKA, type 1 ([place])   Noncompliance with medications   PNA (pneumonia)    Discharge Instructions  Discharge Instructions    Diet Carb Modified    Complete by:  As directed    Increase activity slowly    Complete by:  As directed      Allergies as of 07/04/2016      Reactions   Sulfa Antibiotics Hives, Other (See Comments)   Reaction:  Fever      Medication List    STOP taking these medications   insulin NPH-regular Human (70-30) 100 UNIT/ML injection Commonly known as:  NOVOLIN 70/30     TAKE these medications   amoxicillin 500 MG capsule Commonly known as:  AMOXIL Take 1 capsule (500 mg total) by mouth 3 (three) times daily.   azithromycin 500 MG tablet Commonly known as:  ZITHROMAX Take 1 tablet (500 mg total) by mouth daily.   gabapentin 800 MG tablet Commonly known as:  NEURONTIN Take 1 tablet (800 mg total) by mouth 3 (three) times daily.   insulin aspart 100 UNIT/ML injection Commonly known as:  novoLOG Inject 0-15 Units into the skin 3 (three) times daily with meals.   insulin glargine 100 UNIT/ML injection Commonly known as:  LANTUS  Inject 0.25 mLs (25 Units total) into the skin daily.      Follow-up Information    [facility].   Contact information: Appointment on May 4th at 1030 for Primary care and follow up          Allergies  Allergen Reactions  . Sulfa Antibiotics Hives and Other (See Comments)    Reaction:  Fever     Consultations: none  Procedures/Studies: Dg Lumbar Spine 2-3 Views  Result Date: 07/01/2016 CLINICAL DATA:  Three months of low back pain without known injury EXAM: LUMBAR SPINE - 2-3 VIEW  COMPARISON:  08/20/2010 FINDINGS: There is no evidence of lumbar spine fracture. Alignment is normal. Slight relative disc space narrowing at L5-S1. No spondylolysis nor spondylolisthesis. IMPRESSION: There is relative mild disc space narrowing at L5-S1. This appears stable since 2012 comparisons. No acute osseous appearing abnormality. Electronically Signed   By: Ashley Royalty M.D.   On: 07/01/2016 23:30   US Renal  Result Date: 07/01/2016 CLINICAL DATA:  Hematuria EXAM: RENAL / URINARY TRACT ULTRASOUND COMPLETE COMPARISON:  None. FINDINGS: Right Kidney: Length: 13.3 cm. Echogenicity within normal limits. No mass or hydronephrosis visualized. Left Kidney: Length: 11.9 cm. Echogenicity within normal limits. No mass or hydronephrosis visualized. Bladder: Appears normal for degree of bladder distention. IMPRESSION: Negative renal ultrasound Electronically Signed   By: Donavan Foil M.D.   On: 07/01/2016 14:21   Dg Chest Port 1 View  Result Date: 07/02/2016 CLINICAL DATA:  Leukocytosis EXAM: PORTABLE CHEST 1 VIEW COMPARISON:  July 01, 2016 FINDINGS: There is airspace consolidation in the medial left base. There is atelectatic change in the right base. Lungs elsewhere clear. Heart size and pulmonary vascularity are normal. No adenopathy. No bone lesions. IMPRESSION: Airspace consolidation consistent with pneumonia medial left base. Patchy atelectasis right base. Lungs elsewhere clear. Cardiac silhouette within normal limits. Electronically Signed   By: Lowella Grip III M.D.   On: 07/02/2016 08:18   Dg Chest Port 1 View  Result Date: 07/01/2016 CLINICAL DATA:  21 year old male with chest pain. EXAM: PORTABLE CHEST 1 VIEW COMPARISON:  Chest radiograph dated 02/08/2015 FINDINGS: The heart size and mediastinal contours are within normal limits. Both lungs are clear. The visualized skeletal structures are unremarkable. IMPRESSION: No active disease. Electronically Signed   By: Anner Crete M.D.   On:  07/01/2016 03:43      Subjective: He is feeling better. He wants to be discharge on lantus. 70/30 has not been working for him.  Tolerating diet.    Discharge Exam: Vitals:   07/04/16 0400 07/04/16 0800  BP:    Pulse:    Resp:    Temp: 97.9 F (36.6 C) 97.9 F (36.6 C)   Vitals:   07/04/16 0000 07/04/16 0400 07/04/16 0500 07/04/16 0800  BP:      Pulse:      Resp:      Temp: 98 F (36.7 C) 97.9 F (36.6 C)  97.9 F (36.6 C)  TempSrc: Oral Oral  Oral  SpO2:      Weight:   58.7 kg (129 lb 6.6 oz)   Height:        General: Pt is alert, awake, not in acute distress Cardiovascular: RRR, S1/S2 +, no rubs, no gallops Respiratory: CTA bilaterally, no wheezing, no rhonchi Abdominal: Soft, NT, ND, bowel sounds + Extremities: no edema, no cyanosis    The results of significant diagnostics from this hospitalization (including imaging, microbiology, ancillary and laboratory) are  listed below for reference.     Microbiology: Recent Results (from the past 240 hour(s))  Urine culture     Status: None   Collection Time: 07/01/16  3:11 AM  Result Value Ref Range Status   Specimen Description URINE, CLEAN CATCH  Final   Special Requests NONE  Final   Culture   Final    NO GROWTH Performed at [facility], [address], [place], [place] [address]    Report Status 07/03/2016 FINAL  Final  MRSA PCR Screening     Status: None   Collection Time: 07/01/16  6:08 AM  Result Value Ref Range Status   MRSA by PCR NEGATIVE NEGATIVE Final    Comment:        The GeneXpert MRSA Assay (FDA approved for NASAL specimens only), is one component of a comprehensive MRSA colonization surveillance program. It is not intended to diagnose MRSA infection nor to guide or monitor treatment for MRSA infections.   Culture, blood (routine x 2)     Status: None (Preliminary result)   Collection Time: 07/01/16 10:17 AM  Result Value Ref Range Status   Specimen Description BLOOD RIGHT  ARM  Final   Special Requests   Final    BOTTLES DRAWN AEROBIC AND ANAEROBIC Blood Culture adequate volume   Culture NO GROWTH 3 DAYS  Final   Report Status PENDING  Incomplete  Culture, blood (routine x 2)     Status: None (Preliminary result)   Collection Time: 07/01/16 10:29 AM  Result Value Ref Range Status   Specimen Description BLOOD RIGHT FOREARM  Final   Special Requests   Final    BOTTLES DRAWN AEROBIC ONLY Blood Culture adequate volume   Culture NO GROWTH 3 DAYS  Final   Report Status PENDING  Incomplete     Labs: BNP (last 3 results) No results for input(s): BNP in the last 8760 hours. Basic Metabolic Panel:  Recent Labs Lab 07/01/16 0638  07/02/16 2320 07/03/16 0422 07/03/16 0948 07/03/16 1403 07/04/16 0426  NA 133*  < > 137 136 135 137 138  K 4.4  < > 3.6 3.3* 3.4* 3.1* 3.6  CL 100*  < > 110 110 107 108 105  CO2 9*  < > 17* 19* 23 24 28   GLUCOSE 544*  < > 154* 231* 150* 148* 167*  BUN 61*  < > 29* 26* 21* 19 15  CREATININE 2.11*  < > 0.88 0.88 0.77 0.72 0.80  CALCIUM 7.2*  < > 8.6* 8.7* 8.4* 8.4* 8.6*  MG 1.9  --   --   --   --   --   --   < > = values in this interval not displayed. Liver Function Tests:  Recent Labs Lab 07/02/16 0740  AST 20  ALT 16*  ALKPHOS 55  BILITOT 1.6*  PROT 5.0*  ALBUMIN 2.9*   No results for input(s): LIPASE, AMYLASE in the last 168 hours. No results for input(s): AMMONIA in the last 168 hours. CBC:  Recent Labs Lab 07/01/16 0240 07/01/16 0263 07/02/16 0432 07/03/16 0422 07/04/16 0426  WBC 36.1* 29.2* 19.0* 14.0* 9.7  NEUTROABS 28.9*  --   --   --   --   HGB 16.5 14.7 12.9* 12.6* 13.7  HCT 54.6* 43.6 37.6* 36.1* 39.6  MCV 98.2 87.9 85.8 85.1 85.9  PLT 378 301 263 217 223   Cardiac Enzymes: No results for input(s): CKTOTAL, CKMB, CKMBINDEX, TROPONINI in the last 168  hours. BNP: Invalid input(s): POCBNP CBG:  Recent Labs Lab 07/03/16 1402 07/03/16 1507 07/03/16 1602 07/03/16 2118 07/04/16 0731   GLUCAP 127* 189* 135* 133* 176*   D-Dimer No results for input(s): DDIMER in the last 72 hours. Hgb A1c No results for input(s): HGBA1C in the last 72 hours. Lipid Profile No results for input(s): CHOL, HDL, LDLCALC, TRIG, CHOLHDL, LDLDIRECT in the last 72 hours. Thyroid function studies No results for input(s): TSH, T4TOTAL, T3FREE, THYROIDAB in the last 72 hours.  Invalid input(s): FREET3 Anemia work up No results for input(s): VITAMINB12, FOLATE, FERRITIN, TIBC, IRON, RETICCTPCT in the last 72 hours. Urinalysis    Component Value Date/Time   COLORURINE YELLOW 07/01/2016 0311   APPEARANCEUR HAZY (A) 07/01/2016 0311   LABSPEC 1.018 07/01/2016 0311   PHURINE 5.0 07/01/2016 0311   GLUCOSEU >=500 (A) 07/01/2016 0311   HGBUR LARGE (A) 07/01/2016 0311   BILIRUBINUR NEGATIVE 07/01/2016 0311   BILIRUBINUR neg 04/16/2014 1752   KETONESUR 80 (A) 07/01/2016 0311   PROTEINUR NEGATIVE 07/01/2016 0311   UROBILINOGEN 0.2 10/17/2014 0743   NITRITE NEGATIVE 07/01/2016 0311   LEUKOCYTESUR NEGATIVE 07/01/2016 0311   Sepsis Labs Invalid input(s): PROCALCITONIN,  WBC,  LACTICIDVEN Microbiology Recent Results (from the past 240 hour(s))  Urine culture     Status: None   Collection Time: 07/01/16  3:11 AM  Result Value Ref Range Status   Specimen Description URINE, CLEAN CATCH  Final   Special Requests NONE  Final   Culture   Final    NO GROWTH Performed at [facility], [address], [place], [place] [address]    Report Status 07/03/2016 FINAL  Final  MRSA PCR Screening     Status: None   Collection Time: 07/01/16  6:08 AM  Result Value Ref Range Status   MRSA by PCR NEGATIVE NEGATIVE Final    Comment:        The GeneXpert MRSA Assay (FDA approved for NASAL specimens only), is one component of a comprehensive MRSA colonization surveillance program. It is not intended to diagnose MRSA infection nor to guide or monitor treatment for MRSA infections.   Culture, blood  (routine x 2)     Status: None (Preliminary result)   Collection Time: 07/01/16 10:17 AM  Result Value Ref Range Status   Specimen Description BLOOD RIGHT ARM  Final   Special Requests   Final    BOTTLES DRAWN AEROBIC AND ANAEROBIC Blood Culture adequate volume   Culture NO GROWTH 3 DAYS  Final   Report Status PENDING  Incomplete  Culture, blood (routine x 2)     Status: None (Preliminary result)   Collection Time: 07/01/16 10:29 AM  Result Value Ref Range Status   Specimen Description BLOOD RIGHT FOREARM  Final   Special Requests   Final    BOTTLES DRAWN AEROBIC ONLY Blood Culture adequate volume   Culture NO GROWTH 3 DAYS  Final   Report Status PENDING  Incomplete     Time coordinating discharge: Over 30 minutes  SIGNED:   Elmarie Shiley, MD  [facility] 07/04/2016, 11:51 AM Pager [phone]  If 7PM-7AM, please contact night-coverage [email or web address] Password [email or web address]

## 2016-07-04 NOTE — Progress Notes (Signed)
Inpatient Diabetes Program Recommendations  AACE/ADA: New Consensus Statement on Inpatient Glycemic Control (2015)  Target Ranges:  Prepandial:   less than 140 mg/dL      Peak postprandial:   less than 180 mg/dL (1-2 hours)      Critically ill patients:  140 - 180 mg/dL   Results for CAINE, BARFIELD (MRN [id number]) as of 07/04/2016 07:32  Ref. Range 07/03/2016 12:12 07/03/2016 12:59 07/03/2016 14:02 07/03/2016 15:07 07/03/2016 16:02 07/03/2016 21:18 07/04/2016 07:31  Glucose-Capillary Latest Ref Range: 65 - 99 mg/dL 202 (H) 176 (H) 127 (H) 189 (H) 135 (H) 133 (H) 176 (H)  Results for ATTICUS, WEDIN (MRN [id number]) as of 07/04/2016 07:32  Ref. Range 07/01/2016 02:43  Hemoglobin A1C Latest Ref Range: 4.8 - 5.6 % 10.3 (H)   Review of Glycemic Control  Diabetes history: DM1 Outpatient Diabetes medications: 70/30 20 units QAM, 70/30 15 units QPM Current orders for Inpatient glycemic control: Lantus 25 units daily, Novolog 0-9 units TID with meals  Inpatient Diabetes Program Recommendations:  Correction (SSI): Please consider ordering Novolog 0-5 units QHS for bedtime correction. Insulin - Meal Coverage: If patient is eating at least 50% of meals, please consider ordering Novolog 3 units TID with meals for meal coverage (in addition to Novolog correction). HgbA1C: A1C 10.3% on 07/01/16 indicating an average glucose of 249 mg/dl over the past 2-3 months.  Thanks, Pedro Alderman, RN, MSN, CDE Diabetes Coordinator Inpatient Diabetes Program [phone] (Team Pager from 8am to 5pm)

## 2016-07-04 NOTE — Progress Notes (Signed)
Discharge instructions and prescriptions given, verbalized understanding, out in stable condition ambulatory with staff. 

## 2016-07-04 NOTE — Care Management Note (Signed)
Case Management Note  Patient Details  Name: Pedro [name] MRN: [id number] Date of Birth: 1995/04/07  Subjective/Objective:     Adm with DKA. From home, ind with ADL's. No PCP.               Action/Plan: CM schedule appointment with [facility] in [place] on May 4th at 1030 for PCP and f/u. (clinic sees patient's for primary care)   Expected Discharge Date:  07/04/16               Expected Discharge Plan:  Home/Self Care  In-House Referral:     Discharge planning Services  CM Consult, Follow-up appt scheduled  Post Acute Care Choice:  NA Choice offered to:     DME Arranged:    DME Agency:     HH Arranged:    HH Agency:     Status of Service:  Completed, signed off  If discussed at [employer] of Stay Meetings, dates discussed:    Additional Comments:  Jesscia Imm, Chauncey Reading, RN 07/04/2016, 9:27 AM

## 2016-07-06 LAB — CULTURE, BLOOD (ROUTINE X 2)
CULTURE: NO GROWTH
Culture: NO GROWTH
Special Requests: ADEQUATE
Special Requests: ADEQUATE

## 2016-07-08 ENCOUNTER — Ambulatory Visit: Payer: Self-pay | Admitting: Family Medicine

## 2016-11-08 ENCOUNTER — Emergency Department (HOSPITAL_COMMUNITY)
Admission: EM | Admit: 2016-11-08 | Discharge: 2016-11-08 | Disposition: A | Discharge status: Home / Self Care | Payer: Self-pay | Attending: Emergency Medicine | Admitting: Emergency Medicine

## 2016-11-08 ENCOUNTER — Encounter (HOSPITAL_COMMUNITY): Payer: Self-pay | Admitting: Emergency Medicine

## 2016-11-08 DIAGNOSIS — F17210 Nicotine dependence, cigarettes, uncomplicated: Secondary | ICD-10-CM | POA: Insufficient documentation

## 2016-11-08 DIAGNOSIS — E1165 Type 2 diabetes mellitus with hyperglycemia: Secondary | ICD-10-CM | POA: Insufficient documentation

## 2016-11-08 DIAGNOSIS — E139 Other specified diabetes mellitus without complications: Secondary | ICD-10-CM

## 2016-11-08 DIAGNOSIS — Z794 Long term (current) use of insulin: Secondary | ICD-10-CM | POA: Insufficient documentation

## 2016-11-08 DIAGNOSIS — Z760 Encounter for issue of repeat prescription: Secondary | ICD-10-CM | POA: Insufficient documentation

## 2016-11-08 DIAGNOSIS — Z9114 Patient's other noncompliance with medication regimen: Secondary | ICD-10-CM | POA: Insufficient documentation

## 2016-11-08 DIAGNOSIS — E1140 Type 2 diabetes mellitus with diabetic neuropathy, unspecified: Secondary | ICD-10-CM | POA: Insufficient documentation

## 2016-11-08 DIAGNOSIS — R739 Hyperglycemia, unspecified: Secondary | ICD-10-CM

## 2016-11-08 LAB — URINALYSIS, ROUTINE W REFLEX MICROSCOPIC
BILIRUBIN URINE: NEGATIVE
Bacteria, UA: NONE SEEN
Glucose, UA: >=500 mg/dL — AB
Ketones, ur: 20 mg/dL — AB
Leukocytes, UA: NEGATIVE
NITRITE: NEGATIVE
PROTEIN: NEGATIVE mg/dL
Specific Gravity, Urine: 1.033 — ABNORMAL HIGH (ref 1.005–1.030)
pH: 6 (ref 5.0–8.0)

## 2016-11-08 LAB — CBC WITH DIFFERENTIAL/PLATELET
BASOS ABS: 0 10*3/uL (ref 0.0–0.1)
Basophils Relative: 0 %
EOS ABS: 0.1 10*3/uL (ref 0.0–0.7)
EOS PCT: 1 %
HCT: 48.4 % (ref 39.0–52.0)
Hemoglobin: 16 g/dL (ref 13.0–17.0)
Lymphocytes Relative: 26 %
Lymphs Abs: 2.6 10*3/uL (ref 0.7–4.0)
MCH: 29.9 pg (ref 26.0–34.0)
MCHC: 33.1 g/dL (ref 30.0–36.0)
MCV: 90.5 fL (ref 78.0–100.0)
Monocytes Absolute: 0.4 10*3/uL (ref 0.1–1.0)
Monocytes Relative: 4 %
Neutro Abs: 7 10*3/uL (ref 1.7–7.7)
Neutrophils Relative %: 69 %
PLATELETS: 332 10*3/uL (ref 150–400)
RBC: 5.35 MIL/uL (ref 4.22–5.81)
RDW: 12.4 % (ref 11.5–15.5)
WBC: 10.1 10*3/uL (ref 4.0–10.5)

## 2016-11-08 LAB — CBG MONITORING, ED
GLUCOSE-CAPILLARY: 292 mg/dL — AB (ref 65–99)
Glucose-Capillary: 425 mg/dL — ABNORMAL HIGH (ref 65–99)

## 2016-11-08 LAB — COMPREHENSIVE METABOLIC PANEL
ALT: 17 U/L (ref 17–63)
AST: 14 U/L — AB (ref 15–41)
Albumin: 4.4 g/dL (ref 3.5–5.0)
Alkaline Phosphatase: 72 U/L (ref 38–126)
Anion gap: 11 (ref 5–15)
BUN: 18 mg/dL (ref 6–20)
CHLORIDE: 96 mmol/L — AB (ref 101–111)
CO2: 25 mmol/L (ref 22–32)
CREATININE: 1 mg/dL (ref 0.61–1.24)
Calcium: 9.4 mg/dL (ref 8.9–10.3)
GFR calc non Af Amer: >60 mL/min (ref >60)
Glucose, Bld: 489 mg/dL — ABNORMAL HIGH (ref 65–99)
Potassium: 4.9 mmol/L (ref 3.5–5.1)
SODIUM: 132 mmol/L — AB (ref 135–145)
Total Bilirubin: 1.3 mg/dL — ABNORMAL HIGH (ref 0.3–1.2)
Total Protein: 7.6 g/dL (ref 6.5–8.1)

## 2016-11-08 MED ORDER — INSULIN GLARGINE 100 UNIT/ML ~~LOC~~ SOLN: 40.0000 [IU] | Freq: Every evening | SUBCUTANEOUS | 10 refills | Status: DC

## 2016-11-08 MED ORDER — SODIUM CHLORIDE 0.9 % IV BOLUS (SEPSIS)
2000.0000 mL | Freq: Once | INTRAVENOUS | Status: AC
  Administered 2016-11-08: 2000 mL via INTRAVENOUS

## 2016-11-08 MED ORDER — INSULIN ASPART 100 UNIT/ML ~~LOC~~ SOLN
10.0000 [IU] | Freq: Once | SUBCUTANEOUS | Status: AC
  Administered 2016-11-08: 10 [IU] via INTRAVENOUS
  Filled 2016-11-08: qty 1

## 2016-11-08 NOTE — ED Notes (Signed)
Pt ambulatory to waiting room. Pt verbalized understanding of discharge instructions.   

## 2016-11-08 NOTE — ED Triage Notes (Addendum)
Patient requesting refill in Lantus. Per patient last had Lantus 2 days ago. Patient states he has been using 70/30 but hasn't been working. Blood sugar in triage 474. Denies any nausea and vomiting. Patient "groggy" and tachycardiac in triage.

## 2016-11-08 NOTE — ED Provider Notes (Signed)
Emergency Department Provider Note   I have reviewed the triage vital signs and the nursing notes.   HISTORY  Chief Complaint Medication Refill   HPI KYI ROMANELLO is a 21 y.o. male here with hyperglycemia. Patient states that he has been out of his Lantus which she takes 40 units at bedtime for the last couple days and has progressively worsening nausea and abdominal pain consistent with previous episodes of hyperglycemia. States he tried taken 70/30 but this did not seem to help. No headache, chest pain, back pain or infectious symptoms.no other associated or modifying symptoms.   Past Medical History:  Diagnosis Date  . ADHD (attention deficit hyperactivity disorder)   . DM type 1 (diabetes mellitus, type 1) ([place])    diagnosed age 20  . Herpes   . Noncompliance with medications     Patient Active Problem List   Diagnosis Date Noted  . PNA (pneumonia) 07/04/2016  . DM type 1 (diabetes mellitus, type 1) ([place]) 07/01/2016  . Noncompliance with medications 07/01/2016  . Polysubstance abuse   . Tachycardia 10/17/2014  . Acute renal failure ([place]) 10/17/2014  . DKA (diabetic ketoacidoses) ([place]) 03/19/2014  . Diabetic neuropathy ([place]) 03/19/2014  . Dehydration   . Gastroenteritis 02/16/2014  . Diabetic ketoacidosis without coma associated with type 1 diabetes mellitus ([place])   . Genital herpes simplex type 2 12/15/2013  . Tobacco abuse 12/15/2013  . DKA, type 1 ([place]) 12/15/2013  . Diabetic ketoacidosis ([place]) 10/05/2013  . Hyperkalemia 10/15/2012  . Leukocytosis 10/15/2012  . Noncompliance 10/15/2012  . Polysubstance dependence ([place]) 07/22/2011    Class: Acute  . Substance induced mood disorder ([place]) 07/22/2011    Class: Acute    Past Surgical History:  Procedure Laterality Date  . none      Current Outpatient Rx  . Order #: [id number] Class: Print  . Order #: [id number] Class: Historical Med  . Order #: [id number] Class: Print    Allergies Sulfa  antibiotics  Family History  Problem Relation Age of Onset  . Diabetes Mellitus I Paternal Grandmother   . Diabetes Mellitus I Paternal Aunt     Social History Social History  Substance Use Topics  . Smoking status: Current Every Day Smoker    Packs/day: 0.50    Years: 2.00    Types: Cigarettes  . Smokeless tobacco: Never Used  . Alcohol use No    Review of Systems  All other systems negative except as documented in the HPI. All pertinent positives and negatives as reviewed in the HPI. ____________________________________________  PHYSICAL EXAM:  VITAL SIGNS: ED Triage Vitals  Enc Vitals Group     BP 11/08/16 1932 (!) 109/94     Pulse Rate 11/08/16 1932 (S) (!) 136     Resp 11/08/16 1932 18     Temp 11/08/16 1932 97.7 F (36.5 C)     Temp Source 11/08/16 1932 Oral     SpO2 11/08/16 1932 100 %     Weight 11/08/16 1932 145 lb (65.8 kg)     Height 11/08/16 1932 5\' 11"  (1.803 m)     Head Circumference --      Peak Flow --      Pain Score 11/08/16 1931 6     Pain Loc --      Pain Edu? --      Excl. in [place]? --     Constitutional: Alert and oriented. Well appearing and in no acute distress. Eyes: Conjunctivae are normal. PERRL. EOMI. Head: Atraumatic.  Nose: No congestion/rhinnorhea. Mouth/Throat: Mucous membranes are moist.  Oropharynx non-erythematous. Neck: No stridor.  No meningeal signs.   Cardiovascular: tachycardic in triage but now has a Normal rate and regular rhythm. Good peripheral circulation. Grossly normal heart sounds.   Respiratory: Normal respiratory effort.  No retractions. Lungs CTAB. Gastrointestinal: Soft and nontender. No distention.  Musculoskeletal: No lower extremity tenderness nor edema. No gross deformities of extremities. Neurologic:  Normal speech and language. No gross focal neurologic deficits are appreciated.  Skin:  Skin is warm, dry and intact. No rash noted.  ____________________________________________   LABS (all labs ordered  are listed, but only abnormal results are displayed)  Labs Reviewed  URINALYSIS, ROUTINE W REFLEX MICROSCOPIC - Abnormal; Notable for the following:       Result Value   Color, Urine STRAW (*)    Specific Gravity, Urine 1.033 (*)    Glucose, UA >=500 (*)    Hgb urine dipstick MODERATE (*)    Ketones, ur 20 (*)    Squamous Epithelial / LPF 0-5 (*)    All other components within normal limits  COMPREHENSIVE METABOLIC PANEL - Abnormal; Notable for the following:    Sodium 132 (*)    Chloride 96 (*)    Glucose, Bld 489 (*)    AST 14 (*)    Total Bilirubin 1.3 (*)    All other components within normal limits  CBG MONITORING, ED - Abnormal; Notable for the following:    Glucose-Capillary 425 (*)    All other components within normal limits  CBG MONITORING, ED - Abnormal; Notable for the following:    Glucose-Capillary 292 (*)    All other components within normal limits  CBC WITH DIFFERENTIAL/PLATELET  BLOOD GAS, VENOUS    ____________________________________________   PROCEDURES  Procedure(s) performed:   Procedures ____________________________________________   INITIAL IMPRESSION / ASSESSMENT AND PLAN / ED COURSE  Pertinent labs & imaging results that were available during my care of the patient were reviewed by me and considered in my medical decision making (see chart for details).  Hyperglyecmia 2/2 noncompliance bc of financial issues. No DKA. Refill for insulin given. Needs to get pcp follow up. Care management consutled to help with getting follow up and medications.  ____________________________________________  FINAL CLINICAL IMPRESSION(S) / ED DIAGNOSES  Final diagnoses:  Other specified diabetes mellitus without complication, with long-term current use of insulin ([place])  Hyperglycemia    MEDICATIONS GIVEN DURING THIS VISIT:  Medications  sodium chloride 0.9 % bolus 2,000 mL (0 mLs Intravenous Stopped 11/08/16 2305)  insulin aspart (novoLOG) injection 10  Units (10 Units Intravenous Given 11/08/16 2049)    NEW OUTPATIENT MEDICATIONS STARTED DURING THIS VISIT:  Discharge Medication List as of 11/08/2016 11:01 PM      Note:  This document was prepared using Dragon voice recognition software and may include unintentional dictation errors.    Merrily Pew, MD 11/08/16 2352

## 2016-11-09 ENCOUNTER — Inpatient Hospital Stay (HOSPITAL_COMMUNITY): Payer: Self-pay

## 2016-11-09 ENCOUNTER — Inpatient Hospital Stay (HOSPITAL_COMMUNITY)
Admission: EM | Admit: 2016-11-09 | Discharge: 2016-11-10 | DRG: 639 | Disposition: A | Discharge status: Home / Self Care | Payer: Self-pay | Attending: Internal Medicine | Admitting: Internal Medicine

## 2016-11-09 ENCOUNTER — Encounter (HOSPITAL_COMMUNITY): Payer: Self-pay | Admitting: Emergency Medicine

## 2016-11-09 DIAGNOSIS — Z882 Allergy status to sulfonamides status: Secondary | ICD-10-CM

## 2016-11-09 DIAGNOSIS — Z794 Long term (current) use of insulin: Secondary | ICD-10-CM

## 2016-11-09 DIAGNOSIS — E1110 Type 2 diabetes mellitus with ketoacidosis without coma: Secondary | ICD-10-CM | POA: Diagnosis present

## 2016-11-09 DIAGNOSIS — F909 Attention-deficit hyperactivity disorder, unspecified type: Secondary | ICD-10-CM | POA: Diagnosis present

## 2016-11-09 DIAGNOSIS — Z9114 Patient's other noncompliance with medication regimen: Secondary | ICD-10-CM

## 2016-11-09 DIAGNOSIS — E1140 Type 2 diabetes mellitus with diabetic neuropathy, unspecified: Secondary | ICD-10-CM | POA: Diagnosis present

## 2016-11-09 DIAGNOSIS — D72823 Leukemoid reaction: Secondary | ICD-10-CM | POA: Diagnosis present

## 2016-11-09 DIAGNOSIS — E1040 Type 1 diabetes mellitus with diabetic neuropathy, unspecified: Secondary | ICD-10-CM | POA: Diagnosis present

## 2016-11-09 DIAGNOSIS — E860 Dehydration: Secondary | ICD-10-CM | POA: Diagnosis present

## 2016-11-09 DIAGNOSIS — F17210 Nicotine dependence, cigarettes, uncomplicated: Secondary | ICD-10-CM | POA: Diagnosis present

## 2016-11-09 DIAGNOSIS — Z79899 Other long term (current) drug therapy: Secondary | ICD-10-CM

## 2016-11-09 DIAGNOSIS — Z91148 Patient's other noncompliance with medication regimen for other reason: Secondary | ICD-10-CM

## 2016-11-09 DIAGNOSIS — Z833 Family history of diabetes mellitus: Secondary | ICD-10-CM

## 2016-11-09 DIAGNOSIS — E1010 Type 1 diabetes mellitus with ketoacidosis without coma: Principal | ICD-10-CM | POA: Diagnosis present

## 2016-11-09 DIAGNOSIS — D72829 Elevated white blood cell count, unspecified: Secondary | ICD-10-CM | POA: Diagnosis present

## 2016-11-09 LAB — CBC WITH DIFFERENTIAL/PLATELET
BASOS PCT: 0 %
Basophils Absolute: 0.1 10*3/uL (ref 0.0–0.1)
EOS ABS: 0 10*3/uL (ref 0.0–0.7)
Eosinophils Relative: 0 %
HCT: 47.3 % (ref 39.0–52.0)
Hemoglobin: 15.5 g/dL (ref 13.0–17.0)
Lymphocytes Relative: 10 %
Lymphs Abs: 2.5 10*3/uL (ref 0.7–4.0)
MCH: 29.7 pg (ref 26.0–34.0)
MCHC: 32.8 g/dL (ref 30.0–36.0)
MCV: 90.6 fL (ref 78.0–100.0)
MONO ABS: 1.2 10*3/uL — AB (ref 0.1–1.0)
MONOS PCT: 5 %
NEUTROS ABS: 21.2 10*3/uL — AB (ref 1.7–7.7)
NEUTROS PCT: 85 %
Platelets: 377 10*3/uL (ref 150–400)
RBC: 5.22 MIL/uL (ref 4.22–5.81)
RDW: 12.6 % (ref 11.5–15.5)
WBC: 25 10*3/uL — ABNORMAL HIGH (ref 4.0–10.5)

## 2016-11-09 LAB — COMPREHENSIVE METABOLIC PANEL
ALBUMIN: 4.5 g/dL (ref 3.5–5.0)
ALT: 18 U/L (ref 17–63)
ANION GAP: 29 — AB (ref 5–15)
AST: 23 U/L (ref 15–41)
Alkaline Phosphatase: 76 U/L (ref 38–126)
BUN: 31 mg/dL — AB (ref 6–20)
CHLORIDE: 96 mmol/L — AB (ref 101–111)
CO2: 11 mmol/L — AB (ref 22–32)
Calcium: 10 mg/dL (ref 8.9–10.3)
Creatinine, Ser: 1.47 mg/dL — ABNORMAL HIGH (ref 0.61–1.24)
GFR calc Af Amer: >60 mL/min (ref >60)
GFR calc non Af Amer: >60 mL/min (ref >60)
GLUCOSE: 561 mg/dL — AB (ref 65–99)
POTASSIUM: 4.5 mmol/L (ref 3.5–5.1)
SODIUM: 136 mmol/L (ref 135–145)
TOTAL PROTEIN: 7.5 g/dL (ref 6.5–8.1)
Total Bilirubin: 2.6 mg/dL — ABNORMAL HIGH (ref 0.3–1.2)

## 2016-11-09 LAB — BLOOD GAS, VENOUS
Acid-base deficit: 14.5 mmol/L — ABNORMAL HIGH (ref 0.0–2.0)
Bicarbonate: 13.4 mmol/L — ABNORMAL LOW (ref 20.0–28.0)
FIO2: 0.21
O2 Saturation: 85.2 %
PATIENT TEMPERATURE: 37
pCO2, Ven: 29.1 mmHg — ABNORMAL LOW (ref 44.0–60.0)
pH, Ven: 7.225 — ABNORMAL LOW (ref 7.250–7.430)
pO2, Ven: 57.6 mmHg — ABNORMAL HIGH (ref 32.0–45.0)

## 2016-11-09 LAB — CBG MONITORING, ED
GLUCOSE-CAPILLARY: 520 mg/dL — AB (ref 65–99)
GLUCOSE-CAPILLARY: 475 mg/dL — AB (ref 65–99)
GLUCOSE-CAPILLARY: 314 mg/dL — AB (ref 65–99)
GLUCOSE-CAPILLARY: 331 mg/dL — AB (ref 65–99)

## 2016-11-09 LAB — BASIC METABOLIC PANEL
Anion gap: 15 (ref 5–15)
BUN: 29 mg/dL — ABNORMAL HIGH (ref 6–20)
CALCIUM: 8.7 mg/dL — AB (ref 8.9–10.3)
CO2: 16 mmol/L — ABNORMAL LOW (ref 22–32)
CREATININE: 1.12 mg/dL (ref 0.61–1.24)
Chloride: 101 mmol/L (ref 101–111)
GFR calc non Af Amer: >60 mL/min (ref >60)
Glucose, Bld: 348 mg/dL — ABNORMAL HIGH (ref 65–99)
Potassium: 4.9 mmol/L (ref 3.5–5.1)
SODIUM: 132 mmol/L — AB (ref 135–145)

## 2016-11-09 LAB — GLUCOSE, CAPILLARY
GLUCOSE-CAPILLARY: 251 mg/dL — AB (ref 65–99)
Glucose-Capillary: 193 mg/dL — ABNORMAL HIGH (ref 65–99)
Glucose-Capillary: 215 mg/dL — ABNORMAL HIGH (ref 65–99)

## 2016-11-09 LAB — URINALYSIS, ROUTINE W REFLEX MICROSCOPIC
Bilirubin Urine: NEGATIVE
Glucose, UA: >=500 mg/dL — AB
Ketones, ur: 80 mg/dL — AB
LEUKOCYTES UA: NEGATIVE
Nitrite: NEGATIVE
Protein, ur: NEGATIVE mg/dL
SPECIFIC GRAVITY, URINE: 1.023 (ref 1.005–1.030)
SQUAMOUS EPITHELIAL / LPF: NONE SEEN
pH: 6 (ref 5.0–8.0)

## 2016-11-09 LAB — PHOSPHORUS: PHOSPHORUS: 5 mg/dL — AB (ref 2.5–4.6)

## 2016-11-09 LAB — MAGNESIUM: MAGNESIUM: 1.7 mg/dL (ref 1.7–2.4)

## 2016-11-09 MED ORDER — DEXTROSE-NACL 5-0.45 % IV SOLN
INTRAVENOUS | Status: DC
  Administered 2016-11-10: via INTRAVENOUS

## 2016-11-09 MED ORDER — ONDANSETRON HCL 4 MG/2ML IJ SOLN
4.0000 mg | Freq: Once | INTRAMUSCULAR | Status: AC
  Administered 2016-11-09: 4 mg via INTRAVENOUS
  Filled 2016-11-09: qty 2

## 2016-11-09 MED ORDER — INSULIN REGULAR HUMAN 100 UNIT/ML IJ SOLN: INTRAMUSCULAR | Status: DC

## 2016-11-09 MED ORDER — SODIUM CHLORIDE 0.45 % IV SOLN
INTRAVENOUS | Status: DC
  Administered 2016-11-09: 20:00:00 via INTRAVENOUS

## 2016-11-09 MED ORDER — INSULIN ASPART 100 UNIT/ML ~~LOC~~ SOLN
10.0000 [IU] | Freq: Once | SUBCUTANEOUS | Status: AC
  Administered 2016-11-09: 10 [IU] via SUBCUTANEOUS
  Filled 2016-11-09: qty 1

## 2016-11-09 MED ORDER — DEXTROSE-NACL 5-0.45 % IV SOLN: INTRAVENOUS | Status: DC

## 2016-11-09 MED ORDER — MAGNESIUM SULFATE 2 GM/50ML IV SOLN
2.0000 g | Freq: Once | INTRAVENOUS | Status: AC
  Administered 2016-11-09: 2 g via INTRAVENOUS
  Filled 2016-11-09: qty 50

## 2016-11-09 MED ORDER — SODIUM CHLORIDE 0.9 % IV BOLUS (SEPSIS)
2000.0000 mL | Freq: Once | INTRAVENOUS | Status: AC
  Administered 2016-11-09: 2000 mL via INTRAVENOUS

## 2016-11-09 MED ORDER — ONDANSETRON HCL 4 MG PO TABS: 4.0000 mg | ORAL_TABLET | Freq: Four times a day (QID) | ORAL | Status: DC | PRN

## 2016-11-09 MED ORDER — SODIUM CHLORIDE 0.9 % IV SOLN
INTRAVENOUS | Status: AC
  Administered 2016-11-09 (×2): via INTRAVENOUS

## 2016-11-09 MED ORDER — ONDANSETRON HCL 4 MG/2ML IJ SOLN
4.0000 mg | Freq: Four times a day (QID) | INTRAMUSCULAR | Status: DC | PRN
  Administered 2016-11-10: 4 mg via INTRAVENOUS
  Filled 2016-11-09 (×2): qty 2

## 2016-11-09 MED ORDER — INSULIN REGULAR HUMAN 100 UNIT/ML IJ SOLN
INTRAMUSCULAR | Status: DC
  Administered 2016-11-09: 2.5 [IU]/h via INTRAVENOUS
  Filled 2016-11-09 (×2): qty 1

## 2016-11-09 MED ORDER — GABAPENTIN 400 MG PO CAPS
800.0000 mg | ORAL_CAPSULE | Freq: Three times a day (TID) | ORAL | Status: DC
  Filled 2016-11-09: qty 2

## 2016-11-09 MED ORDER — POTASSIUM CHLORIDE 10 MEQ/100ML IV SOLN
10.0000 meq | INTRAVENOUS | Status: AC
  Administered 2016-11-09 (×2): 10 meq via INTRAVENOUS
  Filled 2016-11-09 (×2): qty 100

## 2016-11-09 MED ORDER — HEPARIN SODIUM (PORCINE) 5000 UNIT/ML IJ SOLN: 5000.0000 [IU] | Freq: Three times a day (TID) | INTRAMUSCULAR | Status: DC

## 2016-11-09 MED ORDER — SODIUM CHLORIDE 0.9 % IV SOLN: INTRAVENOUS | Status: DC

## 2016-11-09 MED ORDER — SODIUM CHLORIDE 0.9 % IV SOLN
INTRAVENOUS | Status: DC
  Filled 2016-11-09: qty 1

## 2016-11-09 NOTE — ED Triage Notes (Signed)
Pt reports nausea/vomiting  And hyperglycemia.  Been out of insulin for a few days.  Seen last night for same and states he has gotten worse.  Did not get rx filled.

## 2016-11-09 NOTE — ED Notes (Signed)
Received report on pt, pt lying on left side, no distress noted, reports that he is feeling better, update given,

## 2016-11-09 NOTE — ED Notes (Signed)
Date and time results received: 11/09/16 1750  Test: Glucose Critical Value: 563  Name of Provider Notified: Dr. Dayna Barker  Orders Received? Or Actions Taken?: No new orders given at this time.

## 2016-11-09 NOTE — ED Notes (Signed)
Insulin drip did not match guardrails, RN spoke with [facility] at [facility] who advised that drip needed to be changed,

## 2016-11-09 NOTE — ED Provider Notes (Signed)
Emergency Department Provider Note   I have reviewed the triage vital signs and the nursing notes.   HISTORY  Chief Complaint Hyperglycemia   HPI Pedro [name] is a 21 y.o. male with a history of diabetes who I saw last night for hyperglycemia. Yesterday he had a couple days worth of being out of his insulin. He is not in DKA so was given fluids and a bolus of insulin and he is planning to go get his insulin last night after leaving the emergency department however he did not make it. He subsequently was supposed to get it today but his girlfriend was at work and cannot take him to has not had any insulin since being seen here yesterday. He says his nausea returned this morning his vomiting got worse throughout the day and his altered gotten worse too. Feels like he is going to DKA at this time. No new symptoms otherwise. No modifying or exacerbating symptoms. No pain in his head, chest, back, abdomen, extremities. No rash. Increasing urination but no other urinary changes.   Past Medical History:  Diagnosis Date  . ADHD (attention deficit hyperactivity disorder)   . DM type 1 (diabetes mellitus, type 1) ([place])    diagnosed age 89  . Herpes   . Noncompliance with medications     Patient Active Problem List   Diagnosis Date Noted  . DKA (diabetic ketoacidosis) ([place]) 11/09/2016  . PNA (pneumonia) 07/04/2016  . DM type 1 (diabetes mellitus, type 1) ([place]) 07/01/2016  . Noncompliance with medications 07/01/2016  . Polysubstance abuse   . Tachycardia 10/17/2014  . Acute renal failure ([place]) 10/17/2014  . DKA (diabetic ketoacidoses) ([place]) 03/19/2014  . Diabetic neuropathy ([place]) 03/19/2014  . Dehydration   . Gastroenteritis 02/16/2014  . Diabetic ketoacidosis without coma associated with type 1 diabetes mellitus ([place])   . Genital herpes simplex type 2 12/15/2013  . Tobacco abuse 12/15/2013  . DKA, type 1 ([place]) 12/15/2013  . Diabetic ketoacidosis ([place]) 10/05/2013  .  Hyperkalemia 10/15/2012  . Leukocytosis 10/15/2012  . Noncompliance 10/15/2012  . Polysubstance dependence ([place]) 07/22/2011    Class: Acute  . Substance induced mood disorder ([place]) 07/22/2011    Class: Acute    Past Surgical History:  Procedure Laterality Date  . none        Allergies Sulfa antibiotics  Family History  Problem Relation Age of Onset  . Diabetes Mellitus I Paternal Grandmother   . Diabetes Mellitus I Paternal Aunt     Social History Social History  Substance Use Topics  . Smoking status: Current Every Day Smoker    Packs/day: 0.50    Years: 2.00    Types: Cigarettes  . Smokeless tobacco: Never Used  . Alcohol use No    Review of Systems  All other systems negative except as documented in the HPI. All pertinent positives and negatives as reviewed in the HPI. ____________________________________________  PHYSICAL EXAM:  VITAL SIGNS: ED Triage Vitals  Enc Vitals Group     BP 11/09/16 1646 121/74     Pulse Rate 11/09/16 1646 (!) 117     Resp 11/09/16 1646 (!) 26     Temp 11/09/16 1646 97.9 F (36.6 C)     Temp Source 11/09/16 1646 Oral     SpO2 11/09/16 1646 100 %     Weight 11/09/16 1646 145 lb (65.8 kg)     Height 11/09/16 1646 5\' 11"  (1.803 m)     Head Circumference --  Peak Flow --      Pain Score 11/09/16 1645 8     Pain Loc --      Pain Edu? --      Excl. in [place]? --     Constitutional: Alert but sleeping. Well appearing and in no acute distress. Eyes: Conjunctivae are normal. PERRL. EOMI. Head: Atraumatic. Nose: No congestion/rhinnorhea. Mouth/Throat: Mucous membranes are moist.  Oropharynx non-erythematous. Neck: No stridor.  No meningeal signs.   Cardiovascular: tachycardic rate, regular rhythm. Good peripheral circulation. Grossly normal heart sounds.   Respiratory: tachypneic respiratory effort.  No retractions. Lungs CTAB. Gastrointestinal: Soft and nontender. No distention.  Musculoskeletal: No lower extremity  tenderness nor edema. No gross deformities of extremities. Neurologic:  Normal speech and language. No gross focal neurologic deficits are appreciated.  Skin:  Skin is warm, dry and intact. No rash noted.  ____________________________________________   LABS (all labs ordered are listed, but only abnormal results are displayed)  Labs Reviewed  CBC WITH DIFFERENTIAL/PLATELET - Abnormal; Notable for the following:       Result Value   WBC 25.0 (*)    Neutro Abs 21.2 (*)    Monocytes Absolute 1.2 (*)    All other components within normal limits  COMPREHENSIVE METABOLIC PANEL - Abnormal; Notable for the following:    Chloride 96 (*)    CO2 11 (*)    Glucose, Bld 561 (*)    BUN 31 (*)    Creatinine, Ser 1.47 (*)    Total Bilirubin 2.6 (*)    Anion gap 29 (*)    All other components within normal limits  URINALYSIS, ROUTINE W REFLEX MICROSCOPIC - Abnormal; Notable for the following:    Glucose, UA >=500 (*)    Hgb urine dipstick LARGE (*)    Ketones, ur 80 (*)    Bacteria, UA RARE (*)    All other components within normal limits  BLOOD GAS, VENOUS - Abnormal; Notable for the following:    pH, Ven 7.225 (*)    pCO2, Ven 29.1 (*)    pO2, Ven 57.6 (*)    Bicarbonate 13.4 (*)    Acid-base deficit 14.5 (*)    All other components within normal limits  PHOSPHORUS - Abnormal; Notable for the following:    Phosphorus 5.0 (*)    All other components within normal limits  BASIC METABOLIC PANEL - Abnormal; Notable for the following:    Sodium 132 (*)    CO2 16 (*)    Glucose, Bld 348 (*)    BUN 29 (*)    Calcium 8.7 (*)    All other components within normal limits  GLUCOSE, CAPILLARY - Abnormal; Notable for the following:    Glucose-Capillary 251 (*)    All other components within normal limits  GLUCOSE, CAPILLARY - Abnormal; Notable for the following:    Glucose-Capillary 215 (*)    All other components within normal limits  GLUCOSE, CAPILLARY - Abnormal; Notable for the  following:    Glucose-Capillary 193 (*)    All other components within normal limits  CBG MONITORING, ED - Abnormal; Notable for the following:    Glucose-Capillary 520 (*)    All other components within normal limits  CBG MONITORING, ED - Abnormal; Notable for the following:    Glucose-Capillary 475 (*)    All other components within normal limits  CBG MONITORING, ED - Abnormal; Notable for the following:    Glucose-Capillary 314 (*)    All other components within  normal limits  CBG MONITORING, ED - Abnormal; Notable for the following:    Glucose-Capillary 331 (*)    All other components within normal limits  CULTURE, BLOOD (ROUTINE X 2)  CULTURE, BLOOD (ROUTINE X 2)  MRSA PCR SCREENING  MAGNESIUM  BASIC METABOLIC PANEL  BASIC METABOLIC PANEL  CBC WITH DIFFERENTIAL/PLATELET  PHOSPHORUS   ____________________________________________  RADIOLOGY  Dg Chest 2 View  Result Date: 11/09/2016 CLINICAL DATA:  Low back pain and shortness of breath when standing. Current smoker. History of diabetes. EXAM: CHEST  2 VIEW COMPARISON:  07/02/2016 FINDINGS: Mild hyperinflation. Normal heart size and pulmonary vascularity. No focal airspace disease or consolidation in the lungs. No blunting of costophrenic angles. No pneumothorax. Mediastinal contours appear intact. Previous lung infiltrates have resolved. IMPRESSION: No active cardiopulmonary disease. Electronically Signed   By: Lucienne Capers M.D.   On: 11/09/2016 21:48   ____________________________________________   PROCEDURES  Procedure(s) performed:   Procedures   CRITICAL CARE Performed by: Merrily Pew Total critical care time: 35 minutes Critical care time was exclusive of separately billable procedures and treating other patients. Critical care was necessary to treat or prevent imminent or life-threatening deterioration. Critical care was time spent personally by me on the following activities: development of treatment plan  with patient and/or surrogate as well as nursing, discussions with consultants, evaluation of patient's response to treatment, examination of patient, obtaining history from patient or surrogate, ordering and performing treatments and interventions, ordering and review of laboratory studies, ordering and review of radiographic studies, pulse oximetry and re-evaluation of patient's condition.  ____________________________________________   INITIAL IMPRESSION / ASSESSMENT AND PLAN / ED COURSE  Pertinent labs & imaging results that were available during my care of the patient were reviewed by me and considered in my medical decision making (see chart for details).  Will eval for DKA. Insulin/fluids in mean time.   Patient in DKA. Infusion of insulin started. Patient will be admitted to ICU.  ____________________________________________  FINAL CLINICAL IMPRESSION(S) / ED DIAGNOSES  Final diagnoses:  Diabetic ketoacidosis without coma associated with type 1 diabetes mellitus ([place])    MEDICATIONS GIVEN DURING THIS VISIT:  Medications  dextrose 5 %-0.45 % sodium chloride infusion (not administered)  gabapentin (NEURONTIN) capsule 800 mg (800 mg Oral Not Given 11/09/16 2200)  0.9 %  sodium chloride infusion ( Intravenous Stopped 11/10/16 0018)  0.9 %  sodium chloride infusion (not administered)  dextrose 5 %-0.45 % sodium chloride infusion ( Intravenous New Bag/Given 11/10/16 0018)  insulin regular (NOVOLIN R,HUMULIN R) 100 Units in sodium chloride 0.9 % 100 mL (1 Units/mL) infusion (5.3 Units/hr Intravenous Rate/Dose Change 11/10/16 0015)  ondansetron (ZOFRAN) tablet 4 mg ( Oral See Alternative 11/10/16 0006)    Or  ondansetron (ZOFRAN) injection 4 mg (4 mg Intravenous Given 11/10/16 0006)  heparin injection 5,000 Units (5,000 Units Subcutaneous Not Given 11/09/16 2200)  0.45 % sodium chloride infusion ( Intravenous Stopped 11/09/16 2217)  sodium chloride 0.9 % bolus 2,000 mL (0 mLs Intravenous Stopped  11/09/16 1928)  ondansetron (ZOFRAN) injection 4 mg (4 mg Intravenous Given 11/09/16 1702)  insulin aspart (novoLOG) injection 10 Units (10 Units Subcutaneous Given 11/09/16 1703)  potassium chloride 10 mEq in 100 mL IVPB (0 mEq Intravenous Stopped 11/10/16 0011)  magnesium sulfate IVPB 2 g 50 mL (0 g Intravenous Stopped 11/09/16 2315)    NEW OUTPATIENT MEDICATIONS STARTED DURING THIS VISIT:  Current Discharge Medication List      Note:  This document was prepared  using Systems analyst and may include unintentional dictation errors.   Edie Darley, Corene Cornea, MD 11/10/16 [phone]

## 2016-11-09 NOTE — H&P (Signed)
History and Physical    Pedro [name] IRS:[id number] DOB: 1996/01/15 DOA: 11/09/2016  PCP: Chevis Pretty, FNP   Patient coming from: Home.  I have personally briefly reviewed patient's old medical records in [place]  Chief Complaint: Nausea, vomiting and hyperglycemia.  HPI: Pedro [name] is a 21 y.o. male with medical history significant of type 1 diabetes since age 19, ADHD, history of herpes, noncompliance with medications who is returning today to the emergency department after being seen for hyperglycemia yesterday. He was supposed to get a Lantus prescription today, but was unable to do so. He mentions that early in the morning started having nausea, followed by multiple episodes of emesis associated with mild epigastric abdominal pain. He denies fever, chills, but feels fatigued. He denies sore throat, productive cough, chest pain, palpitations, diaphoresis, pitting edema lower extremities, but felt lightheaded earlier. He mentions that he had a loose stool bowel movement earlier, but denies hematochezia or melena. No dysuria, no frequency, no polydipsia, polyuria and concentrated urine.  ED Course: His initial vital signs temperature 97.71F, pulse 117, respirations 26, blood pressure 121/74 O2 sat 100% on room air. The patient was given IV fluids, antiemetics and was started on insulin infusion.  Workup shows WBC of 25,000 with 85%, hemoglobin 15.5 g/dL and platelets 377. Sodium 136, potassium 4.5, chloride 96, bicarbonate 11 and anion gap 29 mmol/L. BUN was 31, creatinine 1.47 (baseline is around 0.8-1.0), glucose 561, phosphorus 5.0 magnesium 1.7 mg/dL.  Review of Systems: As per HPI otherwise 10 point review of systems negative.    Past Medical History:  Diagnosis Date  . ADHD (attention deficit hyperactivity disorder)   . DM type 1 (diabetes mellitus, type 1) ([place])    diagnosed age 31  . Herpes   . Noncompliance with medications     Past Surgical  History:  Procedure Laterality Date  . none       reports that he has been smoking Cigarettes.  He has a 1.00 pack-year smoking history. He has never used smokeless tobacco. He reports that he uses drugs, including Marijuana, Other-see comments, Benzodiazepines, Hydrocodone, and Oxycodone. He reports that he does not drink alcohol.  Allergies  Allergen Reactions  . Sulfa Antibiotics Hives and Other (See Comments)    Reaction:  Fever     Family History  Problem Relation Age of Onset  . Diabetes Mellitus I Paternal Grandmother   . Diabetes Mellitus I Paternal Aunt     Prior to Admission medications   Medication Sig Start Date End Date Taking? Authorizing Provider  gabapentin (NEURONTIN) 800 MG tablet Take 1 tablet (800 mg total) by mouth 3 (three) times daily. 07/04/16  Yes Regalado, Belkys A, MD  insulin glargine (LANTUS) 100 UNIT/ML injection Inject 0.4 mLs (40 Units total) into the skin every evening. 11/08/16  Yes Mesner, Corene Cornea, MD  insulin NPH-regular Human (NOVOLIN 70/30) (70-30) 100 UNIT/ML injection Inject 25 Units into the skin daily as needed (for high blood sugars).   Yes [provider]    Physical Exam: Vitals:   11/09/16 1730 11/09/16 1800 11/09/16 1830 11/09/16 1900  BP: 118/64 113/61 116/63 118/66  Pulse: (!) 106 (!) 109 (!) 101 99  Resp: (!) 30 18 (!) 21 (!) 24  Temp:      TempSrc:      SpO2: 100% 100% 100% 100%  Weight:      Height:        Constitutional: NAD, calm, comfortable Eyes: PERRL, lids and conjunctivae  normal ENMT: Mucous membranes are dry. Posterior pharynx clear of any exudate or lesions. Neck: normal, supple, no masses, no thyromegaly Respiratory: clear to auscultation bilaterally, no wheezing, no crackles. Normal respiratory effort. No accessory muscle use.  Cardiovascular: Tachycardic at 102 BPM, no murmurs / rubs / gallops. No extremity edema. 2+ pedal pulses. No carotid bruits.  Abdomen: Soft, positive epigastric tenderness, no  masses palpated. No hepatosplenomegaly. Bowel sounds positive.  Musculoskeletal: no clubbing / cyanosis. Good ROM, no contractures. Normal muscle tone.  Skin: no significant rashes, lesions, ulcers on limited skin exam. Neurologic: CN 2-12 grossly intact. Sensation intact, DTR normal. Strength 5/5 in all 4.  Psychiatric: Normal judgment and insight. Alert and oriented x 3. Normal mood.    Labs on Admission: I have personally reviewed following labs and imaging studies  CBC:  Recent Labs Lab 11/08/16 2002 11/09/16 1650  WBC 10.1 25.0*  NEUTROABS 7.0 21.2*  HGB 16.0 15.5  HCT 48.4 47.3  MCV 90.5 90.6  PLT 332 419   Basic Metabolic Panel:  Recent Labs Lab 11/08/16 2002 11/09/16 1650  NA 132* 136  K 4.9 4.5  CL 96* 96*  CO2 25 11*  GLUCOSE 489* 561*  BUN 18 31*  CREATININE 1.00 1.47*  CALCIUM 9.4 10.0   GFR: Estimated Creatinine Clearance: 72.7 mL/min (A) (by C-G formula based on SCr of 1.47 mg/dL (H)). Liver Function Tests:  Recent Labs Lab 11/08/16 2002 11/09/16 1650  AST 14* 23  ALT 17 18  ALKPHOS 72 76  BILITOT 1.3* 2.6*  PROT 7.6 7.5  ALBUMIN 4.4 4.5   No results for input(s): LIPASE, AMYLASE in the last 168 hours. No results for input(s): AMMONIA in the last 168 hours. Coagulation Profile: No results for input(s): INR, PROTIME in the last 168 hours. Cardiac Enzymes: No results for input(s): CKTOTAL, CKMB, CKMBINDEX, TROPONINI in the last 168 hours. BNP (last 3 results) No results for input(s): PROBNP in the last 8760 hours. HbA1C: No results for input(s): HGBA1C in the last 72 hours. CBG:  Recent Labs Lab 11/08/16 1929 11/08/16 2157 11/09/16 1643 11/09/16 1737  GLUCAP 425* 292* 520* 475*   Lipid Profile: No results for input(s): CHOL, HDL, LDLCALC, TRIG, CHOLHDL, LDLDIRECT in the last 72 hours. Thyroid Function Tests: No results for input(s): TSH, T4TOTAL, FREET4, T3FREE, THYROIDAB in the last 72 hours. Anemia Panel: No results for  input(s): VITAMINB12, FOLATE, FERRITIN, TIBC, IRON, RETICCTPCT in the last 72 hours. Urine analysis:    Component Value Date/Time   COLORURINE STRAW (A) 11/08/2016 1935   APPEARANCEUR CLEAR 11/08/2016 1935   LABSPEC 1.033 (H) 11/08/2016 1935   PHURINE 6.0 11/08/2016 1935   GLUCOSEU >=500 (A) 11/08/2016 1935   HGBUR MODERATE (A) 11/08/2016 [place] NEGATIVE 11/08/2016 1935   BILIRUBINUR neg 04/16/2014 1752   KETONESUR 20 (A) 11/08/2016 1935   PROTEINUR NEGATIVE 11/08/2016 1935   UROBILINOGEN 0.2 10/17/2014 0743   NITRITE NEGATIVE 11/08/2016 1935   LEUKOCYTESUR NEGATIVE 11/08/2016 1935    Radiological Exams on Admission: No results found.  EKG: Independently reviewed   Assessment/Plan Principal Problem:   DKA (diabetic ketoacidosis) ([place]) Admit to stepdown/inpatient. Keep nothing by mouth. Continue IV fluids. Continue insulin infusion. Antiemetics as needed. BMP every 4 hours. Replace electrolytes as needed.  Active Problems:   Leukocytosis No fever, no chills, no obvious signs of infection. Blood cultures 2 drawn. Ordered urinalysis and chest radiograph.    Dehydration Continue IV fluids. Monitor intake and output. Follow-up BUN and creatinine.  Diabetic neuropathy ([facility]) Continue Neurontin 800 mg by mouth 3 times a day.    Noncompliance with medications Advised to avoid running out of insulin to avoid DKA hospitalizations.     DVT prophylaxis: Heparin SQ. Code Status: Full code. Family Communication:  Disposition Plan: Admit for DKA treatment. Consults called:  Admission status: Stepdown/inpatient.   Reubin Milan MD [facility] Pager [phone].  If 7PM-7AM, please contact night-coverage [email or web address] Password [facility]  11/09/2016, 7:24 PM

## 2016-11-10 DIAGNOSIS — D72829 Elevated white blood cell count, unspecified: Secondary | ICD-10-CM

## 2016-11-10 DIAGNOSIS — E860 Dehydration: Secondary | ICD-10-CM

## 2016-11-10 DIAGNOSIS — Z9114 Patient's other noncompliance with medication regimen: Secondary | ICD-10-CM

## 2016-11-10 LAB — BASIC METABOLIC PANEL
ANION GAP: 7 (ref 5–15)
ANION GAP: 9 (ref 5–15)
BUN: 26 mg/dL — ABNORMAL HIGH (ref 6–20)
BUN: 27 mg/dL — ABNORMAL HIGH (ref 6–20)
CHLORIDE: 109 mmol/L (ref 101–111)
CO2: 19 mmol/L — ABNORMAL LOW (ref 22–32)
CO2: 22 mmol/L (ref 22–32)
Calcium: 8.3 mg/dL — ABNORMAL LOW (ref 8.9–10.3)
Calcium: 8.3 mg/dL — ABNORMAL LOW (ref 8.9–10.3)
Chloride: 107 mmol/L (ref 101–111)
Creatinine, Ser: 0.87 mg/dL (ref 0.61–1.24)
Creatinine, Ser: 0.76 mg/dL (ref 0.61–1.24)
GFR calc Af Amer: >60 mL/min (ref >60)
GFR calc Af Amer: >60 mL/min (ref >60)
GLUCOSE: 194 mg/dL — AB (ref 65–99)
Glucose, Bld: 138 mg/dL — ABNORMAL HIGH (ref 65–99)
POTASSIUM: 3.5 mmol/L (ref 3.5–5.1)
POTASSIUM: 3.7 mmol/L (ref 3.5–5.1)
SODIUM: 138 mmol/L (ref 135–145)
Sodium: 135 mmol/L (ref 135–145)

## 2016-11-10 LAB — CBC WITH DIFFERENTIAL/PLATELET
Basophils Absolute: 0 10*3/uL (ref 0.0–0.1)
Basophils Relative: 0 %
Eosinophils Absolute: 0 10*3/uL (ref 0.0–0.7)
Eosinophils Relative: 0 %
HCT: 37.5 % — ABNORMAL LOW (ref 39.0–52.0)
Hemoglobin: 12.8 g/dL — ABNORMAL LOW (ref 13.0–17.0)
LYMPHS PCT: 14 %
Lymphs Abs: 3 10*3/uL (ref 0.7–4.0)
MCH: 30 pg (ref 26.0–34.0)
MCHC: 34.1 g/dL (ref 30.0–36.0)
MCV: 87.8 fL (ref 78.0–100.0)
Monocytes Absolute: 1.4 10*3/uL — ABNORMAL HIGH (ref 0.1–1.0)
Monocytes Relative: 6 %
NEUTROS ABS: 17.8 10*3/uL — AB (ref 1.7–7.7)
Neutrophils Relative %: 80 %
PLATELETS: 342 10*3/uL (ref 150–400)
RBC: 4.27 MIL/uL (ref 4.22–5.81)
RDW: 12.6 % (ref 11.5–15.5)
WBC: 22.2 10*3/uL — AB (ref 4.0–10.5)

## 2016-11-10 LAB — GLUCOSE, CAPILLARY
GLUCOSE-CAPILLARY: 167 mg/dL — AB (ref 65–99)
GLUCOSE-CAPILLARY: 138 mg/dL — AB (ref 65–99)
GLUCOSE-CAPILLARY: 124 mg/dL — AB (ref 65–99)
GLUCOSE-CAPILLARY: 139 mg/dL — AB (ref 65–99)
GLUCOSE-CAPILLARY: 239 mg/dL — AB (ref 65–99)
GLUCOSE-CAPILLARY: 118 mg/dL — AB (ref 65–99)
Glucose-Capillary: 150 mg/dL — ABNORMAL HIGH (ref 65–99)
Glucose-Capillary: 147 mg/dL — ABNORMAL HIGH (ref 65–99)
Glucose-Capillary: 139 mg/dL — ABNORMAL HIGH (ref 65–99)

## 2016-11-10 LAB — PHOSPHORUS: Phosphorus: 2.5 mg/dL (ref 2.5–4.6)

## 2016-11-10 LAB — MRSA PCR SCREENING: MRSA BY PCR: NEGATIVE

## 2016-11-10 MED ORDER — SODIUM CHLORIDE 0.9 % IV BOLUS (SEPSIS)
1000.0000 mL | INTRAVENOUS | Status: AC
  Administered 2016-11-10 (×2): 1000 mL via INTRAVENOUS

## 2016-11-10 MED ORDER — LORAZEPAM 2 MG/ML IJ SOLN
1.0000 mg | INTRAMUSCULAR | Status: DC | PRN
  Administered 2016-11-10: 1 mg via INTRAVENOUS
  Filled 2016-11-10: qty 1

## 2016-11-10 MED ORDER — INSULIN GLARGINE 100 UNIT/ML ~~LOC~~ SOLN: 40.0000 [IU] | Freq: Every day | SUBCUTANEOUS | 10 refills | Status: DC

## 2016-11-10 MED ORDER — ONDANSETRON HCL 4 MG/2ML IJ SOLN
4.0000 mg | INTRAMUSCULAR | Status: DC | PRN
  Administered 2016-11-10: 4 mg via INTRAVENOUS

## 2016-11-10 MED ORDER — ONDANSETRON HCL 4 MG PO TABS: 4.0000 mg | ORAL_TABLET | ORAL | Status: DC | PRN

## 2016-11-10 MED ORDER — INSULIN GLARGINE 100 UNIT/ML ~~LOC~~ SOLN
20.0000 [IU] | Freq: Once | SUBCUTANEOUS | Status: AC
  Administered 2016-11-10: 20 [IU] via SUBCUTANEOUS
  Filled 2016-11-10: qty 0.2

## 2016-11-10 MED ORDER — SODIUM CHLORIDE 0.9 % IV SOLN
INTRAVENOUS | Status: DC
  Administered 2016-11-10: 11:00:00 via INTRAVENOUS

## 2016-11-10 MED ORDER — INSULIN GLARGINE 100 UNIT/ML ~~LOC~~ SOLN
40.0000 [IU] | Freq: Every day | SUBCUTANEOUS | Status: DC
  Filled 2016-11-10: qty 0.4

## 2016-11-10 MED ORDER — INSULIN ASPART 100 UNIT/ML ~~LOC~~ SOLN: 0.0000 [IU] | Freq: Every day | SUBCUTANEOUS | Status: DC

## 2016-11-10 MED ORDER — METOCLOPRAMIDE HCL 5 MG/ML IJ SOLN
5.0000 mg | Freq: Four times a day (QID) | INTRAMUSCULAR | Status: DC
  Administered 2016-11-10 (×2): 5 mg via INTRAVENOUS
  Filled 2016-11-10 (×2): qty 2

## 2016-11-10 MED ORDER — INSULIN ASPART 100 UNIT/ML ~~LOC~~ SOLN
0.0000 [IU] | Freq: Three times a day (TID) | SUBCUTANEOUS | Status: DC
  Administered 2016-11-10: 5 [IU] via SUBCUTANEOUS
  Administered 2016-11-10: 2 [IU] via SUBCUTANEOUS

## 2016-11-10 NOTE — Discharge Summary (Signed)
Physician Discharge Summary  Pedro [name] UXN:[id number] DOB: 11/27/1995 DOA: 11/09/2016  PCP: Chevis Pretty, FNP  Admit date: 11/09/2016 Discharge date: 11/10/2016  Admitted From: Home Disposition:  Home  Recommendations for Outpatient Follow-up:  1. Follow up with PCP in 1-2 weeks 2. Please obtain BMP/CBC in one week 3. Follow-up has been arranged with the health department  Discharge Condition:Stable CODE STATUS:Full code Diet recommendation: Carb Modified   Brief/Interim Summary: 21 year old male with a history of type 1 diabetes and frequent admissions for diabetic ketoacidosis, presenting to the hospital with complaints of nausea, vomiting and hyperglycemia. He reportedly ran out of his Lantus. He was found to be in diabetic ketoacidosis and was started on insulin infusion as well as aggressive IV hydration. With treatment, his anion gap closed. He was transitioned back to his home dose of Lantus and his blood sugars have been stable. He has been given a prescription for Lantus. He was seen by case management as well as diabetes coordinator. He did complain of some nausea, was started on Reglan with improvement of symptoms. He is able to tolerate a diet prior to discharge. Patient also was noted to have a leukocytosis. He did not have any evidence of underlying infection or sepsis. Chest x-ray did not show any evidence of pneumonia. This is likely hemoconcentration/leukemoid reaction to DKA. He should have repeat CBC in 1 week to ensure resolution. Patient is feeling better and is requesting discharge home at this time. He's been asked to follow up with primary care physician in the next 1-2 weeks.  Discharge Diagnoses:  Principal Problem:   DKA (diabetic ketoacidosis) ([place]) Active Problems:   Leukocytosis   Dehydration   Diabetic neuropathy ([place])   Noncompliance with medications    Discharge Instructions  Discharge Instructions    Diet - low sodium heart healthy     Complete by:  As directed    Increase activity slowly    Complete by:  As directed      Allergies as of 11/10/2016      Reactions   Sulfa Antibiotics Hives, Other (See Comments)   Reaction:  Fever      Medication List    TAKE these medications   gabapentin 800 MG tablet Commonly known as:  NEURONTIN Take 1 tablet (800 mg total) by mouth 3 (three) times daily.   insulin glargine 100 UNIT/ML injection Commonly known as:  LANTUS Inject 0.4 mLs (40 Units total) into the skin daily. What changed:  when to take this   NOVOLIN 70/30 (70-30) 100 UNIT/ML injection Generic drug:  insulin NPH-regular Human Inject 25 Units into the skin daily as needed (for high blood sugars).            Discharge Care Instructions        Start     Ordered   11/10/16 0000  insulin glargine (LANTUS) 100 UNIT/ML injection  Daily    Comments:  Please provide vial.   11/10/16 1615   11/10/16 0000  Increase activity slowly     11/10/16 1615   11/10/16 0000  Diet - low sodium heart healthy     11/10/16 1615     Follow-up Information    [facility] Follow up.   Why:  appointment 11/23/2016 at 9:00 for follow up care of your Diabetes and future prescription needs. Please bring proof of [place]. If you can not make this appt., please call and reschedule.  Contact information: [place]  [place] [address] [phone]          Allergies  Allergen Reactions  . Sulfa Antibiotics Hives and Other (See Comments)    Reaction:  Fever     Consultations:     Procedures/Studies: Dg Chest 2 View  Result Date: 11/09/2016 CLINICAL DATA:  Low back pain and shortness of breath when standing. Current smoker. History of diabetes. EXAM: CHEST  2 VIEW COMPARISON:  07/02/2016 FINDINGS: Mild hyperinflation. Normal heart size and pulmonary vascularity. No focal airspace disease or consolidation in the lungs. No blunting of costophrenic angles. No pneumothorax. Mediastinal  contours appear intact. Previous lung infiltrates have resolved. IMPRESSION: No active cardiopulmonary disease. Electronically Signed   By: Lucienne Capers M.D.   On: 11/09/2016 21:48       Subjective: Feeling better. Tolerated diet. No vomiting.  Discharge Exam: Vitals:   11/10/16 1129 11/10/16 1546  BP:    Pulse: 80 66  Resp: 18 15  Temp: 99.1 F (37.3 C) 98.3 F (36.8 C)  SpO2: 99% 100%   Vitals:   11/10/16 0500 11/10/16 0731 11/10/16 1129 11/10/16 1546  BP:      Pulse:  94 80 66  Resp:  17 18 15   Temp:  98.5 F (36.9 C) 99.1 F (37.3 C) 98.3 F (36.8 C)  TempSrc:  Oral Oral Oral  SpO2:  100% 99% 100%  Weight: 57.8 kg (127 lb 6.8 oz)     Height:        General: Pt is alert, awake, not in acute distress Cardiovascular: RRR, S1/S2 +, no rubs, no gallops Respiratory: CTA bilaterally, no wheezing, no rhonchi Abdominal: Soft, NT, ND, bowel sounds + Extremities: no edema, no cyanosis    The results of significant diagnostics from this hospitalization (including imaging, microbiology, ancillary and laboratory) are listed below for reference.     Microbiology: Recent Results (from the past 240 hour(s))  Culture, blood (routine x 2)     Status: None (Preliminary result)   Collection Time: 11/09/16  7:49 PM  Result Value Ref Range Status   Specimen Description BLOOD LEFT ARM  Final   Special Requests   Final    BOTTLES DRAWN AEROBIC AND ANAEROBIC Blood Culture adequate volume   Culture NO GROWTH < 12 HOURS  Final   Report Status PENDING  Incomplete  Culture, blood (routine x 2)     Status: None (Preliminary result)   Collection Time: 11/09/16  7:50 PM  Result Value Ref Range Status   Specimen Description BLOOD LEFT ARM  Final   Special Requests   Final    BOTTLES DRAWN AEROBIC AND ANAEROBIC Blood Culture adequate volume   Culture NO GROWTH < 12 HOURS  Final   Report Status PENDING  Incomplete  MRSA PCR Screening     Status: None   Collection Time: 11/09/16   9:47 PM  Result Value Ref Range Status   MRSA by PCR NEGATIVE NEGATIVE Final    Comment:        The GeneXpert MRSA Assay (FDA approved for NASAL specimens only), is one component of a comprehensive MRSA colonization surveillance program. It is not intended to diagnose MRSA infection nor to guide or monitor treatment for MRSA infections.      Labs: BNP (last 3 results) No results for input(s): BNP in the last 8760 hours. Basic Metabolic Panel:  Recent Labs Lab 11/08/16 2002 11/09/16 1650 11/09/16 1950 11/10/16 0023 11/10/16 0525  NA 132* 136 132* 135 138  K 4.9 4.5  4.9 3.7 3.5  CL 96* 96* 101 109 107  CO2 25 11* 16* 19* 22  GLUCOSE 489* 561* 348* 194* 138*  BUN 18 31* 29* 26* 27*  CREATININE 1.00 1.47* 1.12 0.87 0.76  CALCIUM 9.4 10.0 8.7* 8.3* 8.3*  MG  --  1.7  --   --   --   PHOS  --  5.0*  --   --  2.5   Liver Function Tests:  Recent Labs Lab 11/08/16 2002 11/09/16 1650  AST 14* 23  ALT 17 18  ALKPHOS 72 76  BILITOT 1.3* 2.6*  PROT 7.6 7.5  ALBUMIN 4.4 4.5   No results for input(s): LIPASE, AMYLASE in the last 168 hours. No results for input(s): AMMONIA in the last 168 hours. CBC:  Recent Labs Lab 11/08/16 2002 11/09/16 1650 11/10/16 0525  WBC 10.1 25.0* 22.2*  NEUTROABS 7.0 21.2* 17.8*  HGB 16.0 15.5 12.8*  HCT 48.4 47.3 37.5*  MCV 90.5 90.6 87.8  PLT 332 377 342   Cardiac Enzymes: No results for input(s): CKTOTAL, CKMB, CKMBINDEX, TROPONINI in the last 168 hours. BNP: Invalid input(s): POCBNP CBG:  Recent Labs Lab 11/10/16 0459 11/10/16 0556 11/10/16 0655 11/10/16 1128 11/10/16 1545  GLUCAP 124* 139* 139* 239* 118*   D-Dimer No results for input(s): DDIMER in the last 72 hours. Hgb A1c No results for input(s): HGBA1C in the last 72 hours. Lipid Profile No results for input(s): CHOL, HDL, LDLCALC, TRIG, CHOLHDL, LDLDIRECT in the last 72 hours. Thyroid function studies No results for input(s): TSH, T4TOTAL, T3FREE, THYROIDAB  in the last 72 hours.  Invalid input(s): FREET3 Anemia work up No results for input(s): VITAMINB12, FOLATE, FERRITIN, TIBC, IRON, RETICCTPCT in the last 72 hours. Urinalysis    Component Value Date/Time   COLORURINE YELLOW 11/09/2016 1651   APPEARANCEUR CLEAR 11/09/2016 1651   LABSPEC 1.023 11/09/2016 1651   PHURINE 6.0 11/09/2016 1651   GLUCOSEU >=500 (A) 11/09/2016 1651   HGBUR LARGE (A) 11/09/2016 1651   BILIRUBINUR NEGATIVE 11/09/2016 1651   BILIRUBINUR neg 04/16/2014 1752   KETONESUR 80 (A) 11/09/2016 1651   PROTEINUR NEGATIVE 11/09/2016 1651   UROBILINOGEN 0.2 10/17/2014 0743   NITRITE NEGATIVE 11/09/2016 1651   LEUKOCYTESUR NEGATIVE 11/09/2016 1651   Sepsis Labs Invalid input(s): PROCALCITONIN,  WBC,  LACTICIDVEN Microbiology Recent Results (from the past 240 hour(s))  Culture, blood (routine x 2)     Status: None (Preliminary result)   Collection Time: 11/09/16  7:49 PM  Result Value Ref Range Status   Specimen Description BLOOD LEFT ARM  Final   Special Requests   Final    BOTTLES DRAWN AEROBIC AND ANAEROBIC Blood Culture adequate volume   Culture NO GROWTH < 12 HOURS  Final   Report Status PENDING  Incomplete  Culture, blood (routine x 2)     Status: None (Preliminary result)   Collection Time: 11/09/16  7:50 PM  Result Value Ref Range Status   Specimen Description BLOOD LEFT ARM  Final   Special Requests   Final    BOTTLES DRAWN AEROBIC AND ANAEROBIC Blood Culture adequate volume   Culture NO GROWTH < 12 HOURS  Final   Report Status PENDING  Incomplete  MRSA PCR Screening     Status: None   Collection Time: 11/09/16  9:47 PM  Result Value Ref Range Status   MRSA by PCR NEGATIVE NEGATIVE Final    Comment:        The GeneXpert MRSA Assay (FDA approved  for NASAL specimens only), is one component of a comprehensive MRSA colonization surveillance program. It is not intended to diagnose MRSA infection nor to guide or monitor treatment for MRSA infections.       Time coordinating discharge: Over 30 minutes  SIGNED:   Kathie Dike, MD  [facility] 11/10/2016, 4:15 PM Pager   If 7PM-7AM, please contact night-coverage [email or web address] Password [email or web address]

## 2016-11-10 NOTE — Progress Notes (Signed)
Inpatient Diabetes Program Recommendations  AACE/ADA: New Consensus Statement on Inpatient Glycemic Control (2015)  Target Ranges:  Prepandial:   less than 140 mg/dL      Peak postprandial:   less than 180 mg/dL (1-2 hours)      Critically ill patients:  140 - 180 mg/dL   Lab Results  Component Value Date   GLUCAP 239 (H) 11/10/2016   HGBA1C 10.3 (H) 07/01/2016   11/10/2016 Pt states that he does not have insurance and cannot afford his insulin.  Pt works part-time at [employer].  Pt states that he would be eligible for insurance through his mother.  He states that in order to get it, he needs proof that he is no longer receiving food stamps from [place] and needs to get his Social Security number checked.  He thinks it has been "stolen". Pt has had bad experiences using 70/30 and therefore buys Lantus.  I encouraged him to consider 70/30 in view of the lesser cost, at least until he can get on his mother's insurance. Will alert case management.  [place], CDE. M.Ed. Pager [phone] Inpatient Diabetes Coordinator

## 2016-11-10 NOTE — Care Management Note (Signed)
Case Management Note  Patient Details  Name: TIERNAN SUTO MRN: [id number] Date of Birth: [birth year]  Subjective/Objective: Adm with DKA. From home, ind with ADL's PTA. Works part time. Patient listed as having a PCP, but does not. This CM arranged OP appt to establish care in April of 2018 of which patient did not attend. He states  " I know I need to, but I didn't". Patient has a history of noncompliance. CM discussed [place]. Patient is agreeable to having appt made and states he will follow up. CM called [facility], they did report that patient had been there in 2016 but walked out. They were gracious enough to let CM make another appointment for 11/23/2016 at 0900. CM discussed medications with patient. Patient is aware that 70/30 is cheaper than Lantus, however patient prefers Lantus and comes to ER for medication refills of Lantus and pays OOP. CM provided patient with Lantus discount card. Discussed with MD also on patient preference. Patient is working to get onto his mother Insurance underwriter. CM discussed with patient the importance of f/u and med compliance.                   Action/Plan: Anticipate DC home with self care. Appointment scheduled and added to AVS. Coupon given for Lantus.   Expected Discharge Date:      11/10/2016            Expected Discharge Plan:  Home/Self Care  In-House Referral:     Discharge planning Services  CM Consult, [facility], Follow-up appt scheduled, Medication Assistance  Post Acute Care Choice:  NA Choice offered to:  NA  DME Arranged:    DME Agency:     HH Arranged:    HH Agency:     Status of Service:  Completed, signed off  If discussed at [employer], dates discussed:    Additional Comments:  Field Staniszewski, Chauncey [place], RN 11/10/2016, 2:06 PM

## 2016-11-11 NOTE — Care Management (Signed)
DC summary, CM notes and Diabetes Coordinator notes faxed to [place] Dept.

## 2016-11-14 LAB — CULTURE, BLOOD (ROUTINE X 2)
Culture: NO GROWTH
Culture: NO GROWTH
Special Requests: ADEQUATE
Special Requests: ADEQUATE

## 2016-11-17 LAB — BLOOD GAS, VENOUS
Acid-Base Excess: 0.5 mmol/L (ref 0.0–2.0)
Bicarbonate: 22.5 mmol/L (ref 20.0–28.0)
FIO2: 21
O2 SAT: 40.3 %
PCO2 VEN: 50.4 mmHg (ref 44.0–60.0)
PH VEN: 7.328 (ref 7.250–7.430)
Patient temperature: 37

## 2017-05-07 ENCOUNTER — Emergency Department (HOSPITAL_COMMUNITY)
Admission: EM | Admit: 2017-05-07 | Discharge: 2017-05-07 | Disposition: A | Discharge status: Home / Self Care | Payer: Self-pay | Attending: Emergency Medicine | Admitting: Emergency Medicine

## 2017-05-07 ENCOUNTER — Other Ambulatory Visit: Payer: Self-pay

## 2017-05-07 ENCOUNTER — Emergency Department (HOSPITAL_COMMUNITY): Payer: Self-pay

## 2017-05-07 ENCOUNTER — Encounter (HOSPITAL_COMMUNITY): Payer: Self-pay | Admitting: Emergency Medicine

## 2017-05-07 DIAGNOSIS — F17210 Nicotine dependence, cigarettes, uncomplicated: Secondary | ICD-10-CM | POA: Insufficient documentation

## 2017-05-07 DIAGNOSIS — E860 Dehydration: Secondary | ICD-10-CM | POA: Insufficient documentation

## 2017-05-07 DIAGNOSIS — Z794 Long term (current) use of insulin: Secondary | ICD-10-CM | POA: Insufficient documentation

## 2017-05-07 DIAGNOSIS — Z79899 Other long term (current) drug therapy: Secondary | ICD-10-CM | POA: Insufficient documentation

## 2017-05-07 DIAGNOSIS — E109 Type 1 diabetes mellitus without complications: Secondary | ICD-10-CM | POA: Insufficient documentation

## 2017-05-07 DIAGNOSIS — R112 Nausea with vomiting, unspecified: Secondary | ICD-10-CM | POA: Insufficient documentation

## 2017-05-07 LAB — URINALYSIS, ROUTINE W REFLEX MICROSCOPIC
BACTERIA UA: NONE SEEN
Bilirubin Urine: NEGATIVE
GLUCOSE, UA: 50 mg/dL — AB
Hgb urine dipstick: NEGATIVE
Ketones, ur: 20 mg/dL — AB
Leukocytes, UA: NEGATIVE
NITRITE: NEGATIVE
PROTEIN: 30 mg/dL — AB
SPECIFIC GRAVITY, URINE: 1.021 (ref 1.005–1.030)
pH: 6 (ref 5.0–8.0)

## 2017-05-07 LAB — COMPREHENSIVE METABOLIC PANEL
ALK PHOS: 112 U/L (ref 38–126)
ALT: 13 U/L — ABNORMAL LOW (ref 17–63)
AST: 15 U/L (ref 15–41)
Albumin: 4.5 g/dL (ref 3.5–5.0)
Anion gap: 17 — ABNORMAL HIGH (ref 5–15)
BUN: 68 mg/dL — AB (ref 6–20)
CHLORIDE: 95 mmol/L — AB (ref 101–111)
CO2: 25 mmol/L (ref 22–32)
CREATININE: 1.29 mg/dL — AB (ref 0.61–1.24)
Calcium: 9.4 mg/dL (ref 8.9–10.3)
GFR calc Af Amer: >60 mL/min (ref >60)
GFR calc non Af Amer: >60 mL/min (ref >60)
Glucose, Bld: 134 mg/dL — ABNORMAL HIGH (ref 65–99)
Potassium: 3.5 mmol/L (ref 3.5–5.1)
SODIUM: 137 mmol/L (ref 135–145)
Total Bilirubin: 1.4 mg/dL — ABNORMAL HIGH (ref 0.3–1.2)
Total Protein: 8.2 g/dL — ABNORMAL HIGH (ref 6.5–8.1)

## 2017-05-07 LAB — RAPID URINE DRUG SCREEN, HOSP PERFORMED
AMPHETAMINES: NOT DETECTED
BARBITURATES: NOT DETECTED
Benzodiazepines: NOT DETECTED
Cocaine: POSITIVE — AB
Opiates: NOT DETECTED
TETRAHYDROCANNABINOL: POSITIVE — AB

## 2017-05-07 LAB — CBC
HEMATOCRIT: 54.3 % — AB (ref 39.0–52.0)
Hemoglobin: 18.4 g/dL — ABNORMAL HIGH (ref 13.0–17.0)
MCH: 29.8 pg (ref 26.0–34.0)
MCHC: 33.9 g/dL (ref 30.0–36.0)
MCV: 88 fL (ref 78.0–100.0)
PLATELETS: 371 10*3/uL (ref 150–400)
RBC: 6.17 MIL/uL — AB (ref 4.22–5.81)
RDW: 13.3 % (ref 11.5–15.5)
WBC: 17.1 10*3/uL — AB (ref 4.0–10.5)

## 2017-05-07 LAB — LIPASE, BLOOD: Lipase: 19 U/L (ref 11–51)

## 2017-05-07 LAB — CBG MONITORING, ED: GLUCOSE-CAPILLARY: 101 mg/dL — AB (ref 65–99)

## 2017-05-07 LAB — RAPID STREP SCREEN (MED CTR MEBANE ONLY): STREPTOCOCCUS, GROUP A SCREEN (DIRECT): NEGATIVE

## 2017-05-07 LAB — CK: Total CK: 96 U/L (ref 49–397)

## 2017-05-07 MED ORDER — ONDANSETRON HCL 4 MG/2ML IJ SOLN
4.0000 mg | Freq: Once | INTRAMUSCULAR | Status: AC
  Administered 2017-05-07: 4 mg via INTRAVENOUS
  Filled 2017-05-07: qty 2

## 2017-05-07 MED ORDER — ACETAMINOPHEN 500 MG PO TABS
1000.0000 mg | ORAL_TABLET | Freq: Once | ORAL | Status: AC
  Administered 2017-05-07: 1000 mg via ORAL
  Filled 2017-05-07: qty 2

## 2017-05-07 MED ORDER — SODIUM CHLORIDE 0.9 % IV BOLUS (SEPSIS)
2000.0000 mL | Freq: Once | INTRAVENOUS | Status: AC
  Administered 2017-05-07: 2000 mL via INTRAVENOUS

## 2017-05-07 MED ORDER — ONDANSETRON 4 MG PO TBDP: ORAL_TABLET | ORAL | 0 refills | Status: DC

## 2017-05-07 NOTE — ED Notes (Signed)
Gave EKG to Dr. Yelverton  

## 2017-05-07 NOTE — ED Provider Notes (Signed)
[facility] EMERGENCY DEPARTMENT Provider Note   CSN: [id number] Arrival date & time: 05/07/17  1721     History   Chief Complaint Chief Complaint  Patient presents with  . Emesis    HPI Pedro [name] is a 22 y.o. male.  HPI Patient with history of type 1 diabetes presents with generalized fatigue for 2 days.  Has had multiple episodes of vomiting and endorses productive cough.  No known fever.  States he has had a sore throat.  No diarrhea.  Denies urinary symptoms.  States he is been compliant with his insulin.  No known sick contacts. Past Medical History:  Diagnosis Date  . ADHD (attention deficit hyperactivity disorder)   . DM type 1 (diabetes mellitus, type 1) ([place])    diagnosed age 45  . Herpes   . Noncompliance with medications     Patient Active Problem List   Diagnosis Date Noted  . DKA (diabetic ketoacidosis) ([place]) 11/09/2016  . PNA (pneumonia) 07/04/2016  . DM type 1 (diabetes mellitus, type 1) ([place]) 07/01/2016  . Noncompliance with medications 07/01/2016  . Polysubstance abuse ([place])   . Tachycardia 10/17/2014  . Acute renal failure ([place]) 10/17/2014  . DKA (diabetic ketoacidoses) ([place]) 03/19/2014  . Diabetic neuropathy ([place]) 03/19/2014  . Dehydration   . Gastroenteritis 02/16/2014  . Diabetic ketoacidosis without coma associated with type 1 diabetes mellitus ([place])   . Genital herpes simplex type 2 12/15/2013  . Tobacco abuse 12/15/2013  . DKA, type 1 ([place]) 12/15/2013  . Diabetic ketoacidosis ([place]) 10/05/2013  . Hyperkalemia 10/15/2012  . Leukocytosis 10/15/2012  . Noncompliance 10/15/2012  . Polysubstance dependence ([place]) 07/22/2011    Class: Acute  . Substance induced mood disorder ([place]) 07/22/2011    Class: Acute    Past Surgical History:  Procedure Laterality Date  . none         Home Medications    Prior to Admission medications   Medication Sig Start Date End Date Taking? Authorizing Provider  insulin glargine (LANTUS) 100  UNIT/ML injection Inject 0.4 mLs (40 Units total) into the skin daily. 11/10/16  Yes Kathie Dike, MD  insulin NPH-regular Human (NOVOLIN 70/30) (70-30) 100 UNIT/ML injection Inject 60 Units into the skin daily as needed (for high blood sugars).    Yes [provider]  ondansetron (ZOFRAN ODT) 4 MG disintegrating tablet 4mg  ODT q4 hours prn nausea/vomit 05/07/17   Julianne Rice, MD    Family History Family History  Problem Relation Age of Onset  . Diabetes Mellitus I Paternal Grandmother   . Diabetes Mellitus I Paternal Aunt     Social History Social History   Tobacco Use  . Smoking status: Current Every Day Smoker    Packs/day: 0.50    Years: 2.00    Pack years: 1.00    Types: Cigarettes  . Smokeless tobacco: Never Used  Substance Use Topics  . Alcohol use: No  . Drug use: Yes    Types: Marijuana, Other-see comments, Benzodiazepines, Hydrocodone, Oxycodone    Comment: occ     Allergies   Sulfa antibiotics   Review of Systems Review of Systems  Constitutional: Positive for appetite change and fatigue. Negative for chills and fever.  HENT: Positive for sore throat. Negative for congestion, sinus pressure and trouble swallowing.   Eyes: Negative for visual disturbance.  Respiratory: Positive for cough. Negative for shortness of breath and wheezing.   Cardiovascular: Negative for chest pain and palpitations.  Gastrointestinal: Positive for nausea and vomiting.  Negative for abdominal pain, constipation and diarrhea.  Genitourinary: Negative for difficulty urinating, flank pain, frequency and hematuria.  Musculoskeletal: Negative for back pain, myalgias, neck pain and neck stiffness.  Skin: Negative for rash and wound.  Neurological: Positive for weakness. Negative for dizziness, light-headedness, numbness and headaches.  All other systems reviewed and are negative.    Physical Exam Updated Vital Signs BP 117/82   Pulse 100   Temp 98.1 F (36.7 C)  (Temporal)   Resp 14   Ht 5\' 9"  (1.753 m)   Wt 57.6 kg (127 lb)   SpO2 100%   BMI 18.75 kg/m   Physical Exam  Constitutional: He is oriented to person, place, and time. He appears well-developed.  Chronically malnourished appearing  HENT:  Head: Normocephalic and atraumatic.  Oropharynx is mildly erythematous.  No appreciated tonsillar exudates.  Uvula is midline.  Eyes: EOM are normal. Pupils are equal, round, and reactive to light.  Neck: Normal range of motion. Neck supple.  No meningismus.  No cervical lymphadenopathy.  Cardiovascular: Regular rhythm. Exam reveals no gallop and no friction rub.  No murmur heard. Tachycardia.  Pulmonary/Chest: Effort normal and breath sounds normal. No stridor. No respiratory distress. He has no wheezes. He has no rales. He exhibits no tenderness.  Abdominal: Soft. Bowel sounds are normal. There is no tenderness. There is no rebound and no guarding.  Musculoskeletal: Normal range of motion. He exhibits no edema or tenderness.  No midline thoracic or lumbar tenderness.  No CVA tenderness.  No lower extremity swelling or asymmetry.  Distal pulses are 2+.  Neurological: He is alert and oriented to person, place, and time.  Moves all extremities without focal deficit.  Sensation intact.  Skin: Skin is warm and dry. Capillary refill takes less than 2 seconds. No rash noted. No erythema.  Psychiatric: He has a normal mood and affect. His behavior is normal.  Nursing note and vitals reviewed.    ED Treatments / Results  Labs (all labs ordered are listed, but only abnormal results are displayed) Labs Reviewed  CBC - Abnormal; Notable for the following components:      Result Value   WBC 17.1 (*)    RBC 6.17 (*)    Hemoglobin 18.4 (*)    HCT 54.3 (*)    All other components within normal limits  URINALYSIS, ROUTINE W REFLEX MICROSCOPIC - Abnormal; Notable for the following components:   Glucose, UA 50 (*)    Ketones, ur 20 (*)    Protein, ur  30 (*)    Squamous Epithelial / LPF 0-5 (*)    All other components within normal limits  COMPREHENSIVE METABOLIC PANEL - Abnormal; Notable for the following components:   Chloride 95 (*)    Glucose, Bld 134 (*)    BUN 68 (*)    Creatinine, Ser 1.29 (*)    Total Protein 8.2 (*)    ALT 13 (*)    Total Bilirubin 1.4 (*)    Anion gap 17 (*)    All other components within normal limits  RAPID URINE DRUG SCREEN, HOSP PERFORMED - Abnormal; Notable for the following components:   Cocaine POSITIVE (*)    Tetrahydrocannabinol POSITIVE (*)    All other components within normal limits  CBG MONITORING, ED - Abnormal; Notable for the following components:   Glucose-Capillary 101 (*)    All other components within normal limits  RAPID STREP SCREEN (NOT AT [facility])  CULTURE, GROUP A STREP ([place])  LIPASE,  BLOOD  CK  CBG MONITORING, ED    EKG  EKG Interpretation  Date/Time:  Sunday May 07 2017 19:31:06 EST Ventricular Rate:  110 PR Interval:    QRS Duration: 101 QT Interval:  337 QTC Calculation: 456 R Axis:   100 Text Interpretation:  Sinus tachycardia Right atrial enlargement Borderline right axis deviation ST elev, probable normal early repol pattern Confirmed by Haley Fuerstenberg ([id number]) on 05/07/2017 10:35:07 PM       Radiology Dg Chest 2 View  Result Date: 05/07/2017 CLINICAL DATA:  21 year old male with history of generalize weakness for the past 2 days. EXAM: CHEST  2 VIEW COMPARISON:  Chest x-ray 11/09/2016. FINDINGS: Lung volumes are normal. No consolidative airspace disease. No pleural effusions. No pneumothorax. No pulmonary nodule or mass noted. Pulmonary vasculature and the cardiomediastinal silhouette are within normal limits. IMPRESSION: No radiographic evidence of acute cardiopulmonary disease. Electronically Signed   By: Daniel  Entrikin M.D.   On: 05/07/2017 19:04    Procedures Procedures (including critical care time)  Medications Ordered in ED Medications  sodium  chloride 0.9 % bolus 2,000 mL (0 mLs Intravenous Stopped 05/07/17 2105)  ondansetron (ZOFRAN) injection 4 mg (4 mg Intravenous Given 05/07/17 1940)  acetaminophen (TYLENOL) tablet 1,000 mg (1,000 mg Oral Given 05/07/17 2105)     Initial Impression / Assessment and Plan / ED Course  I have reviewed the triage vital signs and the nursing notes.  Pertinent labs & imaging results that were available during my care of the patient were reviewed by me and considered in my medical decision making (see chart for details).     No evidence of DKA.  Patient does have elevation in creatinine over baseline.  Likely due to dehydration.  Given 2 L of IV fluids with improvement of tachycardia.  Urine drug screen positive for marijuana and cocaine.  No vomiting in the emergency department.  Abdominal exam remains benign.  Patient states he is feeling much better.  Will discharge home with ODT Zofran.  Encouraged to drink plenty fluids and avoid illegal drugs.  Return precautions given.  Final Clinical Impressions(s) / ED Diagnoses   Final diagnoses:  Dehydration  Nausea and vomiting in adult    ED Discharge Orders        Ordered    ondansetron (ZOFRAN ODT) 4 MG disintegrating tablet     03 /03/19 2228       Julianne Rice, MD 05/07/17 2236

## 2017-05-07 NOTE — ED Notes (Signed)
Room assignment is to be changed  Family is made aware by Ron, RN, CN  Awaiting bed assignment and transfer

## 2017-05-07 NOTE — ED Notes (Signed)
From Rad 

## 2017-05-07 NOTE — ED Notes (Signed)
Checked with pt for urine sample,pt states he can't go right now.Will try back in 75minutes

## 2017-05-07 NOTE — ED Notes (Signed)
Dr Y in to assess 

## 2017-05-07 NOTE — ED Triage Notes (Signed)
Pt reports emesis and generalized weakness x 2 days. Pt has Type 1 DM. Takes 70/30 insulin, took 90 units around midnight. CBG 101 in triage.

## 2017-05-10 LAB — CULTURE, GROUP A STREP (THRC)

## 2017-11-21 ENCOUNTER — Emergency Department (HOSPITAL_COMMUNITY)
Admission: EM | Admit: 2017-11-21 | Discharge: 2017-11-21 | Disposition: A | Discharge status: Home / Self Care | Payer: Self-pay | Attending: Emergency Medicine | Admitting: Emergency Medicine

## 2017-11-21 ENCOUNTER — Encounter (HOSPITAL_COMMUNITY): Payer: Self-pay | Admitting: Emergency Medicine

## 2017-11-21 ENCOUNTER — Other Ambulatory Visit: Payer: Self-pay

## 2017-11-21 DIAGNOSIS — R739 Hyperglycemia, unspecified: Secondary | ICD-10-CM

## 2017-11-21 DIAGNOSIS — E1040 Type 1 diabetes mellitus with diabetic neuropathy, unspecified: Secondary | ICD-10-CM | POA: Insufficient documentation

## 2017-11-21 DIAGNOSIS — F17210 Nicotine dependence, cigarettes, uncomplicated: Secondary | ICD-10-CM | POA: Insufficient documentation

## 2017-11-21 DIAGNOSIS — E1010 Type 1 diabetes mellitus with ketoacidosis without coma: Secondary | ICD-10-CM | POA: Insufficient documentation

## 2017-11-21 DIAGNOSIS — E1065 Type 1 diabetes mellitus with hyperglycemia: Secondary | ICD-10-CM | POA: Insufficient documentation

## 2017-11-21 LAB — CBC
HCT: 46.8 % (ref 39.0–52.0)
Hemoglobin: 15.2 g/dL (ref 13.0–17.0)
MCH: 30.4 pg (ref 26.0–34.0)
MCHC: 32.5 g/dL (ref 30.0–36.0)
MCV: 93.6 fL (ref 78.0–100.0)
PLATELETS: 308 10*3/uL (ref 150–400)
RBC: 5 MIL/uL (ref 4.22–5.81)
RDW: 13.3 % (ref 11.5–15.5)
WBC: 9.8 10*3/uL (ref 4.0–10.5)

## 2017-11-21 LAB — BASIC METABOLIC PANEL
Anion gap: 8 (ref 5–15)
BUN: 28 mg/dL — AB (ref 6–20)
CALCIUM: 9.5 mg/dL (ref 8.9–10.3)
CHLORIDE: 101 mmol/L (ref 98–111)
CO2: 27 mmol/L (ref 22–32)
CREATININE: 1.08 mg/dL (ref 0.61–1.24)
Glucose, Bld: 589 mg/dL (ref 70–99)
Potassium: 4.7 mmol/L (ref 3.5–5.1)
SODIUM: 136 mmol/L (ref 135–145)

## 2017-11-21 LAB — URINALYSIS, ROUTINE W REFLEX MICROSCOPIC
Bacteria, UA: NONE SEEN
Bilirubin Urine: NEGATIVE
HGB URINE DIPSTICK: NEGATIVE
KETONES UR: 20 mg/dL — AB
Leukocytes, UA: NEGATIVE
NITRITE: NEGATIVE
PH: 6 (ref 5.0–8.0)
Protein, ur: NEGATIVE mg/dL
Specific Gravity, Urine: 1.027 (ref 1.005–1.030)

## 2017-11-21 LAB — CBG MONITORING, ED
GLUCOSE-CAPILLARY: 590 mg/dL — AB (ref 70–99)
Glucose-Capillary: 366 mg/dL — ABNORMAL HIGH (ref 70–99)
Glucose-Capillary: 238 mg/dL — ABNORMAL HIGH (ref 70–99)

## 2017-11-21 MED ORDER — SODIUM CHLORIDE 0.9 % IV BOLUS
1000.0000 mL | Freq: Once | INTRAVENOUS | Status: AC
  Administered 2017-11-21: 1000 mL via INTRAVENOUS

## 2017-11-21 MED ORDER — INSULIN ASPART 100 UNIT/ML ~~LOC~~ SOLN
10.0000 [IU] | Freq: Once | SUBCUTANEOUS | Status: AC
  Administered 2017-11-21: 10 [IU] via INTRAVENOUS

## 2017-11-21 MED ORDER — INSULIN ASPART 100 UNIT/ML ~~LOC~~ SOLN
6.0000 [IU] | Freq: Once | SUBCUTANEOUS | Status: AC
  Administered 2017-11-21: 6 [IU] via INTRAVENOUS
  Filled 2017-11-21: qty 1

## 2017-11-21 NOTE — Discharge Instructions (Addendum)
Follow up with primary care doctor.

## 2017-11-21 NOTE — ED Triage Notes (Addendum)
Pt had to leave work from "sugar dropping". Pt states his work needs a note excusing him from leaving/missing work. Denies any symptoms.  CBG 590.  Pt states taking his medication today

## 2017-11-21 NOTE — ED Provider Notes (Signed)
[facility] EMERGENCY DEPARTMENT Provider Note   CSN: [id number] Arrival date & time: 11/21/17  1254     History   Chief Complaint Chief Complaint  Patient presents with  . Hyperglycemia    HPI Pedro [name] is a 22 y.o. male.  HPI Patient presents with hyperglycemia.  Patient came in because he reportedly needs a work note because his sugar had dropped work.  States he had not eaten as much and his sugar went down to the 50s and 60s.  States he needs a note saying he can go back to work.  However upon arrival found to have sugar of almost 600.  States he thinks he may have eaten too much this morning.  States he is on 70/30 because it is the cheapest and does not have insurance right now.  States he is trying to work a job so he can get more healthcare.  States he had a little nausea and vomiting.  States his son had the same and thinks he has a virus. Past Medical History:  Diagnosis Date  . ADHD (attention deficit hyperactivity disorder)   . DM type 1 (diabetes mellitus, type 1) ([place])    diagnosed age 67  . Herpes   . Noncompliance with medications     Patient Active Problem List   Diagnosis Date Noted  . DKA (diabetic ketoacidosis) ([place]) 11/09/2016  . PNA (pneumonia) 07/04/2016  . DM type 1 (diabetes mellitus, type 1) ([place]) 07/01/2016  . Noncompliance with medications 07/01/2016  . Polysubstance abuse ([employer])   . Tachycardia 10/17/2014  . Acute renal failure ([place]) 10/17/2014  . DKA (diabetic ketoacidoses) ([place]) 03/19/2014  . Diabetic neuropathy ([place]) 03/19/2014  . Dehydration   . Gastroenteritis 02/16/2014  . Diabetic ketoacidosis without coma associated with type 1 diabetes mellitus ([place])   . Genital herpes simplex type 2 12/15/2013  . Tobacco abuse 12/15/2013  . DKA, type 1 ([place]) 12/15/2013  . Diabetic ketoacidosis ([place]) 10/05/2013  . Hyperkalemia 10/15/2012  . Leukocytosis 10/15/2012  . Noncompliance 10/15/2012  . Polysubstance dependence ([place]) 07/22/2011      Class: Acute  . Substance induced mood disorder ([place]) 07/22/2011    Class: Acute    Past Surgical History:  Procedure Laterality Date  . none          Home Medications    Prior to Admission medications   Medication Sig Start Date End Date Taking? Authorizing Provider  insulin NPH-regular Human (NOVOLIN 70/30) (70-30) 100 UNIT/ML injection Inject 60 Units into the skin daily as needed (for high blood sugars).    Yes [provider]  insulin glargine (LANTUS) 100 UNIT/ML injection Inject 0.4 mLs (40 Units total) into the skin daily. Patient not taking: Reported on 11/21/2017 11/10/16   Kathie Dike, MD  ondansetron (ZOFRAN ODT) 4 MG disintegrating tablet 4mg  ODT q4 hours prn nausea/vomit Patient not taking: Reported on 11/21/2017 05/07/17   Julianne Rice, MD    Family History Family History  Problem Relation Age of Onset  . Diabetes Mellitus I Paternal Grandmother   . Diabetes Mellitus I Paternal Aunt     Social History Social History   Tobacco Use  . Smoking status: Current Every Day Smoker    Packs/day: 0.50    Years: 2.00    Pack years: 1.00    Types: Cigarettes  . Smokeless tobacco: Never Used  Substance Use Topics  . Alcohol use: No  . Drug use: Yes    Types: Marijuana, Other-see comments, Benzodiazepines,  Hydrocodone, Oxycodone    Comment: occ     Allergies   Sulfa antibiotics   Review of Systems Review of Systems  Constitutional: Negative for appetite change.  HENT: Negative for congestion.   Respiratory: Negative for shortness of breath.   Cardiovascular: Negative for chest pain.  Gastrointestinal: Negative for abdominal distention.  Genitourinary: Negative for flank pain.  Musculoskeletal: Negative for back pain.  Neurological: Negative for seizures.  Psychiatric/Behavioral: Positive for confusion.     Physical Exam Updated Vital Signs BP (!) 129/97 (BP Location: Right Arm)   Pulse 74   Temp 97.9 F (36.6 C) (Oral)   Resp 16    Ht 5\' 11"  (1.803 m)   Wt 68 kg   SpO2 100%   BMI 20.92 kg/m   Physical Exam  Constitutional: He appears well-developed.  HENT:  Head: Normocephalic.  Eyes: Pupils are equal, round, and reactive to light.  Neck: Neck supple.  Cardiovascular:  Mild tachycardia  Pulmonary/Chest: Effort normal.  Abdominal: Soft.  Musculoskeletal: He exhibits no edema.  Neurological: He is alert.  Skin: Skin is warm. Capillary refill takes less than 2 seconds.     ED Treatments / Results  Labs (all labs ordered are listed, but only abnormal results are displayed) Labs Reviewed  BASIC METABOLIC PANEL - Abnormal; Notable for the following components:      Result Value   Glucose, Bld 589 (*)    BUN 28 (*)    All other components within normal limits  URINALYSIS, ROUTINE W REFLEX MICROSCOPIC - Abnormal; Notable for the following components:   Color, Urine STRAW (*)    Glucose, UA >=500 (*)    Ketones, ur 20 (*)    All other components within normal limits  CBG MONITORING, ED - Abnormal; Notable for the following components:   Glucose-Capillary 590 (*)    All other components within normal limits  CBG MONITORING, ED - Abnormal; Notable for the following components:   Glucose-Capillary 366 (*)    All other components within normal limits  CBG MONITORING, ED - Abnormal; Notable for the following components:   Glucose-Capillary 238 (*)    All other components within normal limits  CBC    EKG None  Radiology No results found.  Procedures Procedures (including critical care time)  Medications Ordered in ED Medications  sodium chloride 0.9 % bolus 1,000 mL (0 mLs Intravenous Stopped 11/21/17 1510)  insulin aspart (novoLOG) injection 10 Units (10 Units Intravenous Given 11/21/17 1458)  sodium chloride 0.9 % bolus 1,000 mL (0 mLs Intravenous Stopped 11/21/17 1723)  insulin aspart (novoLOG) injection 6 Units (6 Units Intravenous Given 11/21/17 1557)     Initial Impression / Assessment  and Plan / ED Course  I have reviewed the triage vital signs and the nursing notes.  Pertinent labs & imaging results that were available during my care of the patient were reviewed by me and considered in my medical decision making (see chart for details).     Patient with hyperglycemia and without DKA.  Improved after fluids and insulin.  Discharge home.  States that he has insulin at home.  Final Clinical Impressions(s) / ED Diagnoses   Final diagnoses:  Hyperglycemia    ED Discharge Orders    None       Davonna Belling, MD 11/21/17 2032

## 2017-11-21 NOTE — ED Notes (Signed)
Pt given water at this time 

## 2017-11-21 NOTE — ED Notes (Signed)
Date and time results received: 11/21/17 2:12 PM  Test: Glucose Critical Value: 589  Name of Provider Notified: Alvino Chapel  Orders Received? Or Actions Taken?:  No new orders at this time

## 2018-06-01 ENCOUNTER — Ambulatory Visit: Payer: Self-pay | Admitting: Family Medicine

## 2018-06-05 ENCOUNTER — Ambulatory Visit: Payer: Self-pay | Admitting: Family Medicine

## 2018-08-03 ENCOUNTER — Encounter: Payer: Self-pay | Admitting: Family Medicine

## 2018-08-03 ENCOUNTER — Other Ambulatory Visit: Payer: Self-pay

## 2018-08-03 ENCOUNTER — Ambulatory Visit (INDEPENDENT_AMBULATORY_CARE_PROVIDER_SITE_OTHER): Payer: BLUE CROSS/BLUE SHIELD | Admitting: Family Medicine

## 2018-08-03 VITALS — BP 120/78 | HR 94 | Temp 98.1°F | Ht 71.0 in | Wt 143.0 lb

## 2018-08-03 DIAGNOSIS — E109 Type 1 diabetes mellitus without complications: Secondary | ICD-10-CM | POA: Diagnosis not present

## 2018-08-03 DIAGNOSIS — K047 Periapical abscess without sinus: Secondary | ICD-10-CM | POA: Diagnosis not present

## 2018-08-03 DIAGNOSIS — E10649 Type 1 diabetes mellitus with hypoglycemia without coma: Secondary | ICD-10-CM

## 2018-08-03 LAB — BAYER DCA HB A1C WAIVED: HB A1C (BAYER DCA - WAIVED): 10.6 % — ABNORMAL HIGH (ref <7.0)

## 2018-08-03 MED ORDER — INSULIN GLARGINE 100 UNIT/ML SOLOSTAR PEN: 50.0000 [IU] | PEN_INJECTOR | SUBCUTANEOUS | 99 refills | Status: DC

## 2018-08-03 MED ORDER — AMOXICILLIN-POT CLAVULANATE 875-125 MG PO TABS: 1.0000 | ORAL_TABLET | Freq: Two times a day (BID) | ORAL | 0 refills | Status: DC

## 2018-08-03 NOTE — Progress Notes (Signed)
Subjective:  Patient ID: Pedro [name], male    DOB: [birth year]  Age: 23 y.o. MRN: [id number]  CC: New Patient (Initial Visit)   HPI YAO [name] presents for establish as a patient here for care primarily of his type 1 diabetes.  He is now 66 and was diagnosed at age 1.  His insurance ran out so he has been getting over-the-counter 70/30 and taking 70 units at 10:30 PM.  He worked nights until he was recently laid off due to the cutbacks from the [place] pandemic.  He says that his sugar is generally in the 100s in the evening and during the 80s during the night.  He would like to go back on Lantus insulin now that he has some insurance.  He tells me at the times his sugar drops around the 80s he will start feeling delirious and nauseous.  Depression screen [facility] 2/9 08/03/2018 04/08/2014  Decreased Interest 0 1  Down, Depressed, Hopeless 0 1  PHQ - 2 Score 0 2  Altered sleeping 0 0  Tired, decreased energy 0 2  Change in appetite 0 1  Feeling bad or failure about yourself  0 0  Trouble concentrating 0 0  Moving slowly or fidgety/restless 0 0  Suicidal thoughts 0 0  PHQ-9 Score 0 5    History Curran has a past medical history of ADHD (attention deficit hyperactivity disorder), DM type 1 (diabetes mellitus, type 1) ([place]), Herpes, and Noncompliance with medications.   He has a past surgical history that includes none.   His family history includes Anxiety disorder in his mother; Diabetes Mellitus I in his paternal aunt and paternal grandmother; Hypertension in his father and mother.He reports that he has been smoking cigarettes. He has a 5.00 pack-year smoking history. He has never used smokeless tobacco. He reports previous drug use. Drugs: Marijuana, Other-see comments, Benzodiazepines, Hydrocodone, and Oxycodone. He reports that he does not drink alcohol.    ROS Review of Systems  Constitutional: Negative for activity change, fatigue and unexpected weight change.  HENT:  Positive for mouth sores (Dental decay on the right with severe pain noted.). Negative for congestion, ear pain, hearing loss, postnasal drip and trouble swallowing.   Eyes: Negative for pain and visual disturbance.  Respiratory: Negative for cough, chest tightness and shortness of breath.   Cardiovascular: Negative for chest pain, palpitations and leg swelling.  Gastrointestinal: Negative for abdominal distention, abdominal pain, blood in stool, constipation, diarrhea, nausea and vomiting.  Endocrine: Negative for cold intolerance, heat intolerance and polydipsia.  Genitourinary: Negative for difficulty urinating, dysuria, flank pain, frequency and urgency.  Musculoskeletal: Negative for arthralgias and joint swelling.  Skin: Negative for color change, rash and wound.  Neurological: Negative for dizziness, syncope, speech difficulty, weakness, light-headedness, numbness and headaches.  Hematological: Does not bruise/bleed easily.  Psychiatric/Behavioral: Negative for confusion, decreased concentration, dysphoric mood and sleep disturbance. The patient is not nervous/anxious.     Objective:  BP 120/78   Pulse 94   Temp 98.1 F (36.7 C) (Oral)   Ht _0  (1.803 m)   Wt 143 lb (64.9 kg)   BMI 19.94 kg/m   BP Readings from Last 3 Encounters:  08/03/18 120/78  11/21/17 (!) 129/97  05/07/17 123/82    Wt Readings from Last 3 Encounters:  08/03/18 143 lb (64.9 kg)  11/21/17 150 lb (68 kg)  05/07/17 127 lb (57.6 kg)     Physical Exam Constitutional:      General: He  is not in acute distress.    Appearance: He is well-developed.  HENT:     Head: Normocephalic and atraumatic.     Right Ear: External ear normal.     Left Ear: External ear normal.     Nose: Nose normal.     Mouth/Throat:     Mouth: Mucous membranes are moist.     Comments: There is severe decay of the right lower second molar with surrounding inflammation and erythema.  He tells me he cannot get in with a dentist  due to the COVID epidemic. Eyes:     Conjunctiva/sclera: Conjunctivae normal.     Pupils: Pupils are equal, round, and reactive to light.  Neck:     Musculoskeletal: Normal range of motion and neck supple.  Cardiovascular:     Rate and Rhythm: Normal rate and regular rhythm.     Heart sounds: Normal heart sounds. No murmur.  Pulmonary:     Effort: Pulmonary effort is normal. No respiratory distress.     Breath sounds: Normal breath sounds. No wheezing or rales.  Abdominal:     Palpations: Abdomen is soft.     Tenderness: There is no abdominal tenderness.  Musculoskeletal: Normal range of motion.  Skin:    General: Skin is warm and dry.  Neurological:     Mental Status: He is alert and oriented to person, place, and time.     Deep Tendon Reflexes: Reflexes are normal and symmetric.  Psychiatric:        Behavior: Behavior normal.        Thought Content: Thought content normal.        Judgment: Judgment normal.       Assessment & Plan:   Bless was seen today for new patient (initial visit).  Diagnoses and all orders for this visit:  Type 1 diabetes mellitus without complications ([place]) -     CBC with Differential/Platelet -     CMP14+EGFR -     Bayer DCA Hb A1c Waived  Other orders -     Insulin Glargine (LANTUS SOLOSTAR) 100 UNIT/ML Solostar Pen; Inject 50 Units into the skin every morning. -     amoxicillin-clavulanate (AUGMENTIN) 875-125 MG tablet; Take 1 tablet by mouth 2 (two) times daily. Take all of this medication       I have discontinued Amante A. Mcraney's insulin glargine and ondansetron. I am also having him start on Insulin Glargine and amoxicillin-clavulanate. Additionally, I am having him maintain his insulin NPH-regular Human.  Allergies as of 08/03/2018      Reactions   Sulfa Antibiotics Hives, Other (See Comments)   Reaction:  Fever      Medication List       Accurate as of Aug 03, 2018  6:17 PM. If you have any questions, ask your nurse  or doctor.        STOP taking these medications   insulin glargine 100 UNIT/ML injection Commonly known as:  LANTUS Replaced by:  Insulin Glargine 100 UNIT/ML Solostar Pen Stopped by:  Claretta Fraise, MD   ondansetron 4 MG disintegrating tablet Commonly known as:  Zofran ODT Stopped by:  Claretta Fraise, MD     TAKE these medications   amoxicillin-clavulanate 875-125 MG tablet Commonly known as:  AUGMENTIN Take 1 tablet by mouth 2 (two) times daily. Take all of this medication Started by:  Claretta Fraise, MD   Insulin Glargine 100 UNIT/ML Solostar Pen Commonly known as:  Lantus SoloStar Inject 50 Units  into the skin every morning. Replaces:  insulin glargine 100 UNIT/ML injection Started by:  Claretta Fraise, MD   NovoLIN 70/30 (70-30) 100 UNIT/ML injection Generic drug:  insulin NPH-regular Human Inject 70 Units into the skin daily with supper.        Follow-up: No follow-ups on file.  Claretta Fraise, M.D.

## 2018-08-04 LAB — CMP14+EGFR
ALT: 7 IU/L (ref 0–44)
AST: 8 IU/L (ref 0–40)
Albumin/Globulin Ratio: 2 (ref 1.2–2.2)
Albumin: 3.9 g/dL — ABNORMAL LOW (ref 4.1–5.2)
Alkaline Phosphatase: 76 IU/L (ref 39–117)
BUN/Creatinine Ratio: 24 — ABNORMAL HIGH (ref 9–20)
BUN: 24 mg/dL — ABNORMAL HIGH (ref 6–20)
Bilirubin Total: 0.3 mg/dL (ref 0.0–1.2)
CO2: 24 mmol/L (ref 20–29)
Calcium: 8.9 mg/dL (ref 8.7–10.2)
Chloride: 103 mmol/L (ref 96–106)
Creatinine, Ser: 1 mg/dL (ref 0.76–1.27)
GFR calc Af Amer: 121 mL/min/{1.73_m2} (ref >59)
GFR calc non Af Amer: 105 mL/min/{1.73_m2} (ref >59)
Globulin, Total: 2 g/dL (ref 1.5–4.5)
Glucose: 328 mg/dL — ABNORMAL HIGH (ref 65–99)
Potassium: 4.6 mmol/L (ref 3.5–5.2)
Sodium: 141 mmol/L (ref 134–144)
Total Protein: 5.9 g/dL — ABNORMAL LOW (ref 6.0–8.5)

## 2018-08-04 LAB — CBC WITH DIFFERENTIAL/PLATELET
Basophils Absolute: 0.1 10*3/uL (ref 0.0–0.2)
Basos: 1 %
EOS (ABSOLUTE): 0.3 10*3/uL (ref 0.0–0.4)
Eos: 3 %
Hematocrit: 40.1 % (ref 37.5–51.0)
Hemoglobin: 13.8 g/dL (ref 13.0–17.7)
Immature Grans (Abs): 0 10*3/uL (ref 0.0–0.1)
Immature Granulocytes: 0 %
Lymphocytes Absolute: 3.5 10*3/uL — ABNORMAL HIGH (ref 0.7–3.1)
Lymphs: 31 %
MCH: 31.5 pg (ref 26.6–33.0)
MCHC: 34.4 g/dL (ref 31.5–35.7)
MCV: 92 fL (ref 79–97)
Monocytes Absolute: 0.4 10*3/uL (ref 0.1–0.9)
Monocytes: 4 %
Neutrophils Absolute: 6.9 10*3/uL (ref 1.4–7.0)
Neutrophils: 61 %
Platelets: 213 10*3/uL (ref 150–450)
RBC: 4.38 x10E6/uL (ref 4.14–5.80)
RDW: 12.8 % (ref 11.6–15.4)
WBC: 11.1 10*3/uL — ABNORMAL HIGH (ref 3.4–10.8)

## 2018-08-16 ENCOUNTER — Other Ambulatory Visit: Payer: Self-pay

## 2018-08-17 ENCOUNTER — Ambulatory Visit: Payer: BLUE CROSS/BLUE SHIELD | Admitting: Family Medicine

## 2018-08-24 ENCOUNTER — Encounter: Payer: Self-pay | Admitting: Family Medicine

## 2018-08-24 ENCOUNTER — Other Ambulatory Visit: Payer: Self-pay

## 2018-08-24 ENCOUNTER — Ambulatory Visit (INDEPENDENT_AMBULATORY_CARE_PROVIDER_SITE_OTHER): Payer: BC Managed Care – PPO | Admitting: Family Medicine

## 2018-08-24 VITALS — BP 124/83 | HR 107 | Temp 97.6°F | Ht 71.0 in | Wt 130.0 lb

## 2018-08-24 DIAGNOSIS — H6001 Abscess of right external ear: Secondary | ICD-10-CM

## 2018-08-27 LAB — CULTURE, GROUP A STREP: Strep A Culture: NEGATIVE

## 2018-08-28 ENCOUNTER — Encounter: Payer: Self-pay | Admitting: Family Medicine

## 2018-08-28 MED ORDER — AMOXICILLIN-POT CLAVULANATE 875-125 MG PO TABS: 1.0000 | ORAL_TABLET | Freq: Two times a day (BID) | ORAL | 0 refills | Status: DC

## 2018-08-28 NOTE — Progress Notes (Signed)
Chief Complaint  Patient presents with  . Diabetes  . sore on right ear    HPI  Patient presents today for increasing pain and swelling in right earlobe over 3-4 days. Denies use of earring. No fever.   PMH: Smoking status noted ROS: Per HPI  Objective: BP 124/83   Pulse (!) 107   Temp 97.6 F (36.4 C) (Oral)   Ht 5\' 11"  (1.803 m)   Wt 130 lb (59 kg)   BMI 18.13 kg/m  Gen: NAD, alert, cooperative with exam HEENT: NCAT, EOMI, PERRL Right posterior ear lobe has 1.5 cm raised fluctuant mass.  .I&D Incision was made centrally with 11 blade scalpel at the central aspect of the wound. Significant  purulent drainage was exuded. Culture was taken. Forceps was used to probe the area and break apart loculations.Dressing was placed over top. Bleeding was minimal and patient tolerated procedure well.   Assessment and plan:  1. Abscess, earlobe, right     Meds ordered this encounter  Medications  . amoxicillin-clavulanate (AUGMENTIN) 875-125 MG tablet    Sig: Take 1 tablet by mouth 2 (two) times daily. Take all of this medication    Dispense:  20 tablet    Refill:  0    Orders Placed This Encounter  Procedures  . Culture, Group A Strep    Order Specific Question:   Source    Answer:   right ear lobe    Follow up as needed.  Claretta Fraise, MD

## 2018-09-16 ENCOUNTER — Other Ambulatory Visit: Payer: Self-pay

## 2018-09-16 ENCOUNTER — Emergency Department (HOSPITAL_COMMUNITY): Payer: BC Managed Care – PPO

## 2018-09-16 ENCOUNTER — Encounter (HOSPITAL_COMMUNITY): Payer: Self-pay | Admitting: *Deleted

## 2018-09-16 ENCOUNTER — Emergency Department (HOSPITAL_COMMUNITY)
Admission: EM | Admit: 2018-09-16 | Discharge: 2018-09-16 | Disposition: A | Discharge status: Home / Self Care | Payer: BC Managed Care – PPO | Attending: Emergency Medicine | Admitting: Emergency Medicine

## 2018-09-16 DIAGNOSIS — R112 Nausea with vomiting, unspecified: Secondary | ICD-10-CM | POA: Diagnosis not present

## 2018-09-16 DIAGNOSIS — R1110 Vomiting, unspecified: Secondary | ICD-10-CM | POA: Diagnosis not present

## 2018-09-16 DIAGNOSIS — K449 Diaphragmatic hernia without obstruction or gangrene: Secondary | ICD-10-CM | POA: Diagnosis not present

## 2018-09-16 DIAGNOSIS — Z20828 Contact with and (suspected) exposure to other viral communicable diseases: Secondary | ICD-10-CM | POA: Insufficient documentation

## 2018-09-16 DIAGNOSIS — E109 Type 1 diabetes mellitus without complications: Secondary | ICD-10-CM | POA: Diagnosis not present

## 2018-09-16 DIAGNOSIS — E860 Dehydration: Secondary | ICD-10-CM | POA: Insufficient documentation

## 2018-09-16 DIAGNOSIS — F17210 Nicotine dependence, cigarettes, uncomplicated: Secondary | ICD-10-CM | POA: Diagnosis not present

## 2018-09-16 DIAGNOSIS — R1084 Generalized abdominal pain: Secondary | ICD-10-CM | POA: Insufficient documentation

## 2018-09-16 LAB — COMPREHENSIVE METABOLIC PANEL
ALT: 14 U/L (ref 0–44)
AST: 14 U/L — ABNORMAL LOW (ref 15–41)
Albumin: 4.3 g/dL (ref 3.5–5.0)
Alkaline Phosphatase: 106 U/L (ref 38–126)
Anion gap: 14 (ref 5–15)
BUN: 68 mg/dL — ABNORMAL HIGH (ref 6–20)
CO2: 28 mmol/L (ref 22–32)
Calcium: 9.3 mg/dL (ref 8.9–10.3)
Chloride: 91 mmol/L — ABNORMAL LOW (ref 98–111)
Creatinine, Ser: 1.37 mg/dL — ABNORMAL HIGH (ref 0.61–1.24)
GFR calc Af Amer: >60 mL/min (ref >60)
GFR calc non Af Amer: >60 mL/min (ref >60)
Glucose, Bld: 147 mg/dL — ABNORMAL HIGH (ref 70–99)
Potassium: 3.4 mmol/L — ABNORMAL LOW (ref 3.5–5.1)
Sodium: 133 mmol/L — ABNORMAL LOW (ref 135–145)
Total Bilirubin: 1.2 mg/dL (ref 0.3–1.2)
Total Protein: 8.1 g/dL (ref 6.5–8.1)

## 2018-09-16 LAB — BLOOD GAS, VENOUS
Acid-Base Excess: 4.9 mmol/L — ABNORMAL HIGH (ref 0.0–2.0)
Bicarbonate: 28.9 mmol/L — ABNORMAL HIGH (ref 20.0–28.0)
FIO2: 21
O2 Saturation: 90.7 %
Patient temperature: 36.5
pCO2, Ven: 37.7 mmHg — ABNORMAL LOW (ref 44.0–60.0)
pH, Ven: 7.489 — ABNORMAL HIGH (ref 7.250–7.430)
pO2, Ven: 56.2 mmHg — ABNORMAL HIGH (ref 32.0–45.0)

## 2018-09-16 LAB — CBC WITH DIFFERENTIAL/PLATELET
Abs Immature Granulocytes: 0.07 10*3/uL (ref 0.00–0.07)
Basophils Absolute: 0.1 10*3/uL (ref 0.0–0.1)
Basophils Relative: 0 %
Eosinophils Absolute: 0 10*3/uL (ref 0.0–0.5)
Eosinophils Relative: 0 %
HCT: 55.4 % — ABNORMAL HIGH (ref 39.0–52.0)
Hemoglobin: 19 g/dL — ABNORMAL HIGH (ref 13.0–17.0)
Immature Granulocytes: 0 %
Lymphocytes Relative: 13 %
Lymphs Abs: 2.2 10*3/uL (ref 0.7–4.0)
MCH: 29.9 pg (ref 26.0–34.0)
MCHC: 34.3 g/dL (ref 30.0–36.0)
MCV: 87.1 fL (ref 80.0–100.0)
Monocytes Absolute: 1.2 10*3/uL — ABNORMAL HIGH (ref 0.1–1.0)
Monocytes Relative: 7 %
Neutro Abs: 13.3 10*3/uL — ABNORMAL HIGH (ref 1.7–7.7)
Neutrophils Relative %: 80 %
Platelets: 347 10*3/uL (ref 150–400)
RBC: 6.36 MIL/uL — ABNORMAL HIGH (ref 4.22–5.81)
RDW: 13.2 % (ref 11.5–15.5)
WBC: 16.8 10*3/uL — ABNORMAL HIGH (ref 4.0–10.5)
nRBC: 0 % (ref 0.0–0.2)

## 2018-09-16 LAB — CBG MONITORING, ED: Glucose-Capillary: 158 mg/dL — ABNORMAL HIGH (ref 70–99)

## 2018-09-16 LAB — LIPASE, BLOOD: Lipase: 21 U/L (ref 11–51)

## 2018-09-16 LAB — SARS CORONAVIRUS 2 BY RT PCR (HOSPITAL ORDER, PERFORMED IN ~~LOC~~ HOSPITAL LAB): SARS Coronavirus 2: NEGATIVE

## 2018-09-16 MED ORDER — POTASSIUM CHLORIDE CRYS ER 20 MEQ PO TBCR
40.0000 meq | EXTENDED_RELEASE_TABLET | Freq: Once | ORAL | Status: DC
  Filled 2018-09-16: qty 2

## 2018-09-16 MED ORDER — SODIUM CHLORIDE 0.9 % IV BOLUS
1000.0000 mL | Freq: Once | INTRAVENOUS | Status: AC
  Administered 2018-09-16: 1000 mL via INTRAVENOUS

## 2018-09-16 MED ORDER — IOHEXOL 300 MG/ML  SOLN
100.0000 mL | Freq: Once | INTRAMUSCULAR | Status: AC | PRN
  Administered 2018-09-16: 100 mL via INTRAVENOUS

## 2018-09-16 MED ORDER — ONDANSETRON 4 MG PO TBDP: 4.0000 mg | ORAL_TABLET | Freq: Three times a day (TID) | ORAL | 0 refills | Status: DC | PRN

## 2018-09-16 MED ORDER — ONDANSETRON HCL 4 MG/2ML IJ SOLN
4.0000 mg | Freq: Once | INTRAMUSCULAR | Status: AC
  Administered 2018-09-16: 4 mg via INTRAVENOUS
  Filled 2018-09-16: qty 2

## 2018-09-16 NOTE — ED Notes (Signed)
Patient transported to CT 

## 2018-09-16 NOTE — ED Provider Notes (Signed)
Care transferred to me.  CT scan is unremarkable.  He has received his IV fluids and is feeling somewhat better.  No current vomiting.  Will discharge with Zofran and he probably has a viral GI illness.  Discussed return precautions.  Results for orders placed or performed during the hospital encounter of 09/16/18  SARS Coronavirus 2 (CEPHEID - Performed in [place] hospital lab), [facility] Order   Specimen: Nasopharyngeal Swab  Result Value Ref Range   SARS Coronavirus 2 NEGATIVE NEGATIVE  Comprehensive metabolic panel  Result Value Ref Range   Sodium 133 (L) 135 - 145 mmol/L   Potassium 3.4 (L) 3.5 - 5.1 mmol/L   Chloride 91 (L) 98 - 111 mmol/L   CO2 28 22 - 32 mmol/L   Glucose, Bld 147 (H) 70 - 99 mg/dL   BUN 68 (H) 6 - 20 mg/dL   Creatinine, Ser 1.37 (H) 0.61 - 1.24 mg/dL   Calcium 9.3 8.9 - 10.3 mg/dL   Total Protein 8.1 6.5 - 8.1 g/dL   Albumin 4.3 3.5 - 5.0 g/dL   AST 14 (L) 15 - 41 U/L   ALT 14 0 - 44 U/L   Alkaline Phosphatase 106 38 - 126 U/L   Total Bilirubin 1.2 0.3 - 1.2 mg/dL   GFR calc non Af Amer >60 >60 mL/min   GFR calc Af Amer >60 >60 mL/min   Anion gap 14 5 - 15  CBC with Differential  Result Value Ref Range   WBC 16.8 (H) 4.0 - 10.5 K/uL   RBC 6.36 (H) 4.22 - 5.81 MIL/uL   Hemoglobin 19.0 (H) 13.0 - 17.0 g/dL   HCT 55.4 (H) 39.0 - 52.0 %   MCV 87.1 80.0 - 100.0 fL   MCH 29.9 26.0 - 34.0 pg   MCHC 34.3 30.0 - 36.0 g/dL   RDW 13.2 11.5 - 15.5 %   Platelets 347 150 - 400 K/uL   nRBC 0.0 0.0 - 0.2 %   Neutrophils Relative % 80 %   Neutro Abs 13.3 (H) 1.7 - 7.7 K/uL   Lymphocytes Relative 13 %   Lymphs Abs 2.2 0.7 - 4.0 K/uL   Monocytes Relative 7 %   Monocytes Absolute 1.2 (H) 0.1 - 1.0 K/uL   Eosinophils Relative 0 %   Eosinophils Absolute 0.0 0.0 - 0.5 K/uL   Basophils Relative 0 %   Basophils Absolute 0.1 0.0 - 0.1 K/uL   Immature Granulocytes 0 %   Abs Immature Granulocytes 0.07 0.00 - 0.07 K/uL  Blood gas, venous (at [facility] and [facility], not at [facility])   Result Value Ref Range   FIO2 21.00    pH, Ven 7.489 (H) 7.250 - 7.430   pCO2, Ven 37.7 (L) 44.0 - 60.0 mmHg   pO2, Ven 56.2 (H) 32.0 - 45.0 mmHg   Bicarbonate 28.9 (H) 20.0 - 28.0 mmol/L   Acid-Base Excess 4.9 (H) 0.0 - 2.0 mmol/L   O2 Saturation 90.7 %   Patient temperature 36.5   Lipase, blood  Result Value Ref Range   Lipase 21 11 - 51 U/L  CBG monitoring, ED  Result Value Ref Range   Glucose-Capillary 158 (H) 70 - 99 mg/dL   Ct Abdomen Pelvis W Contrast  Result Date: 09/16/2018 CLINICAL DATA:  Abdominal pain and emesis EXAM: CT ABDOMEN AND PELVIS WITH CONTRAST TECHNIQUE: Multidetector CT imaging of the abdomen and pelvis was performed using the standard protocol following bolus administration of intravenous contrast. CONTRAST:  125mL OMNIPAQUE IOHEXOL  300 MG/ML  SOLN COMPARISON:  None. FINDINGS: Lower chest: Lung bases are clear.  There is a small hiatal hernia. Hepatobiliary: No focal liver lesions are appreciable. Gallbladder wall is not appreciably thickened. There is no biliary duct dilatation. Pancreas: There is no pancreatic mass or inflammatory focus. Spleen: No splenic lesions are evident. Adrenals/Urinary Tract: Adrenals bilaterally appear normal. Kidneys bilaterally show no evident mass or hydronephrosis on either side. There is no demonstrable renal or ureteral calculus on either side. Urinary bladder is midline with wall thickness within normal limits. Stomach/Bowel: There is no appreciable bowel wall or mesenteric thickening. There is moderate stool in the colon. There is no appreciable bowel obstruction. The terminal ileum appears unremarkable. There is no free air or portal venous air. Vascular/Lymphatic: There is no abdominal aortic aneurysm. No vascular lesions are demonstrable. There is no adenopathy in the abdomen or pelvis. Reproductive: Prostate and seminal vesicles appear normal in size and contour. There is no evident pelvic mass. Other: No appendiceal region  inflammatory change noted. No abscess or ascites evident in the abdomen or pelvis. Musculoskeletal: There are no blastic or lytic bone lesions. There is no intramuscular or abdominal wall lesion evident. IMPRESSION: 1. A cause for patient's symptoms has not been established with this study. 2. No bowel obstruction. No abscess in the abdomen pelvis. No periappendiceal region inflammatory change. 3. No renal or ureteral calculus. No hydronephrosis. Urinary bladder wall thickness within normal limits. 4.  Hiatal hernia noted. Electronically Signed   By: Lowella Grip III M.D.   On: 09/16/2018 15:36   Dg Chest Portable 1 View  Result Date: 09/16/2018 CLINICAL DATA:  Emesis for past few days. EXAM: PORTABLE CHEST 1 VIEW COMPARISON:  May 07, 2017 FINDINGS: The heart size and mediastinal contours are within normal limits. Both lungs are clear. The visualized skeletal structures are unremarkable. IMPRESSION: No active disease. Electronically Signed   By: Fidela Salisbury M.D.   On: 09/16/2018 14:22      Sherwood Gambler, MD 09/16/18 [phone]

## 2018-09-16 NOTE — Discharge Instructions (Addendum)
If you develop worsening, continued, or recurrent abdominal pain, uncontrolled vomiting, fever, chest or back pain, or any other new/concerning symptoms then return to the ER for evaluation.  

## 2018-09-16 NOTE — ED Triage Notes (Signed)
Pt with emesis for past few days, states CBG have been elevated at home.

## 2018-09-16 NOTE — ED Provider Notes (Signed)
Emergency Department Provider Note   I have reviewed the triage vital signs and the nursing notes.   HISTORY  Chief Complaint Hyperglycemia   HPI Pedro [name] is a 23 y.o. male with PMH of IDDM and ADHD presents to the ED with 3 days of hyperglycemia, vomiting, and SOB. He reports that his PCP changed him back to 50U of Lantus from his 70/30 two weeks prior. Patient tells me that he is compliant but feels that he needs the fast acting option at home as well. He has had multiple episodes of nausea/vomiting and associated abdominal cramping. No focal or unilateral pain. No fever. Patient with mild respiratory symptoms/congestion. No CP. No blood in the emesis.   Past Medical History:  Diagnosis Date  . ADHD (attention deficit hyperactivity disorder)   . DM type 1 (diabetes mellitus, type 1) ([place])    diagnosed age 69  . Herpes   . Noncompliance with medications     Patient Active Problem List   Diagnosis Date Noted  . DKA (diabetic ketoacidosis) ([place]) 11/09/2016  . PNA (pneumonia) 07/04/2016  . DM type 1 (diabetes mellitus, type 1) ([place]) 07/01/2016  . Noncompliance with medications 07/01/2016  . Polysubstance abuse ([place])   . Tachycardia 10/17/2014  . Acute renal failure ([place]) 10/17/2014  . DKA (diabetic ketoacidoses) ([place]) 03/19/2014  . Diabetic neuropathy ([place]) 03/19/2014  . Dehydration   . Gastroenteritis 02/16/2014  . Diabetic ketoacidosis without coma associated with type 1 diabetes mellitus ([place])   . Genital herpes simplex type 2 12/15/2013  . Tobacco abuse 12/15/2013  . DKA, type 1 ([place]) 12/15/2013  . Diabetic ketoacidosis ([place]) 10/05/2013  . Hyperkalemia 10/15/2012  . Leukocytosis 10/15/2012  . Noncompliance 10/15/2012  . Polysubstance dependence ([place]) 07/22/2011    Class: Acute  . Substance induced mood disorder ([place]) 07/22/2011    Class: Acute    Past Surgical History:  Procedure Laterality Date  . none      Allergies Sulfa antibiotics  Family  History  Problem Relation Age of Onset  . Diabetes Mellitus I Paternal Grandmother   . Diabetes Mellitus I Paternal Aunt   . Anxiety disorder Mother   . Hypertension Mother   . Hypertension Father     Social History Social History   Tobacco Use  . Smoking status: Current Every Day Smoker    Packs/day: 1.00    Years: 5.00    Pack years: 5.00    Types: Cigarettes  . Smokeless tobacco: Never Used  Substance Use Topics  . Alcohol use: No  . Drug use: Not Currently    Types: Marijuana, Other-see comments, Benzodiazepines, Hydrocodone, Oxycodone    Comment: occ    Review of Systems  Constitutional: No fever/chills. Positive fatigue.  Eyes: No visual changes. ENT: No sore throat. Cardiovascular: Denies chest pain. Respiratory: Positive shortness of breath. Gastrointestinal: Positive abdominal cramping pain. Positive nausea and vomiting.  No diarrhea.  No constipation. Genitourinary: Negative for dysuria. Musculoskeletal: Negative for back pain. Skin: Negative for rash. Neurological: Negative for headaches, focal weakness or numbness.  10-point ROS otherwise negative.  ____________________________________________   PHYSICAL EXAM:  VITAL SIGNS: ED Triage Vitals  Enc Vitals Group     BP 09/16/18 1303 (!) 111/99     Pulse Rate 09/16/18 1303 (!) 143     Resp 09/16/18 1303 20     Temp 09/16/18 1303 97.7 F (36.5 C)     Temp Source 09/16/18 1303 Oral     SpO2 09/16/18 1303 94 %  Weight 09/16/18 1301 140 lb (63.5 kg)     Height 09/16/18 1301 5\' 11"  (1.803 m)     Pain Score 09/16/18 1301 8   Constitutional: Alert and oriented. Well appearing and in no acute distress. Eyes: Conjunctivae are normal. PERRL. EOMI. Head: Atraumatic. Nose: No congestion/rhinnorhea. Mouth/Throat: Mucous membranes are dry.  Neck: No stridor.   Cardiovascular: Tachycardia. Good peripheral circulation. Grossly normal heart sounds.   Respiratory: Increased respiratory effort.  No  retractions. Lungs CTAB. Gastrointestinal: Soft and nontender. No distention.  Musculoskeletal: No lower extremity tenderness nor edema. No gross deformities of extremities. Neurologic:  Normal speech and language.  Skin:  Skin is warm, dry and intact. No rash noted.   ____________________________________________   LABS (all labs ordered are listed, but only abnormal results are displayed)  Labs Reviewed  COMPREHENSIVE METABOLIC PANEL - Abnormal; Notable for the following components:      Result Value   Sodium 133 (*)    Potassium 3.4 (*)    Chloride 91 (*)    Glucose, Bld 147 (*)    BUN 68 (*)    Creatinine, Ser 1.37 (*)    AST 14 (*)    All other components within normal limits  CBC WITH DIFFERENTIAL/PLATELET - Abnormal; Notable for the following components:   WBC 16.8 (*)    RBC 6.36 (*)    Hemoglobin 19.0 (*)    HCT 55.4 (*)    Neutro Abs 13.3 (*)    Monocytes Absolute 1.2 (*)    All other components within normal limits  BLOOD GAS, VENOUS - Abnormal; Notable for the following components:   pH, Ven 7.489 (*)    pCO2, Ven 37.7 (*)    pO2, Ven 56.2 (*)    Bicarbonate 28.9 (*)    Acid-Base Excess 4.9 (*)    All other components within normal limits  CBG MONITORING, ED - Abnormal; Notable for the following components:   Glucose-Capillary 158 (*)    All other components within normal limits  SARS CORONAVIRUS 2 (HOSPITAL ORDER, [place] [facility])  LIPASE, BLOOD   ____________________________________________  RADIOLOGY  Dg Chest Portable 1 View  Result Date: 09/16/2018 CLINICAL DATA:  Emesis for past few days. EXAM: PORTABLE CHEST 1 VIEW COMPARISON:  May 07, 2017 FINDINGS: The heart size and mediastinal contours are within normal limits. Both lungs are clear. The visualized skeletal structures are unremarkable. IMPRESSION: No active disease. Electronically Signed   By: Fidela Salisbury M.D.   On: 09/16/2018 14:22     ____________________________________________   PROCEDURES  Procedure(s) performed:   Procedures  None  ____________________________________________   INITIAL IMPRESSION / ASSESSMENT AND PLAN / ED COURSE  Pertinent labs & imaging results that were available during my care of the patient were reviewed by me and considered in my medical decision making (see chart for details).   Patient presents to the emergency department for evaluation of nausea and vomiting.  Symptoms have worsened over the past 3 days.  Recently changed back to Lantus from his 7030.  CBG here is slightly elevated but not severe.  Patient does have significant tachycardia.  No fevers or other infection symptoms at home.  Doubt sepsis.  Plan for screening labs including VBG and IV fluids.  COVID negative.  Patient with significant leukocytosis and slight elevated creatinine and BUN.  Suspect dehydration clinically likely from viral gastroenteritis type process.  Patient does have some diffuse abdominal discomfort.  In the setting of leukocytosis plan for  CT imaging of the abdomen and pelvis.  Patient's tachycardia improving with IV fluid.  No hypotension. Care transferred to Dr. Regenia Skeeter pending CT and PO challenge.  ____________________________________________  FINAL CLINICAL IMPRESSION(S) / ED DIAGNOSES  Final diagnoses:  Generalized abdominal pain  Non-intractable vomiting with nausea, unspecified vomiting type  Dehydration     MEDICATIONS GIVEN DURING THIS VISIT:  Medications  sodium chloride 0.9 % bolus 1,000 mL (1,000 mLs Intravenous New Bag/Given 09/16/18 1338)  ondansetron (ZOFRAN) injection 4 mg (4 mg Intravenous Given 09/16/18 1338)  sodium chloride 0.9 % bolus 1,000 mL (1,000 mLs Intravenous New Bag/Given 09/16/18 1434)    Note:  This document was prepared using Dragon voice recognition software and may include unintentional dictation errors.  Nanda Quinton, MD Emergency Medicine    Trig Mcbryar, [facility],  MD 09/16/18 [phone]

## 2020-03-05 DIAGNOSIS — E1143 Type 2 diabetes mellitus with diabetic autonomic (poly)neuropathy: Secondary | ICD-10-CM | POA: Diagnosis not present

## 2020-03-05 DIAGNOSIS — I10 Essential (primary) hypertension: Secondary | ICD-10-CM | POA: Diagnosis not present

## 2020-03-05 DIAGNOSIS — E0843 Diabetes mellitus due to underlying condition with diabetic autonomic (poly)neuropathy: Secondary | ICD-10-CM | POA: Diagnosis not present

## 2020-03-05 DIAGNOSIS — Z681 Body mass index (BMI) 19 or less, adult: Secondary | ICD-10-CM | POA: Diagnosis not present

## 2020-03-11 DIAGNOSIS — F1320 Sedative, hypnotic or anxiolytic dependence, uncomplicated: Secondary | ICD-10-CM | POA: Diagnosis not present

## 2020-03-11 DIAGNOSIS — F1420 Cocaine dependence, uncomplicated: Secondary | ICD-10-CM | POA: Diagnosis not present

## 2020-03-12 DIAGNOSIS — F1320 Sedative, hypnotic or anxiolytic dependence, uncomplicated: Secondary | ICD-10-CM | POA: Diagnosis not present

## 2020-03-12 DIAGNOSIS — F1420 Cocaine dependence, uncomplicated: Secondary | ICD-10-CM | POA: Diagnosis not present

## 2020-03-13 DIAGNOSIS — F1420 Cocaine dependence, uncomplicated: Secondary | ICD-10-CM | POA: Diagnosis not present

## 2020-03-13 DIAGNOSIS — F1320 Sedative, hypnotic or anxiolytic dependence, uncomplicated: Secondary | ICD-10-CM | POA: Diagnosis not present

## 2020-03-14 DIAGNOSIS — F1320 Sedative, hypnotic or anxiolytic dependence, uncomplicated: Secondary | ICD-10-CM | POA: Diagnosis not present

## 2020-03-14 DIAGNOSIS — F1420 Cocaine dependence, uncomplicated: Secondary | ICD-10-CM | POA: Diagnosis not present

## 2020-03-15 DIAGNOSIS — F1320 Sedative, hypnotic or anxiolytic dependence, uncomplicated: Secondary | ICD-10-CM | POA: Diagnosis not present

## 2020-03-15 DIAGNOSIS — F1420 Cocaine dependence, uncomplicated: Secondary | ICD-10-CM | POA: Diagnosis not present

## 2020-03-16 DIAGNOSIS — F1420 Cocaine dependence, uncomplicated: Secondary | ICD-10-CM | POA: Diagnosis not present

## 2020-03-16 DIAGNOSIS — F1320 Sedative, hypnotic or anxiolytic dependence, uncomplicated: Secondary | ICD-10-CM | POA: Diagnosis not present

## 2020-03-17 DIAGNOSIS — F1420 Cocaine dependence, uncomplicated: Secondary | ICD-10-CM | POA: Diagnosis not present

## 2020-03-17 DIAGNOSIS — F1320 Sedative, hypnotic or anxiolytic dependence, uncomplicated: Secondary | ICD-10-CM | POA: Diagnosis not present

## 2020-03-18 DIAGNOSIS — F1320 Sedative, hypnotic or anxiolytic dependence, uncomplicated: Secondary | ICD-10-CM | POA: Diagnosis not present

## 2020-03-18 DIAGNOSIS — F1420 Cocaine dependence, uncomplicated: Secondary | ICD-10-CM | POA: Diagnosis not present

## 2020-03-19 DIAGNOSIS — F1420 Cocaine dependence, uncomplicated: Secondary | ICD-10-CM | POA: Diagnosis not present

## 2020-03-19 DIAGNOSIS — F1320 Sedative, hypnotic or anxiolytic dependence, uncomplicated: Secondary | ICD-10-CM | POA: Diagnosis not present

## 2020-03-20 DIAGNOSIS — F1320 Sedative, hypnotic or anxiolytic dependence, uncomplicated: Secondary | ICD-10-CM | POA: Diagnosis not present

## 2020-03-20 DIAGNOSIS — F1420 Cocaine dependence, uncomplicated: Secondary | ICD-10-CM | POA: Diagnosis not present

## 2020-03-21 DIAGNOSIS — F1320 Sedative, hypnotic or anxiolytic dependence, uncomplicated: Secondary | ICD-10-CM | POA: Diagnosis not present

## 2020-03-21 DIAGNOSIS — F1420 Cocaine dependence, uncomplicated: Secondary | ICD-10-CM | POA: Diagnosis not present

## 2020-03-22 DIAGNOSIS — F1320 Sedative, hypnotic or anxiolytic dependence, uncomplicated: Secondary | ICD-10-CM | POA: Diagnosis not present

## 2020-03-22 DIAGNOSIS — F1420 Cocaine dependence, uncomplicated: Secondary | ICD-10-CM | POA: Diagnosis not present

## 2020-03-23 DIAGNOSIS — F1320 Sedative, hypnotic or anxiolytic dependence, uncomplicated: Secondary | ICD-10-CM | POA: Diagnosis not present

## 2020-03-23 DIAGNOSIS — F1420 Cocaine dependence, uncomplicated: Secondary | ICD-10-CM | POA: Diagnosis not present

## 2020-03-24 DIAGNOSIS — F1420 Cocaine dependence, uncomplicated: Secondary | ICD-10-CM | POA: Diagnosis not present

## 2020-03-24 DIAGNOSIS — F1320 Sedative, hypnotic or anxiolytic dependence, uncomplicated: Secondary | ICD-10-CM | POA: Diagnosis not present

## 2020-03-26 DIAGNOSIS — F1420 Cocaine dependence, uncomplicated: Secondary | ICD-10-CM | POA: Diagnosis not present

## 2020-03-26 DIAGNOSIS — F1320 Sedative, hypnotic or anxiolytic dependence, uncomplicated: Secondary | ICD-10-CM | POA: Diagnosis not present

## 2020-03-30 DIAGNOSIS — F1420 Cocaine dependence, uncomplicated: Secondary | ICD-10-CM | POA: Diagnosis not present

## 2020-03-30 DIAGNOSIS — F1320 Sedative, hypnotic or anxiolytic dependence, uncomplicated: Secondary | ICD-10-CM | POA: Diagnosis not present

## 2020-04-02 DIAGNOSIS — F322 Major depressive disorder, single episode, severe without psychotic features: Secondary | ICD-10-CM | POA: Diagnosis not present

## 2020-04-02 DIAGNOSIS — F411 Generalized anxiety disorder: Secondary | ICD-10-CM | POA: Diagnosis not present

## 2020-04-02 DIAGNOSIS — F1320 Sedative, hypnotic or anxiolytic dependence, uncomplicated: Secondary | ICD-10-CM | POA: Diagnosis not present

## 2020-04-02 DIAGNOSIS — F1420 Cocaine dependence, uncomplicated: Secondary | ICD-10-CM | POA: Diagnosis not present

## 2020-04-06 DIAGNOSIS — F411 Generalized anxiety disorder: Secondary | ICD-10-CM | POA: Diagnosis not present

## 2020-04-06 DIAGNOSIS — F1420 Cocaine dependence, uncomplicated: Secondary | ICD-10-CM | POA: Diagnosis not present

## 2020-04-06 DIAGNOSIS — F322 Major depressive disorder, single episode, severe without psychotic features: Secondary | ICD-10-CM | POA: Diagnosis not present

## 2020-04-06 DIAGNOSIS — F1320 Sedative, hypnotic or anxiolytic dependence, uncomplicated: Secondary | ICD-10-CM | POA: Diagnosis not present

## 2020-04-09 DIAGNOSIS — F411 Generalized anxiety disorder: Secondary | ICD-10-CM | POA: Diagnosis not present

## 2020-04-09 DIAGNOSIS — F1320 Sedative, hypnotic or anxiolytic dependence, uncomplicated: Secondary | ICD-10-CM | POA: Diagnosis not present

## 2020-04-09 DIAGNOSIS — F322 Major depressive disorder, single episode, severe without psychotic features: Secondary | ICD-10-CM | POA: Diagnosis not present

## 2020-04-09 DIAGNOSIS — F1420 Cocaine dependence, uncomplicated: Secondary | ICD-10-CM | POA: Diagnosis not present

## 2020-08-10 DIAGNOSIS — I10 Essential (primary) hypertension: Secondary | ICD-10-CM | POA: Diagnosis not present

## 2020-08-10 DIAGNOSIS — Z681 Body mass index (BMI) 19 or less, adult: Secondary | ICD-10-CM | POA: Diagnosis not present

## 2020-08-10 DIAGNOSIS — E1143 Type 2 diabetes mellitus with diabetic autonomic (poly)neuropathy: Secondary | ICD-10-CM | POA: Diagnosis not present

## 2020-09-03 DIAGNOSIS — T43205S Adverse effect of unspecified antidepressants, sequela: Secondary | ICD-10-CM | POA: Diagnosis not present

## 2020-09-03 DIAGNOSIS — Z681 Body mass index (BMI) 19 or less, adult: Secondary | ICD-10-CM | POA: Diagnosis not present

## 2020-11-30 DIAGNOSIS — Z6820 Body mass index (BMI) 20.0-20.9, adult: Secondary | ICD-10-CM | POA: Diagnosis not present

## 2020-11-30 DIAGNOSIS — L0291 Cutaneous abscess, unspecified: Secondary | ICD-10-CM | POA: Diagnosis not present

## 2020-12-05 ENCOUNTER — Emergency Department (HOSPITAL_COMMUNITY): Payer: BC Managed Care – PPO

## 2020-12-05 ENCOUNTER — Encounter (HOSPITAL_COMMUNITY): Payer: Self-pay | Admitting: Emergency Medicine

## 2020-12-05 ENCOUNTER — Encounter: Payer: Self-pay | Admitting: Emergency Medicine

## 2020-12-05 ENCOUNTER — Inpatient Hospital Stay (HOSPITAL_BASED_OUTPATIENT_CLINIC_OR_DEPARTMENT_OTHER): Payer: BC Managed Care – PPO

## 2020-12-05 ENCOUNTER — Other Ambulatory Visit: Payer: Self-pay

## 2020-12-05 ENCOUNTER — Inpatient Hospital Stay (HOSPITAL_COMMUNITY): Payer: BC Managed Care – PPO

## 2020-12-05 ENCOUNTER — Inpatient Hospital Stay (HOSPITAL_COMMUNITY)
Admission: EM | Admit: 2020-12-05 | Discharge: 2020-12-22 | DRG: 163 | Disposition: A | Discharge status: Home / Self Care | Payer: BC Managed Care – PPO | Attending: Internal Medicine | Admitting: Internal Medicine

## 2020-12-05 ENCOUNTER — Ambulatory Visit
Admission: EM | Admit: 2020-12-05 | Discharge: 2020-12-05 | Payer: BC Managed Care – PPO | Attending: Family Medicine | Admitting: Family Medicine

## 2020-12-05 DIAGNOSIS — E1065 Type 1 diabetes mellitus with hyperglycemia: Secondary | ICD-10-CM

## 2020-12-05 DIAGNOSIS — J181 Lobar pneumonia, unspecified organism: Secondary | ICD-10-CM | POA: Diagnosis not present

## 2020-12-05 DIAGNOSIS — J9311 Primary spontaneous pneumothorax: Secondary | ICD-10-CM | POA: Diagnosis not present

## 2020-12-05 DIAGNOSIS — R0600 Dyspnea, unspecified: Secondary | ICD-10-CM

## 2020-12-05 DIAGNOSIS — Z9889 Other specified postprocedural states: Secondary | ICD-10-CM

## 2020-12-05 DIAGNOSIS — Z4682 Encounter for fitting and adjustment of non-vascular catheter: Secondary | ICD-10-CM | POA: Diagnosis not present

## 2020-12-05 DIAGNOSIS — E1040 Type 1 diabetes mellitus with diabetic neuropathy, unspecified: Secondary | ICD-10-CM | POA: Diagnosis present

## 2020-12-05 DIAGNOSIS — I10 Essential (primary) hypertension: Secondary | ICD-10-CM | POA: Diagnosis not present

## 2020-12-05 DIAGNOSIS — J939 Pneumothorax, unspecified: Secondary | ICD-10-CM

## 2020-12-05 DIAGNOSIS — Z882 Allergy status to sulfonamides status: Secondary | ICD-10-CM

## 2020-12-05 DIAGNOSIS — Z7951 Long term (current) use of inhaled steroids: Secondary | ICD-10-CM | POA: Diagnosis not present

## 2020-12-05 DIAGNOSIS — E860 Dehydration: Secondary | ICD-10-CM | POA: Diagnosis present

## 2020-12-05 DIAGNOSIS — R000 Tachycardia, unspecified: Secondary | ICD-10-CM

## 2020-12-05 DIAGNOSIS — N179 Acute kidney failure, unspecified: Secondary | ICD-10-CM | POA: Diagnosis present

## 2020-12-05 DIAGNOSIS — R739 Hyperglycemia, unspecified: Secondary | ICD-10-CM | POA: Diagnosis not present

## 2020-12-05 DIAGNOSIS — Z79899 Other long term (current) drug therapy: Secondary | ICD-10-CM | POA: Diagnosis not present

## 2020-12-05 DIAGNOSIS — J9601 Acute respiratory failure with hypoxia: Secondary | ICD-10-CM | POA: Diagnosis present

## 2020-12-05 DIAGNOSIS — Z9114 Patient's other noncompliance with medication regimen: Secondary | ICD-10-CM | POA: Diagnosis not present

## 2020-12-05 DIAGNOSIS — J9383 Other pneumothorax: Secondary | ICD-10-CM | POA: Diagnosis not present

## 2020-12-05 DIAGNOSIS — Z09 Encounter for follow-up examination after completed treatment for conditions other than malignant neoplasm: Secondary | ICD-10-CM

## 2020-12-05 DIAGNOSIS — J9382 Other air leak: Secondary | ICD-10-CM | POA: Diagnosis not present

## 2020-12-05 DIAGNOSIS — K5900 Constipation, unspecified: Secondary | ICD-10-CM | POA: Diagnosis present

## 2020-12-05 DIAGNOSIS — Z818 Family history of other mental and behavioral disorders: Secondary | ICD-10-CM

## 2020-12-05 DIAGNOSIS — Z833 Family history of diabetes mellitus: Secondary | ICD-10-CM | POA: Diagnosis not present

## 2020-12-05 DIAGNOSIS — Z8249 Family history of ischemic heart disease and other diseases of the circulatory system: Secondary | ICD-10-CM

## 2020-12-05 DIAGNOSIS — R0602 Shortness of breath: Secondary | ICD-10-CM

## 2020-12-05 DIAGNOSIS — F17210 Nicotine dependence, cigarettes, uncomplicated: Secondary | ICD-10-CM | POA: Diagnosis not present

## 2020-12-05 DIAGNOSIS — F1910 Other psychoactive substance abuse, uncomplicated: Secondary | ICD-10-CM | POA: Diagnosis not present

## 2020-12-05 DIAGNOSIS — E785 Hyperlipidemia, unspecified: Secondary | ICD-10-CM | POA: Diagnosis not present

## 2020-12-05 DIAGNOSIS — E7800 Pure hypercholesterolemia, unspecified: Secondary | ICD-10-CM | POA: Diagnosis present

## 2020-12-05 DIAGNOSIS — J439 Emphysema, unspecified: Secondary | ICD-10-CM | POA: Diagnosis present

## 2020-12-05 DIAGNOSIS — E109 Type 1 diabetes mellitus without complications: Secondary | ICD-10-CM | POA: Diagnosis present

## 2020-12-05 DIAGNOSIS — R918 Other nonspecific abnormal finding of lung field: Secondary | ICD-10-CM | POA: Diagnosis not present

## 2020-12-05 DIAGNOSIS — F909 Attention-deficit hyperactivity disorder, unspecified type: Secondary | ICD-10-CM | POA: Diagnosis not present

## 2020-12-05 DIAGNOSIS — J90 Pleural effusion, not elsewhere classified: Secondary | ICD-10-CM | POA: Diagnosis not present

## 2020-12-05 DIAGNOSIS — E1010 Type 1 diabetes mellitus with ketoacidosis without coma: Secondary | ICD-10-CM | POA: Diagnosis not present

## 2020-12-05 DIAGNOSIS — J941 Fibrothorax: Secondary | ICD-10-CM | POA: Diagnosis not present

## 2020-12-05 DIAGNOSIS — Z794 Long term (current) use of insulin: Secondary | ICD-10-CM

## 2020-12-05 DIAGNOSIS — R079 Chest pain, unspecified: Secondary | ICD-10-CM

## 2020-12-05 DIAGNOSIS — R091 Pleurisy: Secondary | ICD-10-CM | POA: Diagnosis not present

## 2020-12-05 DIAGNOSIS — Z20822 Contact with and (suspected) exposure to covid-19: Secondary | ICD-10-CM | POA: Diagnosis present

## 2020-12-05 DIAGNOSIS — Z9689 Presence of other specified functional implants: Secondary | ICD-10-CM | POA: Diagnosis not present

## 2020-12-05 DIAGNOSIS — J9811 Atelectasis: Secondary | ICD-10-CM | POA: Diagnosis not present

## 2020-12-05 DIAGNOSIS — Z902 Acquired absence of lung [part of]: Secondary | ICD-10-CM

## 2020-12-05 DIAGNOSIS — R0682 Tachypnea, not elsewhere classified: Secondary | ICD-10-CM | POA: Diagnosis not present

## 2020-12-05 HISTORY — DX: Essential (primary) hypertension: I10

## 2020-12-05 HISTORY — DX: Pure hypercholesterolemia, unspecified: E78.00

## 2020-12-05 LAB — RAPID URINE DRUG SCREEN, HOSP PERFORMED
Amphetamines: POSITIVE — AB
Barbiturates: NOT DETECTED
Benzodiazepines: POSITIVE — AB
Cocaine: NOT DETECTED
Opiates: POSITIVE — AB
Tetrahydrocannabinol: POSITIVE — AB

## 2020-12-05 LAB — CBC
HCT: 49.1 % (ref 39.0–52.0)
HCT: 54.1 % — ABNORMAL HIGH (ref 39.0–52.0)
Hemoglobin: 15.6 g/dL (ref 13.0–17.0)
Hemoglobin: 17.9 g/dL — ABNORMAL HIGH (ref 13.0–17.0)
MCH: 30.3 pg (ref 26.0–34.0)
MCH: 30.5 pg (ref 26.0–34.0)
MCHC: 31.8 g/dL (ref 30.0–36.0)
MCHC: 33.1 g/dL (ref 30.0–36.0)
MCV: 92.2 fL (ref 80.0–100.0)
MCV: 95.3 fL (ref 80.0–100.0)
Platelets: 369 10*3/uL (ref 150–400)
Platelets: 372 10*3/uL (ref 150–400)
RBC: 5.15 MIL/uL (ref 4.22–5.81)
RBC: 5.87 MIL/uL — ABNORMAL HIGH (ref 4.22–5.81)
RDW: 12.8 % (ref 11.5–15.5)
RDW: 12.9 % (ref 11.5–15.5)
WBC: 12.4 10*3/uL — ABNORMAL HIGH (ref 4.0–10.5)
WBC: 33.7 10*3/uL — ABNORMAL HIGH (ref 4.0–10.5)
nRBC: 0 % (ref 0.0–0.2)
nRBC: 0 % (ref 0.0–0.2)

## 2020-12-05 LAB — BASIC METABOLIC PANEL
Anion gap: 11 (ref 5–15)
BUN: 27 mg/dL — ABNORMAL HIGH (ref 6–20)
CO2: 24 mmol/L (ref 22–32)
Calcium: 9 mg/dL (ref 8.9–10.3)
Chloride: 98 mmol/L (ref 98–111)
Creatinine, Ser: 1.3 mg/dL — ABNORMAL HIGH (ref 0.61–1.24)
GFR, Estimated: >60 mL/min (ref >60)
Glucose, Bld: 361 mg/dL — ABNORMAL HIGH (ref 70–99)
Potassium: 4.2 mmol/L (ref 3.5–5.1)
Sodium: 133 mmol/L — ABNORMAL LOW (ref 135–145)

## 2020-12-05 LAB — TROPONIN I (HIGH SENSITIVITY): Troponin I (High Sensitivity): 4 ng/L (ref <18)

## 2020-12-05 LAB — CBG MONITORING, ED: Glucose-Capillary: >600 mg/dL (ref 70–99)

## 2020-12-05 LAB — GLUCOSE, CAPILLARY: Glucose-Capillary: 548 mg/dL (ref 70–99)

## 2020-12-05 LAB — POCT FASTING CBG KUC MANUAL ENTRY: POCT Glucose (KUC): 227 mg/dL — AB (ref 70–99)

## 2020-12-05 MED ORDER — FUROSEMIDE 10 MG/ML IJ SOLN
20.0000 mg | Freq: Once | INTRAMUSCULAR | Status: AC
  Administered 2020-12-05: 20 mg via INTRAVENOUS
  Filled 2020-12-05: qty 2

## 2020-12-05 MED ORDER — SODIUM CHLORIDE 0.9 % IV SOLN
1.0000 g | Freq: Once | INTRAVENOUS | Status: AC
  Administered 2020-12-05: 1 g via INTRAVENOUS
  Filled 2020-12-05: qty 10

## 2020-12-05 MED ORDER — MORPHINE SULFATE (PF) 2 MG/ML IV SOLN
1.0000 mg | Freq: Once | INTRAVENOUS | Status: AC
  Administered 2020-12-05: 1 mg via INTRAVENOUS
  Filled 2020-12-05: qty 1

## 2020-12-05 MED ORDER — SODIUM CHLORIDE 0.9 % IV SOLN
1.0000 g | INTRAVENOUS | Status: DC
  Administered 2020-12-06 – 2020-12-14 (×8): 1 g via INTRAVENOUS
  Filled 2020-12-05 (×10): qty 10

## 2020-12-05 MED ORDER — MORPHINE SULFATE (PF) 2 MG/ML IV SOLN
1.0000 mg | Freq: Once | INTRAVENOUS | Status: AC
Start: 2020-12-05 — End: 2020-12-05
  Administered 2020-12-05: 1 mg via INTRAVENOUS
  Filled 2020-12-05: qty 1

## 2020-12-05 MED ORDER — IOHEXOL 350 MG/ML SOLN
75.0000 mL | Freq: Once | INTRAVENOUS | Status: AC | PRN
  Administered 2020-12-05: 75 mL via INTRAVENOUS

## 2020-12-05 MED ORDER — LIDOCAINE-EPINEPHRINE (PF) 2 %-1:200000 IJ SOLN
20.0000 mL | Freq: Once | INTRAMUSCULAR | Status: AC
  Administered 2020-12-05: 20 mL via INTRADERMAL
  Filled 2020-12-05: qty 20

## 2020-12-05 MED ORDER — SODIUM CHLORIDE 0.9 % IV SOLN
500.0000 mg | Freq: Once | INTRAVENOUS | Status: AC
  Administered 2020-12-05: 500 mg via INTRAVENOUS
  Filled 2020-12-05: qty 500

## 2020-12-05 MED ORDER — MIDAZOLAM HCL 2 MG/2ML IJ SOLN
2.0000 mg | Freq: Once | INTRAMUSCULAR | Status: AC
  Administered 2020-12-05: 2 mg via INTRAVENOUS
  Filled 2020-12-05: qty 2

## 2020-12-05 MED ORDER — SODIUM CHLORIDE 0.9 % IV SOLN
500.0000 mg | INTRAVENOUS | Status: DC
  Administered 2020-12-06: 500 mg via INTRAVENOUS
  Filled 2020-12-05 (×2): qty 500

## 2020-12-05 MED ORDER — ONDANSETRON HCL 4 MG/2ML IJ SOLN
4.0000 mg | Freq: Four times a day (QID) | INTRAMUSCULAR | Status: DC | PRN
  Administered 2020-12-08: 4 mg via INTRAVENOUS
  Filled 2020-12-05: qty 2

## 2020-12-05 MED ORDER — ACETAMINOPHEN 325 MG PO TABS
650.0000 mg | ORAL_TABLET | Freq: Four times a day (QID) | ORAL | Status: DC | PRN
  Administered 2020-12-09 (×2): 650 mg via ORAL
  Filled 2020-12-05 (×3): qty 2

## 2020-12-05 MED ORDER — FENTANYL CITRATE PF 50 MCG/ML IJ SOSY
100.0000 ug | PREFILLED_SYRINGE | Freq: Once | INTRAMUSCULAR | Status: AC
  Administered 2020-12-05: 100 ug via INTRAVENOUS
  Filled 2020-12-05: qty 2

## 2020-12-05 MED ORDER — INSULIN ASPART 100 UNIT/ML IJ SOLN
10.0000 [IU] | Freq: Once | INTRAMUSCULAR | Status: AC
  Administered 2020-12-05: 10 [IU] via SUBCUTANEOUS
  Filled 2020-12-05: qty 1

## 2020-12-05 MED ORDER — ACETAMINOPHEN 650 MG RE SUPP: 650.0000 mg | Freq: Four times a day (QID) | RECTAL | Status: DC | PRN

## 2020-12-05 MED ORDER — INSULIN ASPART 100 UNIT/ML IJ SOLN
0.0000 [IU] | INTRAMUSCULAR | Status: DC
  Administered 2020-12-05: 15 [IU] via SUBCUTANEOUS
  Administered 2020-12-06 (×2): 8 [IU] via SUBCUTANEOUS
  Administered 2020-12-07 (×3): 3 [IU] via SUBCUTANEOUS
  Administered 2020-12-08: 2 [IU] via SUBCUTANEOUS

## 2020-12-05 MED ORDER — RISPERIDONE 3 MG PO TABS
3.0000 mg | ORAL_TABLET | Freq: Every day | ORAL | Status: DC
  Administered 2020-12-06 – 2020-12-22 (×17): 3 mg via ORAL
  Filled 2020-12-05 (×17): qty 1

## 2020-12-05 MED ORDER — ONDANSETRON HCL 4 MG PO TABS: 4.0000 mg | ORAL_TABLET | Freq: Four times a day (QID) | ORAL | Status: DC | PRN

## 2020-12-05 NOTE — ED Notes (Signed)
Pt's 02 sat = 85% on 2LNC, 02 increased to [facility], MD notified. EKG obtained. MD to bedside.

## 2020-12-05 NOTE — ED Triage Notes (Signed)
Patient c/o left chest pain that radiates into upper back between shoulder blades x1 week. Per patient shortness of breath, nausea, and lightheadedness. Patient seen at Urgent Care and sent here to ED for Sinus tachycardia and hyperglycemia.

## 2020-12-05 NOTE — ED Notes (Signed)
Pt to cT w/RN.

## 2020-12-05 NOTE — ED Notes (Signed)
Patient is being discharged from the Urgent Care and sent to the Emergency Department via private vehcile with wife . Per NP, patient is in need of higher level of care due to chest pain, tachycardia and SOB. Patient is aware and verbalizes understanding of plan of care.  Vitals:   12/05/20 1339  BP: 113/79  Pulse: (!) 126  Resp: (!) 30  Temp: 97.6 F (36.4 C)  SpO2: 95%

## 2020-12-05 NOTE — ED Provider Notes (Signed)
[facility]    CSN: [id number] Arrival date & time: 12/05/20  1320      History   Chief Complaint No chief complaint on file.   HPI Pedro [name] is a 25 y.o. male.   HPI Patient with a history of type 2 diabetes, history of polysubstance abuse, presents today with shortness of breath, chest pain, tachypnea.  Patient reports over the last week he has not felt well.  Blood sugar on arrival was 227.  Patient asked explicitly if he is taking any illicit drugs including pillbox or any other substances and he denied.  Patient is also drowsy however able to respond to questions.  Past Medical History:  Diagnosis Date   ADHD (attention deficit hyperactivity disorder)    DM type 1 (diabetes mellitus, type 1) ([place])    diagnosed age 60   Herpes    Noncompliance with medications     Patient Active Problem List   Diagnosis Date Noted   DKA (diabetic ketoacidosis) ([place]) 11/09/2016   PNA (pneumonia) 07/04/2016   DM type 1 (diabetes mellitus, type 1) ([place]) 07/01/2016   Noncompliance with medications 07/01/2016   Polysubstance abuse ([place])    Tachycardia 10/17/2014   Acute renal failure ([place]) 10/17/2014   DKA (diabetic ketoacidoses) 03/19/2014   Diabetic neuropathy ([place]) 03/19/2014   Dehydration    Gastroenteritis 02/16/2014   Diabetic ketoacidosis without coma associated with type 1 diabetes mellitus ([place])    Genital herpes simplex type 2 12/15/2013   Tobacco abuse 12/15/2013   DKA, type 1 ([place]) 12/15/2013   Diabetic ketoacidosis ([place]) 10/05/2013   Hyperkalemia 10/15/2012   Leukocytosis 10/15/2012   Noncompliance 10/15/2012   Polysubstance dependence ([place]) 07/22/2011    Class: Acute   Substance induced mood disorder ([place]) 07/22/2011    Class: Acute    Past Surgical History:  Procedure Laterality Date   none         Home Medications    Prior to Admission medications   Medication Sig Start Date End Date Taking? Authorizing Provider   amoxicillin-clavulanate (AUGMENTIN) 875-125 MG tablet Take 1 tablet by mouth 2 (two) times daily. Take all of this medication Patient not taking: Reported on 09/16/2018 08/28/18   Claretta Fraise, MD  Insulin Glargine (LANTUS SOLOSTAR) 100 UNIT/ML Solostar Pen Inject 50 Units into the skin every morning. Patient taking differently: Inject 50 Units into the skin at bedtime.  08/03/18 08/03/19  Claretta Fraise, MD  ondansetron (ZOFRAN ODT) 4 MG disintegrating tablet Take 1 tablet (4 mg total) by mouth every 8 (eight) hours as needed for nausea or vomiting. 09/16/18   Sherwood Gambler, MD    Family History Family History  Problem Relation Age of Onset   Diabetes Mellitus I Paternal Grandmother    Diabetes Mellitus I Paternal Aunt    Anxiety disorder Mother    Hypertension Mother    Hypertension Father     Social History Social History   Tobacco Use   Smoking status: Every Day    Packs/day: 1.00    Years: 5.00    Pack years: 5.00    Types: Cigarettes   Smokeless tobacco: Never  Vaping Use   Vaping Use: Never used  Substance Use Topics   Alcohol use: No   Drug use: Not Currently    Types: Marijuana, Other-see comments, Benzodiazepines, Hydrocodone, Oxycodone    Comment: occ     Allergies   Sulfa antibiotics   Review of Systems Review of Systems Pertinent negatives listed in HPI  Physical Exam Triage Vital Signs ED Triage Vitals  Enc Vitals Group     BP 12/05/20 1339 113/79     Pulse Rate 12/05/20 1339 (!) 126     Resp 12/05/20 1339 (!) 30     Temp 12/05/20 1339 97.6 F (36.4 C)     Temp Source 12/05/20 1339 Oral     SpO2 12/05/20 1339 95 %     Weight --      Height --      Head Circumference --      Peak Flow --      Pain Score 12/05/20 1341 5     Pain Loc --      Pain Edu? --      Excl. in [place]? --    No data found.  Updated Vital Signs BP 113/79 (BP Location: Right Arm)   Pulse (!) 126   Temp 97.6 F (36.4 C) (Oral)   Resp (!) 30   SpO2 95%   Visual  Acuity Right Eye Distance:   Left Eye Distance:   Bilateral Distance:    Right Eye Near:   Left Eye Near:    Bilateral Near:     Physical Exam Constitutional:      Appearance: He is underweight. He is ill-appearing.     Comments: Patient is arousable with questions although appears overly sleepy and sedated  HENT:     Head: Normocephalic.  Eyes:     Pupils: Pupils are equal, round, and reactive to light.  Cardiovascular:     Rate and Rhythm: Regular rhythm. Tachycardia present.  Pulmonary:     Effort: Tachypnea present. No respiratory distress.     Breath sounds: Normal air entry. No wheezing.  Skin:    Capillary Refill: Capillary refill takes less than 2 seconds.  Neurological:     Mental Status: He is easily aroused. He is lethargic.  Psychiatric:        Mood and Affect: Mood normal.        Behavior: Behavior normal.        Thought Content: Thought content normal.        Judgment: Judgment normal.     UC Treatments / Results  Labs (all labs ordered are listed, but only abnormal results are displayed) Labs Reviewed  POCT FASTING CBG [facility] MANUAL ENTRY - Abnormal; Notable for the following components:      Result Value   POCT Glucose ([facility]) 227 (*)    All other components within normal limits    EKG   Radiology No results found.  Procedures Procedures (including critical care time)  Medications Ordered in UC Medications - No data to display  Initial Impression / Assessment and Plan / UC Course  I have reviewed the triage vital signs and the nursing notes.  Pertinent labs & imaging results that were available during my care of the patient were reviewed by me and considered in my medical decision making (see chart for details).     Sinus Tachycardia and SOB,. Vitals stable with the exception of tachypnea and tachycardia unable to rule out completely the patient is under influence of a substance and or suffering from an acute illness.  Given limitations of  urgent care patient has been directed to the directed to go immediately to the emergency department.  Offered EMS however patient is accompanied by family who will transport [facility] to [facility] Emergency ER.  Final Clinical Impressions(s) / UC Diagnoses   Final diagnoses:  Sinus  tachycardia by electrocardiogram  Hyperglycemia  Type 1 diabetes mellitus with hyperglycemia (HCC)  Dyspnea, unspecified type   Discharge Instructions   None    ED Prescriptions   None    PDMP not reviewed this encounter.   Scot Jun, FNP 12/05/20 [phone]

## 2020-12-05 NOTE — ED Triage Notes (Signed)
Pt presents today with c/o SOB, cough and chest pain with coughing x 1.  Denies fever. Home Covid test neg 1 wk ago. Requests Covid test today.

## 2020-12-05 NOTE — ED Notes (Addendum)
Patient refused Arterial blood gas , states that shit hurts He will allow venus blood gas. Md notified.

## 2020-12-05 NOTE — ED Notes (Signed)
RT at bedside.

## 2020-12-05 NOTE — ED Notes (Signed)
Pt 02 sat = 85% on 4LNC, MD notified, pt placed on NRB 15L.

## 2020-12-05 NOTE — ED Notes (Signed)
Pt to [facility] [id number] via carelink. Pt A&Ox4, labored breathing, tachypnic, states he is feeling better. Pt able to stand and transfer to [place].

## 2020-12-05 NOTE — H&P (Signed)
History and Physical    Pedro [name] [id number]:[id number] DOB: 1995/04/20 DOA: 12/05/2020  PCP: Neale Burly, MD   Patient coming from: Home  I have personally briefly reviewed patient's old medical records in [place]  Chief Complaint: Chest pain, difficulty breathing  HPI: Pedro [name] is a 25 y.o. male with medical history significant for type 1 diabetes mellitus, polysubstance abuse.  Initially presented to the urgent care, but he was referred to the ED as he was tachycardic and tachypneic. Patient reports he has had ongoing left-sided chest pain radiating into his back.  He reported associated difficulty breathing, nausea and lightheadedness.  Denies any drug use.  ED Course: Temperature 97.8.  Heart rates 120s to 140s.  Respiratory rate 20- 40.  Blood pressure systolic 867 -544B.  O2 sats initially 98% on room air, dropped rapidly to the 80s, placed on nasal cannula, O2 sats on 4 L in the mid 80s.  Switched to nonrebreather. X-ray showed large right pneumothorax with evidence of tension physiology. Chest tube was placed in the ED. Versed and fentanyl was given.  Repeat chest x-ray showed reexpansion of the right lung, but still persistent moderate right pneumothorax, and patchy opacities throughout the rear ended right lung which may be respiration edema or hemorrhage. EDP talked to Dr. Arnoldo Morale, okay to admit here, will assist with chest tube management.  With respiratory decompensation, repeat portable chest x-ray was obtained showed marked severity airspace disease within the mid right lung and right lung base.  Review of Systems: As per HPI all other systems reviewed and negative.  Past Medical History:  Diagnosis Date   ADHD (attention deficit hyperactivity disorder)    DM type 1 (diabetes mellitus, type 1) ([place])    diagnosed age 5   Herpes    High cholesterol    Hypertension    Noncompliance with medications     Past Surgical History:  Procedure  Laterality Date   none       reports that he has been smoking cigarettes. He has a 5.00 pack-year smoking history. He has never used smokeless tobacco. He reports that he does not currently use drugs after having used the following drugs: Marijuana, Other-see comments, and Benzodiazepines. He reports that he does not drink alcohol.  Allergies  Allergen Reactions   Sulfa Antibiotics Hives and Other (See Comments)    Reaction:  Fever     Family History  Problem Relation Age of Onset   Diabetes Mellitus I Paternal Grandmother    Diabetes Mellitus I Paternal Aunt    Anxiety disorder Mother    Hypertension Mother    Hypertension Father     Prior to Admission medications   Medication Sig Start Date End Date Taking? Authorizing Provider  clindamycin (CLEOCIN) 300 MG capsule Take 300 mg by mouth 3 (three) times daily. 11/30/20  Yes [provider]  gabapentin (NEURONTIN) 300 MG capsule Take 300 mg by mouth 3 (three) times daily. 10/09/20  Yes [provider]  insulin NPH-regular Human (70-30) 100 UNIT/ML injection Inject 40 Units into the skin 2 (two) times daily.   Yes [provider]  lisinopril (ZESTRIL) 10 MG tablet Take 10 mg by mouth daily. 08/10/20  Yes [provider]  risperiDONE (RISPERDAL) 3 MG tablet Take 3 mg by mouth daily. 12/02/20  Yes [provider]  SYMBICORT 160-4.5 MCG/ACT inhaler Inhale 2 puffs into the lungs 2 (two) times daily. 11/30/20  Yes [provider]  amoxicillin-clavulanate (AUGMENTIN) 875-125  MG tablet Take 1 tablet by mouth 2 (two) times daily. Take all of this medication Patient not taking: No sig reported 08/28/18   Claretta Fraise, MD  Insulin Glargine (LANTUS SOLOSTAR) 100 UNIT/ML Solostar Pen Inject 50 Units into the skin every morning. Patient not taking: Reported on 12/05/2020 08/03/18 08/03/19  Claretta Fraise, MD  ondansetron (ZOFRAN ODT) 4 MG disintegrating tablet Take 1 tablet (4 mg total) by mouth every 8  (eight) hours as needed for nausea or vomiting. Patient not taking: No sig reported 09/16/18   Sherwood Gambler, MD    Physical Exam: Vitals:   12/05/20 2030 12/05/20 2100 12/05/20 2115 12/05/20 2130  BP: 105/83 (!) 116/99  114/89  Pulse: (!) 138 (!) 133 (!) 136 (!) 133  Resp: (!) 24 (!) 26 (!) 37 (!) 37  Temp:      TempSrc:      SpO2: 92% 98% 94% 99%  Weight:      Height:        Constitutional: Acutely ill-appearing, appears slightly drowsy, discomfort from chest tube Vitals:   12/05/20 2030 12/05/20 2100 12/05/20 2115 12/05/20 2130  BP: 105/83 (!) 116/99  114/89  Pulse: (!) 138 (!) 133 (!) 136 (!) 133  Resp: (!) 24 (!) 26 (!) 37 (!) 37  Temp:      TempSrc:      SpO2: 92% 98% 94% 99%  Weight:      Height:       Eyes: PERRL, lids and conjunctivae normal ENMT: Mucous membranes are dry Neck: normal, supple, no masses, no thyromegaly Respiratory: Anterior auscultation, equal breath sounds bilaterally, mild rhonchi diffusely, mild increased work of breathing, tachypneic. Cardiovascular: Tachycardic, regular rate and rhythm, no murmurs / rubs / gallops. No extremity edema. 2+ pedal pulses.   Abdomen: no tenderness, no masses palpated. No hepatosplenomegaly. Bowel sounds positive.  Musculoskeletal: no clubbing / cyanosis. No joint deformity upper and lower extremities. Good ROM, no contractures. Normal muscle tone.  Skin: no rashes, lesions, ulcers. No induration Neurologic: No apparent cranial nerve abnormality, moving extremities spontaneously. Psychiatric: Normal judgment and insight. Alert and oriented x 3. Normal mood.   Labs on Admission: I have personally reviewed following labs and imaging studies  CBC: Recent Labs  Lab 12/05/20 1511  WBC 12.4*  HGB 15.6  HCT 49.1  MCV 95.3  PLT 626   Basic Metabolic Panel: Recent Labs  Lab 12/05/20 1511  NA 133*  K 4.2  CL 98  CO2 24  GLUCOSE 361*  BUN 27*  CREATININE 1.30*  CALCIUM 9.0   CBG: Recent Labs  Lab  12/05/20 2131  GLUCAP >600*   Radiological Exams on Admission: DG Chest 1V REPEAT Same Day  Result Date: 12/05/2020 CLINICAL DATA:  Chest tube placement. EXAM: CHEST - 1 VIEW SAME DAY COMPARISON:  Chest radiograph earlier same day. FINDINGS: Interval placement of right chest tube with re-expansion of the right lung. There is still a persistent moderate right pneumothorax. Patchy opacities predominately within the mid right lung. Left lung is clear. Normal cardiac and mediastinal contours. IMPRESSION: Interval insertion of right-sided chest tube with re-expansion of the right lung. There is still a persistent moderate right pneumothorax. Patchy opacities throughout the rear-ended right lung may represent re-expansion edema or hemorrhage. Underlying mass or infection not excluded. Short-term follow-up chest radiograph is recommended. Electronically Signed   By: Lovey Newcomer M.D.   On: 12/05/2020 17:16   DG CHEST PORT 1 VIEW  Result Date: 12/05/2020 CLINICAL DATA:  Worsening  tachypnea, status post chest tube insertion. EXAM: PORTABLE CHEST 1 VIEW COMPARISON:  December 05, 2020 (4:44 p.m.) FINDINGS: A right-sided chest tube is seen with its distal end overlying the mediastinum at the level of the carina. Marked severity airspace disease is seen within the mid right lung and right lung base. This is increased in severity when compared to the prior study. There is no evidence of a pleural effusion. A stable right apical pneumothorax is seen. The heart size and mediastinal contours are within normal limits. The visualized skeletal structures are unremarkable. IMPRESSION: 1. Marked severity airspace disease within the mid right lung and right lung base. 2. Right-sided chest tube positioning, as described above, with a stable right apical pneumothorax. Electronically Signed   By: Virgina Norfolk M.D.   On: 12/05/2020 20:47   DG Chest Port 1 View  Result Date: 12/05/2020 CLINICAL DATA:  Shortness of breath.  EXAM: PORTABLE CHEST 1 VIEW COMPARISON:  None. FINDINGS: Large right pneumothorax with complete collapse of the right lung and leftward shift of the cardiomediastinal structures. Left lung is clear. No evidence of pleural effusion. No acute osseous abnormality. IMPRESSION: Large right pneumothorax with evidence of tension physiology. Critical Value/emergent results were called by telephone at the time of interpretation on 12/05/2020 at 4:36 pm to provider [facility] , who verbally acknowledged these results. Electronically Signed   By: Yetta Glassman M.D.   On: 12/05/2020 16:37    EKG: Independently reviewed.  Sinus tachycardia   Assessment/Plan Principal Problem:   Spontaneous pneumothorax Active Problems:   DM type 1 (diabetes mellitus, type 1) ([facility])  Spontaneous pneumothorax-chest x-ray shows large right pneumothorax with evidence of tension physiology, chest tube inserted in ED.  1st Repeat Chest x-ray shows re-expansion.  Second repeat chest x-ray after worsening respiratory status, with tachycardia tachypnea and hypoxia-showed marked severity airspace disease in the right midlung and lung base. - Continue IV Ceftriazone and azithro. -Obtain CTA chest stat- Extensive diffuse right lung airspace disease may represent edema, infection and/or hemorrhage. Left lung is clear. -I talked to pulmonologist on-call Dr. Vaughan Browner, recommended giving Lasix- okay with 20 mg dose, continue antibiotics, high flow nasal cannula, chest tube- place on continous suction to -20. - Admit to [facility] (no beds at [facility], and would benefit from higher level of care)  - Talked to cardiothoracic surgery, Dr. Cyndia Bent, patient will be seen in the morning, took a look at imaging- findings consistent with post-expansion pulmonary edema. Recommended supportive care and keeping patient on the dry side. - Follow up UDS -IV morphine 2 mg every 4 hours as needed for pain - NPO  Acute respiratory failure-O2 sats down to 80s on  room air, placed on 4 L and sats in the mid 80s, started on nonrebreather -Wean to high flow - ABG-patient refused ABG, stat VBG was done.  Diabetes mellitus type 1 with hyperglycemia - random glucose 361 > 600.  Repeat BMP- patient not in DKA. - Sliding scale NovoLog- M q4h - Hold home 70/30 - 40 BID as he is NPO, use Lantus 25 daily  Substance abuse history, last UDS from 2019 positive for cocaine and THC.  Denies current drug use. -Obtain UDS (received morphine and Versed in the ED already   DVT prophylaxis: SCDS for now Code Status: Full code Family Communication: None at bedside Disposition Plan: > 2 days Consults called: CTS  Admission status:  Inpt, Step down I certify that at the point of admission it is my clinical judgment  that the patient will require inpatient hospital care spanning beyond 2 midnights from the point of admission due to high intensity of service, high risk for further deterioration and high frequency of surveillance required. The following factors support the patient status of inpatient:    Bethena Roys MD [facility]  12/05/2020, 12:37 AM

## 2020-12-05 NOTE — Progress Notes (Signed)
Pt arrived to the floor, via care link,  alert and oriented, cardiac monitoring initiated, CHG bath given to the areas allowed by patient. Pt refuses to remove his, he is oriented to staff, room and equipment.  VS obtained,. We'll continue to monitor.

## 2020-12-05 NOTE — ED Provider Notes (Signed)
[facility] EMERGENCY DEPARTMENT Provider Note   CSN: [id number] Arrival date & time: 12/05/20  1431     History Chief Complaint  Patient presents with   Chest Pain    Pedro [name] is a 25 y.o. male.   Chest Pain  This patient is a 25 year old male who is a known diabetic, he is insulin requiring, he takes his medications as prescribed.  He presents to the hospital by private transport after going to the urgent care.  He was therefore coughing pain in his right chest going to his upper back, shortness of breath they noted him to be slightly hyperglycemic and tachycardic and redirected him to the emergency department.  The pain is been going on for 1 week gradually worsening and not associated with any swelling of the legs or fevers.  He has been coughing which is getting worse.  Symptoms are persistent and gradually worsening and became severe today.  No medications given prior to arrival.  Past Medical History:  Diagnosis Date   ADHD (attention deficit hyperactivity disorder)    DM type 1 (diabetes mellitus, type 1) ([place])    diagnosed age 53   Herpes    High cholesterol    Hypertension    Noncompliance with medications     Patient Active Problem List   Diagnosis Date Noted   DKA (diabetic ketoacidosis) ([place]) 11/09/2016   PNA (pneumonia) 07/04/2016   DM type 1 (diabetes mellitus, type 1) ([place]) 07/01/2016   Noncompliance with medications 07/01/2016   Polysubstance abuse ([place])    Tachycardia 10/17/2014   Acute renal failure ([place]) 10/17/2014   DKA (diabetic ketoacidoses) 03/19/2014   Diabetic neuropathy ([place]) 03/19/2014   Dehydration    Gastroenteritis 02/16/2014   Diabetic ketoacidosis without coma associated with type 1 diabetes mellitus ([place])    Genital herpes simplex type 2 12/15/2013   Tobacco abuse 12/15/2013   DKA, type 1 ([place]) 12/15/2013   Diabetic ketoacidosis ([place]) 10/05/2013   Hyperkalemia 10/15/2012   Leukocytosis 10/15/2012   Noncompliance 10/15/2012    Polysubstance dependence ([place]) 07/22/2011    Class: Acute   Substance induced mood disorder ([place]) 07/22/2011    Class: Acute    Past Surgical History:  Procedure Laterality Date   none         Family History  Problem Relation Age of Onset   Diabetes Mellitus I Paternal Grandmother    Diabetes Mellitus I Paternal Aunt    Anxiety disorder Mother    Hypertension Mother    Hypertension Father     Social History   Tobacco Use   Smoking status: Every Day    Packs/day: 1.00    Years: 5.00    Pack years: 5.00    Types: Cigarettes   Smokeless tobacco: Never  Vaping Use   Vaping Use: Every day  Substance Use Topics   Alcohol use: No   Drug use: Not Currently    Types: Marijuana, Other-see comments, Benzodiazepines    Comment: occ    Home Medications Prior to Admission medications   Medication Sig Start Date End Date Taking? Authorizing Provider  clindamycin (CLEOCIN) 300 MG capsule Take 300 mg by mouth 3 (three) times daily. 11/30/20  Yes [provider]  gabapentin (NEURONTIN) 300 MG capsule Take 300 mg by mouth 3 (three) times daily. 10/09/20  Yes [provider]  insulin NPH-regular Human (70-30) 100 UNIT/ML injection Inject 40 Units into the skin 2 (two) times daily.   Yes [provider]  lisinopril (  ZESTRIL) 10 MG tablet Take 10 mg by mouth daily. 08/10/20  Yes [provider]  risperiDONE (RISPERDAL) 3 MG tablet Take 3 mg by mouth daily. 12/02/20  Yes [provider]  SYMBICORT 160-4.5 MCG/ACT inhaler Inhale 2 puffs into the lungs 2 (two) times daily. 11/30/20  Yes [provider]  amoxicillin-clavulanate (AUGMENTIN) 875-125 MG tablet Take 1 tablet by mouth 2 (two) times daily. Take all of this medication Patient not taking: No sig reported 08/28/18   Claretta Fraise, MD  Insulin Glargine (LANTUS SOLOSTAR) 100 UNIT/ML Solostar Pen Inject 50 Units into the skin every morning. Patient not taking: Reported on 12/05/2020  08/03/18 08/03/19  Claretta Fraise, MD  ondansetron (ZOFRAN ODT) 4 MG disintegrating tablet Take 1 tablet (4 mg total) by mouth every 8 (eight) hours as needed for nausea or vomiting. Patient not taking: No sig reported 09/16/18   Sherwood Gambler, MD    Allergies    Sulfa antibiotics  Review of Systems   Review of Systems  Cardiovascular:  Positive for chest pain.  All other systems reviewed and are negative.  Physical Exam Updated Vital Signs BP (!) 128/92   Pulse (!) 119   Temp 97.8 F (36.6 C) (Oral)   Resp (!) 26   Ht 1.803 m (_0 )   Wt 63.5 kg   SpO2 90%   BMI 19.53 kg/m   Physical Exam Vitals and nursing note reviewed.  Constitutional:      General: He is not in acute distress.    Appearance: He is well-developed.  HENT:     Head: Normocephalic and atraumatic.     Mouth/Throat:     Pharynx: No oropharyngeal exudate.  Eyes:     General: No scleral icterus.       Right eye: No discharge.        Left eye: No discharge.     Conjunctiva/sclera: Conjunctivae normal.     Pupils: Pupils are equal, round, and reactive to light.  Neck:     Thyroid: No thyromegaly.     Vascular: No JVD.  Cardiovascular:     Rate and Rhythm: Regular rhythm. Tachycardia present.     Heart sounds: Normal heart sounds. No murmur heard.   No friction rub. No gallop.  Pulmonary:     Effort: Tachypnea and respiratory distress present.     Breath sounds: Decreased breath sounds present. No wheezing or rales.     Comments: The patient has no lung sounds on the right side of the chest which is hyperresonant to percussion.  Left-sided chest appears normal and sounds normal. Abdominal:     General: Bowel sounds are normal. There is no distension.     Palpations: Abdomen is soft. There is no mass.     Tenderness: There is no abdominal tenderness.  Musculoskeletal:        General: No tenderness. Normal range of motion.     Cervical back: Normal range of motion and neck supple.     Right lower  leg: No tenderness. No edema.     Left lower leg: No tenderness. No edema.  Lymphadenopathy:     Cervical: No cervical adenopathy.  Skin:    General: Skin is warm and dry.     Findings: No erythema or rash.  Neurological:     Mental Status: He is alert.     Coordination: Coordination normal.  Psychiatric:        Behavior: Behavior normal.    ED Results / Procedures /  Treatments   Labs (all labs ordered are listed, but only abnormal results are displayed) Labs Reviewed  BASIC METABOLIC PANEL - Abnormal; Notable for the following components:      Result Value   Sodium 133 (*)    Glucose, Bld 361 (*)    BUN 27 (*)    Creatinine, Ser 1.30 (*)    All other components within normal limits  CBC - Abnormal; Notable for the following components:   WBC 12.4 (*)    All other components within normal limits  SARS CORONAVIRUS 2 (TAT 6-24 HRS)  TROPONIN I (HIGH SENSITIVITY)    EKG None  Radiology DG Chest 1V REPEAT Same Day  Result Date: 12/05/2020 CLINICAL DATA:  Chest tube placement. EXAM: CHEST - 1 VIEW SAME DAY COMPARISON:  Chest radiograph earlier same day. FINDINGS: Interval placement of right chest tube with re-expansion of the right lung. There is still a persistent moderate right pneumothorax. Patchy opacities predominately within the mid right lung. Left lung is clear. Normal cardiac and mediastinal contours. IMPRESSION: Interval insertion of right-sided chest tube with re-expansion of the right lung. There is still a persistent moderate right pneumothorax. Patchy opacities throughout the rear-ended right lung may represent re-expansion edema or hemorrhage. Underlying mass or infection not excluded. Short-term follow-up chest radiograph is recommended. Electronically Signed   By: Lovey Newcomer M.D.   On: 12/05/2020 17:16   DG Chest Port 1 View  Result Date: 12/05/2020 CLINICAL DATA:  Shortness of breath. EXAM: PORTABLE CHEST 1 VIEW COMPARISON:  None. FINDINGS: Large right  pneumothorax with complete collapse of the right lung and leftward shift of the cardiomediastinal structures. Left lung is clear. No evidence of pleural effusion. No acute osseous abnormality. IMPRESSION: Large right pneumothorax with evidence of tension physiology. Critical Value/emergent results were called by telephone at the time of interpretation on 12/05/2020 at 4:36 pm to provider [facility] , who verbally acknowledged these results. Electronically Signed   By: Yetta Glassman M.D.   On: 12/05/2020 16:37    Procedures CHEST TUBE INSERTION  Date/Time: 12/05/2020 4:19 PM Performed by: Noemi Chapel, MD Authorized by: Noemi Chapel, MD   Consent:    Consent obtained:  Verbal   Consent given by:  Patient   Risks, benefits, and alternatives were discussed: yes     Risks discussed:  Bleeding, incomplete drainage, nerve damage, pain, infection and damage to surrounding structures   Alternatives discussed:  No treatment Universal protocol:    Procedure explained and questions answered to patient or proxy's satisfaction: yes     Relevant documents present and verified: yes     Test results available: yes     Imaging studies available: yes     Required blood products, implants, devices, and special equipment available: yes     Site/side marked: yes     Immediately prior to procedure, a time out was called: yes     Patient identity confirmed:  Verbally with patient Pre-procedure details:    Skin preparation:  Chlorhexidine   Preparation: Patient was prepped and draped in the usual sterile fashion   Sedation:    Sedation type:  Anxiolysis Anesthesia:    Anesthesia method:  Local infiltration   Local anesthetic:  Lidocaine 1% WITH epi Procedure details:    Placement location:  R lateral   Scalpel size:  11   Tube size (Fr):  Minicatheter   Ultrasound guidance: no     Tension pneumothorax: no     Tube connected to:  Heimlich valve and suction   Drainage characteristics:  Air only    Suture material:  2-0 silk   Dressing:  4x4 sterile gauze and petrolatum-impregnated gauze Post-procedure details:    Post-insertion x-ray findings: tube in good position     Procedure completion:  Tolerated well, no immediate complications Comments:       .Critical Care Performed by: Noemi Chapel, MD Authorized by: Noemi Chapel, MD   Critical care provider statement:    Critical care time (minutes):  35   Critical care time was exclusive of:  Separately billable procedures and treating other patients and teaching time   Critical care was necessary to treat or prevent imminent or life-threatening deterioration of the following conditions:  Respiratory failure (pneumothorax)   Critical care was time spent personally by me on the following activities:  Blood draw for specimens, development of treatment plan with patient or surrogate, discussions with consultants, evaluation of patient's response to treatment, examination of patient, obtaining history from patient or surrogate, ordering and performing treatments and interventions, ordering and review of laboratory studies, ordering and review of radiographic studies, pulse oximetry, re-evaluation of patient's condition and review of old charts Comments:         Medications Ordered in ED Medications  cefTRIAXone (ROCEPHIN) 1 g in sodium chloride 0.9 % 100 mL IVPB (has no administration in time range)  azithromycin (ZITHROMAX) 500 mg in sodium chloride 0.9 % 250 mL IVPB (has no administration in time range)  lidocaine-EPINEPHrine (XYLOCAINE W/EPI) 2 %-1:200000 (PF) injection 20 mL (20 mLs Intradermal Given 12/05/20 1605)  fentaNYL (SUBLIMAZE) injection 100 mcg (100 mcg Intravenous Given 12/05/20 1608)  midazolam (VERSED) injection 2 mg (2 mg Intravenous Given 12/05/20 1608)    ED Course  I have reviewed the triage vital signs and the nursing notes.  Pertinent labs & imaging results that were available during my care of the patient were  reviewed by me and considered in my medical decision making (see chart for details).    MDM Rules/Calculators/A&P                           Patient is critically ill-appearing with a severe almost complete pneumothorax on the right chest.  He has vital signs which reflect a tachycardia but no hypotension, he has no signs of tension pneumothorax clinically he does not need to be rapidly decompressed but does need an immediate chest tube.  We will order medication such as fentanyl Versed and lidocaine, will place chest tube, patient agreeable to the plan.  Chest tube placed, [place] pneumothorax kit used, successful placement clinically on first attempt, chest x-ray ordered for placement.  Vital signs improved, oxygenating better, less shortness of breath, less chest pain.  Will discuss with general surgery to see if they will help manage the chest tube here  On repeat imaging the patient has either reexpansion pulmonary edema, hemorrhage or infection, given his coughing his tachycardia and his white blood cell count we will treat for infection with Rocephin and Zithromax.  The patient has improved significantly after placement of the chest tube and the chest has shown significant reexpansion of the lung though it is not completely reexpanded.  Discussed with Dr. Arnoldo Morale who will assist with chest tube care, will admit to hospital  Final Clinical Impression(s) / ED Diagnoses Final diagnoses:  Primary spontaneous pneumothorax    Rx / DC Orders ED Discharge Orders     None  Noemi Chapel, MD 12/05/20 [phone]

## 2020-12-06 ENCOUNTER — Inpatient Hospital Stay (HOSPITAL_COMMUNITY): Payer: BC Managed Care – PPO

## 2020-12-06 DIAGNOSIS — J9382 Other air leak: Secondary | ICD-10-CM | POA: Diagnosis not present

## 2020-12-06 DIAGNOSIS — J9311 Primary spontaneous pneumothorax: Secondary | ICD-10-CM | POA: Diagnosis not present

## 2020-12-06 DIAGNOSIS — E785 Hyperlipidemia, unspecified: Secondary | ICD-10-CM | POA: Diagnosis not present

## 2020-12-06 DIAGNOSIS — R739 Hyperglycemia, unspecified: Secondary | ICD-10-CM | POA: Diagnosis present

## 2020-12-06 LAB — CBC
HCT: 54 % — ABNORMAL HIGH (ref 39.0–52.0)
Hemoglobin: 17.4 g/dL — ABNORMAL HIGH (ref 13.0–17.0)
MCH: 29.8 pg (ref 26.0–34.0)
MCHC: 32.2 g/dL (ref 30.0–36.0)
MCV: 92.5 fL (ref 80.0–100.0)
Platelets: 403 10*3/uL — ABNORMAL HIGH (ref 150–400)
RBC: 5.84 MIL/uL — ABNORMAL HIGH (ref 4.22–5.81)
RDW: 13 % (ref 11.5–15.5)
WBC: 33.8 10*3/uL — ABNORMAL HIGH (ref 4.0–10.5)
nRBC: 0 % (ref 0.0–0.2)

## 2020-12-06 LAB — BASIC METABOLIC PANEL
Anion gap: 11 (ref 5–15)
Anion gap: 11 (ref 5–15)
BUN: 33 mg/dL — ABNORMAL HIGH (ref 6–20)
BUN: 34 mg/dL — ABNORMAL HIGH (ref 6–20)
CO2: 20 mmol/L — ABNORMAL LOW (ref 22–32)
CO2: 23 mmol/L (ref 22–32)
Calcium: 8.6 mg/dL — ABNORMAL LOW (ref 8.9–10.3)
Calcium: 8.9 mg/dL (ref 8.9–10.3)
Chloride: 100 mmol/L (ref 98–111)
Chloride: 102 mmol/L (ref 98–111)
Creatinine, Ser: 1.61 mg/dL — ABNORMAL HIGH (ref 0.61–1.24)
Creatinine, Ser: 1.76 mg/dL — ABNORMAL HIGH (ref 0.61–1.24)
GFR, Estimated: 54 mL/min — ABNORMAL LOW (ref >60)
GFR, Estimated: 60 mL/min — ABNORMAL LOW (ref >60)
Glucose, Bld: 330 mg/dL — ABNORMAL HIGH (ref 70–99)
Glucose, Bld: 564 mg/dL (ref 70–99)
Potassium: 4.2 mmol/L (ref 3.5–5.1)
Potassium: 5 mmol/L (ref 3.5–5.1)
Sodium: 131 mmol/L — ABNORMAL LOW (ref 135–145)
Sodium: 136 mmol/L (ref 135–145)

## 2020-12-06 LAB — GLUCOSE, CAPILLARY
Glucose-Capillary: 119 mg/dL — ABNORMAL HIGH (ref 70–99)
Glucose-Capillary: 79 mg/dL (ref 70–99)
Glucose-Capillary: 98 mg/dL (ref 70–99)
Glucose-Capillary: 258 mg/dL — ABNORMAL HIGH (ref 70–99)
Glucose-Capillary: 253 mg/dL — ABNORMAL HIGH (ref 70–99)

## 2020-12-06 LAB — SARS CORONAVIRUS 2 (TAT 6-24 HRS): SARS Coronavirus 2: NEGATIVE

## 2020-12-06 LAB — HIV ANTIBODY (ROUTINE TESTING W REFLEX): HIV Screen 4th Generation wRfx: NONREACTIVE

## 2020-12-06 LAB — HEMOGLOBIN A1C
Hgb A1c MFr Bld: 10.5 % — ABNORMAL HIGH (ref 4.8–5.6)
Mean Plasma Glucose: 254.65 mg/dL

## 2020-12-06 MED ORDER — HYDROCODONE-ACETAMINOPHEN 7.5-325 MG PO TABS
1.0000 | ORAL_TABLET | ORAL | Status: DC | PRN
  Administered 2020-12-06 – 2020-12-10 (×9): 1 via ORAL
  Filled 2020-12-06 (×9): qty 1

## 2020-12-06 MED ORDER — INSULIN GLARGINE-YFGN 100 UNIT/ML ~~LOC~~ SOLN
20.0000 [IU] | Freq: Every day | SUBCUTANEOUS | Status: DC
  Filled 2020-12-06: qty 0.2

## 2020-12-06 MED ORDER — INSULIN GLARGINE-YFGN 100 UNIT/ML ~~LOC~~ SOLN
25.0000 [IU] | Freq: Every day | SUBCUTANEOUS | Status: DC
  Administered 2020-12-06 (×2): 25 [IU] via SUBCUTANEOUS
  Filled 2020-12-06 (×3): qty 0.25

## 2020-12-06 MED ORDER — MORPHINE SULFATE (PF) 2 MG/ML IV SOLN
1.0000 mg | Freq: Once | INTRAVENOUS | Status: AC
  Administered 2020-12-06: 1 mg via INTRAVENOUS
  Filled 2020-12-06: qty 1

## 2020-12-06 MED ORDER — INSULIN ASPART PROT & ASPART (70-30 MIX) 100 UNIT/ML ~~LOC~~ SUSP
40.0000 [IU] | Freq: Two times a day (BID) | SUBCUTANEOUS | Status: DC
  Filled 2020-12-06: qty 10

## 2020-12-06 NOTE — Consult Note (Signed)
[address]       [place] [address]             [phone]      Cardiothoracic Surgery Consultation  Reason for Consult: Spontaneous right pneumothorax Referring Physician: Doristine Mango, MD  Pedro [name] is an 25 y.o. male.  HPI:   The patient is a 25 year old gentleman with history of type 1 diabetes, hypertension, hyperlipidemia, ADHD, polysubstance abuse and 1 pack/day smoking who presented to the emergency room with an approximate 1 week history of progressive chest pain and shortness of breath.  He was noted to be tachycardic and tachypneic on presentation and a chest x-ray showed a right tension pneumothorax.  A pigtail chest tube was placed in the emergency department and repeat chest x-ray showed reexpansion of the right lung with development of post expansion pulmonary edema.  There is oxygen saturation was initially 98% on room air but dropped into the 80s and required nonrebreather.  The patient denies ever having any lung problems or spontaneous pneumothorax in the past.  Past Medical History:  Diagnosis Date   ADHD (attention deficit hyperactivity disorder)    DM type 1 (diabetes mellitus, type 1) ([facility])    diagnosed age 34   Herpes    High cholesterol    Hypertension    Noncompliance with medications     Past Surgical History:  Procedure Laterality Date   none      Family History  Problem Relation Age of Onset   Diabetes Mellitus I Paternal Grandmother    Diabetes Mellitus I Paternal Aunt    Anxiety disorder Mother    Hypertension Mother    Hypertension Father     Social History:  reports that he has been smoking cigarettes. He has a 5.00 pack-year smoking history. He has never used smokeless tobacco. He reports that he does not currently use drugs after having used the following drugs: Marijuana, Other-see comments, and Benzodiazepines. He reports that he does not drink alcohol.  Allergies:  Allergies  Allergen Reactions    Sulfa Antibiotics Hives and Other (See Comments)    Reaction:  Fever     Medications: I have reviewed the patient's current medications. Prior to Admission:  Medications Prior to Admission  Medication Sig Dispense Refill Last Dose   clindamycin (CLEOCIN) 300 MG capsule Take 300 mg by mouth 3 (three) times daily.   12/05/2020   gabapentin (NEURONTIN) 300 MG capsule Take 300 mg by mouth 3 (three) times daily.   12/05/2020   insulin NPH-regular Human (70-30) 100 UNIT/ML injection Inject 40 Units into the skin 2 (two) times daily.   12/05/2020   lisinopril (ZESTRIL) 10 MG tablet Take 10 mg by mouth daily.   12/04/2020   risperiDONE (RISPERDAL) 3 MG tablet Take 3 mg by mouth daily.   12/05/2020   SYMBICORT 160-4.5 MCG/ACT inhaler Inhale 2 puffs into the lungs 2 (two) times daily.   12/05/2020   amoxicillin-clavulanate (AUGMENTIN) 875-125 MG tablet Take 1 tablet by mouth 2 (two) times daily. Take all of this medication (Patient not taking: No sig reported) 20 tablet 0 Not Taking   Insulin Glargine (LANTUS SOLOSTAR) 100 UNIT/ML Solostar Pen Inject 50 Units into the skin every morning. (Patient not taking: Reported on 12/05/2020) 5 pen PRN Not Taking   ondansetron (ZOFRAN ODT) 4 MG disintegrating tablet Take 1 tablet (4 mg total) by mouth every 8 (eight) hours as needed for nausea or vomiting. (Patient not taking:  No sig reported) 10 tablet 0 Not Taking   Scheduled:  insulin aspart  0-15 Units Subcutaneous Q4H   insulin glargine-yfgn  25 Units Subcutaneous QHS   risperiDONE  3 mg Oral Daily   Continuous:  azithromycin     cefTRIAXone (ROCEPHIN)  IV     PYK:[id number] **OR** acetaminophen, ondansetron **OR** ondansetron (ZOFRAN) IV Anti-infectives (From admission, onward)    Start     Dose/Rate Route Frequency Ordered Stop   12/06/20 1800  azithromycin (ZITHROMAX) 500 mg in sodium chloride 0.9 % 250 mL IVPB        500 mg 250 mL/hr over 60 Minutes Intravenous Every 24 hours 12/05/20 2309      12/06/20 1700  cefTRIAXone (ROCEPHIN) 1 g in sodium chloride 0.9 % 100 mL IVPB        1 g 200 mL/hr over 30 Minutes Intravenous Every 24 hours 12/05/20 2309     12/05/20 1745  cefTRIAXone (ROCEPHIN) 1 g in sodium chloride 0.9 % 100 mL IVPB        1 g 200 mL/hr over 30 Minutes Intravenous  Once 12/05/20 1730 12/05/20 1854   12/05/20 1745  azithromycin (ZITHROMAX) 500 mg in sodium chloride 0.9 % 250 mL IVPB        500 mg 250 mL/hr over 60 Minutes Intravenous  Once 12/05/20 1730 12/05/20 2118       Results for orders placed or performed during the hospital encounter of 12/05/20 (from the past 48 hour(s))  Basic metabolic panel     Status: Abnormal   Collection Time: 12/05/20  3:11 PM  Result Value Ref Range   Sodium 133 (L) 135 - 145 mmol/L   Potassium 4.2 3.5 - 5.1 mmol/L   Chloride 98 98 - 111 mmol/L   CO2 24 22 - 32 mmol/L   Glucose, Bld 361 (H) 70 - 99 mg/dL    Comment: Glucose reference range applies only to samples taken after fasting for at least 8 hours.   BUN 27 (H) 6 - 20 mg/dL   Creatinine, Ser 1.30 (H) 0.61 - 1.24 mg/dL   Calcium 9.0 8.9 - 10.3 mg/dL   GFR, Estimated >60 >60 mL/min    Comment: (NOTE) Calculated using the CKD-EPI Creatinine Equation (2021)    Anion gap 11 5 - 15    Comment: Performed at [facility], [address], [place], [place] [address]  CBC     Status: Abnormal   Collection Time: 12/05/20  3:11 PM  Result Value Ref Range   WBC 12.4 (H) 4.0 - 10.5 K/uL   RBC 5.15 4.22 - 5.81 MIL/uL   Hemoglobin 15.6 13.0 - 17.0 g/dL   HCT 49.1 39.0 - 52.0 %   MCV 95.3 80.0 - 100.0 fL   MCH 30.3 26.0 - 34.0 pg   MCHC 31.8 30.0 - 36.0 g/dL   RDW 12.9 11.5 - 15.5 %   Platelets 369 150 - 400 K/uL   nRBC 0.0 0.0 - 0.2 %    Comment: Performed at [facility], [address] [place], [place] [address]  Troponin I (High Sensitivity)     Status: None   Collection Time: 12/05/20  3:11 PM  Result Value Ref Range   Troponin I (High Sensitivity) 4 <18 ng/L     Comment: (NOTE) Elevated high sensitivity troponin I (hsTnI) values and significant  changes across serial measurements may suggest ACS but many other  chronic and acute conditions are known to elevate hsTnI results.  Refer  to the "Links" section for chest pain algorithms and additional  guidance. Performed at [facility], [address], [place], [place] [address]   SARS CORONAVIRUS 2 (TAT 6-24 HRS) Nasopharyngeal Nasopharyngeal Swab     Status: None   Collection Time: 12/05/20  4:30 PM   Specimen: Nasopharyngeal Swab  Result Value Ref Range   SARS Coronavirus 2 NEGATIVE NEGATIVE    Comment: (NOTE) SARS-CoV-2 target nucleic acids are NOT DETECTED.  The SARS-CoV-2 RNA is generally detectable in upper and lower respiratory specimens during the acute phase of infection. Negative results do not preclude SARS-CoV-2 infection, do not rule out co-infections with other pathogens, and should not be used as the sole basis for treatment or other patient management decisions. Negative results must be combined with clinical observations, patient history, and epidemiological information. The expected result is Negative.  Fact Sheet for Patients: SugarRoll.be  Fact Sheet for Healthcare Providers: https://www.woods-mathews.com/  This test is not yet approved or cleared by the [place] FDA and  has been authorized for detection and/or diagnosis of SARS-CoV-2 by FDA under an Emergency Use Authorization (EUA). This EUA will remain  in effect (meaning this test can be used) for the duration of the COVID-19 declaration under Se ction 564(b)(1) of the Act, 21 U.S.C. section 360bbb-3(b)(1), unless the authorization is terminated or revoked sooner.  Performed at [facility], [place] [address], [place], [place] [address]   CBG monitoring, ED     Status: Abnormal   Collection Time: 12/05/20  9:31 PM  Result Value Ref Range   Glucose-Capillary >600  (HH) 70 - 99 mg/dL    Comment: Glucose reference range applies only to samples taken after fasting for at least 8 hours.  Urine rapid drug screen (hosp performed)     Status: Abnormal   Collection Time: 12/05/20  9:51 PM  Result Value Ref Range   Opiates POSITIVE (A) NONE DETECTED   Cocaine NONE DETECTED NONE DETECTED   Benzodiazepines POSITIVE (A) NONE DETECTED   Amphetamines POSITIVE (A) NONE DETECTED   Tetrahydrocannabinol POSITIVE (A) NONE DETECTED   Barbiturates NONE DETECTED NONE DETECTED    Comment: (NOTE) DRUG SCREEN FOR MEDICAL PURPOSES ONLY.  IF CONFIRMATION IS NEEDED FOR ANY PURPOSE, NOTIFY LAB WITHIN 5 DAYS.  LOWEST DETECTABLE LIMITS FOR URINE DRUG SCREEN Drug Class                     Cutoff (ng/mL) Amphetamine and metabolites    1000 Barbiturate and metabolites    200 Benzodiazepine                 010 Tricyclics and metabolites     300 Opiates and metabolites        300 Cocaine and metabolites        300 THC                            50 Performed at [facility], [address], [place], [place] [address]   Glucose, capillary     Status: Abnormal   Collection Time: 12/05/20 11:30 PM  Result Value Ref Range   Glucose-Capillary 548 (HH) 70 - 99 mg/dL    Comment: Glucose reference range applies only to samples taken after fasting for at least 8 hours.   Comment 1 Notify RN   Basic metabolic panel     Status: Abnormal   Collection Time: 12/05/20 11:36 PM  Result Value  Ref Range   Sodium 131 (L) 135 - 145 mmol/L   Potassium 5.0 3.5 - 5.1 mmol/L   Chloride 100 98 - 111 mmol/L   CO2 20 (L) 22 - 32 mmol/L   Glucose, Bld 564 (HH) 70 - 99 mg/dL    Comment: Glucose reference range applies only to samples taken after fasting for at least 8 hours. CRITICAL RESULT CALLED TO, READ BACK BY AND VERIFIED WITH: TIM IRBY RN 12/06/20 0017 M KOROLESKI    BUN 34 (H) 6 - 20 mg/dL   Creatinine, Ser 1.76 (H) 0.61 - 1.24 mg/dL   Calcium 8.6 (L) 8.9 - 10.3 mg/dL   GFR, Estimated  54 (L) >60 mL/min    Comment: (NOTE) Calculated using the CKD-EPI Creatinine Equation (2021)    Anion gap 11 5 - 15    Comment: Performed at [facility] [address] [place], [place] [address]  CBC     Status: Abnormal   Collection Time: 12/05/20 11:36 PM  Result Value Ref Range   WBC 33.7 (H) 4.0 - 10.5 K/uL   RBC 5.87 (H) 4.22 - 5.81 MIL/uL   Hemoglobin 17.9 (H) 13.0 - 17.0 g/dL   HCT 54.1 (H) 39.0 - 52.0 %   MCV 92.2 80.0 - 100.0 fL   MCH 30.5 26.0 - 34.0 pg   MCHC 33.1 30.0 - 36.0 g/dL   RDW 12.8 11.5 - 15.5 %   Platelets 372 150 - 400 K/uL   nRBC 0.0 0.0 - 0.2 %    Comment: Performed at [facility], [place] [address] [place], [place] [address]  HIV Antibody (routine testing w rflx)     Status: None   Collection Time: 12/05/20 11:36 PM  Result Value Ref Range   HIV Screen 4th Generation wRfx Non Reactive Non Reactive    Comment: Performed at [facility], [place] [address], [place], [place] [address]  Basic metabolic panel     Status: Abnormal   Collection Time: 12/06/20  1:10 AM  Result Value Ref Range   Sodium 136 135 - 145 mmol/L   Potassium 4.2 3.5 - 5.1 mmol/L   Chloride 102 98 - 111 mmol/L   CO2 23 22 - 32 mmol/L   Glucose, Bld 330 (H) 70 - 99 mg/dL    Comment: Glucose reference range applies only to samples taken after fasting for at least 8 hours.   BUN 33 (H) 6 - 20 mg/dL   Creatinine, Ser 1.61 (H) 0.61 - 1.24 mg/dL   Calcium 8.9 8.9 - 10.3 mg/dL   GFR, Estimated 60 (L) >60 mL/min    Comment: (NOTE) Calculated using the CKD-EPI Creatinine Equation (2021)    Anion gap 11 5 - 15    Comment: Performed at [facility] [address], [place], [place] [address]  CBC     Status: Abnormal   Collection Time: 12/06/20  1:10 AM  Result Value Ref Range   WBC 33.8 (H) 4.0 - 10.5 K/uL   RBC 5.84 (H) 4.22 - 5.81 MIL/uL   Hemoglobin 17.4 (H) 13.0 - 17.0 g/dL   HCT 54.0 (H) 39.0 - 52.0 %   MCV 92.5 80.0 - 100.0 fL   MCH 29.8 26.0 - 34.0 pg    MCHC 32.2 30.0 - 36.0 g/dL   RDW 13.0 11.5 - 15.5 %   Platelets 403 (H) 150 - 400 K/uL   nRBC 0.0 0.0 - 0.2 %    Comment:  Performed at [facility], [place] [address] [place], [place] [address]  Hemoglobin A1c     Status: Abnormal   Collection Time: 12/06/20  1:10 AM  Result Value Ref Range   Hgb A1c MFr Bld 10.5 (H) 4.8 - 5.6 %    Comment: (NOTE) Pre diabetes:          5.7%-6.4%  Diabetes:              >6.4%  Glycemic control for   <7.0% adults with diabetes    Mean Plasma Glucose 254.65 mg/dL    Comment: Performed at [facility] [address], [place], [place] [address]  Glucose, capillary     Status: Abnormal   Collection Time: 12/06/20  4:08 AM  Result Value Ref Range   Glucose-Capillary 119 (H) 70 - 99 mg/dL    Comment: Glucose reference range applies only to samples taken after fasting for at least 8 hours.  Glucose, capillary     Status: None   Collection Time: 12/06/20  8:23 AM  Result Value Ref Range   Glucose-Capillary 79 70 - 99 mg/dL    Comment: Glucose reference range applies only to samples taken after fasting for at least 8 hours.    CT Angio Chest Pulmonary Embolism (PE) W or WO Contrast  Result Date: 12/05/2020 CLINICAL DATA:  Spontaneous pneumothorax. EXAM: CT ANGIOGRAPHY CHEST WITH CONTRAST TECHNIQUE: Multidetector CT imaging of the chest was performed using the standard protocol during bolus administration of intravenous contrast. Multiplanar CT image reconstructions and MIPs were obtained to evaluate the vascular anatomy. CONTRAST:  Intravenous contrast was administered COMPARISON:  Chest x-ray 12/05/2020. FINDINGS: Cardiovascular: There is preferential opacification of the thoracic aorta. The thoracic aorta appears within normal limits. The pulmonary arteries are not well opacified. Pulmonary emboli cannot be excluded on this exam. The heart is normal in size. There is no pericardial effusion. Mediastinum/Nodes: There are prominent subcarinal and  right hilar lymph nodes. No definite enlarged lymph nodes are seen. Esophagus is nondilated. Visualized thyroid gland is within normal limits. Lungs/Pleura: Right-sided chest tube is present with distal tip in the anteromedial pleural space. There is a 15% right pneumothorax. There is no mediastinal shift. Emphysematous changes are seen in both lung apices, right greater than left. There is a small simple right pleural effusion is well. There are diffuse airspace opacities throughout the right lung. Left lung is clear. Trachea and central airways are patent. Upper Abdomen: No acute abnormality. Musculoskeletal: There is mild right chest wall emphysema secondary to chest tube. No acute fractures are seen. Review of the MIP images confirms the above findings. IMPRESSION: 1. Poor opacification of pulmonary arteries secondary to timing of contrast bolus. CT exclude pulmonary embolism on the study. 2. Small right hydropneumothorax with right chest tube in place. 3. Extensive diffuse right lung airspace disease may represent edema, infection and/or hemorrhage. Left lung is clear. Electronically Signed   By: Ronney Asters M.D.   On: 12/05/2020 22:22   DG Chest 1V REPEAT Same Day  Result Date: 12/05/2020 CLINICAL DATA:  Chest tube placement. EXAM: CHEST - 1 VIEW SAME DAY COMPARISON:  Chest radiograph earlier same day. FINDINGS: Interval placement of right chest tube with re-expansion of the right lung. There is still a persistent moderate right pneumothorax. Patchy opacities predominately within the mid right lung. Left lung is clear. Normal cardiac and mediastinal contours. IMPRESSION: Interval insertion of right-sided chest tube with re-expansion of the right lung. There is still  a persistent moderate right pneumothorax. Patchy opacities throughout the rear-ended right lung may represent re-expansion edema or hemorrhage. Underlying mass or infection not excluded. Short-term follow-up chest radiograph is recommended.  Electronically Signed   By: Lovey Newcomer M.D.   On: 12/05/2020 17:16   DG CHEST PORT 1 VIEW  Result Date: 12/06/2020 CLINICAL DATA:  Follow-up pneumothorax. EXAM: PORTABLE CHEST 1 VIEW COMPARISON:  December 05, 2020 FINDINGS: A right apical pneumothorax remains measuring up to 3.1 cm, not significantly changed. Opacity diffusely throughout the aerated right lung is similar in the interval. A right chest tube remains, pulled back a little in the interval. The left lung is clear. The cardiomediastinal silhouette is unchanged. IMPRESSION: 1. The right chest tube is been pulled back several cm but remains in adequate position. The right apical pneumothorax is stable. 2. Stable diffuse opacity throughout the aerated right lung. 3. No other abnormalities. Electronically Signed   By: Dorise Bullion III M.D.   On: 12/06/2020 10:01   DG CHEST PORT 1 VIEW  Result Date: 12/05/2020 CLINICAL DATA:  Worsening tachypnea, status post chest tube insertion. EXAM: PORTABLE CHEST 1 VIEW COMPARISON:  December 05, 2020 (4:44 p.m.) FINDINGS: A right-sided chest tube is seen with its distal end overlying the mediastinum at the level of the carina. Marked severity airspace disease is seen within the mid right lung and right lung base. This is increased in severity when compared to the prior study. There is no evidence of a pleural effusion. A stable right apical pneumothorax is seen. The heart size and mediastinal contours are within normal limits. The visualized skeletal structures are unremarkable. IMPRESSION: 1. Marked severity airspace disease within the mid right lung and right lung base. 2. Right-sided chest tube positioning, as described above, with a stable right apical pneumothorax. Electronically Signed   By: Virgina Norfolk M.D.   On: 12/05/2020 20:47   DG Chest Port 1 View  Result Date: 12/05/2020 CLINICAL DATA:  Shortness of breath. EXAM: PORTABLE CHEST 1 VIEW COMPARISON:  None. FINDINGS: Large right pneumothorax with  complete collapse of the right lung and leftward shift of the cardiomediastinal structures. Left lung is clear. No evidence of pleural effusion. No acute osseous abnormality. IMPRESSION: Large right pneumothorax with evidence of tension physiology. Critical Value/emergent results were called by telephone at the time of interpretation on 12/05/2020 at 4:36 pm to provider [facility] , who verbally acknowledged these results. Electronically Signed   By: Yetta Glassman M.D.   On: 12/05/2020 16:37    Review of Systems  Constitutional:  Negative for chills and fever.  HENT: Negative.    Eyes: Negative.   Respiratory:  Positive for shortness of breath.   Cardiovascular:  Positive for chest pain.  Gastrointestinal: Negative.   Genitourinary: Negative.   Musculoskeletal:  Positive for back pain.  Neurological:  Negative for dizziness and syncope.  Hematological: Negative.   Blood pressure 101/70, pulse (!) 117, temperature (!) 97.3 F (36.3 C), temperature source Oral, resp. rate (!) 21, height 5\' 11"  (1.803 m), weight 61.2 kg, SpO2 94 %. Physical Exam Constitutional:      Appearance: He is well-developed. He is ill-appearing.  HENT:     Head: Normocephalic and atraumatic.  Eyes:     Extraocular Movements: Extraocular movements intact.     Pupils: Pupils are equal, round, and reactive to light.  Cardiovascular:     Rate and Rhythm: Regular rhythm. Tachycardia present.     Pulses: Normal pulses.  Heart sounds: Normal heart sounds. No murmur heard. Pulmonary:     Effort: Pulmonary effort is normal.     Breath sounds: Rales present.     Comments: Chest tube has continuous 1 chamber air leak.  Airleak is audible on auscultation. Abdominal:     General: Abdomen is flat. Bowel sounds are normal. There is no distension.     Palpations: Abdomen is soft.     Tenderness: There is no abdominal tenderness.  Musculoskeletal:        General: No swelling.  Skin:    General: Skin is warm and  dry.     Comments: Multiple tattoos  Neurological:     General: No focal deficit present.     Mental Status: He is alert and oriented to person, place, and time.  Psychiatric:        Mood and Affect: Mood normal.        Behavior: Behavior normal.    Narrative & Impression  CLINICAL DATA:  Spontaneous pneumothorax.   EXAM: CT ANGIOGRAPHY CHEST WITH CONTRAST   TECHNIQUE: Multidetector CT imaging of the chest was performed using the standard protocol during bolus administration of intravenous contrast. Multiplanar CT image reconstructions and MIPs were obtained to evaluate the vascular anatomy.   CONTRAST:  Intravenous contrast was administered   COMPARISON:  Chest x-ray 12/05/2020.   FINDINGS: Cardiovascular: There is preferential opacification of the thoracic aorta. The thoracic aorta appears within normal limits. The pulmonary arteries are not well opacified. Pulmonary emboli cannot be excluded on this exam. The heart is normal in size. There is no pericardial effusion.   Mediastinum/Nodes: There are prominent subcarinal and right hilar lymph nodes. No definite enlarged lymph nodes are seen. Esophagus is nondilated. Visualized thyroid gland is within normal limits.   Lungs/Pleura: Right-sided chest tube is present with distal tip in the anteromedial pleural space. There is a 15% right pneumothorax. There is no mediastinal shift. Emphysematous changes are seen in both lung apices, right greater than left. There is a small simple right pleural effusion is well.   There are diffuse airspace opacities throughout the right lung. Left lung is clear. Trachea and central airways are patent.   Upper Abdomen: No acute abnormality.   Musculoskeletal: There is mild right chest wall emphysema secondary to chest tube. No acute fractures are seen.   Review of the MIP images confirms the above findings.   IMPRESSION: 1. Poor opacification of pulmonary arteries secondary to  timing of contrast bolus. CT exclude pulmonary embolism on the study. 2. Small right hydropneumothorax with right chest tube in place. 3. Extensive diffuse right lung airspace disease may represent edema, infection and/or hemorrhage. Left lung is clear.     Electronically Signed   By: Ronney Asters M.D.   On: 12/05/2020 22:22     Assessment/Plan:  This 25 year old gentleman has spontaneous right pneumothorax with tension treated with a chest tube followed by development of post expansion pulmonary edema.  CTA of the chest shows severe bullous emphysema of the apical portion of the right upper lobe and also the apex of the left upper lobe to a lesser degree.  He has a continuous 1 chamber air leak at this time.  I would keep his chest tube on suction and follow-up chest x-rays.  Given the degree of bullous changes in his lung it is very likely he will require surgical treatment but ideally we would like to let the post expansion pulmonary edema resolve first.  I would  recommend conservative treatment with pulmonary toilet, keeping him on the dry side, and using prophylactic antibiotic to prevent pneumonia.  We will continue to follow his progress.  Fernande Boyden Jahmad Petrich 12/06/2020, 11:04 AM

## 2020-12-06 NOTE — Progress Notes (Signed)
PROGRESS NOTE  Pedro [name]    DOB: [birth year], 25 y.o.  DXI:[id number]  PCP: Neale Burly, MD   Code Status: Full Code   DOA: 12/05/2020   LOS: 1  Brief Narrative of Current Hospitalization  Pedro [name] is a 25 y.o. male with a PMH significant for type 1 diabetes, polysubstance use. They presented from home to the urgent care and then ED on 12/05/2020 with chest pain and difficulty breathing, nausea, lightheadedness x several days. In the ED, it was found that they had right-sided spontaneous tension pneumothorax. They were treated with chest tube.  Patient was admitted to medicine service for further workup and management of pneumothorax as outlined in detail below.  12/06/20 -stable with pain at chest tube insertion site  Assessment & Plan  Principal Problem:   Spontaneous pneumothorax Active Problems:   DM type 1 (diabetes mellitus, type 1) (HCC)   Hyperglycemia  Spontaneous R pneumothorax- HR 114, RR 20, BP 91/64, 97% O2 on 5L HFNC this am. Chest tube in right chest placed yesterday.  100 cc drainage yesterday. - repeat chest xray to monitor progress this am.  - cardiothoracic surgery to follow  -No emergent plan for surgery so will allow patient to have a diet today -Norco every 4 hours as needed for pain   Acute respiratory failure-currently on 5L HFNC at 97% and stable. VBG was done - azithromycin and CTX started in ED and continued for prophylaxis. -Wean from high flow as tolerated   Poorly controlled diabetes mellitus type 1 with hyperglycemia - hgb A1c 10.5 this admission. Fasting glucose 119 and patient has been NPO.  - Sliding scale NovoLog- M q4h - Hold home 70/30 - 40 BID as he is NPO, use Lantus 25 daily   Substance abuse history- Denies current drug use to admitting physician.  UDS positive for opiates, benzodiazepines, amphetamines, THC.  Of note, he had received morphine and Versed in the ED prior to collection -Monitor for signs of  withdrawal  Continue home risperidone  DVT prophylaxis: SCDs Start: 12/05/20 2310   Diet:  Diet Orders (From admission, onward)     Start     Ordered   12/05/20 2310  Diet NPO time specified  Diet effective now        12/05/20 2309            Subjective 12/06/20    Pt reports minimal respiratory symptoms but having pain at chest tube insertion site  Disposition Plan & Communication  Status is: Inpatient  Remains inpatient appropriate because:Inpatient level of care appropriate due to severity of illness  Dispo: The patient is from: Home              Anticipated d/c is to: Home              Patient currently is not medically stable to d/c.   Difficult to place patient No   Family Communication: None  Consults, Procedures, Significant Events  Consultants:  Cardiothoracic surgery  Procedures/significant events:  Right-sided chest tube  Antimicrobials:  Anti-infectives (From admission, onward)    Start     Dose/Rate Route Frequency Ordered Stop   12/06/20 1800  azithromycin (ZITHROMAX) 500 mg in sodium chloride 0.9 % 250 mL IVPB        500 mg 250 mL/hr over 60 Minutes Intravenous Every 24 hours 12/05/20 2309     12/06/20 1700  cefTRIAXone (ROCEPHIN) 1 g in sodium chloride 0.9 % 100 mL IVPB  1 g 200 mL/hr over 30 Minutes Intravenous Every 24 hours 12/05/20 2309     12/05/20 1745  cefTRIAXone (ROCEPHIN) 1 g in sodium chloride 0.9 % 100 mL IVPB        1 g 200 mL/hr over 30 Minutes Intravenous  Once 12/05/20 1730 12/05/20 1854   12/05/20 1745  azithromycin (ZITHROMAX) 500 mg in sodium chloride 0.9 % 250 mL IVPB        500 mg 250 mL/hr over 60 Minutes Intravenous  Once 12/05/20 1730 12/05/20 2118        Objective   Vitals:   12/06/20 0121 12/06/20 0222 12/06/20 0258 12/06/20 0404  BP: 92/73   91/64  Pulse: (!) 125  (!) 131 (!) 114  Resp:   19 20  Temp: 98.1 F (36.7 C)   98.1 F (36.7 C)  TempSrc:    Oral  SpO2:  97% 96% 97%  Weight:       Height:        Intake/Output Summary (Last 24 hours) at 12/06/2020 0751 Last data filed at 12/06/2020 8676 Gross per 24 hour  Intake 100 ml  Output 1005 ml  Net -905 ml   Filed Weights   12/05/20 1459 12/05/20 2250  Weight: 63.5 kg 61.2 kg    Patient BMI: Body mass index is 18.83 kg/m.   Physical Exam: General: awake, alert, NAD HEENT: atraumatic, clear conjunctiva, anicteric sclera, moist mucus membranes, hearing grossly normal Respiratory: Shallow breathing, no accessory muscle use, negative wheezing Cardiovascular: normal S1/S2, RRR, no JVD, murmurs, rubs, gallops, quick capillary refill  Gastrointestinal: soft, NT, ND, no HSM felt Nervous: A&O x4. no gross focal neurologic deficits, normal speech Extremities: moves all equally, no edema, normal tone Skin: Moist, intact, normal temperature, normal color, No rashes, lesions or ulcers Psychiatry: Mildly anxious  Labs   I have personally reviewed following labs and imaging studies Admission on 12/05/2020  Component Date Value Ref Range Status   Sodium 12/05/2020 133 (A) 135 - 145 mmol/L Final   Potassium 12/05/2020 4.2  3.5 - 5.1 mmol/L Final   Chloride 12/05/2020 98  98 - 111 mmol/L Final   CO2 12/05/2020 24  22 - 32 mmol/L Final   Glucose, Bld 12/05/2020 361 (A) 70 - 99 mg/dL Final   BUN 12/05/2020 27 (A) 6 - 20 mg/dL Final   Creatinine, Ser 12/05/2020 1.30 (A) 0.61 - 1.24 mg/dL Final   Calcium 12/05/2020 9.0  8.9 - 10.3 mg/dL Final   GFR, Estimated 12/05/2020 >60  >60 mL/min Final   Anion gap 12/05/2020 11  5 - 15 Final   WBC 12/05/2020 12.4 (A) 4.0 - 10.5 K/uL Final   RBC 12/05/2020 5.15  4.22 - 5.81 MIL/uL Final   Hemoglobin 12/05/2020 15.6  13.0 - 17.0 g/dL Final   HCT 12/05/2020 49.1  39.0 - 52.0 % Final   MCV 12/05/2020 95.3  80.0 - 100.0 fL Final   MCH 12/05/2020 30.3  26.0 - 34.0 pg Final   MCHC 12/05/2020 31.8  30.0 - 36.0 g/dL Final   RDW 12/05/2020 12.9  11.5 - 15.5 % Final   Platelets 12/05/2020 369  150  - 400 K/uL Final   nRBC 12/05/2020 0.0  0.0 - 0.2 % Final   Troponin I (High Sensitivity) 12/05/2020 4  <18 ng/L Final   SARS Coronavirus 2 12/05/2020 NEGATIVE  NEGATIVE Final   Opiates 12/05/2020 POSITIVE (A) NONE DETECTED Final   Cocaine 12/05/2020 NONE DETECTED  NONE DETECTED Final  Benzodiazepines 12/05/2020 POSITIVE (A) NONE DETECTED Final   Amphetamines 12/05/2020 POSITIVE (A) NONE DETECTED Final   Tetrahydrocannabinol 12/05/2020 POSITIVE (A) NONE DETECTED Final   Barbiturates 12/05/2020 NONE DETECTED  NONE DETECTED Final   Glucose-Capillary 12/05/2020 >600 (A) 70 - 99 mg/dL Final   Sodium 12/05/2020 131 (A) 135 - 145 mmol/L Final   Potassium 12/05/2020 5.0  3.5 - 5.1 mmol/L Final   Chloride 12/05/2020 100  98 - 111 mmol/L Final   CO2 12/05/2020 20 (A) 22 - 32 mmol/L Final   Glucose, Bld 12/05/2020 564 (A) 70 - 99 mg/dL Final   BUN 12/05/2020 34 (A) 6 - 20 mg/dL Final   Creatinine, Ser 12/05/2020 1.76 (A) 0.61 - 1.24 mg/dL Final   Calcium 12/05/2020 8.6 (A) 8.9 - 10.3 mg/dL Final   GFR, Estimated 12/05/2020 54 (A) >60 mL/min Final   Anion gap 12/05/2020 11  5 - 15 Final   WBC 12/05/2020 33.7 (A) 4.0 - 10.5 K/uL Final   RBC 12/05/2020 5.87 (A) 4.22 - 5.81 MIL/uL Final   Hemoglobin 12/05/2020 17.9 (A) 13.0 - 17.0 g/dL Final   HCT 12/05/2020 54.1 (A) 39.0 - 52.0 % Final   MCV 12/05/2020 92.2  80.0 - 100.0 fL Final   MCH 12/05/2020 30.5  26.0 - 34.0 pg Final   MCHC 12/05/2020 33.1  30.0 - 36.0 g/dL Final   RDW 12/05/2020 12.8  11.5 - 15.5 % Final   Platelets 12/05/2020 372  150 - 400 K/uL Final   nRBC 12/05/2020 0.0  0.0 - 0.2 % Final   HIV Screen 4th Generation wRfx 12/05/2020 Non Reactive  Non Reactive Final   Sodium 12/06/2020 136  135 - 145 mmol/L Final   Potassium 12/06/2020 4.2  3.5 - 5.1 mmol/L Final   Chloride 12/06/2020 102  98 - 111 mmol/L Final   CO2 12/06/2020 23  22 - 32 mmol/L Final   Glucose, Bld 12/06/2020 330 (A) 70 - 99 mg/dL Final   BUN 12/06/2020 33 (A) 6 -  20 mg/dL Final   Creatinine, Ser 12/06/2020 1.61 (A) 0.61 - 1.24 mg/dL Final   Calcium 12/06/2020 8.9  8.9 - 10.3 mg/dL Final   GFR, Estimated 12/06/2020 60 (A) >60 mL/min Final   Anion gap 12/06/2020 11  5 - 15 Final   WBC 12/06/2020 33.8 (A) 4.0 - 10.5 K/uL Final   RBC 12/06/2020 5.84 (A) 4.22 - 5.81 MIL/uL Final   Hemoglobin 12/06/2020 17.4 (A) 13.0 - 17.0 g/dL Final   HCT 12/06/2020 54.0 (A) 39.0 - 52.0 % Final   MCV 12/06/2020 92.5  80.0 - 100.0 fL Final   MCH 12/06/2020 29.8  26.0 - 34.0 pg Final   MCHC 12/06/2020 32.2  30.0 - 36.0 g/dL Final   RDW 12/06/2020 13.0  11.5 - 15.5 % Final   Platelets 12/06/2020 403 (A) 150 - 400 K/uL Final   nRBC 12/06/2020 0.0  0.0 - 0.2 % Final   Glucose-Capillary 12/05/2020 548 (A) 70 - 99 mg/dL Final   Comment 1 12/05/2020 Notify RN   Final   Hgb A1c MFr Bld 12/06/2020 10.5 (A) 4.8 - 5.6 % Final   Mean Plasma Glucose 12/06/2020 254.65  mg/dL Final   Glucose-Capillary 12/06/2020 119 (A) 70 - 99 mg/dL Final    Imaging Studies  CT Angio Chest Pulmonary Embolism (PE) W or WO Contrast  Result Date: 12/05/2020 CLINICAL DATA:  Spontaneous pneumothorax. EXAM: CT ANGIOGRAPHY CHEST WITH CONTRAST TECHNIQUE: Multidetector CT imaging of the chest was performed using  the standard protocol during bolus administration of intravenous contrast. Multiplanar CT image reconstructions and MIPs were obtained to evaluate the vascular anatomy. CONTRAST:  Intravenous contrast was administered COMPARISON:  Chest x-ray 12/05/2020. FINDINGS: Cardiovascular: There is preferential opacification of the thoracic aorta. The thoracic aorta appears within normal limits. The pulmonary arteries are not well opacified. Pulmonary emboli cannot be excluded on this exam. The heart is normal in size. There is no pericardial effusion. Mediastinum/Nodes: There are prominent subcarinal and right hilar lymph nodes. No definite enlarged lymph nodes are seen. Esophagus is nondilated. Visualized  thyroid gland is within normal limits. Lungs/Pleura: Right-sided chest tube is present with distal tip in the anteromedial pleural space. There is a 15% right pneumothorax. There is no mediastinal shift. Emphysematous changes are seen in both lung apices, right greater than left. There is a small simple right pleural effusion is well. There are diffuse airspace opacities throughout the right lung. Left lung is clear. Trachea and central airways are patent. Upper Abdomen: No acute abnormality. Musculoskeletal: There is mild right chest wall emphysema secondary to chest tube. No acute fractures are seen. Review of the MIP images confirms the above findings. IMPRESSION: 1. Poor opacification of pulmonary arteries secondary to timing of contrast bolus. CT exclude pulmonary embolism on the study. 2. Small right hydropneumothorax with right chest tube in place. 3. Extensive diffuse right lung airspace disease may represent edema, infection and/or hemorrhage. Left lung is clear. Electronically Signed   By: Ronney Asters M.D.   On: 12/05/2020 22:22   DG Chest 1V REPEAT Same Day  Result Date: 12/05/2020 CLINICAL DATA:  Chest tube placement. EXAM: CHEST - 1 VIEW SAME DAY COMPARISON:  Chest radiograph earlier same day. FINDINGS: Interval placement of right chest tube with re-expansion of the right lung. There is still a persistent moderate right pneumothorax. Patchy opacities predominately within the mid right lung. Left lung is clear. Normal cardiac and mediastinal contours. IMPRESSION: Interval insertion of right-sided chest tube with re-expansion of the right lung. There is still a persistent moderate right pneumothorax. Patchy opacities throughout the rear-ended right lung may represent re-expansion edema or hemorrhage. Underlying mass or infection not excluded. Short-term follow-up chest radiograph is recommended. Electronically Signed   By: Lovey Newcomer M.D.   On: 12/05/2020 17:16   DG CHEST PORT 1 VIEW  Result  Date: 12/05/2020 CLINICAL DATA:  Worsening tachypnea, status post chest tube insertion. EXAM: PORTABLE CHEST 1 VIEW COMPARISON:  December 05, 2020 (4:44 p.m.) FINDINGS: A right-sided chest tube is seen with its distal end overlying the mediastinum at the level of the carina. Marked severity airspace disease is seen within the mid right lung and right lung base. This is increased in severity when compared to the prior study. There is no evidence of a pleural effusion. A stable right apical pneumothorax is seen. The heart size and mediastinal contours are within normal limits. The visualized skeletal structures are unremarkable. IMPRESSION: 1. Marked severity airspace disease within the mid right lung and right lung base. 2. Right-sided chest tube positioning, as described above, with a stable right apical pneumothorax. Electronically Signed   By: Virgina Norfolk M.D.   On: 12/05/2020 20:47   DG Chest Port 1 View  Result Date: 12/05/2020 CLINICAL DATA:  Shortness of breath. EXAM: PORTABLE CHEST 1 VIEW COMPARISON:  None. FINDINGS: Large right pneumothorax with complete collapse of the right lung and leftward shift of the cardiomediastinal structures. Left lung is clear. No evidence of pleural effusion. No acute  osseous abnormality. IMPRESSION: Large right pneumothorax with evidence of tension physiology. Critical Value/emergent results were called by telephone at the time of interpretation on 12/05/2020 at 4:36 pm to provider [facility] , who verbally acknowledged these results. Electronically Signed   By: Yetta Glassman M.D.   On: 12/05/2020 16:37   Medications   Scheduled Meds:  insulin aspart  0-15 Units Subcutaneous Q4H   insulin glargine-yfgn  25 Units Subcutaneous QHS   risperiDONE  3 mg Oral Daily     LOS: 1 day   Time spent: >29min  Sanvika Cuttino L Sheretha Shadd, DO [facility] 12/06/2020, 7:51 AM   To contact the [facility] Attending or Consulting provider for this patient: Check the care team in  [facility] for a) attending/consulting [place] provider listed and b) the [facility] team listed Log into [email or web address] and use [place]'s universal password to access. If you do not have the password, please contact the hospital operator. Locate the [facility] provider you are looking for under [facility] and page to a number that you can be directly reached. If you still have difficulty reaching the provider, please page the [facility] (Director on Call) for the Hospitalists listed on amion for assistance.

## 2020-12-07 ENCOUNTER — Inpatient Hospital Stay (HOSPITAL_COMMUNITY): Payer: BC Managed Care – PPO

## 2020-12-07 ENCOUNTER — Encounter (HOSPITAL_COMMUNITY): Payer: Self-pay | Admitting: Internal Medicine

## 2020-12-07 DIAGNOSIS — J9311 Primary spontaneous pneumothorax: Secondary | ICD-10-CM | POA: Diagnosis not present

## 2020-12-07 DIAGNOSIS — E109 Type 1 diabetes mellitus without complications: Secondary | ICD-10-CM

## 2020-12-07 LAB — CBC
HCT: 45.3 % (ref 39.0–52.0)
Hemoglobin: 14.4 g/dL (ref 13.0–17.0)
MCH: 29.8 pg (ref 26.0–34.0)
MCHC: 31.8 g/dL (ref 30.0–36.0)
MCV: 93.8 fL (ref 80.0–100.0)
Platelets: 306 10*3/uL (ref 150–400)
RBC: 4.83 MIL/uL (ref 4.22–5.81)
RDW: 13.1 % (ref 11.5–15.5)
WBC: 13.7 10*3/uL — ABNORMAL HIGH (ref 4.0–10.5)
nRBC: 0 % (ref 0.0–0.2)

## 2020-12-07 LAB — GLUCOSE, CAPILLARY
Glucose-Capillary: 108 mg/dL — ABNORMAL HIGH (ref 70–99)
Glucose-Capillary: 124 mg/dL — ABNORMAL HIGH (ref 70–99)
Glucose-Capillary: 131 mg/dL — ABNORMAL HIGH (ref 70–99)
Glucose-Capillary: 155 mg/dL — ABNORMAL HIGH (ref 70–99)
Glucose-Capillary: 156 mg/dL — ABNORMAL HIGH (ref 70–99)
Glucose-Capillary: 178 mg/dL — ABNORMAL HIGH (ref 70–99)
Glucose-Capillary: 54 mg/dL — ABNORMAL LOW (ref 70–99)

## 2020-12-07 MED ORDER — LIDOCAINE HCL (PF) 1 % IJ SOLN
INTRAMUSCULAR | Status: AC
  Filled 2020-12-07: qty 5

## 2020-12-07 MED ORDER — INSULIN GLARGINE-YFGN 100 UNIT/ML ~~LOC~~ SOLN
15.0000 [IU] | Freq: Every day | SUBCUTANEOUS | Status: DC
  Administered 2020-12-07 – 2020-12-10 (×4): 15 [IU] via SUBCUTANEOUS
  Filled 2020-12-07 (×5): qty 0.15

## 2020-12-07 NOTE — Progress Notes (Signed)
TCTS   Subjective: Complains of pain in right chest  Objective: Vital signs in last 24 hours: Temp:  [97.5 F (36.4 C)-98 F (36.7 C)] 98 F (36.7 C) (10/03 1146) Pulse Rate:  [85-98] 85 (10/03 1146) Cardiac Rhythm: Normal sinus rhythm (10/03 0701) Resp:  [15-19] 18 (10/03 1146) BP: (102-113)/(72-85) 111/78 (10/03 1146) SpO2:  [93 %-98 %] 97 % (10/03 1146)  Hemodynamic parameters for last 24 hours:    Intake/Output from previous day: 10/02 0701 - 10/03 0700 In: 350 [IV Piggyback:350] Out: 5379 [Urine:1050; Chest Tube:75] Intake/Output this shift: No intake/output data recorded.  General appearance: alert and cooperative Heart: regular rate and rhythm Lungs: diminished breath sounds right lung Chest tube has continuous 1 chamber air leak  Lab Results: Recent Labs    12/06/20 0110 12/07/20 0106  WBC 33.8* 13.7*  HGB 17.4* 14.4  HCT 54.0* 45.3  PLT 403* 306   BMET:  Recent Labs    12/05/20 2336 12/06/20 0110  NA 131* 136  K 5.0 4.2  CL 100 102  CO2 20* 23  GLUCOSE 564* 330*  BUN 34* 33*  CREATININE 1.76* 1.61*  CALCIUM 8.6* 8.9    PT/INR: No results for input(s): LABPROT, INR in the last 72 hours. ABG    Component Value Date/Time   PHART 7.336 (L) 04/30/2014 1215   HCO3 28.9 (H) 09/16/2018 1338   TCO2 11.3 04/30/2014 1215   ACIDBASEDEF 14.5 (H) 11/09/2016 1745   O2SAT 90.7 09/16/2018 1338   CBG (last 3)  Recent Labs    12/07/20 0506 12/07/20 0941 12/07/20 1145  GLUCAP 124* 131* 108*   CXR this am shows increased right ptx.  Assessment/Plan:  Spontaneous right ptx with post expansion pulmonary edema. The ptx looks larger this am on CXR and it may be that tube was kinked. There is a continuous but small air leak which that tube should be able to handle unless it gets kinked. It is in fissure which is not ideal. He has extensive RUL bullous emphysema and I think he will require surgery to stop this air leak but would like to wait until edema is  improved to decrease the risk of surgery and intraop, postop hypoxemia. Will repeat the CXR now. May need another tube inserted if lung does not expand.  LOS: 2 days    Gaye Pollack 12/07/2020

## 2020-12-07 NOTE — Progress Notes (Signed)
PROGRESS NOTE  Pedro [name]    DOB: 08/30/1995, 25 y.o.  CVE:[id number]  PCP: Neale Burly, MD   Code Status: Full Code   DOA: 12/05/2020   LOS: 2  Brief Narrative of Current Hospitalization  Pedro [name] is a 25 y.o. male with a PMH significant for type 1 diabetes, polysubstance use. They presented from home to the urgent care and then ED on 12/05/2020 with chest pain and difficulty breathing, nausea, lightheadedness x several days. In the ED, it was found that they had right-sided spontaneous tension pneumothorax. They were treated with chest tube.  Patient was admitted to medicine service for further workup and management of pneumothorax as outlined in detail below.  12/07/20 - pain at chest tube insertion site, chest xray today worsening from yesterday  Assessment & Plan  Principal Problem:   Spontaneous pneumothorax Active Problems:   DM type 1 (diabetes mellitus, type 1) (HCC)   Hyperglycemia  Spontaneous R pneumothorax- on 5L HFNC this am and hemodynamically stable. Chest tube in right chest.  - repeat chest xray to monitor progress this am shows worsening of R lung collapse in comparison to yesterday with air in soft tissue  - cardiothoracic surgery to follow - making him NPO until evaluation by CVTS -Norco every 4 hours as needed for pain   Acute respiratory failure-currently on 5L HFNC at 97% and stable.  - azithromycin and CTX started in ED and continued for prophylaxis. -Wean from high flow as tolerated   Poorly controlled diabetes mellitus type 1 with hyperglycemia - hgb A1c 10.5 this admission. Hypoglycemic event this morning which was asymptomatic. CBG 54 and increased to 124 with some juice.  - decrease semglee to 15u daily.  - Sliding scale NovoLog- M q4h   Substance abuse history- Denies current drug use to admitting physician.  UDS positive for opiates, benzodiazepines, amphetamines, THC.  Of note, he had received morphine and Versed in the ED prior  to collection -Monitor for signs of withdrawal  Continue home risperidone  DVT prophylaxis: SCDs Start: 12/05/20 2310   Diet:  Diet Orders (From admission, onward)     Start     Ordered   12/07/20 0740  Diet NPO time specified  Diet effective now        12/07/20 0739            Subjective 12/07/20    Pt reports stable respiratory symptoms. He has worsening of chest pain at tube insertion site.   Disposition Plan & Communication  Status is: Inpatient  Remains inpatient appropriate because:Inpatient level of care appropriate due to severity of illness  Dispo: The patient is from: Home              Anticipated d/c is to: Home              Patient currently is not medically stable to d/c.   Difficult to place patient No   Family Communication: None  Consults, Procedures, Significant Events  Consultants:  Cardiothoracic surgery  Procedures/significant events:  Right-sided chest tube  Antimicrobials:  Anti-infectives (From admission, onward)    Start     Dose/Rate Route Frequency Ordered Stop   12/06/20 1800  azithromycin (ZITHROMAX) 500 mg in sodium chloride 0.9 % 250 mL IVPB        500 mg 250 mL/hr over 60 Minutes Intravenous Every 24 hours 12/05/20 2309     12/06/20 1700  cefTRIAXone (ROCEPHIN) 1 g in sodium chloride 0.9 % 100  mL IVPB        1 g 200 mL/hr over 30 Minutes Intravenous Every 24 hours 12/05/20 2309     12/05/20 1745  cefTRIAXone (ROCEPHIN) 1 g in sodium chloride 0.9 % 100 mL IVPB        1 g 200 mL/hr over 30 Minutes Intravenous  Once 12/05/20 1730 12/05/20 1854   12/05/20 1745  azithromycin (ZITHROMAX) 500 mg in sodium chloride 0.9 % 250 mL IVPB        500 mg 250 mL/hr over 60 Minutes Intravenous  Once 12/05/20 1730 12/05/20 2118        Objective   Vitals:   12/07/20 0024 12/07/20 0358 12/07/20 0841 12/07/20 1146  BP: 102/81 112/79 113/85 111/78  Pulse: 90 93 87 85  Resp: 15 18 18 18   Temp: (!) 97.5 F (36.4 C) 97.7 F (36.5 C) 97.6 F  (36.4 C) 98 F (36.7 C)  TempSrc: Oral Oral Oral Oral  SpO2: 95% 97% 98% 97%  Weight:      Height:        Intake/Output Summary (Last 24 hours) at 12/07/2020 1213 Last data filed at 12/07/2020 0700 Gross per 24 hour  Intake 350 ml  Output 1125 ml  Net -775 ml   Filed Weights   12/05/20 1459 12/05/20 2250  Weight: 63.5 kg 61.2 kg    Patient BMI: Body mass index is 18.83 kg/m.   Physical Exam: General: awake, alert, NAD HEENT: atraumatic, clear conjunctiva, anicteric sclera, moist mucus membranes, hearing grossly normal Respiratory: Shallow breathing, no accessory muscle use, negative wheezing. No crepitus at chest tube insertion site.  Cardiovascular: normal S1/S2, RRR, no JVD, murmurs, rubs, gallops, quick capillary refill  Gastrointestinal: soft, NT, ND, no HSM felt Nervous: A&O x4. no gross focal neurologic deficits, normal speech Extremities: moves all equally, no edema, normal tone Skin: Moist, intact, normal temperature, normal color, No rashes, lesions or ulcers Psychiatry: Mildly anxious  Labs   I have personally reviewed following labs and imaging studies Admission on 12/05/2020  Component Date Value Ref Range Status   Sodium 12/05/2020 133 (A) 135 - 145 mmol/L Final   Potassium 12/05/2020 4.2  3.5 - 5.1 mmol/L Final   Chloride 12/05/2020 98  98 - 111 mmol/L Final   CO2 12/05/2020 24  22 - 32 mmol/L Final   Glucose, Bld 12/05/2020 361 (A) 70 - 99 mg/dL Final   BUN 12/05/2020 27 (A) 6 - 20 mg/dL Final   Creatinine, Ser 12/05/2020 1.30 (A) 0.61 - 1.24 mg/dL Final   Calcium 12/05/2020 9.0  8.9 - 10.3 mg/dL Final   GFR, Estimated 12/05/2020 >60  >60 mL/min Final   Anion gap 12/05/2020 11  5 - 15 Final   WBC 12/05/2020 12.4 (A) 4.0 - 10.5 K/uL Final   RBC 12/05/2020 5.15  4.22 - 5.81 MIL/uL Final   Hemoglobin 12/05/2020 15.6  13.0 - 17.0 g/dL Final   HCT 12/05/2020 49.1  39.0 - 52.0 % Final   MCV 12/05/2020 95.3  80.0 - 100.0 fL Final   MCH 12/05/2020 30.3  26.0 -  34.0 pg Final   MCHC 12/05/2020 31.8  30.0 - 36.0 g/dL Final   RDW 12/05/2020 12.9  11.5 - 15.5 % Final   Platelets 12/05/2020 369  150 - 400 K/uL Final   nRBC 12/05/2020 0.0  0.0 - 0.2 % Final   Troponin I (High Sensitivity) 12/05/2020 4  <18 ng/L Final   SARS Coronavirus 2 12/05/2020 NEGATIVE  NEGATIVE Final  Opiates 12/05/2020 POSITIVE (A) NONE DETECTED Final   Cocaine 12/05/2020 NONE DETECTED  NONE DETECTED Final   Benzodiazepines 12/05/2020 POSITIVE (A) NONE DETECTED Final   Amphetamines 12/05/2020 POSITIVE (A) NONE DETECTED Final   Tetrahydrocannabinol 12/05/2020 POSITIVE (A) NONE DETECTED Final   Barbiturates 12/05/2020 NONE DETECTED  NONE DETECTED Final   Glucose-Capillary 12/05/2020 >600 (A) 70 - 99 mg/dL Final   Sodium 12/05/2020 131 (A) 135 - 145 mmol/L Final   Potassium 12/05/2020 5.0  3.5 - 5.1 mmol/L Final   Chloride 12/05/2020 100  98 - 111 mmol/L Final   CO2 12/05/2020 20 (A) 22 - 32 mmol/L Final   Glucose, Bld 12/05/2020 564 (A) 70 - 99 mg/dL Final   BUN 12/05/2020 34 (A) 6 - 20 mg/dL Final   Creatinine, Ser 12/05/2020 1.76 (A) 0.61 - 1.24 mg/dL Final   Calcium 12/05/2020 8.6 (A) 8.9 - 10.3 mg/dL Final   GFR, Estimated 12/05/2020 54 (A) >60 mL/min Final   Anion gap 12/05/2020 11  5 - 15 Final   WBC 12/05/2020 33.7 (A) 4.0 - 10.5 K/uL Final   RBC 12/05/2020 5.87 (A) 4.22 - 5.81 MIL/uL Final   Hemoglobin 12/05/2020 17.9 (A) 13.0 - 17.0 g/dL Final   HCT 12/05/2020 54.1 (A) 39.0 - 52.0 % Final   MCV 12/05/2020 92.2  80.0 - 100.0 fL Final   MCH 12/05/2020 30.5  26.0 - 34.0 pg Final   MCHC 12/05/2020 33.1  30.0 - 36.0 g/dL Final   RDW 12/05/2020 12.8  11.5 - 15.5 % Final   Platelets 12/05/2020 372  150 - 400 K/uL Final   nRBC 12/05/2020 0.0  0.0 - 0.2 % Final   HIV Screen 4th Generation wRfx 12/05/2020 Non Reactive  Non Reactive Final   Sodium 12/06/2020 136  135 - 145 mmol/L Final   Potassium 12/06/2020 4.2  3.5 - 5.1 mmol/L Final   Chloride 12/06/2020 102  98 - 111  mmol/L Final   CO2 12/06/2020 23  22 - 32 mmol/L Final   Glucose, Bld 12/06/2020 330 (A) 70 - 99 mg/dL Final   BUN 12/06/2020 33 (A) 6 - 20 mg/dL Final   Creatinine, Ser 12/06/2020 1.61 (A) 0.61 - 1.24 mg/dL Final   Calcium 12/06/2020 8.9  8.9 - 10.3 mg/dL Final   GFR, Estimated 12/06/2020 60 (A) >60 mL/min Final   Anion gap 12/06/2020 11  5 - 15 Final   WBC 12/06/2020 33.8 (A) 4.0 - 10.5 K/uL Final   RBC 12/06/2020 5.84 (A) 4.22 - 5.81 MIL/uL Final   Hemoglobin 12/06/2020 17.4 (A) 13.0 - 17.0 g/dL Final   HCT 12/06/2020 54.0 (A) 39.0 - 52.0 % Final   MCV 12/06/2020 92.5  80.0 - 100.0 fL Final   MCH 12/06/2020 29.8  26.0 - 34.0 pg Final   MCHC 12/06/2020 32.2  30.0 - 36.0 g/dL Final   RDW 12/06/2020 13.0  11.5 - 15.5 % Final   Platelets 12/06/2020 403 (A) 150 - 400 K/uL Final   nRBC 12/06/2020 0.0  0.0 - 0.2 % Final   Glucose-Capillary 12/05/2020 548 (A) 70 - 99 mg/dL Final   Comment 1 12/05/2020 Notify RN   Final   Hgb A1c MFr Bld 12/06/2020 10.5 (A) 4.8 - 5.6 % Final   Mean Plasma Glucose 12/06/2020 254.65  mg/dL Final   Glucose-Capillary 12/06/2020 119 (A) 70 - 99 mg/dL Final   Glucose-Capillary 12/06/2020 79  70 - 99 mg/dL Final   Glucose-Capillary 12/06/2020 98  70 - 99 mg/dL Final  Glucose-Capillary 12/06/2020 258 (A) 70 - 99 mg/dL Final   WBC 12/07/2020 13.7 (A) 4.0 - 10.5 K/uL Final   RBC 12/07/2020 4.83  4.22 - 5.81 MIL/uL Final   Hemoglobin 12/07/2020 14.4  13.0 - 17.0 g/dL Final   HCT 12/07/2020 45.3  39.0 - 52.0 % Final   MCV 12/07/2020 93.8  80.0 - 100.0 fL Final   MCH 12/07/2020 29.8  26.0 - 34.0 pg Final   MCHC 12/07/2020 31.8  30.0 - 36.0 g/dL Final   RDW 12/07/2020 13.1  11.5 - 15.5 % Final   Platelets 12/07/2020 306  150 - 400 K/uL Final   nRBC 12/07/2020 0.0  0.0 - 0.2 % Final   Glucose-Capillary 12/06/2020 253 (A) 70 - 99 mg/dL Final   Comment 1 12/06/2020 Notify RN   Final   Comment 2 12/06/2020 Document in Chart   Final   Glucose-Capillary 12/07/2020 155  (A) 70 - 99 mg/dL Final   Comment 1 12/07/2020 Notify RN   Final   Comment 2 12/07/2020 Document in Chart   Final   Glucose-Capillary 12/07/2020 54 (A) 70 - 99 mg/dL Final   Comment 1 12/07/2020 Notify RN   Final   Comment 2 12/07/2020 Document in Chart   Final   Glucose-Capillary 12/07/2020 124 (A) 70 - 99 mg/dL Final   Glucose-Capillary 12/07/2020 131 (A) 70 - 99 mg/dL Final   Glucose-Capillary 12/07/2020 108 (A) 70 - 99 mg/dL Final   Comment 1 12/07/2020 Notify RN   Final   Comment 2 12/07/2020 Document in Chart   Final    Imaging Studies  DG CHEST PORT 1 VIEW  Result Date: 12/07/2020 CLINICAL DATA:  Sob, PTX,chest tube present EXAM: PORTABLE CHEST 1 VIEW COMPARISON:  12/06/2020 and older exams FINDINGS: Right-sided pneumothorax is increased in size, estimated at 30%. There is no shift the mediastinum. Right lung airspace opacity is without change allowing for the additional right lung collapse since the previous day's exam. Left lung remains clear.  No left pneumothorax. Right chest tube projects at the medial base of the right hemithorax. Right lateral chest wall subcutaneous emphysema has increased. IMPRESSION: 1. Increased size of the right pneumothorax, now estimated at 30%. No midline shift to suggest tension. No change in the position of the right-sided chest tube. 2. Mild increase in right lateral chest wall subcutaneous emphysema. 3. No other change.  Persistent consolidation in the right lung. Electronically Signed   By: Lajean Manes M.D.   On: 12/07/2020 08:22   Medications   Scheduled Meds:  insulin aspart  0-15 Units Subcutaneous Q4H   insulin glargine-yfgn  15 Units Subcutaneous QHS   risperiDONE  3 mg Oral Daily     LOS: 2 days   Time spent: >87min  Krystie Leiter L Marry Kusch, DO [facility] 12/07/2020, 12:13 PM   To contact the [facility] Attending or Consulting provider for this patient: Check the care team in [facility] for a) attending/consulting [place] provider listed and b) the  [facility] team listed Log into [email or web address] and use [facility] [place]'s universal password to access. If you do not have the password, please contact the hospital operator. Locate the [facility] provider you are looking for under [facility] and page to a number that you can be directly reached. If you still have difficulty reaching the provider, please page the [facility] (Director on Call) for the Hospitalists listed on amion for assistance.

## 2020-12-07 NOTE — CV Procedure (Signed)
Chest Tube Insertion Procedure Note  Indications:  Clinically significant RightPneumothorax  Pre-operative Diagnosis: Large Right Pneumothorax  Post-operative Diagnosis: Large Right Pneumothorax  Procedure Details  Informed consent was obtained for the procedure, including sedation.  Risks of lung perforation, hemorrhage, arrhythmia, and adverse drug reaction were discussed.   After sterile skin prep, using standard technique, a 20 French tube was placed in the right lateral 6th rib space.  Findings: None  Estimated Blood Loss:  Minimal         Specimens:  None              Complications:  None; patient tolerated the procedure well.         Disposition:  procedure performed at bedside         Condition: stable  Attending Attestation: I performed the procedure.   CXR pending.  Persistent 2 chamber air leak

## 2020-12-07 NOTE — Progress Notes (Signed)
Hypoglycemic Event  CBG: 54  Treatment: 8 oz juice/soda  Symptoms: Hungry  Follow-up CBG: Time:0500 CBG Result:124   Possible Reasons for Event: Inadequate meal intake  Comments/MD notified:n/a    Susann Givens, Dene Gentry

## 2020-12-07 NOTE — Progress Notes (Signed)
Inpatient Diabetes Program Recommendations  AACE/ADA: New Consensus Statement on Inpatient Glycemic Control (2015)  Target Ranges:  Prepandial:   less than 140 mg/dL      Peak postprandial:   less than 180 mg/dL (1-2 hours)      Critically ill patients:  140 - 180 mg/dL   Lab Results  Component Value Date   GLUCAP 131 (H) 12/07/2020   HGBA1C 10.5 (H) 12/06/2020    Review of Glycemic Control Results for Pedro [name], Pedro [name] (MRN [id number]) as of 12/07/2020 09:49  Ref. Range 12/07/2020 00:28 12/07/2020 04:02 12/07/2020 05:06 12/07/2020 09:41  Glucose-Capillary Latest Ref Range: 70 - 99 mg/dL 155 (H) 54 (L) 124 (H) 131 (H)   Diabetes history: Type 1 DM Outpatient Diabetes medications: Novolog 70/30 40 units BID Current orders for Inpatient glycemic control: Semglee 15 units QD, Novolog 0-15 units Q4H  Inpatient Diabetes Program Recommendations:    Noted hypoglycemia this AM of 54 mg/dL and subsequent insulin adjustments. In agreement as patient NPO. Following.   Thanks, Bronson Curb, MSN, RNC-OB Diabetes Coordinator [phone] (8a-5p)

## 2020-12-07 NOTE — Progress Notes (Signed)
Patient ID: Pedro [name], male   DOB: 05-Sep-1995, 25 y.o.   MRN: [id number] TCTS: Followup CXR this afternoon shows further enlargement of right ptx. This will require a new chest tube. I discussed with the patient including the likelihood that he may still require surgery for persistent air leak after the pulmonary edema resolves. He agrees to proceed.

## 2020-12-08 ENCOUNTER — Inpatient Hospital Stay (HOSPITAL_COMMUNITY): Payer: BC Managed Care – PPO

## 2020-12-08 DIAGNOSIS — Z9689 Presence of other specified functional implants: Secondary | ICD-10-CM

## 2020-12-08 DIAGNOSIS — J9311 Primary spontaneous pneumothorax: Principal | ICD-10-CM

## 2020-12-08 LAB — GLUCOSE, CAPILLARY
Glucose-Capillary: 101 mg/dL — ABNORMAL HIGH (ref 70–99)
Glucose-Capillary: 127 mg/dL — ABNORMAL HIGH (ref 70–99)
Glucose-Capillary: 163 mg/dL — ABNORMAL HIGH (ref 70–99)
Glucose-Capillary: 228 mg/dL — ABNORMAL HIGH (ref 70–99)
Glucose-Capillary: 250 mg/dL — ABNORMAL HIGH (ref 70–99)
Glucose-Capillary: 262 mg/dL — ABNORMAL HIGH (ref 70–99)
Glucose-Capillary: 303 mg/dL — ABNORMAL HIGH (ref 70–99)

## 2020-12-08 LAB — CBC
HCT: 47.5 % (ref 39.0–52.0)
Hemoglobin: 15.3 g/dL (ref 13.0–17.0)
MCH: 29.7 pg (ref 26.0–34.0)
MCHC: 32.2 g/dL (ref 30.0–36.0)
MCV: 92.1 fL (ref 80.0–100.0)
Platelets: 310 10*3/uL (ref 150–400)
RBC: 5.16 MIL/uL (ref 4.22–5.81)
RDW: 12.2 % (ref 11.5–15.5)
WBC: 8.9 10*3/uL (ref 4.0–10.5)
nRBC: 0 % (ref 0.0–0.2)

## 2020-12-08 MED ORDER — INSULIN ASPART 100 UNIT/ML IJ SOLN
0.0000 [IU] | Freq: Three times a day (TID) | INTRAMUSCULAR | Status: DC
  Administered 2020-12-08 (×2): 3 [IU] via SUBCUTANEOUS
  Administered 2020-12-09: 5 [IU] via SUBCUTANEOUS
  Administered 2020-12-09: 11 [IU] via SUBCUTANEOUS
  Administered 2020-12-09: 5 [IU] via SUBCUTANEOUS
  Administered 2020-12-10: 6 [IU] via SUBCUTANEOUS

## 2020-12-08 MED ORDER — LIDOCAINE 5 % EX PTCH
1.0000 | MEDICATED_PATCH | CUTANEOUS | Status: DC
  Administered 2020-12-08 – 2020-12-22 (×15): 1 via TRANSDERMAL
  Filled 2020-12-08 (×15): qty 1

## 2020-12-08 MED ORDER — MORPHINE SULFATE (PF) 2 MG/ML IV SOLN
1.0000 mg | INTRAVENOUS | Status: DC | PRN
  Administered 2020-12-08 – 2020-12-10 (×8): 1 mg via INTRAVENOUS
  Filled 2020-12-08 (×8): qty 1

## 2020-12-08 MED ORDER — MORPHINE BOLUS VIA INFUSION: 1.0000 mg | INTRAVENOUS | Status: DC | PRN

## 2020-12-08 NOTE — Progress Notes (Signed)
PROGRESS NOTE  Pedro [name]    DOB: 07-16-1995, 25 y.o.  TTS:[id number]  PCP: Neale Burly, MD   Code Status: Full Code   DOA: 12/05/2020   LOS: 3  Brief Narrative of Current Hospitalization  Pedro [name] is a 25 y.o. male with a PMH significant for type 1 diabetes, polysubstance use. They presented from home to the urgent care and then ED on 12/05/2020 with chest pain and difficulty breathing, nausea, lightheadedness x several days. In the ED, it was found that they had right-sided spontaneous tension pneumothorax. They were treated with chest tube.  Patient was admitted to medicine service for further workup and management of pneumothorax as outlined in detail below.  12/08/20 - stable with chest tube- improved pneumothorax status from yesterday.   Assessment & Plan  Principal Problem:   Spontaneous pneumothorax Active Problems:   DM type 1 (diabetes mellitus, type 1) (HCC)   Hyperglycemia  Spontaneous R pneumothorax- on 5L HFNC this am and hemodynamically stable. Chest tube in right chest.  - repeat chest xray to monitor progress this am shows improvement from initial yesterday reinsertion of new chest tube. - cardiothoracic surgery to follow  - planning blood per section on 10/6  -Lidocaine patches -Norco every 4 hours as needed for pain -CBC a.m.   Acute respiratory failure-currently on 5L HFNC at 97% and stable.  - azithromycin (10/1-10/3) - CTX (10/1-10/7) for pneumonia prophylaxis, per CVTS -Wean from high flow as tolerated   Poorly controlled diabetes mellitus type 1 with hyperglycemia - hgb A1c 10.5 this admission.  Fasting glucose 163 this morning. -Continue semglee to 15u daily.  -Moderate sliding scale insulin with meals -Dextrose as needed   Substance abuse history- Denies current drug use to admitting physician.  UDS positive for opiates, benzodiazepines, amphetamines, THC.  Of note, he had received morphine and Versed in the ED prior to  collection -Monitor for signs of withdrawal  Continue home risperidone  DVT prophylaxis: SCDs Start: 12/05/20 2310   Diet:  Diet Orders (From admission, onward)     Start     Ordered   12/07/20 1523  Diet heart healthy/carb modified Room service appropriate? Yes; Fluid consistency: Thin  Diet effective now       Question Answer Comment  Diet-HS Snack? Nothing   Room service appropriate? Yes   Fluid consistency: Thin      12/07/20 1522            Subjective 12/08/20    Patient sleeping comfortably this morning.  Disposition Plan & Communication  Status is: Inpatient  Remains inpatient appropriate because:Inpatient level of care appropriate due to severity of illness  Dispo: The patient is from: Home              Anticipated d/c is to: Home              Patient currently is not medically stable to d/c.   Difficult to place patient No   Family Communication: None  Consults, Procedures, Significant Events  Consultants:  Cardiothoracic surgery  Procedures/significant events:  Right-sided chest tube x2  Antimicrobials:  Anti-infectives (From admission, onward)    Start     Dose/Rate Route Frequency Ordered Stop   12/06/20 1800  azithromycin (ZITHROMAX) 500 mg in sodium chloride 0.9 % 250 mL IVPB  Status:  Discontinued        500 mg 250 mL/hr over 60 Minutes Intravenous Every 24 hours 12/05/20 2309 12/07/20 1354   12/06/20 1700  cefTRIAXone (ROCEPHIN) 1 g in sodium chloride 0.9 % 100 mL IVPB        1 g 200 mL/hr over 30 Minutes Intravenous Every 24 hours 12/05/20 2309     12/05/20 1745  cefTRIAXone (ROCEPHIN) 1 g in sodium chloride 0.9 % 100 mL IVPB        1 g 200 mL/hr over 30 Minutes Intravenous  Once 12/05/20 1730 12/05/20 1854   12/05/20 1745  azithromycin (ZITHROMAX) 500 mg in sodium chloride 0.9 % 250 mL IVPB        500 mg 250 mL/hr over 60 Minutes Intravenous  Once 12/05/20 1730 12/05/20 2118        Objective   Vitals:   12/08/20 0007 12/08/20  0342 12/08/20 0352 12/08/20 0732  BP: 128/90 116/71  114/81  Pulse: 97 78  97  Resp: 16 16 14 17   Temp: 97.8 F (36.6 C) 97.8 F (36.6 C)  (!) 97.4 F (36.3 C)  TempSrc: Oral Oral  Oral  SpO2: 98% 97% 98% 95%  Weight:      Height:        Intake/Output Summary (Last 24 hours) at 12/08/2020 0746 Last data filed at 12/08/2020 0400 Gross per 24 hour  Intake 250 ml  Output 18 ml  Net 232 ml    Filed Weights   12/05/20 1459 12/05/20 2250  Weight: 63.5 kg 61.2 kg    Patient BMI: Body mass index is 18.83 kg/m.   Physical Exam: General: awake, alert, NAD HEENT: atraumatic, clear conjunctiva, anicteric sclera, moist mucus membranes, hearing grossly normal Respiratory: Shallow breathing, no accessory muscle use, negative wheezing. No crepitus at chest tube insertion site.  Cardiovascular: normal S1/S2, RRR, no JVD, murmurs, rubs, gallops, quick capillary refill  Gastrointestinal: soft, NT, ND, no HSM felt Nervous: A&O x4. no gross focal neurologic deficits, normal speech Extremities: moves all equally, no edema, normal tone Skin: Moist, intact, normal temperature, normal color, No rashes, lesions or ulcers Psychiatry: Mildly anxious  Labs   I have personally reviewed following labs and imaging studies Admission on 12/05/2020  Component Date Value Ref Range Status   Sodium 12/05/2020 133 (A) 135 - 145 mmol/L Final   Potassium 12/05/2020 4.2  3.5 - 5.1 mmol/L Final   Chloride 12/05/2020 98  98 - 111 mmol/L Final   CO2 12/05/2020 24  22 - 32 mmol/L Final   Glucose, Bld 12/05/2020 361 (A) 70 - 99 mg/dL Final   BUN 12/05/2020 27 (A) 6 - 20 mg/dL Final   Creatinine, Ser 12/05/2020 1.30 (A) 0.61 - 1.24 mg/dL Final   Calcium 12/05/2020 9.0  8.9 - 10.3 mg/dL Final   GFR, Estimated 12/05/2020 >60  >60 mL/min Final   Anion gap 12/05/2020 11  5 - 15 Final   WBC 12/05/2020 12.4 (A) 4.0 - 10.5 K/uL Final   RBC 12/05/2020 5.15  4.22 - 5.81 MIL/uL Final   Hemoglobin 12/05/2020 15.6  13.0 -  17.0 g/dL Final   HCT 12/05/2020 49.1  39.0 - 52.0 % Final   MCV 12/05/2020 95.3  80.0 - 100.0 fL Final   MCH 12/05/2020 30.3  26.0 - 34.0 pg Final   MCHC 12/05/2020 31.8  30.0 - 36.0 g/dL Final   RDW 12/05/2020 12.9  11.5 - 15.5 % Final   Platelets 12/05/2020 369  150 - 400 K/uL Final   nRBC 12/05/2020 0.0  0.0 - 0.2 % Final   Troponin I (High Sensitivity) 12/05/2020 4  <18 ng/L Final  SARS Coronavirus 2 12/05/2020 NEGATIVE  NEGATIVE Final   Opiates 12/05/2020 POSITIVE (A) NONE DETECTED Final   Cocaine 12/05/2020 NONE DETECTED  NONE DETECTED Final   Benzodiazepines 12/05/2020 POSITIVE (A) NONE DETECTED Final   Amphetamines 12/05/2020 POSITIVE (A) NONE DETECTED Final   Tetrahydrocannabinol 12/05/2020 POSITIVE (A) NONE DETECTED Final   Barbiturates 12/05/2020 NONE DETECTED  NONE DETECTED Final   Glucose-Capillary 12/05/2020 >600 (A) 70 - 99 mg/dL Final   Sodium 12/05/2020 131 (A) 135 - 145 mmol/L Final   Potassium 12/05/2020 5.0  3.5 - 5.1 mmol/L Final   Chloride 12/05/2020 100  98 - 111 mmol/L Final   CO2 12/05/2020 20 (A) 22 - 32 mmol/L Final   Glucose, Bld 12/05/2020 564 (A) 70 - 99 mg/dL Final   BUN 12/05/2020 34 (A) 6 - 20 mg/dL Final   Creatinine, Ser 12/05/2020 1.76 (A) 0.61 - 1.24 mg/dL Final   Calcium 12/05/2020 8.6 (A) 8.9 - 10.3 mg/dL Final   GFR, Estimated 12/05/2020 54 (A) >60 mL/min Final   Anion gap 12/05/2020 11  5 - 15 Final   WBC 12/05/2020 33.7 (A) 4.0 - 10.5 K/uL Final   RBC 12/05/2020 5.87 (A) 4.22 - 5.81 MIL/uL Final   Hemoglobin 12/05/2020 17.9 (A) 13.0 - 17.0 g/dL Final   HCT 12/05/2020 54.1 (A) 39.0 - 52.0 % Final   MCV 12/05/2020 92.2  80.0 - 100.0 fL Final   MCH 12/05/2020 30.5  26.0 - 34.0 pg Final   MCHC 12/05/2020 33.1  30.0 - 36.0 g/dL Final   RDW 12/05/2020 12.8  11.5 - 15.5 % Final   Platelets 12/05/2020 372  150 - 400 K/uL Final   nRBC 12/05/2020 0.0  0.0 - 0.2 % Final   HIV Screen 4th Generation wRfx 12/05/2020 Non Reactive  Non Reactive Final    Sodium 12/06/2020 136  135 - 145 mmol/L Final   Potassium 12/06/2020 4.2  3.5 - 5.1 mmol/L Final   Chloride 12/06/2020 102  98 - 111 mmol/L Final   CO2 12/06/2020 23  22 - 32 mmol/L Final   Glucose, Bld 12/06/2020 330 (A) 70 - 99 mg/dL Final   BUN 12/06/2020 33 (A) 6 - 20 mg/dL Final   Creatinine, Ser 12/06/2020 1.61 (A) 0.61 - 1.24 mg/dL Final   Calcium 12/06/2020 8.9  8.9 - 10.3 mg/dL Final   GFR, Estimated 12/06/2020 60 (A) >60 mL/min Final   Anion gap 12/06/2020 11  5 - 15 Final   WBC 12/06/2020 33.8 (A) 4.0 - 10.5 K/uL Final   RBC 12/06/2020 5.84 (A) 4.22 - 5.81 MIL/uL Final   Hemoglobin 12/06/2020 17.4 (A) 13.0 - 17.0 g/dL Final   HCT 12/06/2020 54.0 (A) 39.0 - 52.0 % Final   MCV 12/06/2020 92.5  80.0 - 100.0 fL Final   MCH 12/06/2020 29.8  26.0 - 34.0 pg Final   MCHC 12/06/2020 32.2  30.0 - 36.0 g/dL Final   RDW 12/06/2020 13.0  11.5 - 15.5 % Final   Platelets 12/06/2020 403 (A) 150 - 400 K/uL Final   nRBC 12/06/2020 0.0  0.0 - 0.2 % Final   Glucose-Capillary 12/05/2020 548 (A) 70 - 99 mg/dL Final   Comment 1 12/05/2020 Notify RN   Final   Hgb A1c MFr Bld 12/06/2020 10.5 (A) 4.8 - 5.6 % Final   Mean Plasma Glucose 12/06/2020 254.65  mg/dL Final   Glucose-Capillary 12/06/2020 119 (A) 70 - 99 mg/dL Final   Glucose-Capillary 12/06/2020 79  70 - 99 mg/dL Final  Glucose-Capillary 12/06/2020 98  70 - 99 mg/dL Final   Glucose-Capillary 12/06/2020 258 (A) 70 - 99 mg/dL Final   WBC 12/07/2020 13.7 (A) 4.0 - 10.5 K/uL Final   RBC 12/07/2020 4.83  4.22 - 5.81 MIL/uL Final   Hemoglobin 12/07/2020 14.4  13.0 - 17.0 g/dL Final   HCT 12/07/2020 45.3  39.0 - 52.0 % Final   MCV 12/07/2020 93.8  80.0 - 100.0 fL Final   MCH 12/07/2020 29.8  26.0 - 34.0 pg Final   MCHC 12/07/2020 31.8  30.0 - 36.0 g/dL Final   RDW 12/07/2020 13.1  11.5 - 15.5 % Final   Platelets 12/07/2020 306  150 - 400 K/uL Final   nRBC 12/07/2020 0.0  0.0 - 0.2 % Final   Glucose-Capillary 12/06/2020 253 (A) 70 - 99  mg/dL Final   Comment 1 12/06/2020 Notify RN   Final   Comment 2 12/06/2020 Document in Chart   Final   Glucose-Capillary 12/07/2020 155 (A) 70 - 99 mg/dL Final   Comment 1 12/07/2020 Notify RN   Final   Comment 2 12/07/2020 Document in Chart   Final   Glucose-Capillary 12/07/2020 54 (A) 70 - 99 mg/dL Final   Comment 1 12/07/2020 Notify RN   Final   Comment 2 12/07/2020 Document in Chart   Final   Glucose-Capillary 12/07/2020 124 (A) 70 - 99 mg/dL Final   Glucose-Capillary 12/07/2020 131 (A) 70 - 99 mg/dL Final   Glucose-Capillary 12/07/2020 108 (A) 70 - 99 mg/dL Final   Comment 1 12/07/2020 Notify RN   Final   Comment 2 12/07/2020 Document in Chart   Final   Glucose-Capillary 12/07/2020 156 (A) 70 - 99 mg/dL Final   Comment 1 12/07/2020 Notify RN   Final   Comment 2 12/07/2020 Document in Chart   Final   Glucose-Capillary 12/07/2020 178 (A) 70 - 99 mg/dL Final   Glucose-Capillary 12/08/2020 127 (A) 70 - 99 mg/dL Final   Glucose-Capillary 12/08/2020 101 (A) 70 - 99 mg/dL Final    Imaging Studies  DG CHEST PORT 1 VIEW  Result Date: 12/07/2020 CLINICAL DATA:  Follow-up RIGHT-sided pneumothorax EXAM: PORTABLE CHEST 1 VIEW COMPARISON:  12/07/2020 at 127 hours FINDINGS: Interval placement large-bore chest tube with tip at the RIGHT lung apex. Interval re-expansion of the RIGHT lung with no appreciable pneumothorax remaining. Small amount of subcutaneous gas along the RIGHT chest wall unchanged. IMPRESSION: Re-expansion of RIGHT lung following chest tube placement. No pneumothorax identified. Electronically Signed   By: Suzy Bouchard M.D.   On: 12/07/2020 16:38   DG CHEST PORT 1 VIEW  Result Date: 12/07/2020 CLINICAL DATA:  Follow-up right-sided pneumothorax. Right-sided chest tube. EXAM: PORTABLE CHEST 1 VIEW COMPARISON:  12/07/2020 at 6:17 a.m. FINDINGS: There has been a mild further increase in the size of the right-sided pneumothorax, again without midline shift to suggest tension. No  change in the position of the right inferior hemithorax chest tube. No change in the airspace opacity throughout most of the partly collapsed right lung. Left lung remains clear. IMPRESSION: 1. Mild additional increase in the size of the right-sided pneumothorax since the exam obtained this morning. No other change. Electronically Signed   By: Lajean Manes M.D.   On: 12/07/2020 13:55   Medications   Scheduled Meds:  insulin aspart  0-15 Units Subcutaneous Q4H   insulin glargine-yfgn  15 Units Subcutaneous QHS   risperiDONE  3 mg Oral Daily     LOS: 3 days   Time  spent: >78min  Richarda Osmond, DO [facility] 12/08/2020, 7:46 AM   To contact the [facility] Attending or Consulting provider for this patient: Check the care team in [facility] for a) attending/consulting [place] provider listed and b) the [facility] team listed Log into [email or web address] and use [place]'s universal password to access. If you do not have the password, please contact the hospital operator. Locate the [facility] provider you are looking for under [facility] and page to a number that you can be directly reached. If you still have difficulty reaching the provider, please page the [facility] (Director on Call) for the Hospitalists listed on amion for assistance.

## 2020-12-08 NOTE — Progress Notes (Addendum)
      [address]       [place] [address]             [phone]         Subjective: He is having pain around the chest tube site, otherwise no complaints.   Objective: Vital signs in last 24 hours: Temp:  [97.4 F (36.3 C)-98 F (36.7 C)] 97.4 F (36.3 C) (10/04 0732) Pulse Rate:  [78-97] 97 (10/04 0732) Cardiac Rhythm: Normal sinus rhythm (10/04 0828) Resp:  [14-18] 17 (10/04 0732) BP: (111-128)/(71-90) 114/81 (10/04 0732) SpO2:  [95 %-100 %] 95 % (10/04 0732)    Intake/Output from previous day: 10/03 0701 - 10/04 0700 In: 250 [P.O.:250] Out: 18 [Chest Tube:18] Intake/Output this shift: No intake/output data recorded.  General appearance: alert, cooperative, and no distress Heart: regular rate and rhythm, S1, S2 normal, no murmur, click, rub or gallop Lungs: clear to auscultation bilaterally Abdomen: soft, non-tender; bowel sounds normal; no masses,  no organomegaly Extremities: extremities normal, atraumatic, no cyanosis or edema Wound: clean and dry  Lab Results: Recent Labs    12/06/20 0110 12/07/20 0106  WBC 33.8* 13.7*  HGB 17.4* 14.4  HCT 54.0* 45.3  PLT 403* 306   BMET:  Recent Labs    12/05/20 2336 12/06/20 0110  NA 131* 136  K 5.0 4.2  CL 100 102  CO2 20* 23  GLUCOSE 564* 330*  BUN 34* 33*  CREATININE 1.76* 1.61*  CALCIUM 8.6* 8.9    PT/INR: No results for input(s): LABPROT, INR in the last 72 hours. ABG    Component Value Date/Time   PHART 7.336 (L) 04/30/2014 1215   HCO3 28.9 (H) 09/16/2018 1338   TCO2 11.3 04/30/2014 1215   ACIDBASEDEF 14.5 (H) 11/09/2016 1745   O2SAT 90.7 09/16/2018 1338   CBG (last 3)  Recent Labs    12/08/20 0014 12/08/20 0354 12/08/20 0744  GLUCAP 127* 101* 163*    Assessment/Plan: S/P chest tube placement yesterday  CV- NSR in the 90s, BP well controlled Pulm-chest tube in place on suction without an air leak he still had a right small pneumothorax on CXR. Renal-creatinine  1.61, holding Toradol for this reason. H and H 15.3/47.5, stable Endo- blood glucose well controlled Pain control-he is on Norco every 4 hours, hx of narcotic use. We added lidocaine patches but hesitant to add any additional  narcotics at this time.   Plan: Keep chest tube for now. He is having a lot of pain around the chest tube site, I have ordered a lidocaine patch for his discomfort. Dr. Kipp Brood discussed robotic-assisted bleb resection with the patient which will take place Thursday.     LOS: 3 days    Elgie Collard 12/08/2020   Chart reviewed, patient examined, agree with above. I don't see any air leak even with coughing. CXR stable with no ptx but some residual edema, atelectasis at the base. Will leave to suction today and if CXR ok and no air leak can change to water seal in am. Even if we can resolve this with chest tube only he is at high risk for recurrence.

## 2020-12-08 NOTE — Progress Notes (Signed)
Pt is ill appearing, lethargy, but fully oriented x 4, hemodynamically stable, afebrile. On 4 LPM of HFNCL, SPO2 97-100%, no distress. Right chest tube with -20 cmH2o suction has minimal air leak, right lateral upper chest has minimal area of subcutaneous emphysema around chest tube. Pain scale 7-8/10, tolerated well. Norco 7.5-325 mg given PRN q 4 hrs. We will monitor.  Kennyth Lose, RN

## 2020-12-09 ENCOUNTER — Inpatient Hospital Stay (HOSPITAL_COMMUNITY): Payer: BC Managed Care – PPO

## 2020-12-09 DIAGNOSIS — J9311 Primary spontaneous pneumothorax: Secondary | ICD-10-CM | POA: Diagnosis not present

## 2020-12-09 LAB — GLUCOSE, CAPILLARY
Glucose-Capillary: 212 mg/dL — ABNORMAL HIGH (ref 70–99)
Glucose-Capillary: 241 mg/dL — ABNORMAL HIGH (ref 70–99)
Glucose-Capillary: 310 mg/dL — ABNORMAL HIGH (ref 70–99)
Glucose-Capillary: 213 mg/dL — ABNORMAL HIGH (ref 70–99)

## 2020-12-09 LAB — CBC
HCT: 44.7 % (ref 39.0–52.0)
Hemoglobin: 14.3 g/dL (ref 13.0–17.0)
MCH: 29.4 pg (ref 26.0–34.0)
MCHC: 32 g/dL (ref 30.0–36.0)
MCV: 92 fL (ref 80.0–100.0)
Platelets: 322 10*3/uL (ref 150–400)
RBC: 4.86 MIL/uL (ref 4.22–5.81)
RDW: 12.1 % (ref 11.5–15.5)
WBC: 17.4 10*3/uL — ABNORMAL HIGH (ref 4.0–10.5)
nRBC: 0 % (ref 0.0–0.2)

## 2020-12-09 MED ORDER — INSULIN ASPART 100 UNIT/ML IJ SOLN
3.0000 [IU] | Freq: Three times a day (TID) | INTRAMUSCULAR | Status: DC
  Administered 2020-12-09 – 2020-12-11 (×3): 3 [IU] via SUBCUTANEOUS

## 2020-12-09 NOTE — Progress Notes (Signed)
PROGRESS NOTE    Pedro [name]  RJJ:[id number] DOB: 22-Sep-1995 DOA: 12/05/2020 PCP: Neale Burly, MD   Brief Narrative: Pedro [name] is a 25 y.o. male with a PMH significant for type 1 diabetes, polysubstance use. They presented from home to the urgent care and then ED on 12/05/2020 with chest pain and difficulty breathing, nausea, lightheadedness x several days. In the ED, it was found that they had right-sided spontaneous tension pneumothorax. They were treated with chest tube.  Patient was admitted to medicine service for further workup and management of pneumothorax as outlined in detail below.  Assessment & Plan:   Principal Problem:   Primary spontaneous pneumothorax Active Problems:   DM type 1 (diabetes mellitus, type 1) (HCC)   Hyperglycemia   Chest tube in place   Spontaneous R pneumothorax- On Chest tube in right chest.  CXR-No significant change from the most recent prior study. No evidence of a residual pneumothorax.  Stable right chest tube. Persistent right lung base opacity. CT surgery following and planning for or tomorrow.   Poorly controlled diabetes mellitus type 1 with hyperglycemia - hgb A1c 10.5 this admission.  Fasting glucose 163 this morning. -Continue semglee to 15u daily.  -Moderate sliding scale insulin with meals CBG (last 3)  Recent Labs    12/08/20 2334 12/09/20 0653 12/09/20 1227  GLUCAP 262* 212* 241*      Substance abuse history- Denies current drug use to admitting physician.  UDS positive for opiates, benzodiazepines, amphetamines, THC.    Continue home risperidone   Estimated body mass index is 18.83 kg/m as calculated from the following:   Height as of this encounter: 5\' 11"  (1.803 m).   Weight as of this encounter: 61.2 kg.  DVT prophylaxis: NONE Code Status: FULL Family Communication: NONE Disposition Plan:  Status is: Inpatient  Remains inpatient appropriate because:IV treatments appropriate due to intensity of  illness or inability to take PO  Dispo: The patient is from: Home              Anticipated d/c is to: Home              Patient currently is not medically stable to d/c.   Difficult to place patient NO   Consultants: CT  Procedures: CHEST TUBE  Antimicrobials:none  Subjective:  Resting in bed overnight no events Objective: Vitals:   12/08/20 2320 12/09/20 0347 12/09/20 0740 12/09/20 1133  BP: 127/88 124/83 (!) 135/99 132/81  Pulse: 96 88 100 97  Resp: 20 20 19 10   Temp: 97.9 F (36.6 C) 97.8 F (36.6 C) 97.9 F (36.6 C) 98.2 F (36.8 C)  TempSrc: Oral Oral Oral Oral  SpO2: 96% 97% 94% 93%  Weight:      Height:        Intake/Output Summary (Last 24 hours) at 12/09/2020 1606 Last data filed at 12/09/2020 0932 Gross per 24 hour  Intake 1167 ml  Output 420 ml  Net 747 ml   Filed Weights   12/05/20 1459 12/05/20 2250  Weight: 63.5 kg 61.2 kg    Examination:  General exam: Appears calm and comfortable  Respiratory system: Clear to auscultation. Respiratory effort normal.  Chest tube in place right thorax Cardiovascular system: S1 & S2 heard, RRR. No JVD, murmurs, rubs, gallops or clicks. No pedal edema. Gastrointestinal system: Abdomen is nondistended, soft and nontender. No organomegaly or masses felt. Normal bowel sounds heard. Central nervous system: Alert and oriented. No focal neurological deficits. Extremities: Symmetric  5 x 5 power. Skin: No rashes, lesions or ulcers Psychiatry: Judgement and insight appear normal. Mood & affect appropriate.     Data Reviewed: I have personally reviewed following labs and imaging studies  CBC: Recent Labs  Lab 12/05/20 2336 12/06/20 0110 12/07/20 0106 12/08/20 0802 12/09/20 0225  WBC 33.7* 33.8* 13.7* 8.9 17.4*  HGB 17.9* 17.4* 14.4 15.3 14.3  HCT 54.1* 54.0* 45.3 47.5 44.7  MCV 92.2 92.5 93.8 92.1 92.0  PLT 372 403* 306 310 557   Basic Metabolic Panel: Recent Labs  Lab 12/05/20 1511 12/05/20 2336  12/06/20 0110  NA 133* 131* 136  K 4.2 5.0 4.2  CL 98 100 102  CO2 24 20* 23  GLUCOSE 361* 564* 330*  BUN 27* 34* 33*  CREATININE 1.30* 1.76* 1.61*  CALCIUM 9.0 8.6* 8.9   GFR: Estimated Creatinine Clearance: 59.7 mL/min (A) (by C-G formula based on SCr of 1.61 mg/dL (H)). Liver Function Tests: No results for input(s): AST, ALT, ALKPHOS, BILITOT, PROT, ALBUMIN in the last 168 hours. No results for input(s): LIPASE, AMYLASE in the last 168 hours. No results for input(s): AMMONIA in the last 168 hours. Coagulation Profile: No results for input(s): INR, PROTIME in the last 168 hours. Cardiac Enzymes: No results for input(s): CKTOTAL, CKMB, CKMBINDEX, TROPONINI in the last 168 hours. BNP (last 3 results) No results for input(s): PROBNP in the last 8760 hours. HbA1C: No results for input(s): HGBA1C in the last 72 hours. CBG: Recent Labs  Lab 12/08/20 1647 12/08/20 2031 12/08/20 2334 12/09/20 0653 12/09/20 1227  GLUCAP 250* 303* 262* 212* 241*   Lipid Profile: No results for input(s): CHOL, HDL, LDLCALC, TRIG, CHOLHDL, LDLDIRECT in the last 72 hours. Thyroid Function Tests: No results for input(s): TSH, T4TOTAL, FREET4, T3FREE, THYROIDAB in the last 72 hours. Anemia Panel: No results for input(s): VITAMINB12, FOLATE, FERRITIN, TIBC, IRON, RETICCTPCT in the last 72 hours. Sepsis Labs: No results for input(s): PROCALCITON, LATICACIDVEN in the last 168 hours.  Recent Results (from the past 240 hour(s))  SARS CORONAVIRUS 2 (TAT 6-24 HRS) Nasopharyngeal Nasopharyngeal Swab     Status: None   Collection Time: 12/05/20  4:30 PM   Specimen: Nasopharyngeal Swab  Result Value Ref Range Status   SARS Coronavirus 2 NEGATIVE NEGATIVE Final    Comment: (NOTE) SARS-CoV-2 target nucleic acids are NOT DETECTED.  The SARS-CoV-2 RNA is generally detectable in upper and lower respiratory specimens during the acute phase of infection. Negative results do not preclude SARS-CoV-2 infection,  do not rule out co-infections with other pathogens, and should not be used as the sole basis for treatment or other patient management decisions. Negative results must be combined with clinical observations, patient history, and epidemiological information. The expected result is Negative.  Fact Sheet for Patients: SugarRoll.be  Fact Sheet for Healthcare Providers: https://www.woods-.com/  This test is not yet approved or cleared by the [place] FDA and  has been authorized for detection and/or diagnosis of SARS-CoV-2 by FDA under an Emergency Use Authorization (EUA). This EUA will remain  in effect (meaning this test can be used) for the duration of the COVID-19 declaration under Se ction 564(b)(1) of the Act, 21 U.S.C. section 360bbb-3(b)(1), unless the authorization is terminated or revoked sooner.  Performed at [facility], [place] [address], [place], [place] [address]          Radiology Studies: CT CHEST WO CONTRAST  Result Date: 12/09/2020 CLINICAL DATA:  Pneumothorax. Assess for resolution of edema  prior to planned surgery tomorrow. EXAM: CT CHEST WITHOUT CONTRAST TECHNIQUE: Multidetector CT imaging of the chest was performed following the standard protocol without IV contrast. COMPARISON:  12/05/2020 FINDINGS: Cardiovascular: Normal in size and configuration. No pericardial effusion. Great vessels are unremarkable. Mediastinum/Nodes: No enlarged mediastinal or axillary lymph nodes. Thyroid gland, trachea, and esophagus demonstrate no significant findings. Lungs/Pleura: Moderate, proximally 50%, right-sided pneumothorax, air collecting anteriorly. Middle and superior mediastinal structures are midline. Heart is mildly deviated to the left. Pneumothorax not identified on the current portable chest radiograph. Right chest tube has its tip in the posterior right upper hemithorax. Small areas of ground-glass opacity noted in  the right lung, with more confluent type opacity noted at the base of the right middle lobe and in the right lower lobe. This represents significant improvement of the more diffuse ground-glass opacities noted on the CT from 12/05/2020. Mild atelectasis at the posterior base of the left lower lobe. Left lung otherwise clear. There is bilateral paraseptal fizzy mobile with more extensive blebs at the right apex. No pleural effusion.  No left pneumothorax. Upper Abdomen: Unremarkable. Musculoskeletal: Unremarkable. IMPRESSION: 1. Moderate, approximally 50%, right pneumothorax. This lies predominantly anteriorly, and was not visualized on the current portable chest radiograph. The heart is mildly deviated to the left. 2. Well position right chest tube, tip in the posterior right upper hemithorax. 3. Patchy areas of ground-glass opacity are noted in the right mid to lower lung with more confluent opacity closer to the lung base. The more confluent opacity is likely atelectasis. This represents significant improvement in the more extensive ground-glass opacities noted throughout the right lung on the prior CT. 4. Significant apical blebs on the right with milder paraseptal emphysema at the left apex. Emphysema (ICD10-J43.9). Electronically Signed   By: Lajean Manes M.D.   On: 12/09/2020 13:33   DG CHEST PORT 1 VIEW  Result Date: 12/09/2020 CLINICAL DATA:  Follow-up for right pneumothorax and right chest tube. EXAM: PORTABLE CHEST 1 VIEW COMPARISON:  12/08/2020 and older studies. FINDINGS: No visualized pneumothorax. Right chest tube is stable. Patchy opacity at the right lung base is also without significant change compared to the previous day's study. Remainder of the right lung is clear. Clear left lung. Right lateral chest wall subcutaneous sent emphysema is stable. IMPRESSION: 1. No significant change from the most recent prior study. 2. No evidence of a residual pneumothorax.  Stable right chest tube. 3.  Persistent right lung base opacity. Electronically Signed   By: Lajean Manes M.D.   On: 12/09/2020 13:17   DG CHEST PORT 1 VIEW  Result Date: 12/08/2020 CLINICAL DATA:  Follow-up pneumothorax. EXAM: PORTABLE CHEST 1 VIEW COMPARISON:  12/07/2020 at 4 p.m. FINDINGS: No residual right pneumothorax. Right chest tube is stable. There is opacity at the right lung base, which appears increased from the most recent prior exam. Remainder of the lungs is clear. No convincing pleural effusion.  No left pneumothorax. Right lateral chest wall subcutaneous emphysema is stable. IMPRESSION: 1. Stable right-sided chest tube with no evidence of a residual pneumothorax. 2. Airspace opacity at the right lung base fat may reflect edema or atelectasis, not evident on the most recent prior chest radiograph. Overall, right lung opacities have significantly improved compared to the chest CT dated 12/05/2020. Electronically Signed   By: Lajean Manes M.D.   On: 12/08/2020 10:18   DG CHEST PORT 1 VIEW  Result Date: 12/07/2020 CLINICAL DATA:  Follow-up RIGHT-sided pneumothorax EXAM: PORTABLE CHEST 1 VIEW  COMPARISON:  12/07/2020 at 127 hours FINDINGS: Interval placement large-bore chest tube with tip at the RIGHT lung apex. Interval re-expansion of the RIGHT lung with no appreciable pneumothorax remaining. Small amount of subcutaneous gas along the RIGHT chest wall unchanged. IMPRESSION: Re-expansion of RIGHT lung following chest tube placement. No pneumothorax identified. Electronically Signed   By: Suzy Bouchard M.D.   On: 12/07/2020 16:38        Scheduled Meds:  insulin aspart  0-15 Units Subcutaneous TID WC   insulin glargine-yfgn  15 Units Subcutaneous QHS   lidocaine  1 patch Transdermal Q24H   risperiDONE  3 mg Oral Daily   Continuous Infusions:  cefTRIAXone (ROCEPHIN)  IV 1 g (12/08/20 1529)     LOS: 4 days    Time spent: 58 min    Georgette Shell, MD 12/09/2020, 4:06 PM

## 2020-12-09 NOTE — Progress Notes (Addendum)
Inpatient Diabetes Program Recommendations  AACE/ADA: New Consensus Statement on Inpatient Glycemic Control (2015)  Target Ranges:  Prepandial:   less than 140 mg/dL      Peak postprandial:   less than 180 mg/dL (1-2 hours)      Critically ill patients:  140 - 180 mg/dL   Lab Results  Component Value Date   GLUCAP 241 (H) 12/09/2020   HGBA1C 10.5 (H) 12/06/2020    Review of Glycemic Control  Diabetes history: type 1 Outpatient Diabetes medications: 70/30 insulin 40 units BID Current orders for Inpatient glycemic control: Semglee 15 units at HS, Novolog MODERATE correction scale TID  Inpatient Diabetes Program Recommendations:   Noted that blood sugars continue to be greater than 180 mg/dl.  Recommend adding Novolog 3 units TID with meals if patient eats at least 50% of meal and if blood sugars continue to be elevated. Recommend adding Novolog 0-5 units HS scale if blood sugars continue to be elevated.    ADDENDUM: spoke briefly with patient at the bedside. Patient in pain. States that he takes 70/30 insulin 40 units BID. He does not always check blood sugars. Was not aware of his hgbA1C of 10.5%. Will be trying to find a new PCP. Will continue to follow while in the hospital.   Harvel Ricks RN BSN CDE Diabetes Coordinator Pager: [phone]  8am-5pm

## 2020-12-09 NOTE — Progress Notes (Signed)
Pt slept well tonight. Pain was managed, well tolerated. We alternated with Morphine, tylenol, Norco and Lidocaine patch. His vital signs remained stable. On 3LPM of O2 NCL, SPO2 97-98%. . Chest tube has minimal bubble from air leak. Total marked out put  20 ml in 24 hours with small serosanguinous drainage. No acute distress noted. We will monitor.  Kennyth Lose, RN

## 2020-12-09 NOTE — Progress Notes (Addendum)
      [address]       [place] [address]             [phone]         Subjective: Pain control is better and he rested better last night.  Sitting up eating breakfast. No new concerns.   Objective: Vital signs in last 24 hours: Temp:  [97.5 F (36.4 C)-98.1 F (36.7 C)] 97.8 F (36.6 C) (10/05 0347) Pulse Rate:  [88-118] 88 (10/05 0347) Cardiac Rhythm: Normal sinus rhythm (10/05 0306) Resp:  [15-24] 20 (10/05 0347) BP: (124-142)/(82-88) 124/83 (10/05 0347) SpO2:  [94 %-97 %] 97 % (10/05 0347)    Intake/Output from previous day: 10/04 0701 - 10/05 0700 In: 450 [P.O.:250; IV Piggyback:200] Out: 670 [Urine:650; Chest Tube:20] Intake/Output this shift: No intake/output data recorded.  General appearance: alert, cooperative, and no distress Heart: regular rate and rhythm, Lungs: breath sounds clear, no distress. Active air leak. CXR pending.  Wound: CT dressing is dry, connections secure.  Lab Results: Recent Labs    12/08/20 0802 12/09/20 0225  WBC 8.9 17.4*  HGB 15.3 14.3  HCT 47.5 44.7  PLT 310 322    BMET:  No results for input(s): NA, K, CL, CO2, GLUCOSE, BUN, CREATININE, CALCIUM in the last 72 hours.   PT/INR: No results for input(s): LABPROT, INR in the last 72 hours. ABG    Component Value Date/Time   PHART 7.336 (L) 04/30/2014 1215   HCO3 28.9 (H) 09/16/2018 1338   TCO2 11.3 04/30/2014 1215   ACIDBASEDEF 14.5 (H) 11/09/2016 1745   O2SAT 90.7 09/16/2018 1338   CBG (last 3)  Recent Labs    12/08/20 2031 12/08/20 2334 12/09/20 0653  GLUCAP 303* 262* 212*     Assessment/Plan: S/P chest tube placement yesterday  CV- NSR in the 90s, BP well controlled Pulm-chest tube in place on suction with an active air leak. Minimal drainage. CXR ordered for 0800 this morning.  Renal-mild renal insufficiency on admission. No new lab today.  H and H 14/45,  stable Endo- H/O type 1 DM. Blood glucose 200-300 past 24 hours. On Semglee  and sliding scale insulin.  Pain control-improved. No change in mgt.   Plan: Keep chest tube on suction for now. Dr. Kipp Brood discussed robotic-assisted bleb resection. Surgery planned for tomorrow.     LOS: 4 days    Pedro Odea, PA-C [phone] 12/09/2020  OR tomorrow for R RATS, wedge resection, mechanical pleurodesis, apical pleurectomy  Pedro [name] O Pedro [name]

## 2020-12-10 ENCOUNTER — Encounter (HOSPITAL_COMMUNITY)
Admission: EM | Disposition: A | Discharge status: Home / Self Care | Payer: Self-pay | Source: Home / Self Care | Attending: Internal Medicine

## 2020-12-10 ENCOUNTER — Inpatient Hospital Stay (HOSPITAL_COMMUNITY): Payer: BC Managed Care – PPO

## 2020-12-10 ENCOUNTER — Inpatient Hospital Stay (HOSPITAL_COMMUNITY): Payer: BC Managed Care – PPO | Admitting: Certified Registered Nurse Anesthetist

## 2020-12-10 ENCOUNTER — Encounter (HOSPITAL_COMMUNITY): Payer: Self-pay | Admitting: Internal Medicine

## 2020-12-10 DIAGNOSIS — J9311 Primary spontaneous pneumothorax: Secondary | ICD-10-CM | POA: Diagnosis not present

## 2020-12-10 HISTORY — PX: PLEURADESIS: SHX6030

## 2020-12-10 HISTORY — PX: PLEURECTOMY: SHX5081

## 2020-12-10 HISTORY — PX: INTERCOSTAL NERVE BLOCK: SHX5021

## 2020-12-10 LAB — SURGICAL PCR SCREEN
MRSA, PCR: NEGATIVE
Staphylococcus aureus: POSITIVE — AB

## 2020-12-10 LAB — TYPE AND SCREEN
ABO/RH(D): A POS
Antibody Screen: NEGATIVE

## 2020-12-10 LAB — ABO/RH: ABO/RH(D): A POS

## 2020-12-10 LAB — GLUCOSE, CAPILLARY
Glucose-Capillary: 188 mg/dL — ABNORMAL HIGH (ref 70–99)
Glucose-Capillary: 196 mg/dL — ABNORMAL HIGH (ref 70–99)
Glucose-Capillary: 271 mg/dL — ABNORMAL HIGH (ref 70–99)
Glucose-Capillary: 283 mg/dL — ABNORMAL HIGH (ref 70–99)
Glucose-Capillary: 288 mg/dL — ABNORMAL HIGH (ref 70–99)
Glucose-Capillary: 292 mg/dL — ABNORMAL HIGH (ref 70–99)
Glucose-Capillary: 298 mg/dL — ABNORMAL HIGH (ref 70–99)

## 2020-12-10 SURGERY — WEDGE RESECTION, LUNG, ROBOT-ASSISTED, THORACOSCOPIC
Anesthesia: General | Site: Chest | Laterality: Right

## 2020-12-10 MED ORDER — MIDAZOLAM HCL 2 MG/2ML IJ SOLN
INTRAMUSCULAR | Status: AC
  Filled 2020-12-10: qty 2

## 2020-12-10 MED ORDER — FENTANYL CITRATE (PF) 100 MCG/2ML IJ SOLN
INTRAMUSCULAR | Status: AC
  Filled 2020-12-10: qty 2

## 2020-12-10 MED ORDER — KETOROLAC TROMETHAMINE 15 MG/ML IJ SOLN
15.0000 mg | Freq: Four times a day (QID) | INTRAMUSCULAR | Status: AC
  Administered 2020-12-11 – 2020-12-12 (×8): 15 mg via INTRAVENOUS
  Filled 2020-12-10 (×8): qty 1

## 2020-12-10 MED ORDER — ACETAMINOPHEN 325 MG PO TABS: 325.0000 mg | ORAL_TABLET | ORAL | Status: DC | PRN

## 2020-12-10 MED ORDER — PROMETHAZINE HCL 25 MG/ML IJ SOLN: 6.2500 mg | INTRAMUSCULAR | Status: DC | PRN

## 2020-12-10 MED ORDER — LACTATED RINGERS IV SOLN: INTRAVENOUS | Status: DC

## 2020-12-10 MED ORDER — ONDANSETRON HCL 4 MG/2ML IJ SOLN: 4.0000 mg | Freq: Four times a day (QID) | INTRAMUSCULAR | Status: DC | PRN

## 2020-12-10 MED ORDER — CEFAZOLIN SODIUM-DEXTROSE 2-4 GM/100ML-% IV SOLN
2.0000 g | Freq: Once | INTRAVENOUS | Status: AC
  Administered 2020-12-10: 2 g via INTRAVENOUS

## 2020-12-10 MED ORDER — ACETAMINOPHEN 160 MG/5ML PO SOLN: 325.0000 mg | ORAL | Status: DC | PRN

## 2020-12-10 MED ORDER — FENTANYL CITRATE (PF) 100 MCG/2ML IJ SOLN
25.0000 ug | INTRAMUSCULAR | Status: DC | PRN
  Administered 2020-12-10 (×3): 50 ug via INTRAVENOUS

## 2020-12-10 MED ORDER — ONDANSETRON HCL 4 MG/2ML IJ SOLN
INTRAMUSCULAR | Status: DC | PRN
  Administered 2020-12-10: 4 mg via INTRAVENOUS

## 2020-12-10 MED ORDER — ONDANSETRON HCL 4 MG/2ML IJ SOLN
INTRAMUSCULAR | Status: AC
  Filled 2020-12-10: qty 4

## 2020-12-10 MED ORDER — ACETAMINOPHEN 160 MG/5ML PO SOLN: 1000.0000 mg | Freq: Four times a day (QID) | ORAL | Status: AC

## 2020-12-10 MED ORDER — MUPIROCIN 2 % EX OINT
1.0000 | TOPICAL_OINTMENT | Freq: Two times a day (BID) | CUTANEOUS | Status: AC
Start: 2020-12-10 — End: 2020-12-15
  Administered 2020-12-10 – 2020-12-15 (×10): 1 via NASAL
  Filled 2020-12-10: qty 22

## 2020-12-10 MED ORDER — SENNOSIDES-DOCUSATE SODIUM 8.6-50 MG PO TABS
1.0000 | ORAL_TABLET | Freq: Every day | ORAL | Status: DC
  Administered 2020-12-12 – 2020-12-21 (×9): 1 via ORAL
  Filled 2020-12-10 (×11): qty 1

## 2020-12-10 MED ORDER — CHLORHEXIDINE GLUCONATE CLOTH 2 % EX PADS
6.0000 | MEDICATED_PAD | Freq: Every day | CUTANEOUS | Status: AC
  Administered 2020-12-11 – 2020-12-15 (×4): 6 via TOPICAL

## 2020-12-10 MED ORDER — INSULIN ASPART 100 UNIT/ML IJ SOLN
8.0000 [IU] | Freq: Once | INTRAMUSCULAR | Status: AC
  Administered 2020-12-10: 8 [IU] via SUBCUTANEOUS

## 2020-12-10 MED ORDER — BISACODYL 5 MG PO TBEC
10.0000 mg | DELAYED_RELEASE_TABLET | Freq: Every day | ORAL | Status: DC
  Administered 2020-12-12 – 2020-12-22 (×9): 10 mg via ORAL
  Filled 2020-12-10 (×11): qty 2

## 2020-12-10 MED ORDER — CHLORHEXIDINE GLUCONATE 0.12 % MT SOLN
15.0000 mL | Freq: Once | OROMUCOSAL | Status: AC
  Administered 2020-12-10: 15 mL via OROMUCOSAL
  Filled 2020-12-10: qty 15

## 2020-12-10 MED ORDER — OXYCODONE HCL 5 MG/5ML PO SOLN: 5.0000 mg | Freq: Once | ORAL | Status: DC | PRN

## 2020-12-10 MED ORDER — ACETAMINOPHEN 10 MG/ML IV SOLN
1000.0000 mg | Freq: Once | INTRAVENOUS | Status: DC | PRN
  Administered 2020-12-10: 1000 mg via INTRAVENOUS

## 2020-12-10 MED ORDER — ACETAMINOPHEN 500 MG PO TABS
1000.0000 mg | ORAL_TABLET | Freq: Four times a day (QID) | ORAL | Status: AC
  Administered 2020-12-11 – 2020-12-15 (×18): 1000 mg via ORAL
  Filled 2020-12-10 (×19): qty 2

## 2020-12-10 MED ORDER — ENOXAPARIN SODIUM 40 MG/0.4ML IJ SOSY
40.0000 mg | PREFILLED_SYRINGE | Freq: Every day | INTRAMUSCULAR | Status: DC
  Administered 2020-12-12 – 2020-12-19 (×7): 40 mg via SUBCUTANEOUS
  Filled 2020-12-10 (×13): qty 0.4

## 2020-12-10 MED ORDER — KETOROLAC TROMETHAMINE 30 MG/ML IJ SOLN
INTRAMUSCULAR | Status: AC
  Filled 2020-12-10: qty 2

## 2020-12-10 MED ORDER — SUGAMMADEX SODIUM 200 MG/2ML IV SOLN
INTRAVENOUS | Status: DC | PRN
Start: 2020-12-10 — End: 2020-12-10
  Administered 2020-12-10: 200 mg via INTRAVENOUS

## 2020-12-10 MED ORDER — KETOROLAC TROMETHAMINE 30 MG/ML IJ SOLN
INTRAMUSCULAR | Status: DC | PRN
  Administered 2020-12-10: 30 mg via INTRAVENOUS

## 2020-12-10 MED ORDER — 0.9 % SODIUM CHLORIDE (POUR BTL) OPTIME
TOPICAL | Status: DC | PRN
  Administered 2020-12-10: 2000 mL

## 2020-12-10 MED ORDER — LIDOCAINE 2% (20 MG/ML) 5 ML SYRINGE
INTRAMUSCULAR | Status: DC | PRN
  Administered 2020-12-10: 40 mg via INTRAVENOUS

## 2020-12-10 MED ORDER — LIDOCAINE 2% (20 MG/ML) 5 ML SYRINGE
INTRAMUSCULAR | Status: AC
  Filled 2020-12-10: qty 10

## 2020-12-10 MED ORDER — MORPHINE SULFATE (PF) 2 MG/ML IV SOLN
2.0000 mg | INTRAVENOUS | Status: DC | PRN
Start: 2020-12-10 — End: 2020-12-22
  Administered 2020-12-10 – 2020-12-15 (×12): 2 mg via INTRAVENOUS
  Filled 2020-12-10 (×13): qty 1

## 2020-12-10 MED ORDER — DEXAMETHASONE SODIUM PHOSPHATE 10 MG/ML IJ SOLN
INTRAMUSCULAR | Status: AC
  Filled 2020-12-10: qty 2

## 2020-12-10 MED ORDER — ORAL CARE MOUTH RINSE: 15.0000 mL | Freq: Once | OROMUCOSAL | Status: AC

## 2020-12-10 MED ORDER — BUPIVACAINE HCL (PF) 0.5 % IJ SOLN
INTRAMUSCULAR | Status: AC
  Filled 2020-12-10: qty 30

## 2020-12-10 MED ORDER — MIDAZOLAM HCL 2 MG/2ML IJ SOLN
INTRAMUSCULAR | Status: DC | PRN
  Administered 2020-12-10 (×2): 1 mg via INTRAVENOUS

## 2020-12-10 MED ORDER — BUPIVACAINE LIPOSOME 1.3 % IJ SUSP
INTRAMUSCULAR | Status: AC
  Filled 2020-12-10: qty 20

## 2020-12-10 MED ORDER — PROPOFOL 10 MG/ML IV BOLUS
INTRAVENOUS | Status: DC | PRN
  Administered 2020-12-10: 200 mg via INTRAVENOUS

## 2020-12-10 MED ORDER — FENTANYL CITRATE (PF) 250 MCG/5ML IJ SOLN
INTRAMUSCULAR | Status: AC
  Filled 2020-12-10: qty 5

## 2020-12-10 MED ORDER — AMISULPRIDE (ANTIEMETIC) 5 MG/2ML IV SOLN: 10.0000 mg | Freq: Once | INTRAVENOUS | Status: DC | PRN

## 2020-12-10 MED ORDER — FENTANYL CITRATE (PF) 250 MCG/5ML IJ SOLN
INTRAMUSCULAR | Status: DC | PRN
  Administered 2020-12-10: 100 ug via INTRAVENOUS
  Administered 2020-12-10: 50 ug via INTRAVENOUS

## 2020-12-10 MED ORDER — SODIUM CHLORIDE FLUSH 0.9 % IV SOLN
INTRAVENOUS | Status: DC | PRN
  Administered 2020-12-10: 100 mL

## 2020-12-10 MED ORDER — ACETAMINOPHEN 10 MG/ML IV SOLN
INTRAVENOUS | Status: AC
  Filled 2020-12-10: qty 100

## 2020-12-10 MED ORDER — PROPOFOL 10 MG/ML IV BOLUS
INTRAVENOUS | Status: AC
  Filled 2020-12-10: qty 40

## 2020-12-10 MED ORDER — OXYCODONE HCL 5 MG PO TABS
5.0000 mg | ORAL_TABLET | ORAL | Status: DC | PRN
  Administered 2020-12-12 – 2020-12-22 (×39): 10 mg via ORAL
  Filled 2020-12-10 (×39): qty 2

## 2020-12-10 MED ORDER — ROCURONIUM BROMIDE 10 MG/ML (PF) SYRINGE
PREFILLED_SYRINGE | INTRAVENOUS | Status: DC | PRN
  Administered 2020-12-10: 60 mg via INTRAVENOUS
  Administered 2020-12-10 (×2): 20 mg via INTRAVENOUS

## 2020-12-10 MED ORDER — ROCURONIUM BROMIDE 10 MG/ML (PF) SYRINGE
PREFILLED_SYRINGE | INTRAVENOUS | Status: AC
  Filled 2020-12-10: qty 30

## 2020-12-10 MED ORDER — OXYCODONE HCL 5 MG PO TABS: 5.0000 mg | ORAL_TABLET | Freq: Once | ORAL | Status: DC | PRN

## 2020-12-10 MED ORDER — INSULIN ASPART 100 UNIT/ML IJ SOLN
0.0000 [IU] | INTRAMUSCULAR | Status: DC
  Administered 2020-12-11 (×2): 11 [IU] via SUBCUTANEOUS
  Administered 2020-12-11: 3 [IU] via SUBCUTANEOUS

## 2020-12-10 MED ORDER — CEFAZOLIN SODIUM-DEXTROSE 2-4 GM/100ML-% IV SOLN
INTRAVENOUS | Status: AC
  Filled 2020-12-10: qty 100

## 2020-12-10 MED ORDER — TRAMADOL HCL 50 MG PO TABS
50.0000 mg | ORAL_TABLET | Freq: Four times a day (QID) | ORAL | Status: DC | PRN
  Administered 2020-12-13 – 2020-12-20 (×5): 100 mg via ORAL
  Filled 2020-12-10 (×5): qty 2

## 2020-12-10 SURGICAL SUPPLY — 90 items
ADH SKN CLS APL DERMABOND .7 (GAUZE/BANDAGES/DRESSINGS) ×1
APL PRP STRL LF DISP 70% ISPRP (MISCELLANEOUS) ×1
BAG SPEC RTRVL C125 8X14 (MISCELLANEOUS) ×1
BAG TISS RTRVL C300 12X14 (MISCELLANEOUS) ×1
BLADE CLIPPER SURG (BLADE) ×2 IMPLANT
BNDG COHESIVE 6X5 TAN STRL LF (GAUZE/BANDAGES/DRESSINGS) ×2 IMPLANT
CANISTER SUCT 3000ML PPV (MISCELLANEOUS) ×4 IMPLANT
CANNULA REDUC XI 12-8 STAPL (CANNULA) ×4
CANNULA REDUCER 12-8 DVNC XI (CANNULA) ×2 IMPLANT
CATH THORACIC 28FR (CATHETERS) ×1 IMPLANT
CHLORAPREP W/TINT 26 (MISCELLANEOUS) ×2 IMPLANT
CLEANER TIP ELECTROSURG 2X2 (MISCELLANEOUS) ×1 IMPLANT
CNTNR URN SCR LID CUP LEK RST (MISCELLANEOUS) ×2 IMPLANT
CONN ST 1/4X3/8  BEN (MISCELLANEOUS)
CONN ST 1/4X3/8 BEN (MISCELLANEOUS) IMPLANT
CONT SPEC 4OZ STRL OR WHT (MISCELLANEOUS) ×12
DEFOGGER SCOPE WARMER CLEARIFY (MISCELLANEOUS) ×2 IMPLANT
DERMABOND ADVANCED (GAUZE/BANDAGES/DRESSINGS) ×1
DERMABOND ADVANCED .7 DNX12 (GAUZE/BANDAGES/DRESSINGS) ×1 IMPLANT
DRAPE ARM DVNC X/XI (DISPOSABLE) ×4 IMPLANT
DRAPE COLUMN DVNC XI (DISPOSABLE) ×1 IMPLANT
DRAPE CV SPLIT W-CLR ANES SCRN (DRAPES) ×2 IMPLANT
DRAPE DA VINCI XI ARM (DISPOSABLE) ×8
DRAPE DA VINCI XI COLUMN (DISPOSABLE) ×2
DRAPE HALF SHEET 40X57 (DRAPES) ×3 IMPLANT
DRAPE ORTHO SPLIT 77X108 STRL (DRAPES) ×2
DRAPE SURG ORHT 6 SPLT 77X108 (DRAPES) ×1 IMPLANT
ELECT BLADE 6.5 EXT (BLADE) IMPLANT
ELECT REM PT RETURN 9FT ADLT (ELECTROSURGICAL) ×2
ELECTRODE REM PT RTRN 9FT ADLT (ELECTROSURGICAL) ×1 IMPLANT
GAUZE KITTNER 4X5 RF (MISCELLANEOUS) ×3 IMPLANT
GAUZE SPONGE 4X4 12PLY STRL (GAUZE/BANDAGES/DRESSINGS) ×2 IMPLANT
GLOVE SURG ENC MOIS LTX SZ7.5 (GLOVE) ×4 IMPLANT
GOWN STRL REUS W/ TWL LRG LVL3 (GOWN DISPOSABLE) ×2 IMPLANT
GOWN STRL REUS W/ TWL XL LVL3 (GOWN DISPOSABLE) ×3 IMPLANT
GOWN STRL REUS W/TWL 2XL LVL3 (GOWN DISPOSABLE) ×2 IMPLANT
GOWN STRL REUS W/TWL LRG LVL3 (GOWN DISPOSABLE) ×4
GOWN STRL REUS W/TWL XL LVL3 (GOWN DISPOSABLE) ×6
HEMOSTAT SURGICEL 2X14 (HEMOSTASIS) ×6 IMPLANT
KIT BASIN OR (CUSTOM PROCEDURE TRAY) ×2 IMPLANT
KIT SUCTION CATH 14FR (SUCTIONS) IMPLANT
KIT TURNOVER KIT B (KITS) ×2 IMPLANT
NDL HYPO 25GX1X1/2 BEV (NEEDLE) ×1 IMPLANT
NEEDLE 22X1 1/2 (OR ONLY) (NEEDLE) ×2 IMPLANT
NEEDLE HYPO 25GX1X1/2 BEV (NEEDLE) ×2 IMPLANT
NS IRRIG 1000ML POUR BTL (IV SOLUTION) ×6 IMPLANT
PACK CHEST (CUSTOM PROCEDURE TRAY) ×2 IMPLANT
PAD ARMBOARD 7.5X6 YLW CONV (MISCELLANEOUS) ×10 IMPLANT
PORT ACCESS TROCAR AIRSEAL 12 (TROCAR) ×1 IMPLANT
PORT ACCESS TROCAR AIRSEAL 5M (TROCAR) ×1
RELOAD STAPLE 45 3.5 BLU DVNC (STAPLE) IMPLANT
RELOAD STAPLE 45 4.3 GRN DVNC (STAPLE) IMPLANT
RELOAD STAPLE 45 4.6 BLK DVNC (STAPLE) IMPLANT
RELOAD STAPLER 3.5X45 BLU DVNC (STAPLE) ×2 IMPLANT
RELOAD STAPLER 4.3X45 GRN DVNC (STAPLE) ×2 IMPLANT
RELOAD STAPLER 45 4.6 BLK DVNC (STAPLE) ×4 IMPLANT
SEAL CANN UNIV 5-8 DVNC XI (MISCELLANEOUS) ×2 IMPLANT
SEAL XI 5MM-8MM UNIVERSAL (MISCELLANEOUS) ×4
SET TRI-LUMEN FLTR TB AIRSEAL (TUBING) ×2 IMPLANT
SOLUTION ELECTROLUBE (MISCELLANEOUS) ×1 IMPLANT
SPONGE INTESTINAL PEANUT (DISPOSABLE) IMPLANT
STAPLER 45 DA VINCI SURE FORM (STAPLE) ×2
STAPLER 45 SUREFORM DVNC (STAPLE) IMPLANT
STAPLER CANNULA SEAL DVNC XI (STAPLE) ×2 IMPLANT
STAPLER CANNULA SEAL XI (STAPLE) ×4
STAPLER RELOAD 3.5X45 BLU DVNC (STAPLE) ×2
STAPLER RELOAD 3.5X45 BLUE (STAPLE) ×4
STAPLER RELOAD 4.3X45 GREEN (STAPLE) ×4
STAPLER RELOAD 4.3X45 GRN DVNC (STAPLE) ×2
STAPLER RELOAD 45 4.6 BLK (STAPLE) ×8
STAPLER RELOAD 45 4.6 BLK DVNC (STAPLE) ×4
STOPCOCK 4 WAY LG BORE MALE ST (IV SETS) ×2 IMPLANT
SUT SILK  1 MH (SUTURE) ×2
SUT SILK 1 MH (SUTURE) ×1 IMPLANT
SUT VIC AB 2-0 CT1 27 (SUTURE) ×2
SUT VIC AB 2-0 CT1 TAPERPNT 27 (SUTURE) ×1 IMPLANT
SUT VIC AB 3-0 SH 27 (SUTURE) ×6
SUT VIC AB 3-0 SH 27X BRD (SUTURE) ×3 IMPLANT
SUT VICRYL 0 TIES 12 18 (SUTURE) ×2 IMPLANT
SUT VICRYL 0 UR6 27IN ABS (SUTURE) ×4 IMPLANT
SYR 10ML LL (SYRINGE) ×2 IMPLANT
SYR 20ML LL LF (SYRINGE) ×2 IMPLANT
SYR 50ML LL SCALE MARK (SYRINGE) ×2 IMPLANT
SYSTEM RETRIEVAL ANCHOR 12 (MISCELLANEOUS) ×1 IMPLANT
SYSTEM RETRIEVAL ANCHOR 8 (MISCELLANEOUS) ×1 IMPLANT
SYSTEM SAHARA CHEST DRAIN ATS (WOUND CARE) ×2 IMPLANT
TAPE CLOTH 4X10 WHT NS (GAUZE/BANDAGES/DRESSINGS) ×2 IMPLANT
TOWEL GREEN STERILE (TOWEL DISPOSABLE) ×2 IMPLANT
TUBING EXTENTION W/L.L. (IV SETS) ×2 IMPLANT
WATER STERILE IRR 1000ML POUR (IV SOLUTION) ×2 IMPLANT

## 2020-12-10 NOTE — Transfer of Care (Signed)
Immediate Anesthesia Transfer of Care Note  Patient: Pedro [name]  Procedure(s) Performed: XI ROBOTIC ASSISTED THORASCOPY-WEDGE RESECTION (Right: Chest) APICAL PLEURECTOMY (Right: Chest) MECHANICAL PLEURADESIS (Right: Chest) INTERCOSTAL NERVE BLOCK (Right: Chest)  Patient Location: PACU  Anesthesia Type:General  Level of Consciousness: drowsy  Airway & Oxygen Therapy: Patient Spontanous Breathing and Patient connected to face mask oxygen  Post-op Assessment: Report given to RN and Post -op Vital signs reviewed and stable  Post vital signs: Reviewed and stable  Last Vitals:  Vitals Value Taken Time  BP 129/95 12/10/20 1802  Temp    Pulse 99 12/10/20 1804  Resp 19 12/10/20 1804  SpO2 100 % 12/10/20 1804  Vitals shown include unvalidated device data.  Last Pain:  Vitals:   12/10/20 1432  TempSrc: Oral  PainSc: 7       Patients Stated Pain Goal: 0 (06/38/68 5488)  Complications: No notable events documented.

## 2020-12-10 NOTE — Progress Notes (Signed)
PROGRESS NOTE    Pedro [name]  FIE:[id number] DOB: 1996/02/28 DOA: 12/05/2020 PCP: Neale Burly, MD   Brief Narrative: Pedro [name] is a 25 y.o. male with a PMH significant for type 1 diabetes, polysubstance use. They presented from home to the urgent care and then ED on 12/05/2020 with chest pain and difficulty breathing, nausea, lightheadedness x several days. In the ED, it was found that they had right-sided spontaneous tension pneumothorax. They were treated with chest tube.  Patient was admitted to medicine service for further workup and management of pneumothorax as outlined in detail below.  Assessment & Plan:   Principal Problem:   Primary spontaneous pneumothorax Active Problems:   DM type 1 (diabetes mellitus, type 1) (HCC)   Hyperglycemia   Chest tube in place   Spontaneous R pneumothorax- On Chest tube in right chest.  CXR-No significant change from the most recent prior study. No evidence of a residual pneumothorax.  Stable right chest tube. Persistent right lung base opacity. CT surgery following and planning for OR TODAY  Poorly controlled diabetes mellitus type 1 with hyperglycemia - hgb A1c 10.5 this admission.  Fasting glucose 163 this morning. -Continue semglee to 15u daily.  -Moderate sliding scale insulin with meals CBG (last 3)  Recent Labs    12/10/20 0749 12/10/20 1120 12/10/20 1435  GLUCAP 292* 196* 188*       Substance abuse history- Denies current drug use to admitting physician.  UDS positive for opiates, benzodiazepines, amphetamines, THC.    Continue home risperidone   Estimated body mass index is 18.83 kg/m as calculated from the following:   Height as of this encounter: 5\' 11"  (1.803 m).   Weight as of this encounter: 61.2 kg.  DVT prophylaxis: NONE Code Status: FULL Family Communication: NONE Disposition Plan:  Status is: Inpatient  Remains inpatient appropriate because:IV treatments appropriate due to intensity of  illness or inability to take PO  Dispo: The patient is from: Home              Anticipated d/c is to: Home              Patient currently is not medically stable to d/c.   Difficult to place patient NO   Consultants: CT  Procedures: CHEST TUBE  Antimicrobials:none  Subjective:  Resting in bed overnight no events Objective: Vitals:   12/10/20 0428 12/10/20 0747 12/10/20 1118 12/10/20 1432  BP: (!) 139/91 (!) 137/91 (!) 130/94 (!) 152/92  Pulse: 88 92 (!) 106 96  Resp: 20 19 18    Temp: 97.9 F (36.6 C) 97.8 F (36.6 C) 97.6 F (36.4 C) 97.7 F (36.5 C)  TempSrc: Oral Oral Oral Oral  SpO2: 93% 96% 98%   Weight:      Height:        Intake/Output Summary (Last 24 hours) at 12/10/2020 1628 Last data filed at 12/09/2020 2159 Gross per 24 hour  Intake 340 ml  Output 30 ml  Net 310 ml    Filed Weights   12/05/20 1459 12/05/20 2250  Weight: 63.5 kg 61.2 kg    Examination:  General exam: Appears calm and comfortable  Respiratory system: Clear to auscultation. Respiratory effort normal.  Chest tube in place right thorax Cardiovascular system: S1 & S2 heard, RRR. No JVD, murmurs, rubs, gallops or clicks. No pedal edema. Gastrointestinal system: Abdomen is nondistended, soft and nontender. No organomegaly or masses felt. Normal bowel sounds heard. Central nervous system: Alert and oriented. No  focal neurological deficits. Extremities: Symmetric 5 x 5 power. Skin: No rashes, lesions or ulcers Psychiatry: Judgement and insight appear normal. Mood & affect appropriate.     Data Reviewed: I have personally reviewed following labs and imaging studies  CBC: Recent Labs  Lab 12/05/20 2336 12/06/20 0110 12/07/20 0106 12/08/20 0802 12/09/20 0225  WBC 33.7* 33.8* 13.7* 8.9 17.4*  HGB 17.9* 17.4* 14.4 15.3 14.3  HCT 54.1* 54.0* 45.3 47.5 44.7  MCV 92.2 92.5 93.8 92.1 92.0  PLT 372 403* 306 310 732    Basic Metabolic Panel: Recent Labs  Lab 12/05/20 1511  12/05/20 2336 12/06/20 0110  NA 133* 131* 136  K 4.2 5.0 4.2  CL 98 100 102  CO2 24 20* 23  GLUCOSE 361* 564* 330*  BUN 27* 34* 33*  CREATININE 1.30* 1.76* 1.61*  CALCIUM 9.0 8.6* 8.9    GFR: Estimated Creatinine Clearance: 59.7 mL/min (A) (by C-G formula based on SCr of 1.61 mg/dL (H)). Liver Function Tests: No results for input(s): AST, ALT, ALKPHOS, BILITOT, PROT, ALBUMIN in the last 168 hours. No results for input(s): LIPASE, AMYLASE in the last 168 hours. No results for input(s): AMMONIA in the last 168 hours. Coagulation Profile: No results for input(s): INR, PROTIME in the last 168 hours. Cardiac Enzymes: No results for input(s): CKTOTAL, CKMB, CKMBINDEX, TROPONINI in the last 168 hours. BNP (last 3 results) No results for input(s): PROBNP in the last 8760 hours. HbA1C: No results for input(s): HGBA1C in the last 72 hours. CBG: Recent Labs  Lab 12/10/20 0012 12/10/20 0430 12/10/20 0749 12/10/20 1120 12/10/20 1435  GLUCAP 271* 283* 292* 196* 188*    Lipid Profile: No results for input(s): CHOL, HDL, LDLCALC, TRIG, CHOLHDL, LDLDIRECT in the last 72 hours. Thyroid Function Tests: No results for input(s): TSH, T4TOTAL, FREET4, T3FREE, THYROIDAB in the last 72 hours. Anemia Panel: No results for input(s): VITAMINB12, FOLATE, FERRITIN, TIBC, IRON, RETICCTPCT in the last 72 hours. Sepsis Labs: No results for input(s): PROCALCITON, LATICACIDVEN in the last 168 hours.  Recent Results (from the past 240 hour(s))  SARS CORONAVIRUS 2 (TAT 6-24 HRS) Nasopharyngeal Nasopharyngeal Swab     Status: None   Collection Time: 12/05/20  4:30 PM   Specimen: Nasopharyngeal Swab  Result Value Ref Range Status   SARS Coronavirus 2 NEGATIVE NEGATIVE Final    Comment: (NOTE) SARS-CoV-2 target nucleic acids are NOT DETECTED.  The SARS-CoV-2 RNA is generally detectable in upper and lower respiratory specimens during the acute phase of infection. Negative results do not preclude  SARS-CoV-2 infection, do not rule out co-infections with other pathogens, and should not be used as the sole basis for treatment or other patient management decisions. Negative results must be combined with clinical observations, patient history, and epidemiological information. The expected result is Negative.  Fact Sheet for Patients: SugarRoll.be  Fact Sheet for Healthcare Providers: https://www.woods-.com/  This test is not yet approved or cleared by the [place] FDA and  has been authorized for detection and/or diagnosis of SARS-CoV-2 by FDA under an Emergency Use Authorization (EUA). This EUA will remain  in effect (meaning this test can be used) for the duration of the COVID-19 declaration under Se ction 564(b)(1) of the Act, 21 U.S.C. section 360bbb-3(b)(1), unless the authorization is terminated or revoked sooner.  Performed at [facility], [place] [address], [place], [place] [address]           Radiology Studies: CT CHEST WO CONTRAST  Result Date: 12/09/2020  CLINICAL DATA:  Pneumothorax. Assess for resolution of edema prior to planned surgery tomorrow. EXAM: CT CHEST WITHOUT CONTRAST TECHNIQUE: Multidetector CT imaging of the chest was performed following the standard protocol without IV contrast. COMPARISON:  12/05/2020 FINDINGS: Cardiovascular: Normal in size and configuration. No pericardial effusion. Great vessels are unremarkable. Mediastinum/Nodes: No enlarged mediastinal or axillary lymph nodes. Thyroid gland, trachea, and esophagus demonstrate no significant findings. Lungs/Pleura: Moderate, proximally 50%, right-sided pneumothorax, air collecting anteriorly. Middle and superior mediastinal structures are midline. Heart is mildly deviated to the left. Pneumothorax not identified on the current portable chest radiograph. Right chest tube has its tip in the posterior right upper hemithorax. Small areas of  ground-glass opacity noted in the right lung, with more confluent type opacity noted at the base of the right middle lobe and in the right lower lobe. This represents significant improvement of the more diffuse ground-glass opacities noted on the CT from 12/05/2020. Mild atelectasis at the posterior base of the left lower lobe. Left lung otherwise clear. There is bilateral paraseptal fizzy mobile with more extensive blebs at the right apex. No pleural effusion.  No left pneumothorax. Upper Abdomen: Unremarkable. Musculoskeletal: Unremarkable. IMPRESSION: 1. Moderate, approximally 50%, right pneumothorax. This lies predominantly anteriorly, and was not visualized on the current portable chest radiograph. The heart is mildly deviated to the left. 2. Well position right chest tube, tip in the posterior right upper hemithorax. 3. Patchy areas of ground-glass opacity are noted in the right mid to lower lung with more confluent opacity closer to the lung base. The more confluent opacity is likely atelectasis. This represents significant improvement in the more extensive ground-glass opacities noted throughout the right lung on the prior CT. 4. Significant apical blebs on the right with milder paraseptal emphysema at the left apex. Emphysema (ICD10-J43.9). Electronically Signed   By: Lajean Manes M.D.   On: 12/09/2020 13:33   DG CHEST PORT 1 VIEW  Result Date: 12/09/2020 CLINICAL DATA:  Follow-up for right pneumothorax and right chest tube. EXAM: PORTABLE CHEST 1 VIEW COMPARISON:  12/08/2020 and older studies. FINDINGS: No visualized pneumothorax. Right chest tube is stable. Patchy opacity at the right lung base is also without significant change compared to the previous day's study. Remainder of the right lung is clear. Clear left lung. Right lateral chest wall subcutaneous sent emphysema is stable. IMPRESSION: 1. No significant change from the most recent prior study. 2. No evidence of a residual pneumothorax.   Stable right chest tube. 3. Persistent right lung base opacity. Electronically Signed   By: Lajean Manes M.D.   On: 12/09/2020 13:17        Scheduled Meds:  [MAR Hold] insulin aspart  0-15 Units Subcutaneous TID WC   [MAR Hold] insulin aspart  3 Units Subcutaneous TID WC   [MAR Hold] insulin glargine-yfgn  15 Units Subcutaneous QHS   [MAR Hold] lidocaine  1 patch Transdermal Q24H   [MAR Hold] risperiDONE  3 mg Oral Daily   Continuous Infusions:  ceFAZolin      ceFAZolin (ANCEF) IV     [MAR Hold] cefTRIAXone (ROCEPHIN)  IV Stopped (12/09/20 1718)   lactated ringers 10 mL/hr at 12/10/20 1458     LOS: 5 days    Time spent: 35 min    Georgette Shell, MD 12/10/2020, 4:28 PM

## 2020-12-10 NOTE — Progress Notes (Addendum)
      [address]       [place] [address]             [phone]      Day of Surgery Procedure(s) (LRB): XI ROBOTIC ASSISTED THORASCOPY-WEDGE RESECTION (Right) APICAL PLEURECTOMY (Right) MECHANICAL PLEURADESIS (Right) Subjective: C/O pain in his chest from the chest tube, otherwise no new concerns.  Sitting up eating breakfast. No new concerns.   Objective: Vital signs in last 24 hours: Temp:  [97.7 F (36.5 C)-98.4 F (36.9 C)] 97.8 F (36.6 C) (10/06 0747) Pulse Rate:  [80-100] 92 (10/06 0747) Cardiac Rhythm: Normal sinus rhythm (10/05 1900) Resp:  [10-20] 19 (10/06 0747) BP: (110-143)/(81-102) 137/91 (10/06 0747) SpO2:  [93 %-98 %] 96 % (10/06 0747)    Intake/Output from previous day: 10/05 0701 - 10/06 0700 In: 1057 [P.O.:957; IV Piggyback:100] Out: 930 [Urine:900; Chest Tube:30] Intake/Output this shift: No intake/output data recorded.  General appearance: alert, cooperative, and no distress Heart: regular rate and rhythm, Lungs: breath sounds clear, no distress. Active air leak.  Wound: CT dressing is dry, connections secure.  Lab Results: Recent Labs    12/08/20 0802 12/09/20 0225  WBC 8.9 17.4*  HGB 15.3 14.3  HCT 47.5 44.7  PLT 310 322    BMET:  No results for input(s): NA, K, CL, CO2, GLUCOSE, BUN, CREATININE, CALCIUM in the last 72 hours.   PT/INR: No results for input(s): LABPROT, INR in the last 72 hours. ABG    Component Value Date/Time   PHART 7.336 (L) 04/30/2014 1215   HCO3 28.9 (H) 09/16/2018 1338   TCO2 11.3 04/30/2014 1215   ACIDBASEDEF 14.5 (H) 11/09/2016 1745   O2SAT 90.7 09/16/2018 1338   CBG (last 3)  Recent Labs    12/09/20 2101 12/10/20 0012 12/10/20 0430  GLUCAP 213* 271* 283*     Assessment/Plan: S/P Procedure(s) (LRB): XI ROBOTIC ASSISTED THORASCOPY-WEDGE RESECTION (Right) APICAL PLEURECTOMY (Right) MECHANICAL PLEURADESIS (Right)chest tube placement yesterday  CV- NSR in the 90s, BP well  controlled Pulm-chest tube in place on suction with an active air leak. Minimal drainage. CT reviewed.   3.   H and H 14/45,  stable Endo- H/O type 1 DM. Blood glucose 200-300 past 24 hours. On Semglee and sliding scale insulin.  Plan robotic-assisted bleb resection later today by Dr. Kipp Brood.     LOS: 5 days    [name], PA-C [phone] 12/10/2020   OR today for R RATS, wedge resection, apical pleurectomy, mechanical pleurodsis  Marqus Macphee O Sherran Margolis

## 2020-12-10 NOTE — Anesthesia Preprocedure Evaluation (Addendum)
Anesthesia Evaluation  Patient identified by MRN, date of birth, ID band Patient awake    Reviewed: Allergy & Precautions, NPO status , Patient's Chart, lab work & pertinent test results  Airway Mallampati: II  TM Distance: >3 FB Neck ROM: Full    Dental  (+) Missing, Poor Dentition, Chipped, Loose, Dental Advisory Given,    Pulmonary Current Smoker and Patient abstained from smoking.,     + decreased breath sounds      Cardiovascular hypertension, Pt. on medications  Rhythm:Regular Rate:Normal     Neuro/Psych PSYCHIATRIC DISORDERS negative neurological ROS     GI/Hepatic negative GI ROS, Neg liver ROS,   Endo/Other  diabetes, Type 1, Insulin Dependent  Renal/GU Renal disease     Musculoskeletal   Abdominal   Peds  Hematology   Anesthesia Other Findings   Reproductive/Obstetrics                            Anesthesia Physical Anesthesia Plan  ASA: 2  Anesthesia Plan: General   Post-op Pain Management:    Induction: Intravenous  PONV Risk Score and Plan: 2 and Ondansetron and Midazolam  Airway Management Planned: Double Lumen EBT  Additional Equipment: None  Intra-op Plan:   Post-operative Plan: Extubation in OR  Informed Consent: I have reviewed the patients History and Physical, chart, labs and discussed the procedure including the risks, benefits and alternatives for the proposed anesthesia with the patient or authorized representative who has indicated his/her understanding and acceptance.     Dental advisory given  Plan Discussed with: CRNA  Anesthesia Plan Comments: (- Clearsight )       Anesthesia Quick Evaluation

## 2020-12-10 NOTE — Brief Op Note (Signed)
12/05/2020 - 12/10/2020  5:38 PM  PATIENT:  Pedro [name]  25 y.o. male  PRE-OPERATIVE DIAGNOSIS:  right pnuemothorax with airleak  POST-OPERATIVE DIAGNOSIS:  right pnuemothorax with airleak  PROCEDURES:   -XI ROBOTIC ASSISTED THORASCOPY -RIGHT UPPER LOBE WEDGE RESECTION -APICAL PLEURECTOMY (Right) -MECHANICAL PLEURADESIS (Right) -INTERCOSTAL NERVE BLOCK (Right)  SURGEON: Lajuana Matte, MD - Primary  PHYSICIAN ASSISTANT:  Arohi Salvatierra  ASSISTANTS: Romps, Sharlene Motts, Scrub Person   ANESTHESIA:   general  EBL: 68ml  BLOOD ADMINISTERED:none  DRAINS:  Right 51fr pleural Blake drain    LOCAL MEDICATIONS USED: Intercostal Exparel  SPECIMEN:  Right upper lobe wedge, right apical parietal pleura  DISPOSITION OF SPECIMEN:  PATHOLOGY  COUNTS:  YES  DICTATION: .Dragon Dictation  PLAN OF CARE: Admit to inpatient   PATIENT DISPOSITION:  PACU - hemodynamically stable.   Delay start of Pharmacological VTE agent (>24hrs) due to surgical blood loss or risk of bleeding: yes

## 2020-12-10 NOTE — Anesthesia Postprocedure Evaluation (Signed)
Anesthesia Post Note  Patient: Pedro [name]  Procedure(s) Performed: XI ROBOTIC ASSISTED THORASCOPY-WEDGE RESECTION (Right: Chest) APICAL PLEURECTOMY (Right: Chest) MECHANICAL PLEURADESIS (Right: Chest) INTERCOSTAL NERVE BLOCK (Right: Chest)     Patient location during evaluation: PACU Anesthesia Type: General Level of consciousness: sedated Pain management: pain level controlled Vital Signs Assessment: post-procedure vital signs reviewed and stable Respiratory status: spontaneous breathing and respiratory function stable Cardiovascular status: stable Postop Assessment: no apparent nausea or vomiting Anesthetic complications: no   No notable events documented.  Last Vitals:  Vitals:   12/10/20 1920 12/10/20 1930  BP: 135/89 (!) 131/94  Pulse: 92 95  Resp: 15 17  Temp:  (!) 36.3 C  SpO2: 99% 98%    Last Pain:  Vitals:   12/10/20 1930  TempSrc:   PainSc: Asleep                 Jacia Sickman DANIEL

## 2020-12-10 NOTE — Anesthesia Procedure Notes (Signed)
Procedure Name: Intubation Date/Time: 12/10/2020 4:22 PM Performed by: Carolan Clines, CRNA Pre-anesthesia Checklist: Patient identified, Emergency Drugs available, Suction available and Patient being monitored Patient Re-evaluated:Patient Re-evaluated prior to induction Oxygen Delivery Method: Circle System Utilized Preoxygenation: Pre-oxygenation with 100% oxygen Induction Type: IV induction Ventilation: Mask ventilation without difficulty Laryngoscope Size: Mac and 3 Grade View: Grade I Tube type: Oral Endobronchial tube: Left, Double lumen EBT, EBT position confirmed by fiberoptic bronchoscope and EBT position confirmed by auscultation and 37 Fr Number of attempts: 1 Airway Equipment and Method: Stylet and Oral airway Placement Confirmation: ETT inserted through vocal cords under direct vision, positive ETCO2 and breath sounds checked- equal and bilateral Tube secured with: Tape Dental Injury: Teeth and Oropharynx as per pre-operative assessment

## 2020-12-10 NOTE — Op Note (Signed)
      [address]       [place] [address]             [phone]        12/10/2020  Patient:  [name] Pre-Op Dx: spontaneous pneumothorax Bullous emphysema   Post-op Dx:  same Procedure:  - Robotic assisted right video thoracoscopy - Wedge resection of the right upper lobe - Apical pleurectomy - Mechanical pleurodesis - Intercostal nerve block  Surgeon and Role:      * Decklin Weddington, Lucile Crater, MD - Primary    * M. Roddenberry, PA-C - assisting  Anesthesia  general EBL:  24ml Blood Administration: none Specimen:  right upper lobe wedge  Drains: 63 F argyle chest tube in right chest Counts: correct   Indications: 25 yo male with prolonged air leak following admission for a spontaneous pneumothorax.  Cross sectional imaging revealed severe bullous emphysema.  Findings: RUL apex had several large bullae.    Operative Technique: After the risks, benefits and alternatives were thoroughly discussed, the patient was brought to the operative theatre.  Anesthesia was induced, and the patient was then placed in a left lateral decubitus position and was prepped and draped in normal sterile fashion.  An appropriate surgical pause was performed, and pre-operative antibiotics were dosed accordingly.  We began by placing our 3 robotic ports in the the 7th intercostal space targeting the hilum of the lung.  The robot was then docked and all instruments were passed under direct visualization.    The lung was then retracted superiorly, and the inferior pulmonary ligament was divided.  The lung was freed of all pleural adhesions.  Wedge resections of the right upper lobe was performed.  The apical parietal pleural was removed with a combination of cautery and blunt dissection.  The chest was irrigated, and an air leak test was performed.  An intercostal nerve block was performed under direct visualization.  A mechanical pleurodesis was then performed.  A 36F chest  with then placed, and we watch the remaining lobes re-expand.  The skin and soft tissue were closed with absorbable suture.    The patient tolerated the procedure without any immediate complications, and was transferred to the PACU in stable condition.  Michaela Shankel Bary Leriche

## 2020-12-11 ENCOUNTER — Inpatient Hospital Stay (HOSPITAL_COMMUNITY): Payer: BC Managed Care – PPO

## 2020-12-11 ENCOUNTER — Encounter (HOSPITAL_COMMUNITY): Payer: Self-pay | Admitting: Thoracic Surgery (Cardiothoracic Vascular Surgery)

## 2020-12-11 DIAGNOSIS — J9311 Primary spontaneous pneumothorax: Secondary | ICD-10-CM | POA: Diagnosis not present

## 2020-12-11 LAB — GLUCOSE, CAPILLARY
Glucose-Capillary: 163 mg/dL — ABNORMAL HIGH (ref 70–99)
Glucose-Capillary: 210 mg/dL — ABNORMAL HIGH (ref 70–99)
Glucose-Capillary: 210 mg/dL — ABNORMAL HIGH (ref 70–99)
Glucose-Capillary: 246 mg/dL — ABNORMAL HIGH (ref 70–99)
Glucose-Capillary: 275 mg/dL — ABNORMAL HIGH (ref 70–99)
Glucose-Capillary: 338 mg/dL — ABNORMAL HIGH (ref 70–99)
Glucose-Capillary: 350 mg/dL — ABNORMAL HIGH (ref 70–99)

## 2020-12-11 LAB — CBC
HCT: 45 % (ref 39.0–52.0)
Hemoglobin: 14.8 g/dL (ref 13.0–17.0)
MCH: 30.1 pg (ref 26.0–34.0)
MCHC: 32.9 g/dL (ref 30.0–36.0)
MCV: 91.6 fL (ref 80.0–100.0)
Platelets: 307 10*3/uL (ref 150–400)
RBC: 4.91 MIL/uL (ref 4.22–5.81)
RDW: 12.2 % (ref 11.5–15.5)
WBC: 17.5 10*3/uL — ABNORMAL HIGH (ref 4.0–10.5)
nRBC: 0 % (ref 0.0–0.2)

## 2020-12-11 LAB — BASIC METABOLIC PANEL
Anion gap: 11 (ref 5–15)
BUN: 28 mg/dL — ABNORMAL HIGH (ref 6–20)
CO2: 23 mmol/L (ref 22–32)
Calcium: 9 mg/dL (ref 8.9–10.3)
Chloride: 99 mmol/L (ref 98–111)
Creatinine, Ser: 1.12 mg/dL (ref 0.61–1.24)
GFR, Estimated: >60 mL/min (ref >60)
Glucose, Bld: 240 mg/dL — ABNORMAL HIGH (ref 70–99)
Potassium: 4.6 mmol/L (ref 3.5–5.1)
Sodium: 133 mmol/L — ABNORMAL LOW (ref 135–145)

## 2020-12-11 MED ORDER — INSULIN ASPART 100 UNIT/ML IJ SOLN
5.0000 [IU] | Freq: Three times a day (TID) | INTRAMUSCULAR | Status: DC
  Administered 2020-12-11 – 2020-12-22 (×24): 5 [IU] via SUBCUTANEOUS

## 2020-12-11 MED ORDER — INSULIN GLARGINE-YFGN 100 UNIT/ML ~~LOC~~ SOLN
25.0000 [IU] | Freq: Every day | SUBCUTANEOUS | Status: DC
  Administered 2020-12-11 – 2020-12-12 (×2): 25 [IU] via SUBCUTANEOUS
  Filled 2020-12-11 (×3): qty 0.25

## 2020-12-11 MED ORDER — INSULIN ASPART 100 UNIT/ML IJ SOLN
0.0000 [IU] | Freq: Every day | INTRAMUSCULAR | Status: DC
  Administered 2020-12-11: 3 [IU] via SUBCUTANEOUS
  Administered 2020-12-12 – 2020-12-14 (×3): 4 [IU] via SUBCUTANEOUS
  Administered 2020-12-15: 3 [IU] via SUBCUTANEOUS
  Administered 2020-12-18: 4 [IU] via SUBCUTANEOUS
  Administered 2020-12-19 – 2020-12-20 (×2): 3 [IU] via SUBCUTANEOUS
  Administered 2020-12-21: 2 [IU] via SUBCUTANEOUS

## 2020-12-11 MED ORDER — INSULIN ASPART 100 UNIT/ML IJ SOLN
0.0000 [IU] | Freq: Three times a day (TID) | INTRAMUSCULAR | Status: DC
  Administered 2020-12-11: 3 [IU] via SUBCUTANEOUS
  Administered 2020-12-12 (×2): 2 [IU] via SUBCUTANEOUS
  Administered 2020-12-12: 3 [IU] via SUBCUTANEOUS
  Administered 2020-12-13 (×2): 1 [IU] via SUBCUTANEOUS
  Administered 2020-12-13 – 2020-12-14 (×2): 3 [IU] via SUBCUTANEOUS
  Administered 2020-12-14: 5 [IU] via SUBCUTANEOUS
  Administered 2020-12-14 – 2020-12-15 (×3): 2 [IU] via SUBCUTANEOUS
  Administered 2020-12-15: 5 [IU] via SUBCUTANEOUS
  Administered 2020-12-16: 3 [IU] via SUBCUTANEOUS
  Administered 2020-12-16: 2 [IU] via SUBCUTANEOUS
  Administered 2020-12-17 (×2): 3 [IU] via SUBCUTANEOUS
  Administered 2020-12-18: 7 [IU] via SUBCUTANEOUS
  Administered 2020-12-18: 3 [IU] via SUBCUTANEOUS
  Administered 2020-12-19: 1 [IU] via SUBCUTANEOUS
  Administered 2020-12-19: 2 [IU] via SUBCUTANEOUS
  Administered 2020-12-20 (×2): 5 [IU] via SUBCUTANEOUS
  Administered 2020-12-20: 3 [IU] via SUBCUTANEOUS
  Administered 2020-12-21: 1 [IU] via SUBCUTANEOUS
  Administered 2020-12-21: 3 [IU] via SUBCUTANEOUS
  Administered 2020-12-22: 9 [IU] via SUBCUTANEOUS

## 2020-12-11 NOTE — Progress Notes (Signed)
PROGRESS NOTE    Pedro [name]  VVO:[id number] DOB: [birth year] DOA: 12/05/2020 PCP: Neale Burly, MD   Brief Narrative: Pedro [name] is a 25 y.o. male with a PMH significant for type 1 diabetes, polysubstance use. They presented from home to the urgent care and then ED on 12/05/2020 with chest pain and difficulty breathing, nausea, lightheadedness x several days. In the ED, it was found that they had right-sided spontaneous tension pneumothorax. They were treated with chest tube.  Patient was admitted to medicine service for further workup and management of pneumothorax as outlined in detail below.  Assessment & Plan:   Principal Problem:   Primary spontaneous pneumothorax Active Problems:   DM type 1 (diabetes mellitus, type 1) (HCC)   Hyperglycemia   Chest tube in place   Spontaneous R pneumothorax- On Chest tube in right chest. He is status post robotic assisted thoracoscopy with wedge resection and apical pleurectomy, mechanical pleurodesis and intercostal nerve block.  Poorly controlled diabetes mellitus type 1 with hyperglycemia - hgb A1c 10.5 this admission.  Fasting glucose 163 this morning. -Increase semglee to 25u daily.  -Increase NovoLog to 5 units 3 times a day before meals and continue SSI CBG (last 3)  Recent Labs    12/11/20 0410 12/11/20 0811 12/11/20 1222  GLUCAP 350* 338* 163*       Substance abuse history- Denies current drug use to admitting physician.  UDS positive for opiates, benzodiazepines, amphetamines, THC.    Continue home risperidone   Estimated body mass index is 18.83 kg/m as calculated from the following:   Height as of this encounter: 5\' 11"  (1.803 m).   Weight as of this encounter: 61.2 kg.  DVT prophylaxis: NONE Code Status: FULL Family Communication: NONE Disposition Plan:  Status is: Inpatient  Remains inpatient appropriate because:IV treatments appropriate due to intensity of illness or inability to take PO  Dispo:  The patient is from: Home              Anticipated d/c is to: Home              Patient currently is not medically stable to d/c.   Difficult to place patient NO   Consultants: CT  Procedures: CHEST TUBE Apical pleurectomy mechanical pleurodesis intercostal nerve block and robotic assisted thoracic [place] P with wedge resection on 12/10/2020.  Antimicrobials:none  Subjective:  Resting in bed he feels his pain is somewhat better compared to yesterday Objective: Vitals:   12/10/20 2317 12/11/20 0411 12/11/20 0811 12/11/20 1221  BP: 121/88 111/80 108/72 118/80  Pulse: 84 89 95 97  Resp: 11 14 18 16   Temp: 97.9 F (36.6 C) 98.2 F (36.8 C) 98 F (36.7 C)   TempSrc: Oral Oral Oral Oral  SpO2: 98% 98% 97% 99%  Weight:      Height:        Intake/Output Summary (Last 24 hours) at 12/11/2020 1439 Last data filed at 12/11/2020 0812 Gross per 24 hour  Intake 940 ml  Output 1070 ml  Net -130 ml    Filed Weights   12/05/20 1459 12/05/20 2250  Weight: 63.5 kg 61.2 kg    Examination:  General exam: Appears calm and comfortable  Respiratory system: Clear to auscultation. Respiratory effort normal.  Chest tube in place right thorax Cardiovascular system: S1 & S2 heard, RRR. No JVD, murmurs, rubs, gallops or clicks. No pedal edema. Gastrointestinal system: Abdomen is nondistended, soft and nontender. No organomegaly or masses felt. Normal  bowel sounds heard. Central nervous system: Alert and oriented. No focal neurological deficits. Extremities: Symmetric 5 x 5 power. Skin: No rashes, lesions or ulcers Psychiatry: Judgement and insight appear normal. Mood & affect appropriate.     Data Reviewed: I have personally reviewed following labs and imaging studies  CBC: Recent Labs  Lab 12/06/20 0110 12/07/20 0106 12/08/20 0802 12/09/20 0225 12/11/20 0147  WBC 33.8* 13.7* 8.9 17.4* 17.5*  HGB 17.4* 14.4 15.3 14.3 14.8  HCT 54.0* 45.3 47.5 44.7 45.0  MCV 92.5 93.8 92.1 92.0  91.6  PLT 403* 306 310 322 170    Basic Metabolic Panel: Recent Labs  Lab 12/05/20 1511 12/05/20 2336 12/06/20 0110 12/11/20 0147  NA 133* 131* 136 133*  K 4.2 5.0 4.2 4.6  CL 98 100 102 99  CO2 24 20* 23 23  GLUCOSE 361* 564* 330* 240*  BUN 27* 34* 33* 28*  CREATININE 1.30* 1.76* 1.61* 1.12  CALCIUM 9.0 8.6* 8.9 9.0    GFR: Estimated Creatinine Clearance: 85.8 mL/min (by C-G formula based on SCr of 1.12 mg/dL). Liver Function Tests: No results for input(s): AST, ALT, ALKPHOS, BILITOT, PROT, ALBUMIN in the last 168 hours. No results for input(s): LIPASE, AMYLASE in the last 168 hours. No results for input(s): AMMONIA in the last 168 hours. Coagulation Profile: No results for input(s): INR, PROTIME in the last 168 hours. Cardiac Enzymes: No results for input(s): CKTOTAL, CKMB, CKMBINDEX, TROPONINI in the last 168 hours. BNP (last 3 results) No results for input(s): PROBNP in the last 8760 hours. HbA1C: No results for input(s): HGBA1C in the last 72 hours. CBG: Recent Labs  Lab 12/10/20 2055 12/11/20 0000 12/11/20 0410 12/11/20 0811 12/11/20 1222  GLUCAP 298* 246* 350* 338* 163*    Lipid Profile: No results for input(s): CHOL, HDL, LDLCALC, TRIG, CHOLHDL, LDLDIRECT in the last 72 hours. Thyroid Function Tests: No results for input(s): TSH, T4TOTAL, FREET4, T3FREE, THYROIDAB in the last 72 hours. Anemia Panel: No results for input(s): VITAMINB12, FOLATE, FERRITIN, TIBC, IRON, RETICCTPCT in the last 72 hours. Sepsis Labs: No results for input(s): PROCALCITON, LATICACIDVEN in the last 168 hours.  Recent Results (from the past 240 hour(s))  SARS CORONAVIRUS 2 (TAT 6-24 HRS) Nasopharyngeal Nasopharyngeal Swab     Status: None   Collection Time: 12/05/20  4:30 PM   Specimen: Nasopharyngeal Swab  Result Value Ref Range Status   SARS Coronavirus 2 NEGATIVE NEGATIVE Final    Comment: (NOTE) SARS-CoV-2 target nucleic acids are NOT DETECTED.  The SARS-CoV-2 RNA is  generally detectable in upper and lower respiratory specimens during the acute phase of infection. Negative results do not preclude SARS-CoV-2 infection, do not rule out co-infections with other pathogens, and should not be used as the sole basis for treatment or other patient management decisions. Negative results must be combined with clinical observations, patient history, and epidemiological information. The expected result is Negative.  Fact Sheet for Patients: SugarRoll.be  Fact Sheet for Healthcare Providers: https://www.woods-.com/  This test is not yet approved or cleared by the [place] FDA and  has been authorized for detection and/or diagnosis of SARS-CoV-2 by FDA under an Emergency Use Authorization (EUA). This EUA will remain  in effect (meaning this test can be used) for the duration of the COVID-19 declaration under Se ction 564(b)(1) of the Act, 21 U.S.C. section 360bbb-3(b)(1), unless the authorization is terminated or revoked sooner.  Performed at [facility], [place] [address], [place], [place] [address]  Surgical pcr screen     Status: Abnormal   Collection Time: 12/10/20  2:40 PM   Specimen: Nasal Mucosa; Nasal Swab  Result Value Ref Range Status   MRSA, PCR NEGATIVE NEGATIVE Final   Staphylococcus aureus POSITIVE (A) NEGATIVE Final    Comment: (NOTE) The Xpert SA Assay (FDA approved for NASAL specimens in patients 23 years of age and older), is one component of a comprehensive surveillance program. It is not intended to diagnose infection nor to guide or monitor treatment. Performed at [facility], [place] [address], [place], [place] [address]           Radiology Studies: DG Chest Port 1 View  Result Date: 12/11/2020 CLINICAL DATA:  Chest tube, status post lobectomy EXAM: PORTABLE CHEST 1 VIEW COMPARISON:  Radiograph 12/10/2020, chest CT 12/09/2020 FINDINGS: Postsurgical changes of  right upper lobectomy. There is a persistent small right apical pneumothorax, though decreased in size in comparison to the radiograph yesterday. Unchanged position of right apical chest tube. There is no new focal airspace disease. The left lung is clear. There is no large pleural effusion. No acute osseous abnormality. Unchanged subcutaneous emphysema along the right lower chest wall. IMPRESSION: Persistent small right apical pneumothorax, decreased in size in comparison to yesterday's radiograph. Unchanged position of right apical chest tube. Electronically Signed   By: Maurine Simmering M.D.   On: 12/11/2020 09:26   DG Chest Port 1 View  Result Date: 12/10/2020 CLINICAL DATA:  Status post lobectomy on the right EXAM: PORTABLE CHEST 1 VIEW COMPARISON:  12/09/2020 CT FINDINGS: Previously seen chest tube on the right is been removed and a new chest tube placed with the tip noted in the apex. There remains an apical pneumothorax on the right although significantly improved from prior CT examination. No sizable effusion is noted. Left lung is clear. Cardiac shadow and bony structures are within normal limits. IMPRESSION: Right apical pneumothorax with new chest tube in place. The pneumothorax is smaller than that seen on the prior CT exam. Electronically Signed   By: Inez Catalina M.D.   On: 12/10/2020 19:56        Scheduled Meds:  acetaminophen  1,000 mg Oral Q6H   Or   acetaminophen (TYLENOL) oral liquid 160 mg/5 mL  1,000 mg Oral Q6H   bisacodyl  10 mg Oral Daily   Chlorhexidine Gluconate Cloth  6 each Topical Q0600   enoxaparin (LOVENOX) injection  40 mg Subcutaneous Daily   insulin aspart  0-15 Units Subcutaneous Q4H   insulin aspart  3 Units Subcutaneous TID WC   insulin glargine-yfgn  15 Units Subcutaneous QHS   ketorolac  15 mg Intravenous Q6H   lidocaine  1 patch Transdermal Q24H   mupirocin ointment  1 application Nasal BID   risperiDONE  3 mg Oral Daily   senna-docusate  1 tablet Oral QHS    Continuous Infusions:  cefTRIAXone (ROCEPHIN)  IV Stopped (12/09/20 1718)     LOS: 6 days    Time spent: 35 min    Georgette Shell, MD 12/11/2020, 2:39 PM

## 2020-12-11 NOTE — Progress Notes (Addendum)
Inpatient Diabetes Program Recommendations  AACE/ADA: New Consensus Statement on Inpatient Glycemic Control (2015)  Target Ranges:  Prepandial:   less than 140 mg/dL      Peak postprandial:   less than 180 mg/dL (1-2 hours)      Critically ill patients:  140 - 180 mg/dL  Results for Pedro [name], Pedro [name] (MRN [id number]) as of 12/11/2020 09:48  Ref. Range 12/10/2020 00:12 12/10/2020 04:30 12/10/2020 07:49 12/10/2020 11:20 12/10/2020 14:35 12/10/2020 18:03 12/10/2020 20:55  Glucose-Capillary Latest Ref Range: 70 - 99 mg/dL 271 (H)   283 (H)    292 (H)  6 units Novolog     196 (H)  Refused Novolog 188 (H) 288 (H)  8 units Novolog  298 (H)  Refused Novolog   15 units Semglee @2258   Results for Pedro [name], Pedro [name] (MRN [id number]) as of 12/11/2020 09:48  Ref. Range 12/11/2020 00:00 12/11/2020 04:10 12/11/2020 08:11  Glucose-Capillary Latest Ref Range: 70 - 99 mg/dL 246 (H)  Refused Novolog 350 (H)  11 units Novolog 338 (H)  14 units Novolog    History: Type 1 Diabetes  Home DM Meds: 70/30 insulin 40 units BID  Current Orders: Semglee 15 units QHS     Novolog Moderate Correction Scale/ SSI (0-15 units) Q4 hours     Novolog 3 units TID with meals    MD- Please consider:  1. Increase Semglee to 25 units QHS (0.4 units/kg)  2. Increase Novolog Meal Coverage to 5 units TID with meals  3. Change Novolog SSi to TID AC + HS (currently Q4 hours and pt now allowed PO diet)   Addendum 12:30pm--Called pt by phone (this diabetes RN working remotely today).  Pt told me he was diagnosed with diabetes at age 16.  Has been taking 70/30 Insulin 40 units BID for a long time now due to inability to afford Lantus and Novolog (used to take Lantus and Novolog but could no longer afford when he lost his insurance).  Checks CBGs on occasion at home.  Is unhappy with his current PCP and plans to seek care under another PCP after d/c.  Currently buys 70/30 insulin OTC at [facility] for $25 per vial and  usually takes the 40 units BID without any adjustments.    Spoke with patient about his current A1c of 10.5%.  Explained what an A1c is and what it measures.  Reminded patient that his goal A1c is 7% or less per ADA standards to prevent both acute and long-term complications.  Explained to patient the extreme importance of good glucose control at home.  Encouraged patient to check his CBGs at least bid at home (prior to taking he 70/30 insulin) and to record all CBGs in a logbook for his PCP to review.  Also reviewed CBG goals for home.  Pt appreciative of call and did not have any questions for me at this time.     --Will follow patient during hospitalization--  Wyn Quaker RN, MSN, CDE Diabetes Coordinator Inpatient Glycemic Control Team Team Pager: [phone] (8a-5p)

## 2020-12-11 NOTE — Progress Notes (Addendum)
      [address]       [place] [address]             [phone]      1 Day Post-Op Procedure(s) (LRB): XI ROBOTIC ASSISTED THORASCOPY-WEDGE RESECTION (Right) APICAL PLEURECTOMY (Right) MECHANICAL PLEURADESIS (Right) INTERCOSTAL NERVE BLOCK (Right) Subjective: Awake and alert, pain control reasonable.    Objective: Vital signs in last 24 hours: Temp:  [97.3 F (36.3 C)-98.2 F (36.8 C)] 98.2 F (36.8 C) (10/07 0411) Pulse Rate:  [84-106] 89 (10/07 0411) Cardiac Rhythm: Normal sinus rhythm (10/06 2011) Resp:  [11-36] 14 (10/07 0411) BP: (111-152)/(80-99) 111/80 (10/07 0411) SpO2:  [98 %-100 %] 98 % (10/07 0411)    Intake/Output from previous day: 10/06 0701 - 10/07 0700 In: 700 [I.V.:700] Out: 1070 [Urine:450; Blood:25; Chest Tube:595] Intake/Output this shift: No intake/output data recorded.  General appearance: alert, cooperative, and mild distress Heart: regular rate and rhythm,  monitor showing SR/ST. Lungs: breath sounds clear, no distress. Active air leak, CT on suction. CXR showing small apical PTX.  Wound: CT dressing is dry, connections secure.  Lab Results: Recent Labs    12/09/20 0225 12/11/20 0147  WBC 17.4* 17.5*  HGB 14.3 14.8  HCT 44.7 45.0  PLT 322 307    BMET:  Recent Labs    12/11/20 0147  NA 133*  K 4.6  CL 99  CO2 23  GLUCOSE 240*  BUN 28*  CREATININE 1.12  CALCIUM 9.0     PT/INR: No results for input(s): LABPROT, INR in the last 72 hours. ABG    Component Value Date/Time   PHART 7.336 (L) 04/30/2014 1215   HCO3 28.9 (H) 09/16/2018 1338   TCO2 11.3 04/30/2014 1215   ACIDBASEDEF 14.5 (H) 11/09/2016 1745   O2SAT 90.7 09/16/2018 1338   CBG (last 3)  Recent Labs    12/10/20 2055 12/11/20 0000 12/11/20 0410  GLUCAP 298* 246* 350*     Assessment/Plan: S/P Procedure(s) (LRB): XI ROBOTIC ASSISTED THORASCOPY-WEDGE RESECTION (Right) APICAL PLEURECTOMY (Right) MECHANICAL PLEURADESIS  (Right) INTERCOSTAL NERVE BLOCK (Right)chest tube placement yesterday  CV- NSR in the 90s-100's, BP well controlled Pulm-chest tube in place on suction with an active air leak. Minimal drainage. CXR showing small apical space that is expected following large wedge resection. Encourage pulmonary hygiene, IS.  Leave the CT to suction for 48 hours.   3.   H and H 14/45,  stable 4.   Endo- H/O type 1 DM. Blood glucose 240-300 past 24 hours. On    Semglee and sliding scale insulin.       LOS: 6 days    [name], [place] [phone] 12/11/2020   Agree with above Small air leak Residual space CXR Will keep CT to suction for full 48hrs. Aggressive pulm toilet  Maryclare Nydam O Ted Goodner

## 2020-12-12 ENCOUNTER — Inpatient Hospital Stay (HOSPITAL_COMMUNITY): Payer: BC Managed Care – PPO

## 2020-12-12 DIAGNOSIS — J9311 Primary spontaneous pneumothorax: Secondary | ICD-10-CM | POA: Diagnosis not present

## 2020-12-12 LAB — COMPREHENSIVE METABOLIC PANEL
ALT: 8 U/L (ref 0–44)
AST: 15 U/L (ref 15–41)
Albumin: 2.7 g/dL — ABNORMAL LOW (ref 3.5–5.0)
Alkaline Phosphatase: 53 U/L (ref 38–126)
Anion gap: 8 (ref 5–15)
BUN: 36 mg/dL — ABNORMAL HIGH (ref 6–20)
CO2: 27 mmol/L (ref 22–32)
Calcium: 9 mg/dL (ref 8.9–10.3)
Chloride: 98 mmol/L (ref 98–111)
Creatinine, Ser: 1.22 mg/dL (ref 0.61–1.24)
GFR, Estimated: >60 mL/min (ref >60)
Glucose, Bld: 208 mg/dL — ABNORMAL HIGH (ref 70–99)
Potassium: 4.4 mmol/L (ref 3.5–5.1)
Sodium: 133 mmol/L — ABNORMAL LOW (ref 135–145)
Total Bilirubin: 0.8 mg/dL (ref 0.3–1.2)
Total Protein: 5.4 g/dL — ABNORMAL LOW (ref 6.5–8.1)

## 2020-12-12 LAB — GLUCOSE, CAPILLARY
Glucose-Capillary: 207 mg/dL — ABNORMAL HIGH (ref 70–99)
Glucose-Capillary: 194 mg/dL — ABNORMAL HIGH (ref 70–99)
Glucose-Capillary: 220 mg/dL — ABNORMAL HIGH (ref 70–99)
Glucose-Capillary: 323 mg/dL — ABNORMAL HIGH (ref 70–99)

## 2020-12-12 LAB — CBC
HCT: 42.4 % (ref 39.0–52.0)
Hemoglobin: 13.8 g/dL (ref 13.0–17.0)
MCH: 29.7 pg (ref 26.0–34.0)
MCHC: 32.5 g/dL (ref 30.0–36.0)
MCV: 91.2 fL (ref 80.0–100.0)
Platelets: 307 10*3/uL (ref 150–400)
RBC: 4.65 MIL/uL (ref 4.22–5.81)
RDW: 12.4 % (ref 11.5–15.5)
WBC: 11.1 10*3/uL — ABNORMAL HIGH (ref 4.0–10.5)
nRBC: 0 % (ref 0.0–0.2)

## 2020-12-12 NOTE — Progress Notes (Addendum)
      [address]       [place] [address]             [phone]      2 Days Post-Op Procedure(s) (LRB): XI ROBOTIC ASSISTED THORASCOPY-WEDGE RESECTION (Right) APICAL PLEURECTOMY (Right) MECHANICAL PLEURADESIS (Right) INTERCOSTAL NERVE BLOCK (Right) Subjective: Says he is still having some soreness but it is improving.   Objective: Vital signs in last 24 hours: Temp:  [97.6 F (36.4 C)-98.2 F (36.8 C)] 97.6 F (36.4 C) (10/08 0755) Pulse Rate:  [76-97] 92 (10/08 0755) Cardiac Rhythm: Normal sinus rhythm (10/07 2005) Resp:  [10-16] 16 (10/08 0755) BP: (118-123)/(80-94) 120/94 (10/08 0755) SpO2:  [96 %-100 %] 100 % (10/08 0755)    Intake/Output from previous day: 10/07 0701 - 10/08 0700 In: 240 [P.O.:240] Out: 580 [Urine:580] Intake/Output this shift: No intake/output data recorded.  General appearance: alert, cooperative, and mild distress Heart: regular rate and rhythm,  monitor showing SR/ST. Lungs: breath sounds clear, no distress. CT is on suction, small 1+ air leak about the same as yesterday. CXR showing smaller apical PTX compared with yesterday.  Wound: CT dressing is dry, connections secure.  Lab Results: Recent Labs    12/11/20 0147 12/12/20 0135  WBC 17.5* 11.1*  HGB 14.8 13.8  HCT 45.0 42.4  PLT 307 307    BMET:  Recent Labs    12/11/20 0147 12/12/20 0135  NA 133* 133*  K 4.6 4.4  CL 99 98  CO2 23 27  GLUCOSE 240* 208*  BUN 28* 36*  CREATININE 1.12 1.22  CALCIUM 9.0 9.0     PT/INR: No results for input(s): LABPROT, INR in the last 72 hours. ABG    Component Value Date/Time   PHART 7.336 (L) 04/30/2014 1215   HCO3 28.9 (H) 09/16/2018 1338   TCO2 11.3 04/30/2014 1215   ACIDBASEDEF 14.5 (H) 11/09/2016 1745   O2SAT 90.7 09/16/2018 1338   CBG (last 3)  Recent Labs    12/11/20 2049 12/11/20 2347 12/12/20 0350  GLUCAP 275* 210* 207*     Assessment/Plan: S/P Procedure(s) (LRB): XI ROBOTIC ASSISTED  THORASCOPY-WEDGE RESECTION (Right) APICAL PLEURECTOMY (Right) MECHANICAL PLEURADESIS (Right) INTERCOSTAL NERVE BLOCK (Right)chest tube placement yesterday  CV- NSR in the 90s-100's, BP well controlled Pulm-chest tube in place on suction with an active air leak unchanged from yesterday. Minimal drainage. CXR showing small improvement in the small apical space that is expected following large wedge resection. Encouraging pulmonary hygiene, IS.  Leave the CT to suction for another 24 hours.   3.   H and H 14/45,  stable 4.   Endo- H/O type 1 DM. Mgt per primary team.       LOS: 7 days    [name], PA-C [phone] 12/12/2020   Patient seen and examined agree with above Still has an air leak, CXR looks good with only a tiny apical space Will try CT on water seal  Remo Lipps C. Roxan Hockey, MD [facility] [phone]

## 2020-12-12 NOTE — Progress Notes (Signed)
PROGRESS NOTE    LI FRAGOSO  BOF:[id number] DOB: Jul 07, 1995 DOA: 12/05/2020 PCP: Neale Burly, MD   Brief Narrative: Pedro [name] is a 25 y.o. male with a PMH significant for type 1 diabetes, polysubstance use. They presented from home to the urgent care and then ED on 12/05/2020 with chest pain and difficulty breathing, nausea, lightheadedness x several days. In the ED, it was found that they had right-sided spontaneous tension pneumothorax. They were treated with chest tube.  Patient was admitted to medicine service for further workup and management of pneumothorax as outlined in detail below.  Assessment & Plan:   Principal Problem:   Primary spontaneous pneumothorax Active Problems:   DM type 1 (diabetes mellitus, type 1) (HCC)   Hyperglycemia   Chest tube in place   Spontaneous R pneumothorax- On Chest tube in right chest. He is status post robotic assisted thoracoscopy with wedge resection and apical pleurectomy, mechanical pleurodesis and intercostal nerve block.  Plan per CT surgery.  Poorly controlled diabetes mellitus type 1 with hyperglycemia - hgb A1c 10.5 this admission.  Fasting glucose 163 this morning. -Increased semglee to 25u daily.  -Increased NovoLog to 5 units 3 times a day before meals and continue SSI CBG (last 3)  Recent Labs    12/11/20 2347 12/12/20 0350 12/12/20 1218  GLUCAP 210* 207* 194*       Substance abuse history- Denies current drug use to admitting physician.  UDS positive for opiates, benzodiazepines, amphetamines, THC.    Continue home risperidone   Estimated body mass index is 18.83 kg/m as calculated from the following:   Height as of this encounter: 5\' 11"  (1.803 m).   Weight as of this encounter: 61.2 kg.  DVT prophylaxis: NONE Code Status: FULL Family Communication: NONE Disposition Plan:  Status is: Inpatient  Remains inpatient appropriate because:IV treatments appropriate due to intensity of illness or  inability to take PO  Dispo: The patient is from: Home              Anticipated d/c is to: Home              Patient currently is not medically stable to d/c.   Difficult to place patient NO   Consultants: CT  Procedures: CHEST TUBE Apical pleurectomy mechanical pleurodesis intercostal nerve block and robotic assisted thoracic Koska P with wedge resection on 12/10/2020.  Antimicrobials:none  Subjective: Resting in bed anxious to go home  Objective: Vitals:   12/11/20 2348 12/12/20 0352 12/12/20 0755 12/12/20 1214  BP: 120/88 121/85 (!) 120/94 116/81  Pulse: 78 76 92 96  Resp: 13 14 16 17   Temp: 97.6 F (36.4 C) 97.8 F (36.6 C) 97.6 F (36.4 C) 98 F (36.7 C)  TempSrc: Oral Oral Oral Oral  SpO2: 98% 100% 100% 96%  Weight:      Height:        Intake/Output Summary (Last 24 hours) at 12/12/2020 1235 Last data filed at 12/12/2020 0353 Gross per 24 hour  Intake --  Output 580 ml  Net -580 ml    Filed Weights   12/05/20 1459 12/05/20 2250  Weight: 63.5 kg 61.2 kg    Examination:  General exam: Appears calm and comfortable  Respiratory system: Clear to auscultation. Respiratory effort normal.  Chest tube in place right thorax Cardiovascular system: S1 & S2 heard, RRR. No JVD, murmurs, rubs, gallops or clicks. No pedal edema. Gastrointestinal system: Abdomen is nondistended, soft and nontender. No organomegaly or masses  felt. Normal bowel sounds heard. Central nervous system: Alert and oriented. No focal neurological deficits. Extremities: Symmetric 5 x 5 power. Skin: No rashes, lesions or ulcers Psychiatry: Judgement and insight appear normal. Mood & affect appropriate.     Data Reviewed: I have personally reviewed following labs and imaging studies  CBC: Recent Labs  Lab 12/07/20 0106 12/08/20 0802 12/09/20 0225 12/11/20 0147 12/12/20 0135  WBC 13.7* 8.9 17.4* 17.5* 11.1*  HGB 14.4 15.3 14.3 14.8 13.8  HCT 45.3 47.5 44.7 45.0 42.4  MCV 93.8 92.1 92.0  91.6 91.2  PLT 306 310 322 307 329    Basic Metabolic Panel: Recent Labs  Lab 12/05/20 1511 12/05/20 2336 12/06/20 0110 12/11/20 0147 12/12/20 0135  NA 133* 131* 136 133* 133*  K 4.2 5.0 4.2 4.6 4.4  CL 98 100 102 99 98  CO2 24 20* 23 23 27   GLUCOSE 361* 564* 330* 240* 208*  BUN 27* 34* 33* 28* 36*  CREATININE 1.30* 1.76* 1.61* 1.12 1.22  CALCIUM 9.0 8.6* 8.9 9.0 9.0    GFR: Estimated Creatinine Clearance: 78.7 mL/min (by C-G formula based on SCr of 1.22 mg/dL). Liver Function Tests: Recent Labs  Lab 12/12/20 0135  AST 15  ALT 8  ALKPHOS 53  BILITOT 0.8  PROT 5.4*  ALBUMIN 2.7*   No results for input(s): LIPASE, AMYLASE in the last 168 hours. No results for input(s): AMMONIA in the last 168 hours. Coagulation Profile: No results for input(s): INR, PROTIME in the last 168 hours. Cardiac Enzymes: No results for input(s): CKTOTAL, CKMB, CKMBINDEX, TROPONINI in the last 168 hours. BNP (last 3 results) No results for input(s): PROBNP in the last 8760 hours. HbA1C: No results for input(s): HGBA1C in the last 72 hours. CBG: Recent Labs  Lab 12/11/20 1632 12/11/20 2049 12/11/20 2347 12/12/20 0350 12/12/20 1218  GLUCAP 210* 275* 210* 207* 194*    Lipid Profile: No results for input(s): CHOL, HDL, LDLCALC, TRIG, CHOLHDL, LDLDIRECT in the last 72 hours. Thyroid Function Tests: No results for input(s): TSH, T4TOTAL, FREET4, T3FREE, THYROIDAB in the last 72 hours. Anemia Panel: No results for input(s): VITAMINB12, FOLATE, FERRITIN, TIBC, IRON, RETICCTPCT in the last 72 hours. Sepsis Labs: No results for input(s): PROCALCITON, LATICACIDVEN in the last 168 hours.  Recent Results (from the past 240 hour(s))  SARS CORONAVIRUS 2 (TAT 6-24 HRS) Nasopharyngeal Nasopharyngeal Swab     Status: None   Collection Time: 12/05/20  4:30 PM   Specimen: Nasopharyngeal Swab  Result Value Ref Range Status   SARS Coronavirus 2 NEGATIVE NEGATIVE Final    Comment:  (NOTE) SARS-CoV-2 target nucleic acids are NOT DETECTED.  The SARS-CoV-2 RNA is generally detectable in upper and lower respiratory specimens during the acute phase of infection. Negative results do not preclude SARS-CoV-2 infection, do not rule out co-infections with other pathogens, and should not be used as the sole basis for treatment or other patient management decisions. Negative results must be combined with clinical observations, patient history, and epidemiological information. The expected result is Negative.  Fact Sheet for Patients: SugarRoll.be  Fact Sheet for Healthcare Providers: https://www.woods-.com/  This test is not yet approved or cleared by the [place] FDA and  has been authorized for detection and/or diagnosis of SARS-CoV-2 by FDA under an Emergency Use Authorization (EUA). This EUA will remain  in effect (meaning this test can be used) for the duration of the COVID-19 declaration under Se ction 564(b)(1) of the Act, 21 U.S.C. section 360bbb-3(b)(1), unless  the authorization is terminated or revoked sooner.  Performed at [facility], [place] [address] [place], [place] [address]   Surgical pcr screen     Status: Abnormal   Collection Time: 12/10/20  2:40 PM   Specimen: Nasal Mucosa; Nasal Swab  Result Value Ref Range Status   MRSA, PCR NEGATIVE NEGATIVE Final   Staphylococcus aureus POSITIVE (A) NEGATIVE Final    Comment: (NOTE) The Xpert SA Assay (FDA approved for NASAL specimens in patients 34 years of age and older), is one component of a comprehensive surveillance program. It is not intended to diagnose infection nor to guide or monitor treatment. Performed at [facility], [place] [address] [place],  [address]           Radiology Studies: DG CHEST PORT 1 VIEW  Result Date: 12/12/2020 CLINICAL DATA:  Pneumothorax EXAM: PORTABLE CHEST 1 VIEW COMPARISON:  Chest radiograph  from one day prior. FINDINGS: Right apical chest tube in place. Stable cardiomediastinal silhouette with normal heart size. Small right apical pneumothorax, slightly decreased, less than 5%. No left pneumothorax. No pleural effusion. Surgical sutures overlying the medial upper right lung with associated surrounding mild hazy opacity, unchanged. Otherwise clear lungs with no pulmonary edema. IMPRESSION: Small right apical pneumothorax, slightly decreased. Right apical chest tube in place. Stable hazy opacity in the medial upper right lung surrounding a suture line compatible with postsurgical change. Electronically Signed   By: Ilona Sorrel M.D.   On: 12/12/2020 10:26   DG Chest Port 1 View  Result Date: 12/11/2020 CLINICAL DATA:  Chest tube, status post lobectomy EXAM: PORTABLE CHEST 1 VIEW COMPARISON:  Radiograph 12/10/2020, chest CT 12/09/2020 FINDINGS: Postsurgical changes of right upper lobectomy. There is a persistent small right apical pneumothorax, though decreased in size in comparison to the radiograph yesterday. Unchanged position of right apical chest tube. There is no new focal airspace disease. The left lung is clear. There is no large pleural effusion. No acute osseous abnormality. Unchanged subcutaneous emphysema along the right lower chest wall. IMPRESSION: Persistent small right apical pneumothorax, decreased in size in comparison to yesterday's radiograph. Unchanged position of right apical chest tube. Electronically Signed   By: Maurine Simmering M.D.   On: 12/11/2020 09:26   DG Chest Port 1 View  Result Date: 12/10/2020 CLINICAL DATA:  Status post lobectomy on the right EXAM: PORTABLE CHEST 1 VIEW COMPARISON:  12/09/2020 CT FINDINGS: Previously seen chest tube on the right is been removed and a new chest tube placed with the tip noted in the apex. There remains an apical pneumothorax on the right although significantly improved from prior CT examination. No sizable effusion is noted. Left lung  is clear. Cardiac shadow and bony structures are within normal limits. IMPRESSION: Right apical pneumothorax with new chest tube in place. The pneumothorax is smaller than that seen on the prior CT exam. Electronically Signed   By: Inez Catalina M.D.   On: 12/10/2020 19:56        Scheduled Meds:  acetaminophen  1,000 mg Oral Q6H   Or   acetaminophen (TYLENOL) oral liquid 160 mg/5 mL  1,000 mg Oral Q6H   bisacodyl  10 mg Oral Daily   Chlorhexidine Gluconate Cloth  6 each Topical Q0600   enoxaparin (LOVENOX) injection  40 mg Subcutaneous Daily   insulin aspart  0-5 Units Subcutaneous QHS   insulin aspart  0-9 Units Subcutaneous TID WC   insulin aspart  5 Units Subcutaneous TID WC  insulin glargine-yfgn  25 Units Subcutaneous QHS   ketorolac  15 mg Intravenous Q6H   lidocaine  1 patch Transdermal Q24H   mupirocin ointment  1 application Nasal BID   risperiDONE  3 mg Oral Daily   senna-docusate  1 tablet Oral QHS   Continuous Infusions:  cefTRIAXone (ROCEPHIN)  IV 1 g (12/11/20 1817)     LOS: 7 days    Time spent: 35 min    Georgette Shell, MD 12/12/2020, 12:35 PM

## 2020-12-13 ENCOUNTER — Inpatient Hospital Stay (HOSPITAL_COMMUNITY): Payer: BC Managed Care – PPO

## 2020-12-13 DIAGNOSIS — J9311 Primary spontaneous pneumothorax: Secondary | ICD-10-CM | POA: Diagnosis not present

## 2020-12-13 LAB — GLUCOSE, CAPILLARY
Glucose-Capillary: 210 mg/dL — ABNORMAL HIGH (ref 70–99)
Glucose-Capillary: 145 mg/dL — ABNORMAL HIGH (ref 70–99)
Glucose-Capillary: 142 mg/dL — ABNORMAL HIGH (ref 70–99)
Glucose-Capillary: 347 mg/dL — ABNORMAL HIGH (ref 70–99)

## 2020-12-13 MED ORDER — BISACODYL 5 MG PO TBEC: 10.0000 mg | DELAYED_RELEASE_TABLET | Freq: Every day | ORAL | Status: DC

## 2020-12-13 MED ORDER — LACTULOSE 10 GM/15ML PO SOLN
10.0000 g | Freq: Once | ORAL | Status: AC
  Administered 2020-12-13: 10 g via ORAL
  Filled 2020-12-13: qty 15

## 2020-12-13 MED ORDER — INSULIN GLARGINE-YFGN 100 UNIT/ML ~~LOC~~ SOLN
30.0000 [IU] | Freq: Every day | SUBCUTANEOUS | Status: DC
  Administered 2020-12-13 – 2020-12-14 (×2): 30 [IU] via SUBCUTANEOUS
  Filled 2020-12-13 (×3): qty 0.3

## 2020-12-13 NOTE — Progress Notes (Addendum)
      [address]       [place] [address]             [phone]      3 Days Post-Op Procedure(s) (LRB): XI ROBOTIC ASSISTED THORASCOPY-WEDGE RESECTION (Right) APICAL PLEURECTOMY (Right) MECHANICAL PLEURADESIS (Right) INTERCOSTAL NERVE BLOCK (Right) Subjective: Sitting up in bed, says he feels OK. No new concerns, pain controlled.   Objective: Vital signs in last 24 hours: Temp:  [97.7 F (36.5 C)-98.6 F (37 C)] 97.7 F (36.5 C) (10/09 0808) Pulse Rate:  [75-96] 83 (10/09 0808) Cardiac Rhythm: Normal sinus rhythm (10/08 2022) Resp:  [15-17] 15 (10/09 0808) BP: (113-124)/(77-96) 124/96 (10/09 0808) SpO2:  [96 %-100 %] 98 % (10/09 0808)    Intake/Output from previous day: 10/08 0701 - 10/09 0700 In: 120 [P.O.:120] Out: 350 [Urine:300; Chest Tube:50] Intake/Output this shift: No intake/output data recorded.  General appearance: alert, cooperative, and no distress Heart: regular rate and rhythm,  monitor showing SR/ST. Lungs: breath sounds clear, no distress. CT is on water seal. I do not see an air leak. CXR shows slightly larger apical PTX compared with yesterday.  Wound: CT dressing is dry, connections secure.  Lab Results: Recent Labs    12/11/20 0147 12/12/20 0135  WBC 17.5* 11.1*  HGB 14.8 13.8  HCT 45.0 42.4  PLT 307 307    BMET:  Recent Labs    12/11/20 0147 12/12/20 0135  NA 133* 133*  K 4.6 4.4  CL 99 98  CO2 23 27  GLUCOSE 240* 208*  BUN 28* 36*  CREATININE 1.12 1.22  CALCIUM 9.0 9.0     PT/INR: No results for input(s): LABPROT, INR in the last 72 hours. ABG    Component Value Date/Time   PHART 7.336 (L) 04/30/2014 1215   HCO3 28.9 (H) 09/16/2018 1338   TCO2 11.3 04/30/2014 1215   ACIDBASEDEF 14.5 (H) 11/09/2016 1745   O2SAT 90.7 09/16/2018 1338   CBG (last 3)  Recent Labs    12/12/20 1625 12/12/20 2122 12/13/20 0616  GLUCAP 220* 323* 210*     Assessment/Plan: S/P Procedure(s) (LRB): XI ROBOTIC  ASSISTED THORASCOPY-WEDGE RESECTION (Right) APICAL PLEURECTOMY (Right) MECHANICAL PLEURADESIS (Right) INTERCOSTAL NERVE BLOCK (Right)chest tube placement yesterday  CV- NSR in the 90s-100's, BP well controlled Pulm-chest tube in place on water seal, minimal drainage, no air leak seen today.  CXR showing slightly larger apical space that is expected following large wedge resection. Encouraging pulmonary hygiene, IS.  CT mgt per Dr. Roxan Hockey.  3.   Endo- H/O type 1 DM. Mgt per primary team.       LOS: 8 days    [name], PA-C [phone] 12/13/2020   Patient seen and examined, agree with above Minimal air leak with cough when I examined him Some bloody drainage from CT CXR shows no change in size of small apical space, but slight increase in fluid Needs to ambulate  Remo Lipps C. Roxan Hockey, MD [facility] [phone]

## 2020-12-13 NOTE — Progress Notes (Signed)
PROGRESS NOTE    [name]  CWC:[id number] DOB: 1995/06/23 DOA: 12/05/2020 PCP: Neale Burly, MD   Brief Narrative: Pedro [name] is a 25 y.o. male with a PMH significant for type 1 diabetes, polysubstance use. They presented from home to the urgent care and then ED on 12/05/2020 with chest pain and difficulty breathing, nausea, lightheadedness x several days. In the ED, it was found that they had right-sided spontaneous tension pneumothorax. They were treated with chest tube.  Patient was admitted to medicine service for further workup and management of pneumothorax as outlined in detail below.  Assessment & Plan:   Principal Problem:   Primary spontaneous pneumothorax Active Problems:   DM type 1 (diabetes mellitus, type 1) (HCC)   Hyperglycemia   Chest tube in place   Spontaneous R pneumothorax- On Chest tube in right chest. He is status post robotic assisted thoracoscopy with wedge resection and apical pleurectomy, mechanical pleurodesis and intercostal nerve block.  Plan per CT surgery. Oob ambulate   Poorly controlled diabetes mellitus type 1 with hyperglycemia - hgb A1c 10.5 this admission. -Increased semglee to 30 u daily.  -Increased NovoLog to 5 units 3 times a day before meals and continue SSI CBG (last 3)  Recent Labs    12/12/20 2122 12/13/20 0616 12/13/20 1232  GLUCAP 323* 210* 145*      Substance abuse history- Denies current drug use to admitting physician.  UDS positive for opiates, benzodiazepines, amphetamines, THC.    Continue home risperidone  Constipation he reports he not having a bowel movement since admission.  We will start stool softeners.  Encouraged him to get out of bed ambulate with the staff.  Estimated body mass index is 18.83 kg/m as calculated from the following:   Height as of this encounter: 5\' 11"  (1.803 m).   Weight as of this encounter: 61.2 kg.  DVT prophylaxis: NONE Code Status: FULL Family Communication:  NONE Disposition Plan:  Status is: Inpatient  Remains inpatient appropriate because:IV treatments appropriate due to intensity of illness or inability to take PO  Dispo: The patient is from: Home              Anticipated d/c is to: Home              Patient currently is not medically stable to d/c.   Difficult to place patient NO   Consultants: CT  Procedures: CHEST TUBE Apical pleurectomy mechanical pleurodesis intercostal nerve block and robotic assisted thoracic Kos[place] P with wedge resection on 12/10/2020.  Antimicrobials:none  Subjective:  Resting in bed feels better than yesterday  Objective: Vitals:   12/12/20 2331 12/13/20 0346 12/13/20 0808 12/13/20 1142  BP: 118/81 113/77 (!) 124/96 118/89  Pulse: 75 76 83 87  Resp: 16 16 15 15   Temp: 98 F (36.7 C) 98.6 F (37 C) 97.7 F (36.5 C) 98.1 F (36.7 C)  TempSrc: Oral Oral Oral Oral  SpO2: 99% 100% 98% 99%  Weight:      Height:        Intake/Output Summary (Last 24 hours) at 12/13/2020 1255 Last data filed at 12/13/2020 0900 Gross per 24 hour  Intake 120 ml  Output 430 ml  Net -310 ml    Filed Weights   12/05/20 1459 12/05/20 2250  Weight: 63.5 kg 61.2 kg    Examination:  General exam: Appears calm and comfortable  Respiratory system: Clear to auscultation. Respiratory effort normal.  Chest tube in place right thorax Cardiovascular  system: S1 & S2 heard, RRR. No JVD, murmurs, rubs, gallops or clicks. No pedal edema. Gastrointestinal system: Abdomen is nondistended, soft and nontender. No organomegaly or masses felt. Normal bowel sounds heard. Central nervous system: Alert and oriented. No focal neurological deficits. Extremities: Symmetric 5 x 5 power. Skin: No rashes, lesions or ulcers Psychiatry: Judgement and insight appear normal. Mood & affect appropriate.     Data Reviewed: I have personally reviewed following labs and imaging studies  CBC: Recent Labs  Lab 12/07/20 0106 12/08/20 0802  12/09/20 0225 12/11/20 0147 12/12/20 0135  WBC 13.7* 8.9 17.4* 17.5* 11.1*  HGB 14.4 15.3 14.3 14.8 13.8  HCT 45.3 47.5 44.7 45.0 42.4  MCV 93.8 92.1 92.0 91.6 91.2  PLT 306 310 322 307 268    Basic Metabolic Panel: Recent Labs  Lab 12/11/20 0147 12/12/20 0135  NA 133* 133*  K 4.6 4.4  CL 99 98  CO2 23 27  GLUCOSE 240* 208*  BUN 28* 36*  CREATININE 1.12 1.22  CALCIUM 9.0 9.0    GFR: Estimated Creatinine Clearance: 78.7 mL/min (by C-G formula based on SCr of 1.22 mg/dL). Liver Function Tests: Recent Labs  Lab 12/12/20 0135  AST 15  ALT 8  ALKPHOS 53  BILITOT 0.8  PROT 5.4*  ALBUMIN 2.7*    No results for input(s): LIPASE, AMYLASE in the last 168 hours. No results for input(s): AMMONIA in the last 168 hours. Coagulation Profile: No results for input(s): INR, PROTIME in the last 168 hours. Cardiac Enzymes: No results for input(s): CKTOTAL, CKMB, CKMBINDEX, TROPONINI in the last 168 hours. BNP (last 3 results) No results for input(s): PROBNP in the last 8760 hours. HbA1C: No results for input(s): HGBA1C in the last 72 hours. CBG: Recent Labs  Lab 12/12/20 1218 12/12/20 1625 12/12/20 2122 12/13/20 0616 12/13/20 1232  GLUCAP 194* 220* 323* 210* 145*    Lipid Profile: No results for input(s): CHOL, HDL, LDLCALC, TRIG, CHOLHDL, LDLDIRECT in the last 72 hours. Thyroid Function Tests: No results for input(s): TSH, T4TOTAL, FREET4, T3FREE, THYROIDAB in the last 72 hours. Anemia Panel: No results for input(s): VITAMINB12, FOLATE, FERRITIN, TIBC, IRON, RETICCTPCT in the last 72 hours. Sepsis Labs: No results for input(s): PROCALCITON, LATICACIDVEN in the last 168 hours.  Recent Results (from the past 240 hour(s))  SARS CORONAVIRUS 2 (TAT 6-24 HRS) Nasopharyngeal Nasopharyngeal Swab     Status: None   Collection Time: 12/05/20  4:30 PM   Specimen: Nasopharyngeal Swab  Result Value Ref Range Status   SARS Coronavirus 2 NEGATIVE NEGATIVE Final    Comment:  (NOTE) SARS-CoV-2 target nucleic acids are NOT DETECTED.  The SARS-CoV-2 RNA is generally detectable in upper and lower respiratory specimens during the acute phase of infection. Negative results do not preclude SARS-CoV-2 infection, do not rule out co-infections with other pathogens, and should not be used as the sole basis for treatment or other patient management decisions. Negative results must be combined with clinical observations, patient history, and epidemiological information. The expected result is Negative.  Fact Sheet for Patients: SugarRoll.be  Fact Sheet for Healthcare Providers: https://www.woods-.com/  This test is not yet approved or cleared by the [place] FDA and  has been authorized for detection and/or diagnosis of SARS-CoV-2 by FDA under an Emergency Use Authorization (EUA). This EUA will remain  in effect (meaning this test can be used) for the duration of the COVID-19 declaration under Se ction 564(b)(1) of the Act, 21 U.S.C. section 360bbb-3(b)(1), unless the  authorization is terminated or revoked sooner.  Performed at [facility], [place] [address], [place], [place] [address]   Surgical pcr screen     Status: Abnormal   Collection Time: 12/10/20  2:40 PM   Specimen: Nasal Mucosa; Nasal Swab  Result Value Ref Range Status   MRSA, PCR NEGATIVE NEGATIVE Final   Staphylococcus aureus POSITIVE (A) NEGATIVE Final    Comment: (NOTE) The Xpert SA Assay (FDA approved for NASAL specimens in patients 27 years of age and older), is one component of a comprehensive surveillance program. It is not intended to diagnose infection nor to guide or monitor treatment. Performed at [facility], [place] [address], [place], [place] [address]           Radiology Studies: DG Chest Port 1 View  Result Date: 12/13/2020 CLINICAL DATA:  Shortness of breath and chest pain. EXAM: PORTABLE CHEST 1 VIEW  COMPARISON:  December 12, 2020 FINDINGS: Right apical chest tube is in place. The small right hydropneumothorax in the apex is unchanged. The left lung is clear. The mediastinal contour and cardiac silhouette are normal. Osseous structures normal. IMPRESSION: Right apical chest tube is in place. The small right hydropneumothorax in the apex is unchanged. Electronically Signed   By: Abelardo Diesel M.D.   On: 12/13/2020 09:10   DG CHEST PORT 1 VIEW  Result Date: 12/12/2020 CLINICAL DATA:  Pneumothorax EXAM: PORTABLE CHEST 1 VIEW COMPARISON:  Chest radiograph from one day prior. FINDINGS: Right apical chest tube in place. Stable cardiomediastinal silhouette with normal heart size. Small right apical pneumothorax, slightly decreased, less than 5%. No left pneumothorax. No pleural effusion. Surgical sutures overlying the medial upper right lung with associated surrounding mild hazy opacity, unchanged. Otherwise clear lungs with no pulmonary edema. IMPRESSION: Small right apical pneumothorax, slightly decreased. Right apical chest tube in place. Stable hazy opacity in the medial upper right lung surrounding a suture line compatible with postsurgical change. Electronically Signed   By: Ilona Sorrel M.D.   On: 12/12/2020 10:26        Scheduled Meds:  acetaminophen  1,000 mg Oral Q6H   Or   acetaminophen (TYLENOL) oral liquid 160 mg/5 mL  1,000 mg Oral Q6H   bisacodyl  10 mg Oral Daily   Chlorhexidine Gluconate Cloth  6 each Topical Q0600   enoxaparin (LOVENOX) injection  40 mg Subcutaneous Daily   insulin aspart  0-5 Units Subcutaneous QHS   insulin aspart  0-9 Units Subcutaneous TID WC   insulin aspart  5 Units Subcutaneous TID WC   insulin glargine-yfgn  25 Units Subcutaneous QHS   lactulose  10 g Oral Once   lidocaine  1 patch Transdermal Q24H   mupirocin ointment  1 application Nasal BID   risperiDONE  3 mg Oral Daily   senna-docusate  1 tablet Oral QHS   Continuous Infusions:  cefTRIAXone  (ROCEPHIN)  IV 1 g (12/12/20 1819)     LOS: 8 days    Time spent: 35 min    Georgette Shell, MD 12/13/2020, 12:55 PM

## 2020-12-13 NOTE — Progress Notes (Signed)
Mobility Specialist Progress Note:   12/13/20 1705  Mobility  Activity Ambulated in hall  Level of Assistance Standby assist, set-up cues, supervision of patient - no hands on  Assistive Device None  Distance Ambulated (ft) 470 ft  Mobility Ambulated with assistance in hallway  Mobility Response Tolerated well  Mobility performed by Mobility specialist  Bed Position Chair  $Mobility charge 1 Mobility   Pt received in bed and willing to participate in mobility.  No complaints of pain and Asx throughout walk. Pt returned to chair with call bell in reach and all needs met.   [facility] Health and safety inspector Phone [phone]

## 2020-12-14 ENCOUNTER — Inpatient Hospital Stay (HOSPITAL_COMMUNITY): Payer: BC Managed Care – PPO

## 2020-12-14 DIAGNOSIS — J9311 Primary spontaneous pneumothorax: Secondary | ICD-10-CM | POA: Diagnosis not present

## 2020-12-14 LAB — GLUCOSE, CAPILLARY
Glucose-Capillary: 117 mg/dL — ABNORMAL HIGH (ref 70–99)
Glucose-Capillary: 280 mg/dL — ABNORMAL HIGH (ref 70–99)
Glucose-Capillary: 205 mg/dL — ABNORMAL HIGH (ref 70–99)
Glucose-Capillary: 159 mg/dL — ABNORMAL HIGH (ref 70–99)
Glucose-Capillary: 313 mg/dL — ABNORMAL HIGH (ref 70–99)

## 2020-12-14 LAB — HEMOGLOBIN AND HEMATOCRIT, BLOOD
HCT: 39.2 % (ref 39.0–52.0)
Hemoglobin: 12.2 g/dL — ABNORMAL LOW (ref 13.0–17.0)

## 2020-12-14 LAB — SURGICAL PATHOLOGY

## 2020-12-14 NOTE — Progress Notes (Addendum)
      [address]       [place] [address]             [phone]      4 Days Post-Op Procedure(s) (LRB): XI ROBOTIC ASSISTED THORASCOPY-WEDGE RESECTION (Right) APICAL PLEURECTOMY (Right) MECHANICAL PLEURADESIS (Right) INTERCOSTAL NERVE BLOCK (Right) Subjective: Feels okay this morning, no complaints.   Objective: Vital signs in last 24 hours: Temp:  [97.7 F (36.5 C)-98.3 F (36.8 C)] 98.2 F (36.8 C) (10/10 0352) Pulse Rate:  [77-87] 77 (10/10 0352) Cardiac Rhythm: Normal sinus rhythm (10/09 1958) Resp:  [15-20] 16 (10/10 0352) BP: (112-134)/(81-96) 115/83 (10/10 0352) SpO2:  [98 %-100 %] 100 % (10/10 0352)    Intake/Output from previous day: 10/09 0701 - 10/10 0700 In: 240 [P.O.:240] Out: 620 [Urine:300; Chest Tube:320] Intake/Output this shift: No intake/output data recorded.  General appearance: alert, cooperative, and no distress Heart: regular rate and rhythm, S1, S2 normal, no murmur, click, rub or gallop Lungs: clear to auscultation bilaterally Abdomen: soft, non-tender; bowel sounds normal; no masses,  no organomegaly Extremities: extremities normal, atraumatic, no cyanosis or edema Wound: clean and dry  Lab Results: Recent Labs    12/12/20 0135  WBC 11.1*  HGB 13.8  HCT 42.4  PLT 307   BMET:  Recent Labs    12/12/20 0135  NA 133*  K 4.4  CL 98  CO2 27  GLUCOSE 208*  BUN 36*  CREATININE 1.22  CALCIUM 9.0    PT/INR: No results for input(s): LABPROT, INR in the last 72 hours. ABG    Component Value Date/Time   PHART 7.336 (L) 04/30/2014 1215   HCO3 28.9 (H) 09/16/2018 1338   TCO2 11.3 04/30/2014 1215   ACIDBASEDEF 14.5 (H) 11/09/2016 1745   O2SAT 90.7 09/16/2018 1338   CBG (last 3)  Recent Labs    12/13/20 1650 12/13/20 2141 12/14/20 0614  GLUCAP 142* 347* 280*    Assessment/Plan: S/P Procedure(s) (LRB): XI ROBOTIC ASSISTED THORASCOPY-WEDGE RESECTION (Right) APICAL PLEURECTOMY (Right) MECHANICAL  PLEURADESIS (Right) INTERCOSTAL NERVE BLOCK (Right)  CV- NSR in the 90s-100's, BP well controlled Pulm-chest tube in place on water seal since Sat,  minimal drainage, no air leak seen today.  CXR showing apical space that is expected following large wedge resection. Encouraging pulmonary hygiene, IS.   3.   Endo- H/O type 1 DM. Mgt per primary team.  Plan: Discontinue chest tube, CXR in the morning and possible discharge tomorrow if remains stable.    LOS: 9 days    Elgie Collard 12/14/2020  Doing well Will remove Cts  Makih Stefanko O Julicia Krieger

## 2020-12-14 NOTE — Progress Notes (Signed)
Mobility Specialist Progress Note    12/14/20 1201  Mobility  Activity Ambulated in hall  Level of Assistance Independent  Assistive Device None  Distance Ambulated (ft) 450 ft  Mobility Ambulated independently in hallway  Mobility Response Tolerated well  Mobility performed by Mobility specialist  $Mobility charge 1 Mobility   Pre-Mobility: 91 HR, 112/83 BP, 100% SpO2 During Mobility: 118 HR, 100% SpO2 Post-Mobility: 86 HR, 130/91 BP, 100% SpO2  Pt received in bed and agreeable. No complaints on walk. Returned to bed with call bell in reach.   Hildred Alamin Mobility Specialist  Mobility Specialist Phone: [phone]

## 2020-12-14 NOTE — Progress Notes (Signed)
PROGRESS NOTE    ALIJAH AKRAM  NUU:[id number] DOB: Nov 14, 1995 DOA: 12/05/2020 PCP: Neale Burly, MD   Brief Narrative: Pedro [name] is a 25 y.o. male with a PMH significant for type 1 diabetes, polysubstance use. They presented from home to the urgent care and then ED on 12/05/2020 with chest pain and difficulty breathing, nausea, lightheadedness x several days. In the ED, it was found that they had right-sided spontaneous tension pneumothorax. They were treated with chest tube.  Patient was admitted to medicine service for further workup and management of pneumothorax as outlined in detail below.  Assessment & Plan:   Principal Problem:   Primary spontaneous pneumothorax Active Problems:   DM type 1 (diabetes mellitus, type 1) (HCC)   Hyperglycemia   Chest tube in place   Spontaneous R pneumothorax-chest tube removed 12/14/2020 He is status post robotic assisted thoracoscopy with wedge resection and apical pleurectomy, mechanical pleurodesis and intercostal nerve block.  Plan per CT surgery. Oob ambulate  Home tomorrow if he is stable Poorly controlled diabetes mellitus type 1 with hyperglycemia - hgb A1c 10.5 this admission. -Increased semglee to 30 u daily.  -Increased NovoLog to 5 units 3 times a day before meals and continue SSI CBG (last 3)  Recent Labs    12/13/20 2141 12/14/20 0614 12/14/20 1148  GLUCAP 347* 280* 205*      Substance abuse history- Denies current drug use to admitting physician.  UDS positive for opiates, benzodiazepines, amphetamines, THC.    Continue home risperidone  Constipation he reports he not having a bowel movement since admission.  We will start stool softeners.  Encouraged him to get out of bed ambulate with the staff.  Estimated body mass index is 18.83 kg/m as calculated from the following:   Height as of this encounter: 5\' 11"  (1.803 m).   Weight as of this encounter: 61.2 kg.  DVT prophylaxis: NONE Code Status:  FULL Family Communication: NONE Disposition Plan:  Status is: Inpatient  Remains inpatient appropriate because:IV treatments appropriate due to intensity of illness or inability to take PO  Dispo: The patient is from: Home              Anticipated d/c is to: Home              Patient currently is not medically stable to d/c.   Difficult to place patient NO   Consultants: CT  Procedures: CHEST TUBE Apical pleurectomy mechanical pleurodesis intercostal nerve block and robotic assisted thoracic [place] P with wedge resection on 12/10/2020.  Antimicrobials:none  Subjective: Resting in bed he is very anxious to go home he ambulated in the hallway yesterday and today  Objective: Vitals:   12/13/20 2341 12/14/20 0352 12/14/20 0811 12/14/20 1150  BP: 113/81 115/83 116/82 112/83  Pulse: 78 77 81 83  Resp: 20 16 17 16   Temp: 98.3 F (36.8 C) 98.2 F (36.8 C) 98 F (36.7 C) 97.7 F (36.5 C)  TempSrc: Oral Oral Oral Oral  SpO2: 99% 100% 100% 100%  Weight:      Height:        Intake/Output Summary (Last 24 hours) at 12/14/2020 1419 Last data filed at 12/14/2020 0600 Gross per 24 hour  Intake 240 ml  Output 540 ml  Net -300 ml    Filed Weights   12/05/20 1459 12/05/20 2250  Weight: 63.5 kg 61.2 kg    Examination:  General exam: Appears calm and comfortable  Respiratory system: Clear to auscultation.  Respiratory effort normal.  Chest tube in place right thorax Cardiovascular system: S1 & S2 heard, RRR. No JVD, murmurs, rubs, gallops or clicks. No pedal edema. Gastrointestinal system: Abdomen is nondistended, soft and nontender. No organomegaly or masses felt. Normal bowel sounds heard. Central nervous system: Alert and oriented. No focal neurological deficits. Extremities: Symmetric 5 x 5 power. Skin: No rashes, lesions or ulcers Psychiatry: Judgement and insight appear normal. Mood & affect appropriate.     Data Reviewed: I have personally reviewed following labs and  imaging studies  CBC: Recent Labs  Lab 12/08/20 0802 12/09/20 0225 12/11/20 0147 12/12/20 0135 12/14/20 0913  WBC 8.9 17.4* 17.5* 11.1*  --   HGB 15.3 14.3 14.8 13.8 12.2*  HCT 47.5 44.7 45.0 42.4 39.2  MCV 92.1 92.0 91.6 91.2  --   PLT 310 322 307 307  --     Basic Metabolic Panel: Recent Labs  Lab 12/11/20 0147 12/12/20 0135  NA 133* 133*  K 4.6 4.4  CL 99 98  CO2 23 27  GLUCOSE 240* 208*  BUN 28* 36*  CREATININE 1.12 1.22  CALCIUM 9.0 9.0    GFR: Estimated Creatinine Clearance: 78.7 mL/min (by C-G formula based on SCr of 1.22 mg/dL). Liver Function Tests: Recent Labs  Lab 12/12/20 0135  AST 15  ALT 8  ALKPHOS 53  BILITOT 0.8  PROT 5.4*  ALBUMIN 2.7*    No results for input(s): LIPASE, AMYLASE in the last 168 hours. No results for input(s): AMMONIA in the last 168 hours. Coagulation Profile: No results for input(s): INR, PROTIME in the last 168 hours. Cardiac Enzymes: No results for input(s): CKTOTAL, CKMB, CKMBINDEX, TROPONINI in the last 168 hours. BNP (last 3 results) No results for input(s): PROBNP in the last 8760 hours. HbA1C: No results for input(s): HGBA1C in the last 72 hours. CBG: Recent Labs  Lab 12/13/20 1232 12/13/20 1650 12/13/20 2141 12/14/20 0614 12/14/20 1148  GLUCAP 145* 142* 347* 280* 205*    Lipid Profile: No results for input(s): CHOL, HDL, LDLCALC, TRIG, CHOLHDL, LDLDIRECT in the last 72 hours. Thyroid Function Tests: No results for input(s): TSH, T4TOTAL, FREET4, T3FREE, THYROIDAB in the last 72 hours. Anemia Panel: No results for input(s): VITAMINB12, FOLATE, FERRITIN, TIBC, IRON, RETICCTPCT in the last 72 hours. Sepsis Labs: No results for input(s): PROCALCITON, LATICACIDVEN in the last 168 hours.  Recent Results (from the past 240 hour(s))  SARS CORONAVIRUS 2 (TAT 6-24 HRS) Nasopharyngeal Nasopharyngeal Swab     Status: None   Collection Time: 12/05/20  4:30 PM   Specimen: Nasopharyngeal Swab  Result Value Ref  Range Status   SARS Coronavirus 2 NEGATIVE NEGATIVE Final    Comment: (NOTE) SARS-CoV-2 target nucleic acids are NOT DETECTED.  The SARS-CoV-2 RNA is generally detectable in upper and lower respiratory specimens during the acute phase of infection. Negative results do not preclude SARS-CoV-2 infection, do not rule out co-infections with other pathogens, and should not be used as the sole basis for treatment or other patient management decisions. Negative results must be combined with clinical observations, patient history, and epidemiological information. The expected result is Negative.  Fact Sheet for Patients: SugarRoll.be  Fact Sheet for Healthcare Providers: https://www.woods-.com/  This test is not yet approved or cleared by the [place] FDA and  has been authorized for detection and/or diagnosis of SARS-CoV-2 by FDA under an Emergency Use Authorization (EUA). This EUA will remain  in effect (meaning this test can be used) for the duration  of the COVID-19 declaration under Se ction 564(b)(1) of the Act, 21 U.S.C. section 360bbb-3(b)(1), unless the authorization is terminated or revoked sooner.  Performed at [facility], [place] [address], [place], [place] [address]   Surgical pcr screen     Status: Abnormal   Collection Time: 12/10/20  2:40 PM   Specimen: Nasal Mucosa; Nasal Swab  Result Value Ref Range Status   MRSA, PCR NEGATIVE NEGATIVE Final   Staphylococcus aureus POSITIVE (A) NEGATIVE Final    Comment: (NOTE) The Xpert SA Assay (FDA approved for NASAL specimens in patients 59 years of age and older), is one component of a comprehensive surveillance program. It is not intended to diagnose infection nor to guide or monitor treatment. Performed at [facility], [place] [address] [place], [place] [address]           Radiology Studies: DG CHEST PORT 1 VIEW  Result Date: 12/14/2020 CLINICAL DATA:   Follow-up pneumothorax post chest tube placement. EXAM: PORTABLE CHEST 1 VIEW COMPARISON:  Radiographs 12/13/2020 and 12/12/2020. FINDINGS: 0629 hours. Right chest tube remains in place. No residual pneumothorax identified. There is a small amount of pleural fluid adjacent to the right lung apex. Postsurgical changes are present the right lung apex, and there is residual mild soft tissue emphysema inferolaterally in the right chest wall. The lungs are otherwise clear. The heart size and mediastinal contours are stable. IMPRESSION: 1. No evidence of residual right-sided pneumothorax. Soft tissue emphysema remains in the right chest wall near the chest tube insertion. 2. Small amount of right apical pleural fluid with adjacent postsurgical changes. Electronically Signed   By: Richardean Sale M.D.   On: 12/14/2020 09:23   DG Chest Port 1 View  Result Date: 12/13/2020 CLINICAL DATA:  Shortness of breath and chest pain. EXAM: PORTABLE CHEST 1 VIEW COMPARISON:  December 12, 2020 FINDINGS: Right apical chest tube is in place. The small right hydropneumothorax in the apex is unchanged. The left lung is clear. The mediastinal contour and cardiac silhouette are normal. Osseous structures normal. IMPRESSION: Right apical chest tube is in place. The small right hydropneumothorax in the apex is unchanged. Electronically Signed   By: Abelardo Diesel M.D.   On: 12/13/2020 09:10        Scheduled Meds:  acetaminophen  1,000 mg Oral Q6H   Or   acetaminophen (TYLENOL) oral liquid 160 mg/5 mL  1,000 mg Oral Q6H   bisacodyl  10 mg Oral Daily   Chlorhexidine Gluconate Cloth  6 each Topical Q0600   enoxaparin (LOVENOX) injection  40 mg Subcutaneous Daily   insulin aspart  0-5 Units Subcutaneous QHS   insulin aspart  0-9 Units Subcutaneous TID WC   insulin aspart  5 Units Subcutaneous TID WC   insulin glargine-yfgn  30 Units Subcutaneous QHS   lidocaine  1 patch Transdermal Q24H   mupirocin ointment  1 application Nasal  BID   risperiDONE  3 mg Oral Daily   senna-docusate  1 tablet Oral QHS   Continuous Infusions:  cefTRIAXone (ROCEPHIN)  IV 1 g (12/13/20 1713)     LOS: 9 days    Time spent: 35 min    Georgette Shell, MD 12/14/2020, 2:19 PM

## 2020-12-14 NOTE — Progress Notes (Signed)
Chest tube removed . Pt tolerated well. Will; continue to monitor   Phoebe Sharps, RN

## 2020-12-14 NOTE — Progress Notes (Signed)
Pt ambulated x 470 feet around unit without assist pt tolerated well

## 2020-12-15 ENCOUNTER — Inpatient Hospital Stay (HOSPITAL_COMMUNITY): Payer: BC Managed Care – PPO

## 2020-12-15 DIAGNOSIS — J9311 Primary spontaneous pneumothorax: Secondary | ICD-10-CM | POA: Diagnosis not present

## 2020-12-15 LAB — HEMOGLOBIN AND HEMATOCRIT, BLOOD
HCT: 37.6 % — ABNORMAL LOW (ref 39.0–52.0)
Hemoglobin: 12.1 g/dL — ABNORMAL LOW (ref 13.0–17.0)

## 2020-12-15 LAB — GLUCOSE, CAPILLARY
Glucose-Capillary: 199 mg/dL — ABNORMAL HIGH (ref 70–99)
Glucose-Capillary: 189 mg/dL — ABNORMAL HIGH (ref 70–99)
Glucose-Capillary: 234 mg/dL — ABNORMAL HIGH (ref 70–99)
Glucose-Capillary: 269 mg/dL — ABNORMAL HIGH (ref 70–99)

## 2020-12-15 MED ORDER — INSULIN GLARGINE-YFGN 100 UNIT/ML ~~LOC~~ SOLN
35.0000 [IU] | Freq: Every day | SUBCUTANEOUS | Status: DC
  Administered 2020-12-15 – 2020-12-19 (×5): 35 [IU] via SUBCUTANEOUS
  Filled 2020-12-15 (×6): qty 0.35

## 2020-12-15 NOTE — Progress Notes (Signed)
      [address]       [place] [address]             [phone]      BRIEF PROCEDURE NOTE   INDICATION: Large right recurrent pneumothorax PROCEDURE: placement of right sided 14 f pigtail catheter using standard sterile technique  Anesthesia: 2 mg IV Morphine, 5 cc 1 % local lidocaine  Prep: hibiclens  Complications: none  Patient tolerated procedure well  CXR - pending     John Giovanni, PA-C

## 2020-12-15 NOTE — Progress Notes (Addendum)
      [address]       [place] [address]             [phone]      5 Days Post-Op Procedure(s) (LRB): XI ROBOTIC ASSISTED THORASCOPY-WEDGE RESECTION (Right) APICAL PLEURECTOMY (Right) MECHANICAL PLEURADESIS (Right) INTERCOSTAL NERVE BLOCK (Right) Subjective: Feels okay this morning. His short-term disability paperwork is at our office being completed. He will need to pick this up if he needs it before his post-op appointment.   Objective: Vital signs in last 24 hours: Temp:  [97.7 F (36.5 C)-98 F (36.7 C)] 97.7 F (36.5 C) (10/11 0700) Pulse Rate:  [70-83] 70 (10/11 0700) Cardiac Rhythm: Normal sinus rhythm (10/10 1945) Resp:  [14-18] 14 (10/11 0700) BP: (108-122)/(73-91) 121/86 (10/11 0700) SpO2:  [98 %-100 %] 98 % (10/11 0700) Weight:  [63.2 kg] 63.2 kg (10/11 0401)     Intake/Output from previous day: 10/10 0701 - 10/11 0700 In: 480 [P.O.:480] Out: -  Intake/Output this shift: No intake/output data recorded.  General appearance: alert, cooperative, and no distress Heart: regular rate and rhythm, S1, S2 normal, no murmur, click, rub or gallop Lungs: clear to auscultation bilaterally Abdomen: soft, non-tender; bowel sounds normal; no masses,  no organomegaly Extremities: extremities normal, atraumatic, no cyanosis or edema Wound: clean and dry  Lab Results: Recent Labs    12/14/20 0913 12/15/20 0339  HGB 12.2* 12.1*  HCT 39.2 37.6*   BMET: No results for input(s): NA, K, CL, CO2, GLUCOSE, BUN, CREATININE, CALCIUM in the last 72 hours.  PT/INR: No results for input(s): LABPROT, INR in the last 72 hours. ABG    Component Value Date/Time   PHART 7.336 (L) 04/30/2014 1215   HCO3 28.9 (H) 09/16/2018 1338   TCO2 11.3 04/30/2014 1215   ACIDBASEDEF 14.5 (H) 11/09/2016 1745   O2SAT 90.7 09/16/2018 1338   CBG (last 3)  Recent Labs    12/14/20 1641 12/14/20 2100 12/15/20 0659  GLUCAP 159* 313* 199*    Assessment/Plan: S/P  Procedure(s) (LRB): XI ROBOTIC ASSISTED THORASCOPY-WEDGE RESECTION (Right) APICAL PLEURECTOMY (Right) MECHANICAL PLEURADESIS (Right) INTERCOSTAL NERVE BLOCK (Right)  CV- ST up to 120s this morning, BP well controlled Pulm-chest tube removed yesterday without issue.   CXR showing apical space that is expected following large wedge resection. Encouraging pulmonary hygiene, IS.   3.   Endo- H/O type 1 DM. Mgt per primary team. 4. H and H has been stable   Plan: discharge today if okay with Dr. Kipp Brood. He will follow-up in 1 week.    LOS: 10 days    Elgie Collard 12/15/2020   Agree with above CXR stable Please call with questions  Lajuana Matte

## 2020-12-15 NOTE — Progress Notes (Signed)
PROGRESS NOTE    Pedro [name]  NFA:[id number] DOB: 1995/08/03 DOA: 12/05/2020 PCP: Neale Burly, MD   Brief Narrative: Pedro [name] is a 25 y.o. male with a PMH significant for type 1 diabetes, polysubstance use. They presented from home to the urgent care and then ED on 12/05/2020 with chest pain and difficulty breathing, nausea, lightheadedness x several days. In the ED, it was found that they had right-sided spontaneous tension pneumothorax. They were treated with chest tube.  Patient was admitted to medicine service for further workup and management of pneumothorax as outlined in detail below. His chest tube was taken out on 12/14/2020 he did well overnight and was getting ready for discharge on 12/15/2020 when he started to develop tachycardia and shortness of breath and a stat chest x-ray showed large right recurrent pneumothorax and discharge was canceled.  Assessment & Plan:   Principal Problem:   Primary spontaneous pneumothorax Active Problems:   DM type 1 (diabetes mellitus, type 1) (HCC)   Hyperglycemia   Chest tube in place   Spontaneous R pneumothorax-chest tube removed 12/14/2020 He is status post robotic assisted thoracoscopy with wedge resection and apical pleurectomy, mechanical pleurodesis and intercostal nerve block.  Plan per CT surgery. Patient with recurrent large right pneumothorax status post pigtail catheter 12/15/2020.  Poorly controlled diabetes mellitus type 1 with hyperglycemia - hgb A1c 10.5 this admission. -Increase semglee to 35 u daily on 12/15/2020. -Increased NovoLog to 5 units 3 times a day before meals and continue SSI CBG (last 3)  Recent Labs    12/14/20 2100 12/15/20 0659 12/15/20 1136  GLUCAP 313* 199* 189*      Substance abuse history- Denies current drug use to admitting physician.  UDS positive for opiates, benzodiazepines, amphetamines, THC.    Continue home risperidone  Constipation he reports he not having a bowel  movement since admission.  We will start stool softeners.  Encouraged him to get out of bed ambulate with the staff.  Estimated body mass index is 19.45 kg/m as calculated from the following:   Height as of this encounter: 5\' 11"  (1.803 m).   Weight as of this encounter: 63.2 kg.  DVT prophylaxis: NONE Code Status: FULL Family Communication: NONE Disposition Plan:  Status is: Inpatient  Remains inpatient appropriate because:IV treatments appropriate due to intensity of illness or inability to take PO  Dispo: The patient is from: Home              Anticipated d/c is to: Home              Patient currently is not medically stable to d/c.   Difficult to place patient NO   Consultants: CT  Procedures: CHEST TUBE Apical pleurectomy mechanical pleurodesis intercostal nerve block and robotic assisted thoracic [place] P with wedge resection on 12/10/2020.  Antimicrobials:none  Subjective:   Patient was getting ready to go home when he developed shortness of breath and tachycardia and a stat chest x-ray showed recurrent large right pneumothorax. Objective: Vitals:   12/15/20 1300 12/15/20 1315 12/15/20 1335 12/15/20 1340  BP:      Pulse:      Resp:   (!) 21 18  Temp:      TempSrc:      SpO2: 96% 96% 100% 100%  Weight:      Height:        Intake/Output Summary (Last 24 hours) at 12/15/2020 1624 Last data filed at 12/15/2020 1400 Gross per 24 hour  Intake 0 ml  Output 0 ml  Net 0 ml    Filed Weights   12/05/20 1459 12/05/20 2250 12/15/20 0401  Weight: 63.5 kg 61.2 kg 63.2 kg    Examination:  General exam: Appears calm and comfortable  Respiratory system decreased bs to right  to auscultation. Respiratory effort normal.  Chest tube in place right thorax Cardiovascular system: S1 & S2 heard, RRR. No JVD, murmurs, rubs, gallops or clicks. No pedal edema. Gastrointestinal system: Abdomen is nondistended, soft and nontender. No organomegaly or masses felt. Normal bowel  sounds heard. Central nervous system: Alert and oriented. No focal neurological deficits. Extremities: Symmetric 5 x 5 power. Skin: No rashes, lesions or ulcers Psychiatry: Judgement and insight appear normal. Mood & affect appropriate.     Data Reviewed: I have personally reviewed following labs and imaging studies  CBC: Recent Labs  Lab 12/09/20 0225 12/11/20 0147 12/12/20 0135 12/14/20 0913 12/15/20 0339  WBC 17.4* 17.5* 11.1*  --   --   HGB 14.3 14.8 13.8 12.2* 12.1*  HCT 44.7 45.0 42.4 39.2 37.6*  MCV 92.0 91.6 91.2  --   --   PLT 322 307 307  --   --     Basic Metabolic Panel: Recent Labs  Lab 12/11/20 0147 12/12/20 0135  NA 133* 133*  K 4.6 4.4  CL 99 98  CO2 23 27  GLUCOSE 240* 208*  BUN 28* 36*  CREATININE 1.12 1.22  CALCIUM 9.0 9.0    GFR: Estimated Creatinine Clearance: 81.3 mL/min (by C-G formula based on SCr of 1.22 mg/dL). Liver Function Tests: Recent Labs  Lab 12/12/20 0135  AST 15  ALT 8  ALKPHOS 53  BILITOT 0.8  PROT 5.4*  ALBUMIN 2.7*    No results for input(s): LIPASE, AMYLASE in the last 168 hours. No results for input(s): AMMONIA in the last 168 hours. Coagulation Profile: No results for input(s): INR, PROTIME in the last 168 hours. Cardiac Enzymes: No results for input(s): CKTOTAL, CKMB, CKMBINDEX, TROPONINI in the last 168 hours. BNP (last 3 results) No results for input(s): PROBNP in the last 8760 hours. HbA1C: No results for input(s): HGBA1C in the last 72 hours. CBG: Recent Labs  Lab 12/14/20 1148 12/14/20 1641 12/14/20 2100 12/15/20 0659 12/15/20 1136  GLUCAP 205* 159* 313* 199* 189*    Lipid Profile: No results for input(s): CHOL, HDL, LDLCALC, TRIG, CHOLHDL, LDLDIRECT in the last 72 hours. Thyroid Function Tests: No results for input(s): TSH, T4TOTAL, FREET4, T3FREE, THYROIDAB in the last 72 hours. Anemia Panel: No results for input(s): VITAMINB12, FOLATE, FERRITIN, TIBC, IRON, RETICCTPCT in the last 72  hours. Sepsis Labs: No results for input(s): PROCALCITON, LATICACIDVEN in the last 168 hours.  Recent Results (from the past 240 hour(s))  SARS CORONAVIRUS 2 (TAT 6-24 HRS) Nasopharyngeal Nasopharyngeal Swab     Status: None   Collection Time: 12/05/20  4:30 PM   Specimen: Nasopharyngeal Swab  Result Value Ref Range Status   SARS Coronavirus 2 NEGATIVE NEGATIVE Final    Comment: (NOTE) SARS-CoV-2 target nucleic acids are NOT DETECTED.  The SARS-CoV-2 RNA is generally detectable in upper and lower respiratory specimens during the acute phase of infection. Negative results do not preclude SARS-CoV-2 infection, do not rule out co-infections with other pathogens, and should not be used as the sole basis for treatment or other patient management decisions. Negative results must be combined with clinical observations, patient history, and epidemiological information. The expected result is Negative.  Fact Sheet  for Patients: SugarRoll.be  Fact Sheet for Healthcare Providers: https://www.woods-.com/  This test is not yet approved or cleared by the [place] FDA and  has been authorized for detection and/or diagnosis of SARS-CoV-2 by FDA under an Emergency Use Authorization (EUA). This EUA will remain  in effect (meaning this test can be used) for the duration of the COVID-19 declaration under Se ction 564(b)(1) of the Act, 21 U.S.C. section 360bbb-3(b)(1), unless the authorization is terminated or revoked sooner.  Performed at [facility], [place] [address] [place], [place] [address]   Surgical pcr screen     Status: Abnormal   Collection Time: 12/10/20  2:40 PM   Specimen: Nasal Mucosa; Nasal Swab  Result Value Ref Range Status   MRSA, PCR NEGATIVE NEGATIVE Final   Staphylococcus aureus POSITIVE (A) NEGATIVE Final    Comment: (NOTE) The Xpert SA Assay (FDA approved for NASAL specimens in patients 81 years of age and  older), is one component of a comprehensive surveillance program. It is not intended to diagnose infection nor to guide or monitor treatment. Performed at [facility], [place] [address], [place], [place] [address]           Radiology Studies: DG Chest Port 1 View  Result Date: 12/15/2020 CLINICAL DATA:  Status post chest tube placement. EXAM: PORTABLE CHEST 1 VIEW COMPARISON:  December 15, 2020 FINDINGS: Interval right-sided chest tube placement is noted with its distal tip seen overlying the medial aspect of the upper right lung. Ill-defined surgical sutures are also seen within this region. Expansion of the right lung is seen with a 3 mm residual lateral right pneumothorax noted. Stable right apical pleural thickening is present. The heart size and mediastinal contours are within normal limits. Moderate to marked severity subcutaneous emphysema is seen along the lateral aspect of the right chest wall. The visualized skeletal structures are unremarkable. IMPRESSION: Interval right-sided chest tube placement with a 3 mm residual lateral right pneumothorax. Electronically Signed   By: Virgina Norfolk M.D.   On: 12/15/2020 15:30   DG Chest Port 1 View  Result Date: 12/15/2020 CLINICAL DATA:  Follow-up pneumothorax. EXAM: PORTABLE CHEST 1 VIEW COMPARISON:  Chest CT 12/09/2020 and chest x-ray 12/14/2020 FINDINGS: Interval development of a large recurrent right-sided pneumothorax estimated at 80%. No shift of the heart or mediastinum. The left lung remains clear. Progressive subcutaneous emphysema on the right side. IMPRESSION: Large recurrent right-sided pneumothorax estimated at 80%. These results will be called to the ordering clinician or representative by the Radiologist Assistant, and communication documented in the PACS or [employer]. Electronically Signed   By: Marijo Sanes M.D.   On: 12/15/2020 13:04   DG Chest Port 1 View  Result Date: 12/14/2020 CLINICAL DATA:   Pneumothorax EXAM: PORTABLE CHEST 1 VIEW COMPARISON:  12/14/2020 at 6:29 a.m. FINDINGS: Single frontal view of the chest demonstrates interval removal of the right-sided chest tube. There is a trace recurrent right pneumothorax, with pleural separation in the mid right chest measuring approximately 4 mm. Stable right apical pleural fluid and postsurgical changes within the right upper lobe. Left chest is clear. Cardiac silhouette is unremarkable. No acute bony abnormalities. Persistent subcutaneous gas right lateral chest wall. IMPRESSION: 1. Trace recurrent right sided pneumothorax after right chest tube removal. Volume estimated far less than 5%. 2. Persistent postsurgical changes at the right apex, with minimal right apical pleural fluid. These results will be called to the ordering clinician or representative by the Radiologist  Environmental consultant, and communication documented in the PACS or [employer]. Electronically Signed   By: Randa Ngo M.D.   On: 12/14/2020 14:59   DG CHEST PORT 1 VIEW  Result Date: 12/14/2020 CLINICAL DATA:  Follow-up pneumothorax post chest tube placement. EXAM: PORTABLE CHEST 1 VIEW COMPARISON:  Radiographs 12/13/2020 and 12/12/2020. FINDINGS: 0629 hours. Right chest tube remains in place. No residual pneumothorax identified. There is a small amount of pleural fluid adjacent to the right lung apex. Postsurgical changes are present the right lung apex, and there is residual mild soft tissue emphysema inferolaterally in the right chest wall. The lungs are otherwise clear. The heart size and mediastinal contours are stable. IMPRESSION: 1. No evidence of residual right-sided pneumothorax. Soft tissue emphysema remains in the right chest wall near the chest tube insertion. 2. Small amount of right apical pleural fluid with adjacent postsurgical changes. Electronically Signed   By: Richardean Sale M.D.   On: 12/14/2020 09:23        Scheduled Meds:  acetaminophen  1,000 mg Oral Q6H    Or   acetaminophen (TYLENOL) oral liquid 160 mg/5 mL  1,000 mg Oral Q6H   bisacodyl  10 mg Oral Daily   Chlorhexidine Gluconate Cloth  6 each Topical Q0600   enoxaparin (LOVENOX) injection  40 mg Subcutaneous Daily   insulin aspart  0-5 Units Subcutaneous QHS   insulin aspart  0-9 Units Subcutaneous TID WC   insulin aspart  5 Units Subcutaneous TID WC   insulin glargine-yfgn  30 Units Subcutaneous QHS   lidocaine  1 patch Transdermal Q24H   risperiDONE  3 mg Oral Daily   senna-docusate  1 tablet Oral QHS   Continuous Infusions:  cefTRIAXone (ROCEPHIN)  IV 1 g (12/14/20 1635)     LOS: 10 days    Time spent: 35 min    Georgette Shell, MD 12/15/2020, 4:24 PM

## 2020-12-15 NOTE — Discharge Instructions (Addendum)
Discharge Instructions:  1.Keep dressing in place for 24 hours then it is okay to leave open to air. You should clean incisions with soap and water. It is okay to take a shower tomorrow. If your incisions become red or develop any drainage please call our office at [phone] 2. Chest tube suture will be removed in the office on Friday during your appointment.  3. Avoid heavy lifting  4. We will discuss return to work timing in the office.  5. No Driving until cleared by our office and you are no longer using narcotic pain medications 6. Fever of 101.5 for at least 24 hours with no source, please contact our office at [facility] Patient Instructions:  If the tube becomes disconnected, reconnect it immediately and tape it securely. To reconnect the tube: Pinch the chest tube with your fingers to close the chest tube until you can re attach it. If you are able, clean the ends of the chest tube and drain with an alcohol swab before reattaching.   If the tube becomes disconnected AND you cannot put it back together, go to closest Emergency Department  Please do NOT allow the mini express chamber to become full. To empty the fluid from the chamber: First, wash your hands with soap and water. Use a Luer lock syringe (provided by hospital or home health, if arranged) to attach to the front port (twist Luer lock syringe clock wise to to tighten) at the bottom of the mini express. Pull the syringe plunger back, unscrew the syringe and put fluid into the toilet. You may repeat as necessary. If it becomes difficult to empty the fluid in the mini express, squirt water through the port (where syringe attaches to) to flush out the blockage. If this does not work, call the office .  Try to keep mini express upright and below the level of your heart. Also, check periodically that there are no kinks in the tubing.   Changing the chest tube dressing: Please change the dressing at  least every other day or if it becomes wet. You will be taught how to change the dressing before you are discharged from the hospital or home health will be arranged to do it for you.   If the chest falls out or gets pulled out: Place a piece of gauze over the site and cover the gauze with tape. Contact our office ASAP [phone]) If you have chest pain or sudden onset of shortness of breath, go to the nearest emergency room     Local Endocrinologists [facility] [phone]) Dr. Philemon Kingdom Dr. Elayne Snare [facility] [phone]) Dr. Delrae Rend [facility] [phone]) Dr. Jacelyn Pi   **** Dr. Anda Kraft [facility] [phone]) Dr. Reynold Bowen  **** [facility] [phone]) [[place] office]  [phone]) Shari Prows office] Dr. Lavone Orn Dr. Mee Hives [facility]) [phone]) Autumn Barbaraann Barthel Ronnald Ramp), PA Dr. Amalia Greenhouse Dr. Marsh Dolly. [facility] [phone]) Dr. Glade Lloyd [facility] [phone]) Dr. Orville Govern Dr. Lelon Huh Dr. Lavone Nian, FNP Dr. Carolynn Serve. [place] [phone])

## 2020-12-16 ENCOUNTER — Inpatient Hospital Stay (HOSPITAL_COMMUNITY): Payer: BC Managed Care – PPO

## 2020-12-16 ENCOUNTER — Telehealth: Payer: Self-pay

## 2020-12-16 DIAGNOSIS — J9311 Primary spontaneous pneumothorax: Secondary | ICD-10-CM | POA: Diagnosis not present

## 2020-12-16 LAB — GLUCOSE, CAPILLARY
Glucose-Capillary: 205 mg/dL — ABNORMAL HIGH (ref 70–99)
Glucose-Capillary: 117 mg/dL — ABNORMAL HIGH (ref 70–99)
Glucose-Capillary: 164 mg/dL — ABNORMAL HIGH (ref 70–99)
Glucose-Capillary: 138 mg/dL — ABNORMAL HIGH (ref 70–99)

## 2020-12-16 NOTE — Progress Notes (Signed)
Mobility Specialist Progress Note    12/16/20 1115  Mobility  Activity Ambulated in hall  Level of Assistance Independent  Assistive Device None  Distance Ambulated (ft) 240 ft  Mobility Ambulated independently in hallway  Mobility Response Tolerated well  Mobility performed by Mobility specialist  $Mobility charge 1 Mobility   Pre-Mobility: 96 HR, 112/76 BP During Mobility: 142 HR Post-Mobility: 102 HR  Pt received in bed and agreeable. C/o ribs hurting but otherwise no complaints during. Returned to bed with call bell in reach.   Hildred Alamin Mobility Specialist  Mobility Specialist Phone: [phone]

## 2020-12-16 NOTE — Progress Notes (Addendum)
      [address]       [place] [address]             [phone]      6 Days Post-Op Procedure(s) (LRB): XI ROBOTIC ASSISTED THORASCOPY-WEDGE RESECTION (Right) APICAL PLEURECTOMY (Right) MECHANICAL PLEURADESIS (Right) INTERCOSTAL NERVE BLOCK (Right) Subjective: Feels okay, pain is well controlled. Sleepy this morning due to yesterday's events.   Objective: Vital signs in last 24 hours: Temp:  [98 F (36.7 C)-98.3 F (36.8 C)] 98 F (36.7 C) (10/12 0428) Pulse Rate:  [86-116] 86 (10/12 0428) Cardiac Rhythm: Normal sinus rhythm (10/11 2105) Resp:  [12-22] 16 (10/12 0428) BP: (114-123)/(76-84) 118/82 (10/12 0428) SpO2:  [96 %-100 %] 99 % (10/12 0428)     Intake/Output from previous day: 10/11 0701 - 10/12 0700 In: 240 [P.O.:240] Out: 650 [Urine:650] Intake/Output this shift: No intake/output data recorded.  General appearance: alert, cooperative, and no distress Heart: regular rate and rhythm, S1, S2 normal, no murmur, click, rub or gallop Lungs: clear to auscultation bilaterally Abdomen: soft, non-tender; bowel sounds normal; no masses,  no organomegaly Extremities: extremities normal, atraumatic, no cyanosis or edema Wound: clean and dry  Lab Results: Recent Labs    12/14/20 0913 12/15/20 0339  HGB 12.2* 12.1*  HCT 39.2 37.6*   BMET: No results for input(s): NA, K, CL, CO2, GLUCOSE, BUN, CREATININE, CALCIUM in the last 72 hours.  PT/INR: No results for input(s): LABPROT, INR in the last 72 hours. ABG    Component Value Date/Time   PHART 7.336 (L) 04/30/2014 1215   HCO3 28.9 (H) 09/16/2018 1338   TCO2 11.3 04/30/2014 1215   ACIDBASEDEF 14.5 (H) 11/09/2016 1745   O2SAT 90.7 09/16/2018 1338   CBG (last 3)  Recent Labs    12/15/20 1707 12/15/20 2143 12/16/20 0634  GLUCAP 234* 269* 205*    Assessment/Plan: S/P Procedure(s) (LRB): XI ROBOTIC ASSISTED THORASCOPY-WEDGE RESECTION (Right) APICAL PLEURECTOMY (Right) MECHANICAL  PLEURADESIS (Right) INTERCOSTAL NERVE BLOCK (Right)  CV- NSR in the 80s this morning, BP well controlled Pulm-pigtail catheter placed yesterday. CXR this mornig shows total re-expansion of the right lung. Subq air has improved. Encouraging pulmonary hygiene, IS.   3.   Endo- H/O type 1 DM. Mgt per primary team. 4. H and H has been stable   Plan: Chest tube is on suction this morning without an air leak. May be able to be connected to a mini express and go home today. Encouraged ambulation and use of incentive spirometer. He already has an appointment Friday with HL.   CXR read this morning showed: Small right-sided pneumothorax, increased in size in comparison to yesterday's exam. Unchanged position of right apical chest tube. Mini express ordered and will be in the patient's room but will leave to suction for now.    LOS: 11 days    Elgie Collard 12/16/2020

## 2020-12-16 NOTE — Progress Notes (Signed)
Mobility Specialist Progress Note    12/16/20 1709  Mobility  Activity Ambulated in hall  Level of Assistance Independent  Assistive Device None  Distance Ambulated (ft) 485 ft  Mobility Ambulated independently in hallway  Mobility Response Tolerated well  Mobility performed by Mobility specialist  $Mobility charge 1 Mobility   Pre-Mobility: 88 HR, 115/88 BP During Mobility: 122 HR  Pt received in bed and agreeable. Rib pain 5/10 but no complaints otherwise. Returned to sink with NT present.   Hildred Alamin Mobility Specialist  Mobility Specialist Phone: [phone]

## 2020-12-16 NOTE — Progress Notes (Addendum)
Nursing DC note  Patient alert and oriented. Wife at the bedside made aware of dc instructions. Both verbalized understanding. Piv to left hand dcd. No bleeding. Ccmd notified of patients dc order. All belongings given to patient. Wife will take patient home via car. This RN is taking patient to the main entrance via wheelchair

## 2020-12-16 NOTE — Telephone Encounter (Signed)
FMLA/STD form completed and faxed to [employer]: April Taylor/[employer] [phone]) Beginning leave 12/05/20 through approx 02/08/21. Patient will pick up forms at front desk

## 2020-12-16 NOTE — Progress Notes (Signed)
PROGRESS NOTE    SAGAR TENGAN  WCB:[id number] DOB: 25-Nov-1995 DOA: 12/05/2020 PCP: Neale Burly, MD   Brief Narrative: Pedro [name] is a 25 y.o. male with a PMH significant for type 1 diabetes, polysubstance use. Presented from home to the urgent care and then ED on 12/05/2020 with chest pain and difficulty breathing, nausea, lightheadedness x several days. In the ED, it was found that they had right-sided spontaneous tension pneumothorax - chest tube was placed - thoracoscopy/wedge resection and pleurectomy recurrent large right pneumothorax noted 12/15/20 requiring replacement of chest tube.  Assessment & Plan:  Spontaneous R pneumothorax Chest tube removed 12/14/2020 He is status post robotic assisted thoracoscopy with wedge resection and apical pleurectomy, mechanical pleurodesis and intercostal nerve block.  Patient with recurrent large right pneumothorax status post pigtail catheter 12/15/2020. Plan per CT surgery -continue to monitor for next 24 hours, if pneumothorax resuming will likely transition home with chest tube and mini express with further outpatient follow-up with CT surgery  Poorly controlled diabetes mellitus type 1 with hyperglycemia - hgb A1c 10.5 this admission. -Continue semglee 35 units -Continue NovoLog to 5 units 3 times a day before meals and continue SSI CBG (last 3)  Recent Labs    12/15/20 1707 12/15/20 2143 12/16/20 0634  GLUCAP 234* 269* 205*      Substance abuse history- Denies current drug use to admitting physician.  UDS positive for opiates, benzodiazepines, amphetamines, THC.    Continue home risperidone  Constipation he reports he not having a bowel movement since admission.  We will start stool softeners.  Encouraged him to get out of bed ambulate with the staff.  Estimated body mass index is 19.45 kg/m as calculated from the following:   Height as of this encounter: 5\' 11"  (1.803 m).   Weight as of this encounter: 63.2  kg.  DVT prophylaxis: NONE Code Status: FULL Family Communication: NONE Disposition Plan:  Status is: Inpatient  Remains inpatient appropriate because:IV treatments appropriate due to intensity of illness or inability to take PO  Dispo: The patient is from: Home              Anticipated d/c is to: Home              Patient currently is not medically stable to d/c.   Difficult to place patient NO   Consultants: CT  Procedures:  CHEST TUBE Apical pleurectomy mechanical pleurodesis intercostal nerve block and robotic assisted thoracic Koska P with wedge resection on 12/10/2020.  Antimicrobials:none  Subjective: No acute issues or events overnight, late yesterday patient had notably worsening pneumothorax on repeat chest tube placement otherwise pain well controlled denies nausea vomiting diarrhea constipation headache fevers chills, chest pain ongoing at site of chest tube insertion  Objective: Vitals:   12/15/20 1709 12/15/20 2009 12/16/20 0023 12/16/20 0428  BP: 118/78 114/84 114/76 118/82  Pulse: 89 92 90 86  Resp: 16 12 15 16   Temp: 98 F (36.7 C) 98 F (36.7 C) 98.3 F (36.8 C) 98 F (36.7 C)  TempSrc: Oral Oral Oral Oral  SpO2: 98% 98% 98% 99%  Weight:      Height:        Intake/Output Summary (Last 24 hours) at 12/16/2020 0824 Last data filed at 12/16/2020 0430 Gross per 24 hour  Intake 240 ml  Output 650 ml  Net -410 ml    Filed Weights   12/05/20 1459 12/05/20 2250 12/15/20 0401  Weight: 63.5 kg 61.2 kg 63.2  kg    Examination:  General exam: Appears calm and comfortable  Respiratory system decreased bs to right  to auscultation. Respiratory effort normal.  Chest tube in place right thorax Cardiovascular system: S1 & S2 heard, RRR. No JVD, murmurs, rubs, gallops or clicks. No pedal edema. Gastrointestinal system: Abdomen is nondistended, soft and nontender. No organomegaly or masses felt. Normal bowel sounds heard. Central nervous system: Alert and  oriented. No focal neurological deficits. Extremities: Symmetric 5 x 5 power. Skin: No rashes, lesions or ulcers Psychiatry: Judgement and insight appear normal. Mood & affect appropriate.     Data Reviewed: I have personally reviewed following labs and imaging studies  CBC: Recent Labs  Lab 12/11/20 0147 12/12/20 0135 12/14/20 0913 12/15/20 0339  WBC 17.5* 11.1*  --   --   HGB 14.8 13.8 12.2* 12.1*  HCT 45.0 42.4 39.2 37.6*  MCV 91.6 91.2  --   --   PLT 307 307  --   --     Basic Metabolic Panel: Recent Labs  Lab 12/11/20 0147 12/12/20 0135  NA 133* 133*  K 4.6 4.4  CL 99 98  CO2 23 27  GLUCOSE 240* 208*  BUN 28* 36*  CREATININE 1.12 1.22  CALCIUM 9.0 9.0    GFR: Estimated Creatinine Clearance: 81.3 mL/min (by C-G formula based on SCr of 1.22 mg/dL). Liver Function Tests: Recent Labs  Lab 12/12/20 0135  AST 15  ALT 8  ALKPHOS 53  BILITOT 0.8  PROT 5.4*  ALBUMIN 2.7*    No results for input(s): LIPASE, AMYLASE in the last 168 hours. No results for input(s): AMMONIA in the last 168 hours. Coagulation Profile: No results for input(s): INR, PROTIME in the last 168 hours. Cardiac Enzymes: No results for input(s): CKTOTAL, CKMB, CKMBINDEX, TROPONINI in the last 168 hours. BNP (last 3 results) No results for input(s): PROBNP in the last 8760 hours. HbA1C: No results for input(s): HGBA1C in the last 72 hours. CBG: Recent Labs  Lab 12/15/20 0659 12/15/20 1136 12/15/20 1707 12/15/20 2143 12/16/20 0634  GLUCAP 199* 189* 234* 269* 205*    Lipid Profile: No results for input(s): CHOL, HDL, LDLCALC, TRIG, CHOLHDL, LDLDIRECT in the last 72 hours. Thyroid Function Tests: No results for input(s): TSH, T4TOTAL, FREET4, T3FREE, THYROIDAB in the last 72 hours. Anemia Panel: No results for input(s): VITAMINB12, FOLATE, FERRITIN, TIBC, IRON, RETICCTPCT in the last 72 hours. Sepsis Labs: No results for input(s): PROCALCITON, LATICACIDVEN in the last 168  hours.  Recent Results (from the past 240 hour(s))  Surgical pcr screen     Status: Abnormal   Collection Time: 12/10/20  2:40 PM   Specimen: Nasal Mucosa; Nasal Swab  Result Value Ref Range Status   MRSA, PCR NEGATIVE NEGATIVE Final   Staphylococcus aureus POSITIVE (A) NEGATIVE Final    Comment: (NOTE) The Xpert SA Assay (FDA approved for NASAL specimens in patients 25 years of age and older), is one component of a comprehensive surveillance program. It is not intended to diagnose infection nor to guide or monitor treatment. Performed at [facility], [place] [address], [place], [place] [address]           Radiology Studies: DG Chest Port 1 View  Result Date: 12/15/2020 CLINICAL DATA:  Status post chest tube placement. EXAM: PORTABLE CHEST 1 VIEW COMPARISON:  December 15, 2020 FINDINGS: Interval right-sided chest tube placement is noted with its distal tip seen overlying the medial aspect of the upper right lung. Ill-defined  surgical sutures are also seen within this region. Expansion of the right lung is seen with a 3 mm residual lateral right pneumothorax noted. Stable right apical pleural thickening is present. The heart size and mediastinal contours are within normal limits. Moderate to marked severity subcutaneous emphysema is seen along the lateral aspect of the right chest wall. The visualized skeletal structures are unremarkable. IMPRESSION: Interval right-sided chest tube placement with a 3 mm residual lateral right pneumothorax. Electronically Signed   By: Virgina Norfolk M.D.   On: 12/15/2020 15:30   DG Chest Port 1 View  Result Date: 12/15/2020 CLINICAL DATA:  Follow-up pneumothorax. EXAM: PORTABLE CHEST 1 VIEW COMPARISON:  Chest CT 12/09/2020 and chest x-ray 12/14/2020 FINDINGS: Interval development of a large recurrent right-sided pneumothorax estimated at 80%. No shift of the heart or mediastinum. The left lung remains clear. Progressive subcutaneous emphysema on  the right side. IMPRESSION: Large recurrent right-sided pneumothorax estimated at 80%. These results will be called to the ordering clinician or representative by the Radiologist Assistant, and communication documented in the PACS or [employer]. Electronically Signed   By: Marijo Sanes M.D.   On: 12/15/2020 13:04   DG Chest Port 1 View  Result Date: 12/14/2020 CLINICAL DATA:  Pneumothorax EXAM: PORTABLE CHEST 1 VIEW COMPARISON:  12/14/2020 at 6:29 a.m. FINDINGS: Single frontal view of the chest demonstrates interval removal of the right-sided chest tube. There is a trace recurrent right pneumothorax, with pleural separation in the mid right chest measuring approximately 4 mm. Stable right apical pleural fluid and postsurgical changes within the right upper lobe. Left chest is clear. Cardiac silhouette is unremarkable. No acute bony abnormalities. Persistent subcutaneous gas right lateral chest wall. IMPRESSION: 1. Trace recurrent right sided pneumothorax after right chest tube removal. Volume estimated far less than 5%. 2. Persistent postsurgical changes at the right apex, with minimal right apical pleural fluid. These results will be called to the ordering clinician or representative by the Radiologist Assistant, and communication documented in the PACS or [employer]. Electronically Signed   By: Randa Ngo M.D.   On: 12/14/2020 14:59        Scheduled Meds:  bisacodyl  10 mg Oral Daily   enoxaparin (LOVENOX) injection  40 mg Subcutaneous Daily   insulin aspart  0-5 Units Subcutaneous QHS   insulin aspart  0-9 Units Subcutaneous TID WC   insulin aspart  5 Units Subcutaneous TID WC   insulin glargine-yfgn  35 Units Subcutaneous QHS   lidocaine  1 patch Transdermal Q24H   risperiDONE  3 mg Oral Daily   senna-docusate  1 tablet Oral QHS   Continuous Infusions:     LOS: 11 days    Time spent: 35 min    Little Ishikawa, DO 12/16/2020, 8:24 AM

## 2020-12-17 ENCOUNTER — Inpatient Hospital Stay (HOSPITAL_COMMUNITY): Payer: BC Managed Care – PPO

## 2020-12-17 DIAGNOSIS — J9311 Primary spontaneous pneumothorax: Secondary | ICD-10-CM | POA: Diagnosis not present

## 2020-12-17 LAB — GLUCOSE, CAPILLARY
Glucose-Capillary: 74 mg/dL (ref 70–99)
Glucose-Capillary: 226 mg/dL — ABNORMAL HIGH (ref 70–99)
Glucose-Capillary: 218 mg/dL — ABNORMAL HIGH (ref 70–99)
Glucose-Capillary: 148 mg/dL — ABNORMAL HIGH (ref 70–99)

## 2020-12-17 NOTE — Progress Notes (Signed)
Mobility Specialist Progress Note    12/17/20 1246  Mobility  Activity Contraindicated/medical hold   Pt needs to maintain suction and did not want to just do laps around the bed. Encouraged IS use. Left with family member present in room.   Hildred Alamin Mobility Specialist  Mobility Specialist Phone: [phone]

## 2020-12-17 NOTE — Progress Notes (Addendum)
      [address]       ,[place] [address]             [phone]      7 Days Post-Op Procedure(s) (LRB): XI ROBOTIC ASSISTED THORASCOPY-WEDGE RESECTION (Right) APICAL PLEURECTOMY (Right) MECHANICAL PLEURADESIS (Right) INTERCOSTAL NERVE BLOCK (Right) Subjective: Feels okay this morning. No shortness of breath or significant pain.   Objective: Vital signs in last 24 hours: Temp:  [98 F (36.7 C)-98.3 F (36.8 C)] 98.3 F (36.8 C) (10/13 0413) Pulse Rate:  [76-91] 76 (10/13 0413) Cardiac Rhythm: Sinus tachycardia (10/13 0601) Resp:  [13-20] 16 (10/13 0413) BP: (107-117)/(75-88) 117/87 (10/13 0413) SpO2:  [98 %-100 %] 98 % (10/13 0413)     Intake/Output from previous day: 10/12 0701 - 10/13 0700 In: 120 [P.O.:120] Out: 1419 [Urine:1380; Chest Tube:39] Intake/Output this shift: No intake/output data recorded.  General appearance: alert, cooperative, and no distress Heart: regular rate and rhythm, S1, S2 normal, no murmur, click, rub or gallop Lungs: clear to auscultation bilaterally Abdomen: soft, non-tender; bowel sounds normal; no masses,  no organomegaly Extremities: extremities normal, atraumatic, no cyanosis or edema Wound: clean and dry  Lab Results: Recent Labs    12/14/20 0913 12/15/20 0339  HGB 12.2* 12.1*  HCT 39.2 37.6*   BMET: No results for input(s): NA, K, CL, CO2, GLUCOSE, BUN, CREATININE, CALCIUM in the last 72 hours.  PT/INR: No results for input(s): LABPROT, INR in the last 72 hours. ABG    Component Value Date/Time   PHART 7.336 (L) 04/30/2014 1215   HCO3 28.9 (H) 09/16/2018 1338   TCO2 11.3 04/30/2014 1215   ACIDBASEDEF 14.5 (H) 11/09/2016 1745   O2SAT 90.7 09/16/2018 1338   CBG (last 3)  Recent Labs    12/16/20 1655 12/16/20 2059 12/17/20 0624  GLUCAP 164* 138* 74    Assessment/Plan: S/P Procedure(s) (LRB): XI ROBOTIC ASSISTED THORASCOPY-WEDGE RESECTION (Right) APICAL PLEURECTOMY (Right) MECHANICAL  PLEURADESIS (Right) INTERCOSTAL NERVE BLOCK (Right)  CV- NSR in the 90s this morning, BP well controlled Pulm-pigtail catheter placed 10/11. CXR this mornig shows increased pneumothorax especially in the lateral aspect of the chest. Subq air is about the same. Encouraging pulmonary hygiene, IS.   3.   Endo- H/O type 1 DM. Mgt per primary team. 4. H and H has been stable   Plan: He has been on suction except to ambulate in the halls. No significant SOB or pain. Unsure why his lung keeps dropping. All connections secure and no kinks in the tubing. Will discuss with Dr. Kipp Brood. Mini express is in the room.    LOS: 12 days    Pedro [name] 12/17/2020  Patient did drop her son again after placing to waterseal. Will place back to suction. Okay for ambulation on suction.  Patient needs aggressive pulmonary toilet.  Pedro [name]

## 2020-12-17 NOTE — Progress Notes (Signed)
PROGRESS NOTE    [name]  HFW:[id number] DOB: [birth year] DOA: 12/05/2020 PCP: Neale Burly, MD   Brief Narrative: Pedro [name] is a 25 y.o. male with a PMH significant for type 1 diabetes, polysubstance use. Presented from home to the urgent care and then ED on 12/05/2020 with chest pain and difficulty breathing, nausea, lightheadedness x several days. In the ED, it was found that they had right-sided spontaneous tension pneumothorax - chest tube was placed - thoracoscopy/wedge resection and pleurectomy recurrent large right pneumothorax noted 12/15/20 requiring replacement of chest tube.  Unfortunately over the past 48 hours on 12/16/2020 and 12/17/2020 repeat imaging shows worsening and enlarging pneumothorax, CT surgery continues to monitor and manage the chest tube currently to suction with ultimate plan to discharge patient home once stabilized with chest tube and mini express but given ongoing worsening imaging there is some discussion about need for repeat procedure.  Patient otherwise clinically appears well pain is well controlled and remains without hypoxia ambulating without any difficulty today.  Assessment & Plan:  Spontaneous R pneumothorax Chest tube removed 12/14/2020 He is status post robotic assisted thoracoscopy with wedge resection and apical pleurectomy, mechanical pleurodesis and intercostal nerve block.  Patient with recurrent large right pneumothorax status post pigtail catheter 12/15/2020. Plan per CT surgery -continue to monitor for next 24-48 hours to ensure improving pneumothorax - unfortunately pneumothorax appears to be increasing daily over the past 48 hours, may require repeat procedure - we discussed that if pneumothorax were improving plan would be to transition home with chest tube and mini express with further outpatient follow-up with CT surgery  Poorly controlled diabetes mellitus type 1 with hyperglycemia - hgb A1c 10.5 this  admission. -Continue semglee 35 units -Continue NovoLog to 5 units 3 times a day before meals and continue SSI CBG (last 3)  Recent Labs    12/16/20 1655 12/16/20 2059 12/17/20 0624  GLUCAP 164* 138* 74    Substance abuse history- Denies current drug use to admitting physician.  UDS positive for opiates, benzodiazepines, amphetamines, THC.    Continue home risperidone  Constipation Improving  Estimated body mass index is 19.45 kg/m as calculated from the following:   Height as of this encounter: 5\' 11"  (1.803 m).   Weight as of this encounter: 63.2 kg.  DVT prophylaxis: NONE Code Status: FULL Family Communication: NONE Disposition Plan:  Status is: Inpatient  Remains inpatient appropriate because:IV treatments appropriate due to intensity of illness or inability to take PO  Dispo: The patient is from: Home              Anticipated d/c is to: Home              Patient currently is not medically stable to d/c.   Difficult to place patient NO   Consultants: CT  Procedures:  CHEST TUBE Apical pleurectomy mechanical pleurodesis intercostal nerve block and robotic assisted thoracic Koska P with wedge resection on 12/10/2020.  Antimicrobials:none  Subjective: No acute issues or events overnight, chest pain currently well controlled denies nausea vomiting diarrhea constipation headache fevers chills -somewhat frustrated about ongoing need for chest tube and suction but understands he is unsafe for disposition home.  Objective: Vitals:   12/16/20 1659 12/16/20 1922 12/16/20 2313 12/17/20 0413  BP: 115/88 107/79 116/75 117/87  Pulse: 91  80 76  Resp: 14 13 16 16   Temp: 98.3 F (36.8 C) 98.3 F (36.8 C) 98.3 F (36.8 C) 98.3 F (36.8 C)  TempSrc: Oral Oral Oral Oral  SpO2: 100% 100% 100% 98%  Weight:      Height:        Intake/Output Summary (Last 24 hours) at 12/17/2020 0829 Last data filed at 12/17/2020 0636 Gross per 24 hour  Intake 120 ml  Output 1419 ml   Net -1299 ml    Filed Weights   12/05/20 1459 12/05/20 2250 12/15/20 0401  Weight: 63.5 kg 61.2 kg 63.2 kg    Examination:  General exam: Appears calm and comfortable  Respiratory diminished breath sounds right greater than left. Respiratory effort normal.  Chest tube in place right to suction Cardiovascular system: S1 & S2 heard, RRR. No JVD, murmurs, rubs, gallops or clicks. No pedal edema. Gastrointestinal system: Abdomen is nondistended, soft and nontender. No organomegaly or masses felt. Normal bowel sounds heard. Central nervous system: Alert and oriented. No focal neurological deficits. Extremities: Symmetric 5 x 5 power. Skin: No rashes, lesions or ulcers Psychiatry: Judgement and insight appear normal. Mood & affect appropriate.     Data Reviewed: I have personally reviewed following labs and imaging studies  CBC: Recent Labs  Lab 12/11/20 0147 12/12/20 0135 12/14/20 0913 12/15/20 0339  WBC 17.5* 11.1*  --   --   HGB 14.8 13.8 12.2* 12.1*  HCT 45.0 42.4 39.2 37.6*  MCV 91.6 91.2  --   --   PLT 307 307  --   --     Basic Metabolic Panel: Recent Labs  Lab 12/11/20 0147 12/12/20 0135  NA 133* 133*  K 4.6 4.4  CL 99 98  CO2 23 27  GLUCOSE 240* 208*  BUN 28* 36*  CREATININE 1.12 1.22  CALCIUM 9.0 9.0    GFR: Estimated Creatinine Clearance: 81.3 mL/min (by C-G formula based on SCr of 1.22 mg/dL). Liver Function Tests: Recent Labs  Lab 12/12/20 0135  AST 15  ALT 8  ALKPHOS 53  BILITOT 0.8  PROT 5.4*  ALBUMIN 2.7*    No results for input(s): LIPASE, AMYLASE in the last 168 hours. No results for input(s): AMMONIA in the last 168 hours. Coagulation Profile: No results for input(s): INR, PROTIME in the last 168 hours. Cardiac Enzymes: No results for input(s): CKTOTAL, CKMB, CKMBINDEX, TROPONINI in the last 168 hours. BNP (last 3 results) No results for input(s): PROBNP in the last 8760 hours. HbA1C: No results for input(s): HGBA1C in the last  72 hours. CBG: Recent Labs  Lab 12/16/20 0634 12/16/20 1201 12/16/20 1655 12/16/20 2059 12/17/20 0624  GLUCAP 205* 117* 164* 138* 74    Lipid Profile: No results for input(s): CHOL, HDL, LDLCALC, TRIG, CHOLHDL, LDLDIRECT in the last 72 hours. Thyroid Function Tests: No results for input(s): TSH, T4TOTAL, FREET4, T3FREE, THYROIDAB in the last 72 hours. Anemia Panel: No results for input(s): VITAMINB12, FOLATE, FERRITIN, TIBC, IRON, RETICCTPCT in the last 72 hours. Sepsis Labs: No results for input(s): PROCALCITON, LATICACIDVEN in the last 168 hours.  Recent Results (from the past 240 hour(s))  Surgical pcr screen     Status: Abnormal   Collection Time: 12/10/20  2:40 PM   Specimen: Nasal Mucosa; Nasal Swab  Result Value Ref Range Status   MRSA, PCR NEGATIVE NEGATIVE Final   Staphylococcus aureus POSITIVE (A) NEGATIVE Final    Comment: (NOTE) The Xpert SA Assay (FDA approved for NASAL specimens in patients 46 years of age and older), is one component of a comprehensive surveillance program. It is not intended to diagnose infection nor to guide or monitor  treatment. Performed at [facility], [place] [address] [place], [place] [address]           Radiology Studies: DG CHEST PORT 1 VIEW  Result Date: 12/16/2020 CLINICAL DATA:  Chest tube, lung surgery EXAM: PORTABLE CHEST 1 VIEW COMPARISON:  Radiograph 12/15/2020, chest CT 12/09/2020 FINDINGS: Unchanged cardiomediastinal silhouette. Postsurgical changes in the right upper lobe. There is a small right-sided pneumothorax, increased in size in comparison to yesterday's exam, with unchanged position of right apical chest tube. No new airspace disease. No large pleural effusion. No acute osseous abnormality. Unchanged subcutaneous emphysema along the right chest wall. IMPRESSION: Small right-sided pneumothorax, increased in size in comparison to yesterday's exam. Unchanged position of right apical chest tube. Electronically  Signed   By: Maurine Simmering M.D.   On: 12/16/2020 09:33   DG Chest Port 1 View  Result Date: 12/15/2020 CLINICAL DATA:  Status post chest tube placement. EXAM: PORTABLE CHEST 1 VIEW COMPARISON:  December 15, 2020 FINDINGS: Interval right-sided chest tube placement is noted with its distal tip seen overlying the medial aspect of the upper right lung. Ill-defined surgical sutures are also seen within this region. Expansion of the right lung is seen with a 3 mm residual lateral right pneumothorax noted. Stable right apical pleural thickening is present. The heart size and mediastinal contours are within normal limits. Moderate to marked severity subcutaneous emphysema is seen along the lateral aspect of the right chest wall. The visualized skeletal structures are unremarkable. IMPRESSION: Interval right-sided chest tube placement with a 3 mm residual lateral right pneumothorax. Electronically Signed   By: Virgina Norfolk M.D.   On: 12/15/2020 15:30   DG Chest Port 1 View  Result Date: 12/15/2020 CLINICAL DATA:  Follow-up pneumothorax. EXAM: PORTABLE CHEST 1 VIEW COMPARISON:  Chest CT 12/09/2020 and chest x-ray 12/14/2020 FINDINGS: Interval development of a large recurrent right-sided pneumothorax estimated at 80%. No shift of the heart or mediastinum. The left lung remains clear. Progressive subcutaneous emphysema on the right side. IMPRESSION: Large recurrent right-sided pneumothorax estimated at 80%. These results will be called to the ordering clinician or representative by the Radiologist Assistant, and communication documented in the PACS or [employer]. Electronically Signed   By: Marijo Sanes M.D.   On: 12/15/2020 13:04        Scheduled Meds:  bisacodyl  10 mg Oral Daily   enoxaparin (LOVENOX) injection  40 mg Subcutaneous Daily   insulin aspart  0-5 Units Subcutaneous QHS   insulin aspart  0-9 Units Subcutaneous TID WC   insulin aspart  5 Units Subcutaneous TID WC   insulin  glargine-yfgn  35 Units Subcutaneous QHS   lidocaine  1 patch Transdermal Q24H   risperiDONE  3 mg Oral Daily   senna-docusate  1 tablet Oral QHS   Continuous Infusions:     LOS: 12 days    Time spent: 35 min    Little Ishikawa, DO 12/17/2020, 8:29 AM

## 2020-12-18 ENCOUNTER — Ambulatory Visit: Payer: BC Managed Care – PPO | Admitting: Thoracic Surgery (Cardiothoracic Vascular Surgery)

## 2020-12-18 ENCOUNTER — Inpatient Hospital Stay (HOSPITAL_COMMUNITY): Payer: BC Managed Care – PPO

## 2020-12-18 DIAGNOSIS — J9311 Primary spontaneous pneumothorax: Secondary | ICD-10-CM | POA: Diagnosis not present

## 2020-12-18 LAB — GLUCOSE, CAPILLARY
Glucose-Capillary: 118 mg/dL — ABNORMAL HIGH (ref 70–99)
Glucose-Capillary: 317 mg/dL — ABNORMAL HIGH (ref 70–99)
Glucose-Capillary: 220 mg/dL — ABNORMAL HIGH (ref 70–99)
Glucose-Capillary: 344 mg/dL — ABNORMAL HIGH (ref 70–99)

## 2020-12-18 NOTE — Progress Notes (Signed)
      [address]       [place] [address]             [phone]      8 Days Post-Op Procedure(s) (LRB): XI ROBOTIC ASSISTED THORASCOPY-WEDGE RESECTION (Right) APICAL PLEURECTOMY (Right) MECHANICAL PLEURADESIS (Right) INTERCOSTAL NERVE BLOCK (Right) Subjective: Feels okay this morning. Asking about his paperwork which has been completed since Wednesday over at our office. It is available for pickup over at the office.   Objective: Vital signs in last 24 hours: Temp:  [97.8 F (36.6 C)-98.9 F (37.2 C)] 97.8 F (36.6 C) (10/14 0311) Pulse Rate:  [87-99] 87 (10/14 0311) Cardiac Rhythm: Sinus tachycardia (10/13 1902) Resp:  [11-17] 11 (10/14 0311) BP: (116-129)/(82-93) 123/86 (10/14 0311) SpO2:  [96 %-98 %] 97 % (10/14 0311)     Intake/Output from previous day: 10/13 0701 - 10/14 0700 In: 480 [P.O.:480] Out: 1580 [Urine:1580] Intake/Output this shift: No intake/output data recorded.  General appearance: alert, cooperative, and no distress Heart: regular rate and rhythm, S1, S2 normal, no murmur, click, rub or gallop Lungs: clear to auscultation bilaterally Abdomen: soft, non-tender; bowel sounds normal; no masses,  no organomegaly Extremities: extremities normal, atraumatic, no cyanosis or edema Wound: clean and dry  Lab Results: No results for input(s): WBC, HGB, HCT, PLT in the last 72 hours. BMET: No results for input(s): NA, K, CL, CO2, GLUCOSE, BUN, CREATININE, CALCIUM in the last 72 hours.  PT/INR: No results for input(s): LABPROT, INR in the last 72 hours. ABG    Component Value Date/Time   PHART 7.336 (L) 04/30/2014 1215   HCO3 28.9 (H) 09/16/2018 1338   TCO2 11.3 04/30/2014 1215   ACIDBASEDEF 14.5 (H) 11/09/2016 1745   O2SAT 90.7 09/16/2018 1338   CBG (last 3)  Recent Labs    12/17/20 1649 12/17/20 2119 12/18/20 0612  GLUCAP 218* 148* 118*    Assessment/Plan: S/P Procedure(s) (LRB): XI ROBOTIC ASSISTED THORASCOPY-WEDGE  RESECTION (Right) APICAL PLEURECTOMY (Right) MECHANICAL PLEURADESIS (Right) INTERCOSTAL NERVE BLOCK (Right)  CV- NSR in the 80s this morning, BP well controlled Pulm-pigtail catheter placed 10/11. CXR this morning is pending. I will follow-up.  Encouraging pulmonary hygiene, IS.   3.   Endo- H/O type 1 DM. Mgt per primary team. 4. H and H has been stable   Plan: Continue pigtail catheter to suction for another 24 hours. CXR in the morning to re-evaluate. Ambulate on suction only. Disability paperwork is completed and at our office. His girlfriend is planning to pick this up today.    LOS: 13 days    Elgie Collard 12/18/2020

## 2020-12-18 NOTE — Progress Notes (Signed)
PROGRESS NOTE  AMELIA MACKEN OIZ:[id number] DOB: 21-Feb-1996 DOA: 12/05/2020 PCP: Neale Burly, MD  HPI/Recap of past 24 hours: TAYSEAN WAGER is a 25 y.o. male with a PMH significant for type 1 diabetes, polysubstance use. Presented from home to the urgent care and then ED on 12/05/2020 with chest pain and difficulty breathing, nausea, lightheadedness x several days. In the ED, it was found that they had right-sided spontaneous tension pneumothorax - chest tube was placed - thoracoscopy/wedge resection and pleurectomy recurrent large right pneumothorax noted 12/15/20 requiring replacement of chest tube.  Unfortunately over the past 48 hours on 12/16/2020 and 12/17/2020 repeat imaging shows worsening and enlarging pneumothorax, CT surgery continues to monitor and manage the chest tube currently to suction with ultimate plan to discharge patient home once stabilized with chest tube and mini express but given ongoing worsening imaging there is some discussion about need for repeat procedure.  Patient otherwise clinically appears well pain is well controlled and remains without hypoxia ambulating without any difficulty today.  12/18/2020: Patient was seen and examined at his bedside.  He reports pain on the right side of his chest at the level of chest tube about a 5 out of 10.  He has no other complaints.  He was seen by CTS this morning, recommendation to continue pigtail catheter to suction for another 24 hours, chest x-ray in the morning to reevaluate, ambulate on suction only.  Assessment/Plan: Principal Problem:   Primary spontaneous pneumothorax Active Problems:   DM type 1 (diabetes mellitus, type 1) (HCC)   Hyperglycemia   Chest tube in place  Spontaneous right pneumothorax status post pigtail catheter placement on 12/15/2020. Pulmonary pigtail catheter placed on 12/15/2020. Continue pulmonary hygiene, incentive spirometer as recommended by cardiothoracic surgery. Seen by CTS this  morning, planning to continue pigtail catheter to suction for another 24 hours. Repeat chest x-ray on 12/19/2020.  Type 1 diabetes with hyperglycemia Hemoglobin A1c 10.5 on 12/06/2020. Continue subcu insulin  Resolved AKI in the setting of dehydration, prerenal. Appears to be at his baseline creatinine 1.2 with GFR greater than 60. Presented with creatinine of 1.76 on 12/05/2020. Continue to avoid nephrotoxic agents Monitor urine output Repeat BMP in the morning.  Polysubstance use disorder UDS positive for opiates, benzodiazepine, amphetamine, THC, Continue home risperidone.    Code Status: Full code  Family Communication: None at bedside  Disposition Plan: Likely will discharge to home once CTA signs of.   Consultants: CTS  Procedures: Chest tube placement  Antimicrobials: None  DVT prophylaxis: Subcu Lovenox daily  Status is: Inpatient        Objective: Vitals:   12/17/20 2323 12/18/20 0311 12/18/20 0727 12/18/20 1108  BP: 120/82 123/86 118/86 119/82  Pulse:  87 88 83  Resp: 17 11 14 16   Temp: 98.2 F (36.8 C) 97.8 F (36.6 C) 98.2 F (36.8 C) 98.2 F (36.8 C)  TempSrc: Core Oral Oral Oral  SpO2: 98% 97% 98% 100%  Weight:      Height:        Intake/Output Summary (Last 24 hours) at 12/18/2020 1138 Last data filed at 12/18/2020 1110 Gross per 24 hour  Intake 240 ml  Output 2630 ml  Net -2390 ml   Filed Weights   12/05/20 1459 12/05/20 2250 12/15/20 0401  Weight: 63.5 kg 61.2 kg 63.2 kg    Exam:  General: 25 y.o. year-old male well developed well nourished in no acute distress.  Alert and oriented x3. Cardiovascular: Regular rate  and rhythm with no rubs or gallops.  No thyromegaly or JVD noted.   Respiratory: Clear to auscultation with no wheezes or rales. Good inspiratory effort.  Right lateral chest with chest tube in place. Abdomen: Soft nontender nondistended with normal bowel sounds x4 quadrants. Musculoskeletal: No lower extremity  edema. 2/4 pulses in all 4 extremities. Skin: No ulcerative lesions noted or rashes, Psychiatry: Mood is appropriate for condition and setting   Data Reviewed: CBC: Recent Labs  Lab 12/12/20 0135 12/14/20 0913 12/15/20 0339  WBC 11.1*  --   --   HGB 13.8 12.2* 12.1*  HCT 42.4 39.2 37.6*  MCV 91.2  --   --   PLT 307  --   --    Basic Metabolic Panel: Recent Labs  Lab 12/12/20 0135  NA 133*  K 4.4  CL 98  CO2 27  GLUCOSE 208*  BUN 36*  CREATININE 1.22  CALCIUM 9.0   GFR: Estimated Creatinine Clearance: 81.3 mL/min (by C-G formula based on SCr of 1.22 mg/dL). Liver Function Tests: Recent Labs  Lab 12/12/20 0135  AST 15  ALT 8  ALKPHOS 53  BILITOT 0.8  PROT 5.4*  ALBUMIN 2.7*   No results for input(s): LIPASE, AMYLASE in the last 168 hours. No results for input(s): AMMONIA in the last 168 hours. Coagulation Profile: No results for input(s): INR, PROTIME in the last 168 hours. Cardiac Enzymes: No results for input(s): CKTOTAL, CKMB, CKMBINDEX, TROPONINI in the last 168 hours. BNP (last 3 results) No results for input(s): PROBNP in the last 8760 hours. HbA1C: No results for input(s): HGBA1C in the last 72 hours. CBG: Recent Labs  Lab 12/17/20 1208 12/17/20 1649 12/17/20 2119 12/18/20 0612 12/18/20 1107  GLUCAP 226* 218* 148* 118* 317*   Lipid Profile: No results for input(s): CHOL, HDL, LDLCALC, TRIG, CHOLHDL, LDLDIRECT in the last 72 hours. Thyroid Function Tests: No results for input(s): TSH, T4TOTAL, FREET4, T3FREE, THYROIDAB in the last 72 hours. Anemia Panel: No results for input(s): VITAMINB12, FOLATE, FERRITIN, TIBC, IRON, RETICCTPCT in the last 72 hours. Urine analysis:    Component Value Date/Time   COLORURINE STRAW (A) 11/21/2017 1504   APPEARANCEUR CLEAR 11/21/2017 1504   LABSPEC 1.027 11/21/2017 1504   PHURINE 6.0 11/21/2017 1504   GLUCOSEU >=500 (A) 11/21/2017 1504   HGBUR NEGATIVE 11/21/2017 1504   BILIRUBINUR NEGATIVE 11/21/2017  1504   BILIRUBINUR neg 04/16/2014 1752   KETONESUR 20 (A) 11/21/2017 1504   PROTEINUR NEGATIVE 11/21/2017 1504   UROBILINOGEN 0.2 10/17/2014 0743   NITRITE NEGATIVE 11/21/2017 1504   LEUKOCYTESUR NEGATIVE 11/21/2017 1504   Sepsis Labs: @LABRCNTIP (procalcitonin:4,lacticidven:4)  ) Recent Results (from the past 240 hour(s))  Surgical pcr screen     Status: Abnormal   Collection Time: 12/10/20  2:40 PM   Specimen: Nasal Mucosa; Nasal Swab  Result Value Ref Range Status   MRSA, PCR NEGATIVE NEGATIVE Final   Staphylococcus aureus POSITIVE (A) NEGATIVE Final    Comment: (NOTE) The Xpert SA Assay (FDA approved for NASAL specimens in patients 89 years of age and older), is one component of a comprehensive surveillance program. It is not intended to diagnose infection nor to guide or monitor treatment. Performed at [facility], [place] [address], [place], [place] [address]       Studies: DG Chest 1 View  Result Date: 12/18/2020 CLINICAL DATA:  Follow-up chest tube EXAM: CHEST  1 VIEW COMPARISON:  Yesterday FINDINGS: Decreased right pneumothorax which is small in seen at  the apex. Interval postoperative right upper lobe which likely accounts for the new extrapleural thickening. Atelectasis at the right base. Normal heart size. IMPRESSION: Small right apical pneumothorax.  Mild atelectasis. Electronically Signed   By: Jorje Guild M.D.   On: 12/18/2020 07:53    Scheduled Meds:  bisacodyl  10 mg Oral Daily   enoxaparin (LOVENOX) injection  40 mg Subcutaneous Daily   insulin aspart  0-5 Units Subcutaneous QHS   insulin aspart  0-9 Units Subcutaneous TID WC   insulin aspart  5 Units Subcutaneous TID WC   insulin glargine-yfgn  35 Units Subcutaneous QHS   lidocaine  1 patch Transdermal Q24H   risperiDONE  3 mg Oral Daily   senna-docusate  1 tablet Oral QHS    Continuous Infusions:   LOS: 13 days     Kayleen Memos, MD [facility] Pager [phone]  If 7PM-7AM,  please contact night-coverage [email or web address] Password [email or web address] 12/18/2020, 11:38 AM

## 2020-12-18 NOTE — Progress Notes (Signed)
Mobility Specialist Progress Note    12/18/20 1042  Mobility  Activity Ambulated in room  Level of Assistance Independent  Assistive Device None  Minutes Stood 5 minutes  Distance Ambulated (ft) 10 ft  Mobility Ambulated independently in room  Mobility Response Tolerated well  Mobility performed by Mobility specialist  $Mobility charge 1 Mobility   Pt received in bed and agreeable. Walked to other side of bed and stood while shifting weight between feet. HR reached 142 while standing. Returned to EOB and encouraged IS use.   Hildred Alamin Mobility Specialist  Mobility Specialist Phone: [phone]

## 2020-12-19 ENCOUNTER — Inpatient Hospital Stay (HOSPITAL_COMMUNITY): Payer: BC Managed Care – PPO

## 2020-12-19 DIAGNOSIS — J9311 Primary spontaneous pneumothorax: Secondary | ICD-10-CM | POA: Diagnosis not present

## 2020-12-19 LAB — GLUCOSE, CAPILLARY
Glucose-Capillary: 155 mg/dL — ABNORMAL HIGH (ref 70–99)
Glucose-Capillary: 160 mg/dL — ABNORMAL HIGH (ref 70–99)
Glucose-Capillary: 74 mg/dL (ref 70–99)
Glucose-Capillary: 297 mg/dL — ABNORMAL HIGH (ref 70–99)

## 2020-12-19 LAB — CBC
HCT: 37.9 % — ABNORMAL LOW (ref 39.0–52.0)
Hemoglobin: 12.3 g/dL — ABNORMAL LOW (ref 13.0–17.0)
MCH: 29.9 pg (ref 26.0–34.0)
MCHC: 32.5 g/dL (ref 30.0–36.0)
MCV: 92 fL (ref 80.0–100.0)
Platelets: 454 10*3/uL — ABNORMAL HIGH (ref 150–400)
RBC: 4.12 MIL/uL — ABNORMAL LOW (ref 4.22–5.81)
RDW: 12.4 % (ref 11.5–15.5)
WBC: 9.9 10*3/uL (ref 4.0–10.5)
nRBC: 0 % (ref 0.0–0.2)

## 2020-12-19 LAB — BASIC METABOLIC PANEL
Anion gap: 8 (ref 5–15)
BUN: 30 mg/dL — ABNORMAL HIGH (ref 6–20)
CO2: 27 mmol/L (ref 22–32)
Calcium: 9 mg/dL (ref 8.9–10.3)
Chloride: 102 mmol/L (ref 98–111)
Creatinine, Ser: 1.13 mg/dL (ref 0.61–1.24)
GFR, Estimated: >60 mL/min (ref >60)
Glucose, Bld: 169 mg/dL — ABNORMAL HIGH (ref 70–99)
Potassium: 4.3 mmol/L (ref 3.5–5.1)
Sodium: 137 mmol/L (ref 135–145)

## 2020-12-19 LAB — PHOSPHORUS: Phosphorus: 3.5 mg/dL (ref 2.5–4.6)

## 2020-12-19 LAB — MAGNESIUM: Magnesium: 1.9 mg/dL (ref 1.7–2.4)

## 2020-12-19 NOTE — Progress Notes (Addendum)
PROGRESS NOTE  Pedro [name] EQA:[id number] DOB: 18-Jun-1995 DOA: 12/05/2020 PCP: Neale Burly, MD  HPI/Recap of past 24 hours: Pedro [name] is a 25 y.o. male with a PMH significant for type 1 diabetes, polysubstance use.  Initially presented to the urgent care but he was referred to [facility], ED as he was tachycardic and tachypneic on 12/05/2020.  Also having associated chest pain and difficulty breathing x several days.  Work-up in the ED revealed right-sided spontaneous tension pneumothorax - chest tube was placed -status post thoracoscopy/wedge resection and pleurectomy.  Recurrent large right pneumothorax noted 12/15/20 requiring replacement of chest tube.  On 12/16/2020 and 12/17/2020 repeat imaging shows worsening and enlarging pneumothorax, CT surgery managing the chest tube.  Currently to suction with ultimate plan to discharge patient home once stabilized with chest tube and mini express but given ongoing worsening imaging there is some discussion about need for repeat procedure.  Patient otherwise clinically appears well pain is well controlled and remains without hypoxia ambulating without any difficulty today.  Repeat chest x-ray 12/19/2020 showed stable appearance of right-sided pneumothorax small to moderate in size.  12/19/2020: Reports having some pain on the right lateral side of his chest at the site of the chest tube.  Pain management in place.  Assessment/Plan: Principal Problem:   Primary spontaneous pneumothorax Active Problems:   DM type 1 (diabetes mellitus, type 1) (HCC)   Hyperglycemia   Chest tube in place  Spontaneous right pneumothorax status post pigtail catheter placement on 12/15/2020. Pulmonary pigtail catheter placed on 12/15/2020. Continue pulmonary hygiene, incentive spirometer as recommended by cardiothoracic surgery. Seen by CTS this morning, planning to continue pigtail catheter to suction for now. Repeat chest x-ray 12/19/2020, personally  reviewed showed stable appearance of right-sided pneumothorax small to moderate in size. Pain management in place with bowel regimen.  Type 1 diabetes with hyperglycemia Hemoglobin A1c 10.5 on 12/06/2020. Continue subcu insulin  Resolved AKI in the setting of dehydration, prerenal. Appears to be at his baseline creatinine 1.1 from 1.2 with GFR greater than 60. Presented with creatinine of 1.76 on 12/05/2020. Continue to avoid nephrotoxic agents. Good urine output, creatinine 2700 cc recorded in the last 24 hours, monitor urine output Repeat BMP in the morning.  Polysubstance use disorder UDS positive for opiates, benzodiazepine, amphetamine, THC, Continue home risperidone. Denies current drug use to admitting physician.    Code Status: Full code  Family Communication: None at bedside  Disposition Plan: Likely will discharge to home once CTA signs of.   Consultants: CTS  Procedures: Chest tube placement  Antimicrobials: None  DVT prophylaxis: Subcu Lovenox daily  Status is: Inpatient        Objective: Vitals:   12/19/20 0013 12/19/20 0410 12/19/20 0752 12/19/20 1212  BP: 118/81 116/84 123/86 118/81  Pulse: 87 75 81 88  Resp: 16 19 20 20   Temp: 97.9 F (36.6 C) 98 F (36.7 C) 97.9 F (36.6 C) 98.2 F (36.8 C)  TempSrc: Oral Oral Oral Oral  SpO2: 98% 98% 99% 98%  Weight:      Height:        Intake/Output Summary (Last 24 hours) at 12/19/2020 1224 Last data filed at 12/19/2020 0900 Gross per 24 hour  Intake 120 ml  Output 2455 ml  Net -2335 ml   Filed Weights   12/05/20 1459 12/05/20 2250 12/15/20 0401  Weight: 63.5 kg 61.2 kg 63.2 kg    Exam:  General: 25 y.o. year-old male well-developed well-nourished in  no acute distress.  He is alert and oriented x3. Cardiovascular: Regular rate and rhythm no rubs or gallops. Respiratory: Clear to auscultation with no wheezes or rales.  Poor inspiratory effort.  Right lateral chest with chest tube in  place. Abdomen: Soft nontender normal bowel sounds present.  Musculoskeletal: No lower extremity edema bilaterally.   Skin: No ulcerative lesions noted.   Psychiatry: Mood is appropriate for condition and setting.  Data Reviewed: CBC: Recent Labs  Lab 12/14/20 0913 12/15/20 0339 12/19/20 0617  WBC  --   --  9.9  HGB 12.2* 12.1* 12.3*  HCT 39.2 37.6* 37.9*  MCV  --   --  92.0  PLT  --   --  852*   Basic Metabolic Panel: Recent Labs  Lab 12/19/20 0617  NA 137  K 4.3  CL 102  CO2 27  GLUCOSE 169*  BUN 30*  CREATININE 1.13  CALCIUM 9.0  MG 1.9  PHOS 3.5   GFR: Estimated Creatinine Clearance: 87.8 mL/min (by C-G formula based on SCr of 1.13 mg/dL). Liver Function Tests: No results for input(s): AST, ALT, ALKPHOS, BILITOT, PROT, ALBUMIN in the last 168 hours.  No results for input(s): LIPASE, AMYLASE in the last 168 hours. No results for input(s): AMMONIA in the last 168 hours. Coagulation Profile: No results for input(s): INR, PROTIME in the last 168 hours. Cardiac Enzymes: No results for input(s): CKTOTAL, CKMB, CKMBINDEX, TROPONINI in the last 168 hours. BNP (last 3 results) No results for input(s): PROBNP in the last 8760 hours. HbA1C: No results for input(s): HGBA1C in the last 72 hours. CBG: Recent Labs  Lab 12/18/20 1107 12/18/20 1601 12/18/20 2136 12/19/20 0617 12/19/20 1210  GLUCAP 317* 220* 344* 155* 160*   Lipid Profile: No results for input(s): CHOL, HDL, LDLCALC, TRIG, CHOLHDL, LDLDIRECT in the last 72 hours. Thyroid Function Tests: No results for input(s): TSH, T4TOTAL, FREET4, T3FREE, THYROIDAB in the last 72 hours. Anemia Panel: No results for input(s): VITAMINB12, FOLATE, FERRITIN, TIBC, IRON, RETICCTPCT in the last 72 hours. Urine analysis:    Component Value Date/Time   COLORURINE STRAW (A) 11/21/2017 1504   APPEARANCEUR CLEAR 11/21/2017 1504   LABSPEC 1.027 11/21/2017 1504   PHURINE 6.0 11/21/2017 1504   GLUCOSEU >=500 (A)  11/21/2017 1504   HGBUR NEGATIVE 11/21/2017 1504   BILIRUBINUR NEGATIVE 11/21/2017 1504   BILIRUBINUR neg 04/16/2014 1752   KETONESUR 20 (A) 11/21/2017 1504   PROTEINUR NEGATIVE 11/21/2017 1504   UROBILINOGEN 0.2 10/17/2014 0743   NITRITE NEGATIVE 11/21/2017 1504   LEUKOCYTESUR NEGATIVE 11/21/2017 1504   Sepsis Labs: @LABRCNTIP (procalcitonin:4,lacticidven:4)  ) Recent Results (from the past 240 hour(s))  Surgical pcr screen     Status: Abnormal   Collection Time: 12/10/20  2:40 PM   Specimen: Nasal Mucosa; Nasal Swab  Result Value Ref Range Status   MRSA, PCR NEGATIVE NEGATIVE Final   Staphylococcus aureus POSITIVE (A) NEGATIVE Final    Comment: (NOTE) The Xpert SA Assay (FDA approved for NASAL specimens in patients 71 years of age and older), is one component of a comprehensive surveillance program. It is not intended to diagnose infection nor to guide or monitor treatment. Performed at [facility], [place] [address], [place], [place] [address]       Studies: DG Chest 1 View  Result Date: 12/19/2020 CLINICAL DATA:  Chest tube placement, pneumothorax. EXAM: CHEST  1 VIEW COMPARISON:  Chest x-ray dated 12/18/2020. FINDINGS: RIGHT-sided chest tube is stable in position. Stable appearance of  the RIGHT-sided pneumothorax, small to moderate in size. Probable mild atelectasis at the RIGHT lung base. LEFT lung is clear. Heart size is normal. IMPRESSION: Stable appearance of the RIGHT-sided pneumothorax, small to moderate in size. RIGHT-sided chest tube is stable in position. Probable mild atelectasis at the RIGHT lung base. Electronically Signed   By: Franki Cabot M.D.   On: 12/19/2020 10:14    Scheduled Meds:  bisacodyl  10 mg Oral Daily   enoxaparin (LOVENOX) injection  40 mg Subcutaneous Daily   insulin aspart  0-5 Units Subcutaneous QHS   insulin aspart  0-9 Units Subcutaneous TID WC   insulin aspart  5 Units Subcutaneous TID WC   insulin glargine-yfgn  35 Units  Subcutaneous QHS   lidocaine  1 patch Transdermal Q24H   risperiDONE  3 mg Oral Daily   senna-docusate  1 tablet Oral QHS    Continuous Infusions:   LOS: 14 days     Kayleen Memos, MD [facility] Pager [phone]  If 7PM-7AM, please contact night-coverage [email or web address] Password [facility] 12/19/2020, 12:24 PM

## 2020-12-19 NOTE — Progress Notes (Addendum)
      [address]       [place] [address]             [phone]      9 Days Post-Op Procedure(s) (LRB): XI ROBOTIC ASSISTED THORASCOPY-WEDGE RESECTION (Right) APICAL PLEURECTOMY (Right) MECHANICAL PLEURADESIS (Right) INTERCOSTAL NERVE BLOCK (Right) Subjective: No specific c/o   Objective: Vital signs in last 24 hours: Temp:  [97.8 F (36.6 C)-98.6 F (37 C)] 97.9 F (36.6 C) (10/15 0752) Pulse Rate:  [75-90] 81 (10/15 0752) Cardiac Rhythm: Normal sinus rhythm (10/15 0700) Resp:  [16-20] 20 (10/15 0752) BP: (116-124)/(81-86) 123/86 (10/15 0752) SpO2:  [97 %-100 %] 99 % (10/15 0752)  Hemodynamic parameters for last 24 hours:    Intake/Output from previous day: 10/14 0701 - 10/15 0700 In: 120 [P.O.:120] Out: 2755 [Urine:2725; Chest Tube:30] Intake/Output this shift: No intake/output data recorded.  General appearance: alert, cooperative, and no distress Heart: regular rate and rhythm Lungs: clear to auscultation bilaterally Abdomen: benign  Lab Results: Recent Labs    12/19/20 0617  WBC 9.9  HGB 12.3*  HCT 37.9*  PLT 454*   BMET:  Recent Labs    12/19/20 0617  NA 137  K 4.3  CL 102  CO2 27  GLUCOSE 169*  BUN 30*  CREATININE 1.13  CALCIUM 9.0    PT/INR: No results for input(s): LABPROT, INR in the last 72 hours. ABG    Component Value Date/Time   PHART 7.336 (L) 04/30/2014 1215   HCO3 28.9 (H) 09/16/2018 1338   TCO2 11.3 04/30/2014 1215   ACIDBASEDEF 14.5 (H) 11/09/2016 1745   O2SAT 90.7 09/16/2018 1338   CBG (last 3)  Recent Labs    12/18/20 1601 12/18/20 2136 12/19/20 0617  GLUCAP 220* 344* 155*    Meds Scheduled Meds:  bisacodyl  10 mg Oral Daily   enoxaparin (LOVENOX) injection  40 mg Subcutaneous Daily   insulin aspart  0-5 Units Subcutaneous QHS   insulin aspart  0-9 Units Subcutaneous TID WC   insulin aspart  5 Units Subcutaneous TID WC   insulin glargine-yfgn  35 Units Subcutaneous QHS   lidocaine  1  patch Transdermal Q24H   risperiDONE  3 mg Oral Daily   senna-docusate  1 tablet Oral QHS   Continuous Infusions: PRN Meds:.morphine injection, oxyCODONE, traMADol  Xrays DG Chest 1 View  Result Date: 12/18/2020 CLINICAL DATA:  Follow-up chest tube EXAM: CHEST  1 VIEW COMPARISON:  Yesterday FINDINGS: Decreased right pneumothorax which is small in seen at the apex. Interval postoperative right upper lobe which likely accounts for the new extrapleural thickening. Atelectasis at the right base. Normal heart size. IMPRESSION: Small right apical pneumothorax.  Mild atelectasis. Electronically Signed   By: Jorje Guild M.D.   On: 12/18/2020 07:53    Assessment/Plan: S/P Procedure(s) (LRB): XI ROBOTIC ASSISTED THORASCOPY-WEDGE RESECTION (Right) APICAL PLEURECTOMY (Right) MECHANICAL PLEURADESIS (Right) INTERCOSTAL NERVE BLOCK (Right)  1 afeb, VSS 2 sats good on RA 3 CXR- no pntx on todays exam 4 BS control is fair- medicine managing medical issues 5 CT 1/7 air leak with cough- leave on suction for now     LOS: 14 days    John Giovanni PA-C Pager [phone] 12/19/2020    CXR improved Will keep to suction this weekend  [employer]

## 2020-12-20 ENCOUNTER — Inpatient Hospital Stay (HOSPITAL_COMMUNITY): Payer: BC Managed Care – PPO

## 2020-12-20 DIAGNOSIS — J9311 Primary spontaneous pneumothorax: Secondary | ICD-10-CM | POA: Diagnosis not present

## 2020-12-20 LAB — CBC
HCT: 37.9 % — ABNORMAL LOW (ref 39.0–52.0)
Hemoglobin: 12.2 g/dL — ABNORMAL LOW (ref 13.0–17.0)
MCH: 30.3 pg (ref 26.0–34.0)
MCHC: 32.2 g/dL (ref 30.0–36.0)
MCV: 94 fL (ref 80.0–100.0)
Platelets: 465 10*3/uL — ABNORMAL HIGH (ref 150–400)
RBC: 4.03 MIL/uL — ABNORMAL LOW (ref 4.22–5.81)
RDW: 12.3 % (ref 11.5–15.5)
WBC: 10.5 10*3/uL (ref 4.0–10.5)
nRBC: 0 % (ref 0.0–0.2)

## 2020-12-20 LAB — BASIC METABOLIC PANEL
Anion gap: 7 (ref 5–15)
BUN: 35 mg/dL — ABNORMAL HIGH (ref 6–20)
CO2: 28 mmol/L (ref 22–32)
Calcium: 9.2 mg/dL (ref 8.9–10.3)
Chloride: 100 mmol/L (ref 98–111)
Creatinine, Ser: 1.1 mg/dL (ref 0.61–1.24)
GFR, Estimated: >60 mL/min (ref >60)
Glucose, Bld: 261 mg/dL — ABNORMAL HIGH (ref 70–99)
Potassium: 4.2 mmol/L (ref 3.5–5.1)
Sodium: 135 mmol/L (ref 135–145)

## 2020-12-20 LAB — GLUCOSE, CAPILLARY
Glucose-Capillary: 263 mg/dL — ABNORMAL HIGH (ref 70–99)
Glucose-Capillary: 246 mg/dL — ABNORMAL HIGH (ref 70–99)
Glucose-Capillary: 273 mg/dL — ABNORMAL HIGH (ref 70–99)
Glucose-Capillary: 263 mg/dL — ABNORMAL HIGH (ref 70–99)

## 2020-12-20 LAB — PHOSPHORUS: Phosphorus: 3.4 mg/dL (ref 2.5–4.6)

## 2020-12-20 LAB — MAGNESIUM: Magnesium: 1.9 mg/dL (ref 1.7–2.4)

## 2020-12-20 MED ORDER — INSULIN GLARGINE-YFGN 100 UNIT/ML ~~LOC~~ SOLN
40.0000 [IU] | Freq: Every day | SUBCUTANEOUS | Status: DC
  Administered 2020-12-20 – 2020-12-21 (×2): 40 [IU] via SUBCUTANEOUS
  Filled 2020-12-20 (×3): qty 0.4

## 2020-12-20 NOTE — Progress Notes (Addendum)
[address]       [employer] [address]             [phone]      10 Days Post-Op Procedure(s) (LRB): XI ROBOTIC ASSISTED THORASCOPY-WEDGE RESECTION (Right) APICAL PLEURECTOMY (Right) MECHANICAL PLEURADESIS (Right) INTERCOSTAL NERVE BLOCK (Right) Subjective: Frustrated but feels ok  Objective: Vital signs in last 24 hours: Temp:  [97.9 F (36.6 C)-98.8 F (37.1 C)] 98.8 F (37.1 C) (10/16 0832) Pulse Rate:  [85-92] 85 (10/16 0832) Cardiac Rhythm: Normal sinus rhythm (10/16 0700) Resp:  [16-20] 18 (10/16 0832) BP: (111-123)/(80-93) 122/87 (10/16 0832) SpO2:  [98 %-99 %] 98 % (10/16 0832)  Hemodynamic parameters for last 24 hours:    Intake/Output from previous day: 10/15 0701 - 10/16 0700 In: 240 [P.O.:240] Out: 1503 [Urine:1500; Chest Tube:3] Intake/Output this shift: Total I/O In: 240 [P.O.:240] Out: -   General appearance: alert, cooperative, and no distress Heart: regularly irregular rhythm Lungs: clear to auscultation bilaterally  Lab Results: Recent Labs    12/19/20 0617 12/20/20 0147  WBC 9.9 10.5  HGB 12.3* 12.2*  HCT 37.9* 37.9*  PLT 454* 465*   BMET:  Recent Labs    12/19/20 0617 12/20/20 0147  NA 137 135  K 4.3 4.2  CL 102 100  CO2 27 28  GLUCOSE 169* 261*  BUN 30* 35*  CREATININE 1.13 1.10  CALCIUM 9.0 9.2    PT/INR: No results for input(s): LABPROT, INR in the last 72 hours. ABG    Component Value Date/Time   PHART 7.336 (L) 04/30/2014 1215   HCO3 28.9 (H) 09/16/2018 1338   TCO2 11.3 04/30/2014 1215   ACIDBASEDEF 14.5 (H) 11/09/2016 1745   O2SAT 90.7 09/16/2018 1338   CBG (last 3)  Recent Labs    12/19/20 1642 12/19/20 2103 12/20/20 0609  GLUCAP 74 297* 263*    Meds Scheduled Meds:  bisacodyl  10 mg Oral Daily   enoxaparin (LOVENOX) injection  40 mg Subcutaneous Daily   insulin aspart  0-5 Units Subcutaneous QHS   insulin aspart  0-9 Units Subcutaneous TID WC   insulin aspart  5 Units  Subcutaneous TID WC   insulin glargine-yfgn  35 Units Subcutaneous QHS   lidocaine  1 patch Transdermal Q24H   risperiDONE  3 mg Oral Daily   senna-docusate  1 tablet Oral QHS   Continuous Infusions: PRN Meds:.morphine injection, oxyCODONE, traMADol  Xrays DG Chest 1 View  Result Date: 12/19/2020 CLINICAL DATA:  Chest tube placement, pneumothorax. EXAM: CHEST  1 VIEW COMPARISON:  Chest x-ray dated 12/18/2020. FINDINGS: RIGHT-sided chest tube is stable in position. Stable appearance of the RIGHT-sided pneumothorax, small to moderate in size. Probable mild atelectasis at the RIGHT lung base. LEFT lung is clear. Heart size is normal. IMPRESSION: Stable appearance of the RIGHT-sided pneumothorax, small to moderate in size. RIGHT-sided chest tube is stable in position. Probable mild atelectasis at the RIGHT lung base. Electronically Signed   By: Franki Cabot M.D.   On: 12/19/2020 10:14   DG Chest 2 View  Result Date: 12/20/2020 CLINICAL DATA:  Follow-up exam, chest pain. EXAM: CHEST - 2 VIEW COMPARISON:  Chest x-rays dated 12/19/2020 and 12/18/2020. FINDINGS: Heart size and mediastinal contours are stable. Suture line along the upper RIGHT mediastinal margin. RIGHT-sided chest tube is stable in position. Stable appearance of the pneumothorax and pleural fluid/thickening at the RIGHT lung apex, adjacent to the chest tube and suture line. LEFT lung remains clear.  IMPRESSION: Stable chest x-ray. Stable small pneumothorax and pleural fluid/thickening at the RIGHT lung apex. Adjacent RIGHT-sided chest tube is stable in position with tip at the medial aspect of the RIGHT lung apex. Electronically Signed   By: Franki Cabot M.D.   On: 12/20/2020 06:58    Assessment/Plan: S/P Procedure(s) (LRB): XI ROBOTIC ASSISTED THORASCOPY-WEDGE RESECTION (Right) APICAL PLEURECTOMY (Right) MECHANICAL PLEURADESIS (Right) INTERCOSTAL NERVE BLOCK (Right)  1 afeb, VSS 2 sats good on RA 3 CXR-Stable chest x-ray.  Stable small pneumothorax and pleural fluid/thickening at the RIGHT lung apex. Adjacent RIGHT-sided chest tube is stable in position with tip at the medial aspect of the RIGHT lung apex 4 CT no air leak 5 cont current plan , hopefully can come off suction tomorrow     LOS: 15 days    John Giovanni PA-C Pager [phone] 12/20/2020    Agree with above. Will transition to waterseal tomorrow.  Corah Willeford Bary Leriche

## 2020-12-20 NOTE — Progress Notes (Signed)
PROGRESS NOTE    PEARLEY BARANEK  JJO:[id number] DOB: [birth year] DOA: 12/05/2020 PCP: Neale Burly, MD   Chief Complaint  Patient presents with   Chest Pain   Brief Narrative/Hospital Course:  [name], 25 y.o. male with PMH of T1DM polysubstance abuse seen in the ED 12/05/20 at [facility] with tachycardia, tachypnea, chest pain, shortness of breath lightheadedness of several days, and in the ED found to have acute hypoxic respiratory failure SPO2 80% on room air, chest x-ray showing right pneumothorax large with tension physiology chest tube placed in the ED, and was admitted and chest tube planned to be managed by Dr. Arnoldo Morale at [facility].  But patient had worsening respiratory status repeat chest x-ray done by [facility] chest showed extensive diffuse right lung airspace disease mild present edema, infection or hemorrhoids, discussed with PCCM started IV Lasix, chest tube to continuous suction, consulted CT surgery Dr. Mohammed Kindle and transferred to [facility]. Had [facility] [place] patient underwent robotic assisted thorascopic, wedge resection and apical pleurectomy, mechanical pleurodesis and intercostal nerve block on 12/10/20. On 10/12 and 10/13 patient had worsening and enlarging pneumothorax. Patient is managed with suction.  Overall clinically improving, at this time off oxygen and on room air.   Subjective: Resting, no chest pain, on RA Has no complaints.  Appears slightly drowsy.  Chest x-ray this morning "Stable chest x-ray. Stable small pneumothorax and pleural fluid/thickening at the RIGHT lung apex. Adjacent RIGHT-sided chest tube is stable in position with tip at the medial aspect of the RIGHT lung apex"  Assessment & Plan:  Primary spontaneous pneumothorax: Initial chest tube insertion in ED 10/1> transferred to [facility] seen by CT VS and with worsening and continuous air leak s/p robotic assisted thorascopic, wedge resection and apical pleurectomy, mechanical pleurodesis and intercostal nerve  block on 12/10/20. Chest x-ray this morning shows stable pneumothorax.  CT no air leak.  CT surgery following closely hopefully can come off suction tomorrow.  He is on room air.  DM type 1 with uncontrolled hyperglycemia.  Managed with 70/30 insulin at home: Blood sugar uptrending/poorly controlled.  HbA1c poorly controlled 10.5.  On Lantus 35 units nightly-increase to 40 units cont 5 units Premeal and ssi Recent Labs  Lab 12/19/20 1210 12/19/20 1642 12/19/20 2103 12/20/20 0609 12/20/20 1217  GLUCAP 160* 74 297* 263* 246*    Lab Results  Component Value Date   HGBA1C 10.5 (H) 12/06/2020    AKI resolved.  Monitor renal function.  Polysubstance use disorder UDS positive for opiates benzos amphetamine THC.  Denied current drug use during admission.  Continue home risperidone Patient's Body mass index is 19.45 kg/m.  DVT prophylaxis: enoxaparin (LOVENOX) injection 40 mg Start: 12/11/20 2000 SCD's Start: 12/10/20 2048 Code Status:   Code Status: Full Code Family Communication: plan of care discussed with patient at bedside. Status is: Inpatient  Remains inpatient appropriate because: For ongoing management of pneumothorax with chest tube in place. Anticipated discharge to home once cleared by the CTS hopefully in 2-3 days  Objective: Vitals: Today's Vitals   12/19/20 2325 12/20/20 0345 12/20/20 0832 12/20/20 0856  BP:  111/86 122/87   Pulse:  89 85   Resp:  18 18   Temp:  97.9 F (36.6 C) 98.8 F (37.1 C)   TempSrc:  Oral Oral   SpO2:  99% 98%   Weight:      Height:      PainSc: 3  3   6     Physical Examination: General  exam: Aa0x3, thin, weak,older than stated age. HEENT:Oral mucosa moist, Ear/Nose WNL grossly,dentition normal. Respiratory system: B/l diminished, chest tube in place BS, no use of accessory muscle, non tender. Cardiovascular system: S1 & S2 +,No JVD. Gastrointestinal system: Abdomen soft, NT,ND, BS+. Nervous System:Alert, awake, moving  extremities. Extremities: edema none, distal peripheral pulses palpable.  Skin: No rashes, no icterus. MSK: Normal muscle bulk, tone, power.  Medications reviewed:  Scheduled Meds:  bisacodyl  10 mg Oral Daily   enoxaparin (LOVENOX) injection  40 mg Subcutaneous Daily   insulin aspart  0-5 Units Subcutaneous QHS   insulin aspart  0-9 Units Subcutaneous TID WC   insulin aspart  5 Units Subcutaneous TID WC   insulin glargine-yfgn  35 Units Subcutaneous QHS   lidocaine  1 patch Transdermal Q24H   risperiDONE  3 mg Oral Daily   senna-docusate  1 tablet Oral QHS   Continuous Infusions:  Diet Order             Diet - low sodium heart healthy           Diet heart healthy/carb modified Room service appropriate? Yes; Fluid consistency: Thin  Diet effective now                          Intake/Output  Intake/Output Summary (Last 24 hours) at 12/20/2020 0935 Last data filed at 12/20/2020 0833 Gross per 24 hour  Intake 480 ml  Output 753 ml  Net -273 ml   Intake/Output from previous day: 10/15 0701 - 10/16 0700 In: 240 [P.O.:240] Out: 1503 [Urine:1500; Chest Tube:3] Net IO Since Admission: -8,648 mL [12/20/20 0935]   Weight change:   Wt Readings from Last 3 Encounters:  12/15/20 63.2 kg  09/16/18 63.5 kg  08/24/18 59 kg     Consultants:see note  Procedures:see note Antimicrobials: Anti-infectives (From admission, onward)    Start     Dose/Rate Route Frequency Ordered Stop   12/10/20 1530  ceFAZolin (ANCEF) IVPB 2g/100 mL premix        2 g 200 mL/hr over 30 Minutes Intravenous  Once 12/10/20 1521 12/10/20 1625   12/10/20 1523  ceFAZolin (ANCEF) 2-4 GM/100ML-% IVPB       Note to Pharmacy: Cameron Sprang   : cabinet override      12/10/20 1523 12/10/20 1638   12/06/20 1800  azithromycin (ZITHROMAX) 500 mg in sodium chloride 0.9 % 250 mL IVPB  Status:  Discontinued        500 mg 250 mL/hr over 60 Minutes Intravenous Every 24 hours 12/05/20 2309 12/07/20 1354    12/06/20 1700  cefTRIAXone (ROCEPHIN) 1 g in sodium chloride 0.9 % 100 mL IVPB  Status:  Discontinued        1 g 200 mL/hr over 30 Minutes Intravenous Every 24 hours 12/05/20 2309 12/15/20 1628   12/05/20 1745  cefTRIAXone (ROCEPHIN) 1 g in sodium chloride 0.9 % 100 mL IVPB        1 g 200 mL/hr over 30 Minutes Intravenous  Once 12/05/20 1730 12/05/20 1854   12/05/20 1745  azithromycin (ZITHROMAX) 500 mg in sodium chloride 0.9 % 250 mL IVPB        500 mg 250 mL/hr over 60 Minutes Intravenous  Once 12/05/20 1730 12/05/20 2118      Culture/Microbiology    Component Value Date/Time   SDES  05/07/2017 2150    THROAT Performed at [facility], [address], [place], [place]  [place]  05/07/2017 2150    NONE Reflexed from [id number] Performed at [facility], [address] [place], [place] [address]    CULT  05/07/2017 2150    NO GROUP A STREP (S.PYOGENES) ISOLATED Performed at [facility], [place] [address], [place], [place] [address]    REPTSTATUS 05/10/2017 FINAL 05/07/2017 2150    Other culture-see note  Unresulted Labs (From admission, onward)    None      Data Reviewed: I have personally reviewed following labs and imaging studies CBC: Recent Labs  Lab 12/14/20 0913 12/15/20 0339 12/19/20 0617 12/20/20 0147  WBC  --   --  9.9 10.5  HGB 12.2* 12.1* 12.3* 12.2*  HCT 39.2 37.6* 37.9* 37.9*  MCV  --   --  92.0 94.0  PLT  --   --  454* 881*   Basic Metabolic Panel: Recent Labs  Lab 12/19/20 0617 12/20/20 0147  NA 137 135  K 4.3 4.2  CL 102 100  CO2 27 28  GLUCOSE 169* 261*  BUN 30* 35*  CREATININE 1.13 1.10  CALCIUM 9.0 9.2  MG 1.9 1.9  PHOS 3.5 3.4   GFR: Estimated Creatinine Clearance: 90.2 mL/min (by C-G formula based on SCr of 1.1 mg/dL). Liver Function Tests: No results for input(s): AST, ALT, ALKPHOS, BILITOT, PROT, ALBUMIN in the last 168 hours. No results for input(s): LIPASE, AMYLASE in the last 168 hours. No results for  input(s): AMMONIA in the last 168 hours. Coagulation Profile: No results for input(s): INR, PROTIME in the last 168 hours. Cardiac Enzymes: No results for input(s): CKTOTAL, CKMB, CKMBINDEX, TROPONINI in the last 168 hours. BNP (last 3 results) No results for input(s): PROBNP in the last 8760 hours. HbA1C: No results for input(s): HGBA1C in the last 72 hours. CBG: Recent Labs  Lab 12/19/20 0617 12/19/20 1210 12/19/20 1642 12/19/20 2103 12/20/20 0609  GLUCAP 155* 160* 74 297* 263*   Lipid Profile: No results for input(s): CHOL, HDL, LDLCALC, TRIG, CHOLHDL, LDLDIRECT in the last 72 hours. Thyroid Function Tests: No results for input(s): TSH, T4TOTAL, FREET4, T3FREE, THYROIDAB in the last 72 hours. Anemia Panel: No results for input(s): VITAMINB12, FOLATE, FERRITIN, TIBC, IRON, RETICCTPCT in the last 72 hours. Sepsis Labs: No results for input(s): PROCALCITON, LATICACIDVEN in the last 168 hours.  Recent Results (from the past 240 hour(s))  Surgical pcr screen     Status: Abnormal   Collection Time: 12/10/20  2:40 PM   Specimen: Nasal Mucosa; Nasal Swab  Result Value Ref Range Status   MRSA, PCR NEGATIVE NEGATIVE Final   Staphylococcus aureus POSITIVE (A) NEGATIVE Final    Comment: (NOTE) The Xpert SA Assay (FDA approved for NASAL specimens in patients 33 years of age and older), is one component of a comprehensive surveillance program. It is not intended to diagnose infection nor to guide or monitor treatment. Performed at [facility], [place] [address] [place], [place] [address]      Radiology Studies: DG Chest 1 View  Result Date: 12/19/2020 CLINICAL DATA:  Chest tube placement, pneumothorax. EXAM: CHEST  1 VIEW COMPARISON:  Chest x-ray dated 12/18/2020. FINDINGS: RIGHT-sided chest tube is stable in position. Stable appearance of the RIGHT-sided pneumothorax, small to moderate in size. Probable mild atelectasis at the RIGHT lung base. LEFT lung is clear. Heart  size is normal. IMPRESSION: Stable appearance of the RIGHT-sided pneumothorax, small to moderate in size. RIGHT-sided chest tube is stable in position. Probable  mild atelectasis at the RIGHT lung base. Electronically Signed   By: Franki Cabot M.D.   On: 12/19/2020 10:14   DG Chest 2 View  Result Date: 12/20/2020 CLINICAL DATA:  Follow-up exam, chest pain. EXAM: CHEST - 2 VIEW COMPARISON:  Chest x-rays dated 12/19/2020 and 12/18/2020. FINDINGS: Heart size and mediastinal contours are stable. Suture line along the upper RIGHT mediastinal margin. RIGHT-sided chest tube is stable in position. Stable appearance of the pneumothorax and pleural fluid/thickening at the RIGHT lung apex, adjacent to the chest tube and suture line. LEFT lung remains clear. IMPRESSION: Stable chest x-ray. Stable small pneumothorax and pleural fluid/thickening at the RIGHT lung apex. Adjacent RIGHT-sided chest tube is stable in position with tip at the medial aspect of the RIGHT lung apex. Electronically Signed   By: Franki Cabot M.D.   On: 12/20/2020 06:58     LOS: 15 days   Antonieta Pert, MD [facility]  12/20/2020, 9:35 AM

## 2020-12-21 ENCOUNTER — Inpatient Hospital Stay (HOSPITAL_COMMUNITY): Payer: BC Managed Care – PPO

## 2020-12-21 DIAGNOSIS — J9311 Primary spontaneous pneumothorax: Secondary | ICD-10-CM | POA: Diagnosis not present

## 2020-12-21 LAB — GLUCOSE, CAPILLARY
Glucose-Capillary: 70 mg/dL (ref 70–99)
Glucose-Capillary: 226 mg/dL — ABNORMAL HIGH (ref 70–99)
Glucose-Capillary: 136 mg/dL — ABNORMAL HIGH (ref 70–99)
Glucose-Capillary: 248 mg/dL — ABNORMAL HIGH (ref 70–99)

## 2020-12-21 NOTE — Progress Notes (Signed)
Patient placed to water seal as ordered. Roxana Lai, Bettina Gavia RN

## 2020-12-21 NOTE — Progress Notes (Signed)
PROGRESS NOTE    [facility]  NFA:[id number] DOB: 08/13/1995 DOA: 12/05/2020 PCP: Neale Burly, MD   Chief Complaint  Patient presents with   Chest Pain   Brief Narrative/Hospital Course:  [name], 25 y.o. male with PMH of T1DM polysubstance abuse seen in the ED 12/05/20 at [facility] with tachycardia, tachypnea, chest pain, shortness of breath lightheadedness of several days, and in the ED found to have acute hypoxic respiratory failure SPO2 80% on room air, chest x-ray showing right pneumothorax large with tension physiology chest tube placed in the ED, and was admitted and chest tube planned to be managed by Dr. Arnoldo Morale at [facility].  But patient had worsening respiratory status repeat chest x-ray done by [facility]A chest showed extensive diffuse right lung airspace disease mild present edema, infection or hemorrhoids, discussed with PCCM started IV Lasix, chest tube to continuous suction, consulted CT surgery Dr. Mohammed Kindle and transferred to [facility]. Had [facility] patient underwent robotic assisted thorascopic, wedge resection and apical pleurectomy, mechanical pleurodesis and intercostal nerve block on 12/10/20. On 10/12 and 10/13 patient had worsening and enlarging pneumothorax. Patient is managed with suction.  Overall clinically improving, at this time off oxygen and on room air.   Subjective:  Resting comfortably in bed mildly somnolent but able to interact well move all his extremities.  Has not been up from bed in several days. On pain medication IV morphine and oxycodone and tramadol as needed  Assessment & Plan:  Primary spontaneous pneumothorax: Initial chest tube insertion in ED 10/1> transferred to [facility] seen by CT VS and with worsening and continuous air leak s/p robotic assisted thorascopic, wedge resection and apical pleurectomy, mechanical pleurodesis and intercostal nerve block on 12/10/20. Chest x-ray appears stable with a small pneumothorax and pleural fluid/thickening at the  right lung apex, subcutaneous air stable.  CT no air leak with cough.  Cardiothoracic surgery following, continue current plan, trying waterseal today.  Continue pain control with oral/IV opiates, Tylenol, lidocaine.  Doing well on room air.  DM type 1 with uncontrolled hyperglycemia.  Managed with 70/30 insulin at home: Blood sugar fluctuating,hbA1c poorly controlled 10.5 indicating poor op control .continue current Lantus 40 units , 5 units Premeal and ssi Recent Labs  Lab 12/20/20 0609 12/20/20 1217 12/20/20 1724 12/20/20 2134 12/21/20 0611  GLUCAP 263* 246* 273* 263* 70     Lab Results  Component Value Date   HGBA1C 10.5 (H) 12/06/2020    AKI resolved.  Monitor renal function.  Polysubstance use disorder UDS positive for opiates benzos amphetamine THC.  Denied current drug use during admission.  Continue home risperidone  Patient's Body mass index is 19.45 kg/m.  DVT prophylaxis: enoxaparin (LOVENOX) injection 40 mg Start: 12/11/20 2000 SCD's Start: 12/10/20 2048 Code Status:   Code Status: Full Code Family Communication: plan of care discussed with patient at bedside. Status is: Inpatient  Remains inpatient appropriate because: For ongoing management of pneumothorax with chest tube in place. Anticipated discharge to home once cleared by the TCTS  Objective: Vitals: Today's Vitals   12/20/20 2329 12/21/20 0340 12/21/20 0828 12/21/20 1055  BP: 114/81 117/76    Pulse: 90 86  95  Resp: 18 17    Temp: 98 F (36.7 C) 98.3 F (36.8 C)    TempSrc: Oral Oral    SpO2: 98% 98%    Weight:      Height:      PainSc: 4  3  6  Physical Examination: General exam: AAOx 3, thin frail ill looking HEENT:Oral mucosa moist, Ear/Nose WNL grossly, dentition normal. Respiratory system: bilaterally diminished, CT +, no use of accessory muscle Cardiovascular system: S1 & S2 +, No JVD,. Gastrointestinal system: Abdomen soft, NT,ND, BS+ Nervous System:Alert, awake, moving extremities  and grossly nonfocal Extremities: no edema, distal peripheral pulses palpable.  Skin: No rashes,no icterus. MSK: Normal muscle bulk,tone, power   Medications reviewed:  Scheduled Meds:  bisacodyl  10 mg Oral Daily   enoxaparin (LOVENOX) injection  40 mg Subcutaneous Daily   insulin aspart  0-5 Units Subcutaneous QHS   insulin aspart  0-9 Units Subcutaneous TID WC   insulin aspart  5 Units Subcutaneous TID WC   insulin glargine-yfgn  40 Units Subcutaneous QHS   lidocaine  1 patch Transdermal Q24H   risperiDONE  3 mg Oral Daily   senna-docusate  1 tablet Oral QHS   Continuous Infusions:  Diet Order             Diet - low sodium heart healthy           Diet heart healthy/carb modified Room service appropriate? Yes; Fluid consistency: Thin  Diet effective now                          Intake/Output  Intake/Output Summary (Last 24 hours) at 12/21/2020 1146 Last data filed at 12/21/2020 0800 Gross per 24 hour  Intake 240 ml  Output 1600 ml  Net -1360 ml    Intake/Output from previous day: 10/16 0701 - 10/17 0700 In: 480 [P.O.:480] Out: 1550 [Urine:1550] Net IO Since Admission: -10,758 mL [12/21/20 1146]   Weight change:   Wt Readings from Last 3 Encounters:  12/15/20 63.2 kg  09/16/18 63.5 kg  08/24/18 59 kg     Consultants:see note  Procedures:see note Antimicrobials: Anti-infectives (From admission, onward)    Start     Dose/Rate Route Frequency Ordered Stop   12/10/20 1530  ceFAZolin (ANCEF) IVPB 2g/100 mL premix        2 g 200 mL/hr over 30 Minutes Intravenous  Once 12/10/20 1521 12/10/20 1625   12/10/20 1523  ceFAZolin (ANCEF) 2-4 GM/100ML-% IVPB       Note to Pharmacy: Cameron Sprang   : cabinet override      12/10/20 1523 12/10/20 1638   12/06/20 1800  azithromycin (ZITHROMAX) 500 mg in sodium chloride 0.9 % 250 mL IVPB  Status:  Discontinued        500 mg 250 mL/hr over 60 Minutes Intravenous Every 24 hours 12/05/20 2309 12/07/20 1354    12/06/20 1700  cefTRIAXone (ROCEPHIN) 1 g in sodium chloride 0.9 % 100 mL IVPB  Status:  Discontinued        1 g 200 mL/hr over 30 Minutes Intravenous Every 24 hours 12/05/20 2309 12/15/20 1628   12/05/20 1745  cefTRIAXone (ROCEPHIN) 1 g in sodium chloride 0.9 % 100 mL IVPB        1 g 200 mL/hr over 30 Minutes Intravenous  Once 12/05/20 1730 12/05/20 1854   12/05/20 1745  azithromycin (ZITHROMAX) 500 mg in sodium chloride 0.9 % 250 mL IVPB        500 mg 250 mL/hr over 60 Minutes Intravenous  Once 12/05/20 1730 12/05/20 2118      Culture/Microbiology    Component Value Date/Time   SDES  05/07/2017 2150    THROAT Performed at [facility], [address] [place],  [place] [address]    SPECREQUEST  05/07/2017 2150    NONE Reflexed from [id number] Performed at [facility], [address] [place], [place] [address]    CULT  05/07/2017 2150    NO GROUP A STREP (S.PYOGENES) ISOLATED Performed at [facility], [place] [address] [place], [place] [address]    REPTSTATUS 05/10/2017 FINAL 05/07/2017 2150    Other culture-see note  Unresulted Labs (From admission, onward)    None      Data Reviewed: I have personally reviewed following labs and imaging studies CBC: Recent Labs  Lab 12/15/20 0339 12/19/20 0617 12/20/20 0147  WBC  --  9.9 10.5  HGB 12.1* 12.3* 12.2*  HCT 37.6* 37.9* 37.9*  MCV  --  92.0 94.0  PLT  --  454* 465*    Basic Metabolic Panel: Recent Labs  Lab 12/19/20 0617 12/20/20 0147  NA 137 135  K 4.3 4.2  CL 102 100  CO2 27 28  GLUCOSE 169* 261*  BUN 30* 35*  CREATININE 1.13 1.10  CALCIUM 9.0 9.2  MG 1.9 1.9  PHOS 3.5 3.4    GFR: Estimated Creatinine Clearance: 90.2 mL/min (by C-G formula based on SCr of 1.1 mg/dL). Liver Function Tests: No results for input(s): AST, ALT, ALKPHOS, BILITOT, PROT, ALBUMIN in the last 168 hours. No results for input(s): LIPASE, AMYLASE in the last 168 hours. No results for input(s): AMMONIA in the last 168  hours. Coagulation Profile: No results for input(s): INR, PROTIME in the last 168 hours. Cardiac Enzymes: No results for input(s): CKTOTAL, CKMB, CKMBINDEX, TROPONINI in the last 168 hours. BNP (last 3 results) No results for input(s): PROBNP in the last 8760 hours. HbA1C: No results for input(s): HGBA1C in the last 72 hours. CBG: Recent Labs  Lab 12/20/20 0609 12/20/20 1217 12/20/20 1724 12/20/20 2134 12/21/20 0611  GLUCAP 263* 246* 273* 263* 70    Lipid Profile: No results for input(s): CHOL, HDL, LDLCALC, TRIG, CHOLHDL, LDLDIRECT in the last 72 hours. Thyroid Function Tests: No results for input(s): TSH, T4TOTAL, FREET4, T3FREE, THYROIDAB in the last 72 hours. Anemia Panel: No results for input(s): VITAMINB12, FOLATE, FERRITIN, TIBC, IRON, RETICCTPCT in the last 72 hours. Sepsis Labs: No results for input(s): PROCALCITON, LATICACIDVEN in the last 168 hours.  No results found for this or any previous visit (from the past 240 hour(s)).    Radiology Studies: DG Chest 2 View  Result Date: 12/20/2020 CLINICAL DATA:  Follow-up exam, chest pain. EXAM: CHEST - 2 VIEW COMPARISON:  Chest x-rays dated 12/19/2020 and 12/18/2020. FINDINGS: Heart size and mediastinal contours are stable. Suture line along the upper RIGHT mediastinal margin. RIGHT-sided chest tube is stable in position. Stable appearance of the pneumothorax and pleural fluid/thickening at the RIGHT lung apex, adjacent to the chest tube and suture line. LEFT lung remains clear. IMPRESSION: Stable chest x-ray. Stable small pneumothorax and pleural fluid/thickening at the RIGHT lung apex. Adjacent RIGHT-sided chest tube is stable in position with tip at the medial aspect of the RIGHT lung apex. Electronically Signed   By: Franki Cabot M.D.   On: 12/20/2020 06:58   DG CHEST PORT 1 VIEW  Result Date: 12/21/2020 CLINICAL DATA:  Surgery follow-up. EXAM: PORTABLE CHEST 1 VIEW COMPARISON:  12/20/2020 FINDINGS: The  cardiomediastinal silhouette is unchanged. Postoperative changes are again seen in the right lung with associated volume loss, a small right apical pneumothorax, and small right apical pleural fluid or thickening. A right chest tube is unchanged. The  left lung remains clear. IMPRESSION: Unchanged appearance of the chest including a small right apical pneumothorax. Electronically Signed   By: Logan Bores M.D.   On: 12/21/2020 08:59     LOS: 16 days   Antonieta Pert, MD [facility]  12/21/2020, 11:46 AM

## 2020-12-21 NOTE — Progress Notes (Addendum)
      [address]       [place] [address]             [phone]      11 Days Post-Op Procedure(s) (LRB): XI ROBOTIC ASSISTED THORASCOPY-WEDGE RESECTION (Right) APICAL PLEURECTOMY (Right) MECHANICAL PLEURADESIS (Right) INTERCOSTAL NERVE BLOCK (Right) Subjective: Feels okay this morning. Pain has been well controlled.   Objective: Vital signs in last 24 hours: Temp:  [97.9 F (36.6 C)-98.8 F (37.1 C)] 98.3 F (36.8 C) (10/17 0340) Pulse Rate:  [79-99] 86 (10/17 0340) Cardiac Rhythm: Normal sinus rhythm (10/16 1945) Resp:  [17-18] 17 (10/17 0340) BP: (114-122)/(76-87) 117/76 (10/17 0340) SpO2:  [97 %-98 %] 98 % (10/17 0340)     Intake/Output from previous day: 10/16 0701 - 10/17 0700 In: 480 [P.O.:480] Out: 1550 [Urine:1550] Intake/Output this shift: No intake/output data recorded.  General appearance: alert, cooperative, and no distress Heart: regular rate and rhythm, S1, S2 normal, no murmur, click, rub or gallop Lungs: clear to auscultation bilaterally Abdomen: soft, non-tender; bowel sounds normal; no masses,  no organomegaly Extremities: extremities normal, atraumatic, no cyanosis or edema Wound: clean and dry  Lab Results: Recent Labs    12/19/20 0617 12/20/20 0147  WBC 9.9 10.5  HGB 12.3* 12.2*  HCT 37.9* 37.9*  PLT 454* 465*   BMET:  Recent Labs    12/19/20 0617 12/20/20 0147  NA 137 135  K 4.3 4.2  CL 102 100  CO2 27 28  GLUCOSE 169* 261*  BUN 30* 35*  CREATININE 1.13 1.10  CALCIUM 9.0 9.2    PT/INR: No results for input(s): LABPROT, INR in the last 72 hours. ABG    Component Value Date/Time   PHART 7.336 (L) 04/30/2014 1215   HCO3 28.9 (H) 09/16/2018 1338   TCO2 11.3 04/30/2014 1215   ACIDBASEDEF 14.5 (H) 11/09/2016 1745   O2SAT 90.7 09/16/2018 1338   CBG (last 3)  Recent Labs    12/20/20 1724 12/20/20 2134 12/21/20 0611  GLUCAP 273* 263* 70    Assessment/Plan: S/P Procedure(s) (LRB): XI ROBOTIC  ASSISTED THORASCOPY-WEDGE RESECTION (Right) APICAL PLEURECTOMY (Right) MECHANICAL PLEURADESIS (Right) INTERCOSTAL NERVE BLOCK (Right)  1 CV- NSR in the 80s, BP well controlled.  2 sats good on RA 3 CXR-Stable chest x-ray. Stable small pneumothorax and pleural fluid/thickening at the RIGHT lung apex. Subq air has been stable.  4 CT no air leak with cough 5 cont current plan , try water seal today.    LOS: 16 days    Pedro [name] 12/21/2020   Waterseal today If stable will discharge tomorrow on [place]

## 2020-12-21 NOTE — Progress Notes (Signed)
Mobility Specialist Progress Note:   12/21/20 1055  Therapy Vitals  Pulse Rate 95  Mobility  Activity Ambulated in hall  Level of Assistance Independent  Assistive Device None  Distance Ambulated (ft) 460 ft  Mobility Ambulated independently in hallway  Mobility Response Tolerated well  Mobility performed by Mobility specialist  $Mobility charge 1 Mobility   Pre Mobility: HR 95 bpm During Mobility: HR 126 bpm Post Mobility: HR 109 bpm  Pt received in bed, agreed to mobility. Ambulated independently in hallway 43' with no AD. Pt eager to go home. Left sitting EOB with all needs met.  Nelta Numbers Mobility Specialist  Phone [phone]

## 2020-12-22 ENCOUNTER — Inpatient Hospital Stay (HOSPITAL_COMMUNITY): Payer: BC Managed Care – PPO

## 2020-12-22 LAB — GLUCOSE, CAPILLARY
Glucose-Capillary: 61 mg/dL — ABNORMAL LOW (ref 70–99)
Glucose-Capillary: 67 mg/dL — ABNORMAL LOW (ref 70–99)
Glucose-Capillary: 55 mg/dL — ABNORMAL LOW (ref 70–99)
Glucose-Capillary: 76 mg/dL (ref 70–99)
Glucose-Capillary: 365 mg/dL — ABNORMAL HIGH (ref 70–99)

## 2020-12-22 MED ORDER — IBUPROFEN 200 MG PO TABS: 400.0000 mg | ORAL_TABLET | Freq: Four times a day (QID) | ORAL | 2 refills | Status: DC | PRN

## 2020-12-22 MED ORDER — INSULIN GLARGINE-YFGN 100 UNIT/ML ~~LOC~~ SOLN
20.0000 [IU] | Freq: Every day | SUBCUTANEOUS | Status: DC
  Filled 2020-12-22: qty 0.2

## 2020-12-22 NOTE — Progress Notes (Signed)
Results for BASIM, BARTNIK (MRN [id number]) as of 12/22/2020 06:24  Ref. Range 12/21/2020 06:11 12/21/2020 12:12 12/21/2020 17:30 12/21/2020 21:20 12/22/2020 06:06  Glucose-Capillary Latest Ref Range: 70 - 99 mg/dL 70 226 (H) 136 (H) 248 (H) 61 (L)    CBG this 6 am is 61 mg/dl. Per RN day shift reported yesterday that Pt had low appetite. Orange juice given. His vital signs remains stable. We will monitor.  Kennyth Lose, RN

## 2020-12-22 NOTE — Progress Notes (Signed)
      [address]       [place] [address]             [phone]      The CXR this AM shows no pneumothorax and pleural tube is in stable condition. No air leak.  Will convert to a Mini Express collection system and he may be discharged to home with this device.  He is scheduled for follow up with Dr. Kipp Brood this Friday 10/21.  Will repeat the CXR at that time and likely remove the pleural tube in the office.   Macarthur Critchley, PA-C [phone]

## 2020-12-22 NOTE — Plan of Care (Signed)
  Problem: Education: Goal: Knowledge of General Education information will improve Description: Including pain rating scale, medication(s)/side effects and non-pharmacologic comfort measures Outcome: Adequate for Discharge   

## 2020-12-22 NOTE — Progress Notes (Signed)
      [address]       [place] [address]             [phone]      12 Days Post-Op Procedure(s) (LRB): XI ROBOTIC ASSISTED THORASCOPY-WEDGE RESECTION (Right) APICAL PLEURECTOMY (Right) MECHANICAL PLEURADESIS (Right) INTERCOSTAL NERVE BLOCK (Right) Subjective: Feels okay this morning, pain is well controlled on his current medication.   Objective: Vital signs in last 24 hours: Temp:  [97.8 F (36.6 C)-98.6 F (37 C)] 98.6 F (37 C) (10/18 0402) Pulse Rate:  [75-95] 76 (10/18 0402) Cardiac Rhythm: Normal sinus rhythm (10/18 0336) Resp:  [11-16] 11 (10/18 0402) BP: (110-115)/(73-83) 115/73 (10/18 0402) SpO2:  [98 %-99 %] 98 % (10/18 0402)     Intake/Output from previous day: 10/17 0701 - 10/18 0700 In: 490 [P.O.:490] Out: 1700 [Urine:1700] Intake/Output this shift: No intake/output data recorded.  General appearance: alert, cooperative, and no distress Heart: regular rate and rhythm, S1, S2 normal, no murmur, click, rub or gallop Lungs: clear to auscultation bilaterally Abdomen: soft, non-tender; bowel sounds normal; no masses,  no organomegaly Extremities: extremities normal, atraumatic, no cyanosis or edema Wound: clean and dry  Lab Results: Recent Labs    12/20/20 0147  WBC 10.5  HGB 12.2*  HCT 37.9*  PLT 465*   BMET:  Recent Labs    12/20/20 0147  NA 135  K 4.2  CL 100  CO2 28  GLUCOSE 261*  BUN 35*  CREATININE 1.10  CALCIUM 9.2    PT/INR: No results for input(s): LABPROT, INR in the last 72 hours. ABG    Component Value Date/Time   PHART 7.336 (L) 04/30/2014 1215   HCO3 28.9 (H) 09/16/2018 1338   TCO2 11.3 04/30/2014 1215   ACIDBASEDEF 14.5 (H) 11/09/2016 1745   O2SAT 90.7 09/16/2018 1338   CBG (last 3)  Recent Labs    12/21/20 2120 12/22/20 0606 12/22/20 0648  GLUCAP 248* 61* 67*    Assessment/Plan: S/P Procedure(s) (LRB): XI ROBOTIC ASSISTED THORASCOPY-WEDGE RESECTION (Right) APICAL PLEURECTOMY  (Right) MECHANICAL PLEURADESIS (Right) INTERCOSTAL NERVE BLOCK (Right)  1 CV- NSR in the 80s, BP well controlled.  2 sats good on RA 3 CXR-pending  4 Hypoglycemia- per attending.  5 cont current plan , depending on the CXR may either remove chest tube or attach to mini express.     LOS: 17 days    Elgie Collard 12/22/2020

## 2020-12-22 NOTE — Evaluation (Signed)
Physical Therapy Evaluation and DISCHARGE Patient Details Name: Pedro [name] MRN: [id number] DOB: 18-Mar-1995 Today's Date: 12/22/2020  History of Present Illness  Pt is a 25 yo admitted with tachycardia and tachypenia and was found to hav a R tension pneumothorax, s/p placement of pigtail chest tube, chest x-ray revealed re-expansion of R lung with development of post expansion pulmonary edema.   Pt underwent XI robotic assisted thorascopy-wedge resection on the R, apical pleurectomy on R, and mechanical pleuradessis on R on 10/6. PMH: DM1, polysubstance abouse, ADHD, herpes HTN   Clinical Impression  Pt admitted with above. Pt mobilizing independently without AD and without pain despite prolonged hospital stay. Pt's HR did increase to 156bpm s/p doing stairs. HR stayed in 130s during ambulation and returned to 90bpm upon 1 min of laying down. Pt with good home set up and support. Pt with good awareness of chest tube and insight to deficits and that he can not return to work for a while. Pt with no further acute PT needs at this time. PT signing off. Please re-consult if needed in future.       Recommendations for follow up therapy are one component of a multi-disciplinary discharge planning process, led by the attending physician.  Recommendations may be updated based on patient status, additional functional criteria and insurance authorization.  Follow Up Recommendations No PT follow up    Equipment Recommendations  None recommended by PT    Recommendations for Other Services       Precautions / Restrictions Precautions Precautions: Other (comment) Precaution Comments: chest tube in R flank Restrictions Weight Bearing Restrictions: No      Mobility  Bed Mobility Overal bed mobility: Independent             General bed mobility comments: no difficulty, HOB flat    Transfers Overall transfer level: Independent Equipment used: None             General transfer  comment: no physical assist or supervision needed, pt safe and with good line management of R chest tube  Ambulation/Gait Ambulation/Gait assistance: Modified independent (Device/Increase time) Gait Distance (Feet): 400 Feet Assistive device: None Gait Pattern/deviations: WFL(Within Functional Limits) Gait velocity: wfl Gait velocity interpretation: >2.62 ft/sec, indicative of community ambulatory General Gait Details: pt's HR incresaed to 150s s/p stair negotiation, reports feeling like heart was racing, stopped briefly to let HR dec, HR then into 130s for remainder of walk. Returned to 90 s/p 1 min of laying down  Stairs Stairs: Yes Stairs assistance: Modified independent (Device/Increase time) Stair Management: One rail Left;Alternating pattern Number of Stairs: 12 General stair comments: no difficulty or instability  Wheelchair Mobility    Modified Rankin (Stroke Patients Only)       Balance Overall balance assessment: Independent                                           Pertinent Vitals/Pain Pain Assessment: 0-10 Pain Score: 2  Pain Location: R chest tube site Pain Descriptors / Indicators: Sore Pain Intervention(s): Monitored during session    Home Living Family/patient expects to be discharged to:: Private residence Living Arrangements: Spouse/significant other;Children (8yo son) Available Help at Discharge: Family;Friend(s);Available 24 hours/day Type of Home: House (trailer) Home Access: Stairs to enter Entrance Stairs-Rails: Left Entrance Stairs-Number of Steps: 5 Home Layout: One level Home Equipment: None  Prior Function Level of Independence: Independent         Comments: works night shift     Hand Dominance   Dominant Hand: Right    Extremity/Trunk Assessment   Upper Extremity Assessment Upper Extremity Assessment: Overall WFL for tasks assessed    Lower Extremity Assessment Lower Extremity Assessment: Overall  WFL for tasks assessed    Cervical / Trunk Assessment Cervical / Trunk Assessment: Other exceptions Cervical / Trunk Exceptions: R chest tube  Communication   Communication: No difficulties  Cognition Arousal/Alertness: Awake/alert Behavior During Therapy: WFL for tasks assessed/performed Overall Cognitive Status: Within Functional Limits for tasks assessed                                        General Comments General comments (skin integrity, edema, etc.): pt with R chest tube intact, HR inc to 150s s/p stairs    Exercises     Assessment/Plan    PT Assessment Patent does not need any further PT services  PT Problem List         PT Treatment Interventions      PT Goals (Current goals can be found in the Care Plan section)  Acute Rehab PT Goals Patient Stated Goal: home PT Goal Formulation: All assessment and education complete, DC therapy    Frequency     Barriers to discharge        Co-evaluation               AM-PAC PT "6 Clicks" Mobility  Outcome Measure Help needed turning from your back to your side while in a flat bed without using bedrails?: None Help needed moving from lying on your back to sitting on the side of a flat bed without using bedrails?: None Help needed moving to and from a bed to a chair (including a wheelchair)?: None Help needed standing up from a chair using your arms (e.g., wheelchair or bedside chair)?: None Help needed to walk in hospital room?: None Help needed climbing 3-5 steps with a railing? : None 6 Click Score: 24    End of Session   Activity Tolerance: Patient tolerated treatment well Patient left: in bed;with call bell/phone within reach Nurse Communication: Mobility status PT Visit Diagnosis: Muscle weakness (generalized) (M62.81)    Time: 6073-7106 PT Time Calculation (min) (ACUTE ONLY): 10 min   Charges:   PT Evaluation $PT Eval Low Complexity: 1 Low          Kittie Plater, PT,  DPT Acute Rehabilitation Services Pager #: [phone] Office #: [phone]   Berline Lopes 12/22/2020, 1:43 PM

## 2020-12-22 NOTE — Progress Notes (Addendum)
Pt has been doing well after putting chest tube under water seal. No signs and symptoms of recurrent tension pneumothorax. SPO2 99%, no SOB. From auscultation: left lung clear, right lung clear with diminished breath sound. His pain is well tolerated. Pt requested Oxycodone PRN q 4 hrs. Chest tube has no air bubble leaking. Palpation: no subcutaneous emphysema found. Pt remains afebrile. He is hemodynamically stable tonight. NSR on monitor. Per RN day shift reported, his HR was up to 130s  when Pt was ambulating in his room.  We will continue to monitor.  Kennyth Lose, RN

## 2020-12-22 NOTE — Progress Notes (Deleted)
PROGRESS NOTE    Pedro [name]  KAJ:[id number] DOB: 02-01-1996 DOA: 12/05/2020 PCP: Neale Burly, MD   Chief Complaint  Patient presents with   Chest Pain   Brief Narrative/Hospital Course:  Pedro [name], 25 y.o. male with PMH of T1DM polysubstance abuse seen in the ED 12/05/20 at [facility] with tachycardia, tachypnea, chest pain, shortness of breath lightheadedness of several days, and in the ED found to have acute hypoxic respiratory failure SPO2 80% on room air, chest x-ray showing right pneumothorax large with tension physiology chest tube placed in the ED, and was admitted and chest tube planned to be managed by Dr. Arnoldo Morale at [facility].  But patient had worsening respiratory status repeat chest x-ray done by [facility]A chest showed extensive diffuse right lung airspace disease mild present edema, infection or hemorrhoids, discussed with PCCM started IV Lasix, chest tube to continuous suction, consulted CT surgery Dr. Mohammed Kindle and transferred to [facility]. Had [facility] patient underwent robotic assisted thorascopic, wedge resection and apical pleurectomy, mechanical pleurodesis and intercostal nerve block on 12/10/20. On 10/12 and 10/13 patient had worsening and enlarging pneumothorax. Patient is managed with suction.  Overall clinically improving, at this time off oxygen and on room air.   Subjective: Seen and examined.  Chest tube in waterseal this morning.  Chest x-ray stable right chest tube with no definite pneumothorax identified.  Assessment & Plan:  Primary spontaneous pneumothorax: Initial chest tube insertion in ED 10/1> transferred to [facility] seen by CT VS and with worsening and continuous air leak s/p robotic assisted thorascopic, wedge resection and apical pleurectomy, mechanical pleurodesis and intercostal nerve block on 12/10/20.  Chest rate this morning is stable, thoracic surgery planning to convert to mini express collection system-which patient can take home.  I would like to see how  he does with PT OT, he has not been up from bed in many days. Continue pain control minimize narcotics.  DM type 1 with uncontrolled hyperglycemia.  Managed with 70/30 insulin at home: Blood sugar fluctuating,hbA1c poorly controlled 10.5 indicating poor op control .hypoglycemia overnight this morning in the 60s.  Decrease Lantus to 20 units cont  5 units Premeal and ssi Recent Labs  Lab 12/21/20 2120 12/22/20 0606 12/22/20 0648 12/22/20 0747 12/22/20 0810  GLUCAP 248* 61* 67* 55* 48     Lab Results  Component Value Date   HGBA1C 10.5 (H) 12/06/2020    AKI resolved.  Monitor renal function.  Polysubstance use disorder UDS positive for opiates benzos amphetamine THC.  Denied current drug use during admission.  Continue home risperidone  Patient's Body mass index is 19.45 kg/m.  DVT prophylaxis: enoxaparin (LOVENOX) injection 40 mg Start: 12/11/20 2000 SCD's Start: 12/10/20 2048 refusing Lovenox keep on SCD Code Status:   Code Status: Full Code Family Communication: plan of care discussed with patient at bedside. Status is: Inpatient  Remains inpatient appropriate because: For ongoing management of pneumothorax with chest tube in place. Anticipated discharge to home likely tomorrow blood sugar remains stable and able to ambulate well   Objective: Vitals: Today's Vitals   12/22/20 0619 12/22/20 0704 12/22/20 0818 12/22/20 0909  BP:   116/78   Pulse:   81   Resp:   16   Temp:   97.7 F (36.5 C)   TempSrc:   Oral   SpO2:   100%   Weight:      Height:      PainSc: 7  Asleep  3    Physical  Examination: General exam: AAOx 3, ill looking,  older than stated age, weak appearing. HEENT:Oral mucosa moist, Ear/Nose WNL grossly, dentition normal. Respiratory system: bilaterally diminished at base no use of accessory muscle Cardiovascular system: S1 & S2 +, No JVD,. Gastrointestinal system: Abdomen soft, NT,ND, BS+ Nervous System:Alert, awake, moving extremities and grossly  nonfocal Extremities: No edema, distal peripheral pulses palpable.  Skin: No rashes,no icterus. MSK: Normal muscle bulk,tone, power   Medications reviewed:  Scheduled Meds:  bisacodyl  10 mg Oral Daily   enoxaparin (LOVENOX) injection  40 mg Subcutaneous Daily   insulin aspart  0-5 Units Subcutaneous QHS   insulin aspart  0-9 Units Subcutaneous TID WC   insulin aspart  5 Units Subcutaneous TID WC   insulin glargine-yfgn  40 Units Subcutaneous QHS   lidocaine  1 patch Transdermal Q24H   risperiDONE  3 mg Oral Daily   senna-docusate  1 tablet Oral QHS   Continuous Infusions:  Diet Order             Diet - low sodium heart healthy           Diet heart healthy/carb modified Room service appropriate? Yes; Fluid consistency: Thin  Diet effective now                          Intake/Output  Intake/Output Summary (Last 24 hours) at 12/22/2020 1033 Last data filed at 12/22/2020 0620 Gross per 24 hour  Intake 490 ml  Output 900 ml  Net -410 ml    Intake/Output from previous day: 10/17 0701 - 10/18 0700 In: 53 [P.O.:490] Out: 1700 [Urine:1700] Net IO Since Admission: -11,168 mL [12/22/20 1033]   Weight change:   Wt Readings from Last 3 Encounters:  12/15/20 63.2 kg  09/16/18 63.5 kg  08/24/18 59 kg     Consultants:see note  Procedures:see note Antimicrobials: Anti-infectives (From admission, onward)    Start     Dose/Rate Route Frequency Ordered Stop   12/10/20 1530  ceFAZolin (ANCEF) IVPB 2g/100 mL premix        2 g 200 mL/hr over 30 Minutes Intravenous  Once 12/10/20 1521 12/10/20 1625   12/10/20 1523  ceFAZolin (ANCEF) 2-4 GM/100ML-% IVPB       Note to Pharmacy: Cameron Sprang   : cabinet override      12/10/20 1523 12/10/20 1638   12/06/20 1800  azithromycin (ZITHROMAX) 500 mg in sodium chloride 0.9 % 250 mL IVPB  Status:  Discontinued        500 mg 250 mL/hr over 60 Minutes Intravenous Every 24 hours 12/05/20 2309 12/07/20 1354   12/06/20 1700   cefTRIAXone (ROCEPHIN) 1 g in sodium chloride 0.9 % 100 mL IVPB  Status:  Discontinued        1 g 200 mL/hr over 30 Minutes Intravenous Every 24 hours 12/05/20 2309 12/15/20 1628   12/05/20 1745  cefTRIAXone (ROCEPHIN) 1 g in sodium chloride 0.9 % 100 mL IVPB        1 g 200 mL/hr over 30 Minutes Intravenous  Once 12/05/20 1730 12/05/20 1854   12/05/20 1745  azithromycin (ZITHROMAX) 500 mg in sodium chloride 0.9 % 250 mL IVPB        500 mg 250 mL/hr over 60 Minutes Intravenous  Once 12/05/20 1730 12/05/20 2118      Culture/Microbiology    Component Value Date/Time   SDES  05/07/2017 2150    THROAT Performed at [facility],  [address] [place], [place] [address]    SPECREQUEST  05/07/2017 2150    NONE Reflexed from [id number] Performed at [facility], [address], [place], [place] [address]    CULT  05/07/2017 2150    NO GROUP A STREP (S.PYOGENES) ISOLATED Performed at [facility], [place] [address] [place], [place] [address]    REPTSTATUS 05/10/2017 FINAL 05/07/2017 2150    Other culture-see note  Unresulted Labs (From admission, onward)    None      Data Reviewed: I have personally reviewed following labs and imaging studies CBC: Recent Labs  Lab 12/19/20 0617 12/20/20 0147  WBC 9.9 10.5  HGB 12.3* 12.2*  HCT 37.9* 37.9*  MCV 92.0 94.0  PLT 454* 465*    Basic Metabolic Panel: Recent Labs  Lab 12/19/20 0617 12/20/20 0147  NA 137 135  K 4.3 4.2  CL 102 100  CO2 27 28  GLUCOSE 169* 261*  BUN 30* 35*  CREATININE 1.13 1.10  CALCIUM 9.0 9.2  MG 1.9 1.9  PHOS 3.5 3.4    GFR: Estimated Creatinine Clearance: 90.2 mL/min (by C-G formula based on SCr of 1.1 mg/dL). Liver Function Tests: No results for input(s): AST, ALT, ALKPHOS, BILITOT, PROT, ALBUMIN in the last 168 hours. No results for input(s): LIPASE, AMYLASE in the last 168 hours. No results for input(s): AMMONIA in the last 168 hours. Coagulation Profile: No results for input(s): INR,  PROTIME in the last 168 hours. Cardiac Enzymes: No results for input(s): CKTOTAL, CKMB, CKMBINDEX, TROPONINI in the last 168 hours. BNP (last 3 results) No results for input(s): PROBNP in the last 8760 hours. HbA1C: No results for input(s): HGBA1C in the last 72 hours. CBG: Recent Labs  Lab 12/21/20 2120 12/22/20 0606 12/22/20 0648 12/22/20 0747 12/22/20 0810  GLUCAP 248* 61* 67* 55* 76    Lipid Profile: No results for input(s): CHOL, HDL, LDLCALC, TRIG, CHOLHDL, LDLDIRECT in the last 72 hours. Thyroid Function Tests: No results for input(s): TSH, T4TOTAL, FREET4, T3FREE, THYROIDAB in the last 72 hours. Anemia Panel: No results for input(s): VITAMINB12, FOLATE, FERRITIN, TIBC, IRON, RETICCTPCT in the last 72 hours. Sepsis Labs: No results for input(s): PROCALCITON, LATICACIDVEN in the last 168 hours.  No results found for this or any previous visit (from the past 240 hour(s)).    Radiology Studies: DG Chest Port 1 View  Result Date: 12/22/2020 CLINICAL DATA:  Chest tube in place.  Pt denies SOB and chest pain. EXAM: PORTABLE CHEST - 1 VIEW COMPARISON:  the previous day's study FINDINGS: Right multi sidehole chest tube directed towards the lung apex. No definite pneumothorax is evident. Changes of right upper lung surgical resection. Left lung clear. Heart size and mediastinal contours are within normal limits. No effusion. Visualized bones unremarkable. Stable right lateral subcutaneous emphysema. IMPRESSION: 1. Stable right chest tube with no definite pneumothorax identified. Electronically Signed   By: Lucrezia Europe M.D.   On: 12/22/2020 08:16   DG CHEST PORT 1 VIEW  Result Date: 12/21/2020 CLINICAL DATA:  Surgery follow-up. EXAM: PORTABLE CHEST 1 VIEW COMPARISON:  12/20/2020 FINDINGS: The cardiomediastinal silhouette is unchanged. Postoperative changes are again seen in the right lung with associated volume loss, a small right apical pneumothorax, and small right apical pleural  fluid or thickening. A right chest tube is unchanged. The left lung remains clear. IMPRESSION: Unchanged appearance of the chest including a small right apical pneumothorax. Electronically Signed   By: Logan Bores M.D.   On:  12/21/2020 08:59     LOS: 17 days   Antonieta Pert, MD [facility]  12/22/2020, 10:33 AM

## 2020-12-22 NOTE — Progress Notes (Signed)
Inpatient Diabetes Program Recommendations  AACE/ADA: New Consensus Statement on Inpatient Glycemic Control (2015)  Target Ranges:  Prepandial:   less than 140 mg/dL      Peak postprandial:   less than 180 mg/dL (1-2 hours)      Critically ill patients:  140 - 180 mg/dL   Lab Results  Component Value Date   GLUCAP 365 (H) 12/22/2020   HGBA1C 10.5 (H) 12/06/2020    Review of Glycemic Control Results for Pedro [name], Pedro [name] (MRN [id number]) as of 12/22/2020 15:20  Ref. Range 12/22/2020 06:48 12/22/2020 07:47 12/22/2020 08:10 12/22/2020 12:03  Glucose-Capillary Latest Ref Range: 70 - 99 mg/dL 67 (L) 55 (L) 76 365 (H)   Diabetes history: Type 1 DM Outpatient Diabetes medications: Novolog 70/30 40 units BID Current orders for Inpatient glycemic control: Semglee 20 units QHs, Novolog 0-9 units TID, Novolog 0-5 units QHS  Inpatient Diabetes Program Recommendations:    Spoke with patient regarding home diabetes management. Patient gets insulin from [facility] d/t cost.  Reviewed patient's current A1c of 10.5%. Explained what a A1c is and what it measures. Also reviewed goal A1c with patient, importance of good glucose control @ home, and blood sugar goals. Reviewed patho of DM, current inpatient dosages compared to outpatient dosages, infection risk with poor glycemic control, vascular changes and commorbidities. Patient will need a meter at discharge. Blood glucose meter kit (#[id number]). Discussed recommended frequency for checking CBGs and anticipated times per day that he would be elevated. Patient works night shift and verifies outpatient dosing. Discussed Freestyle Libre as an option outpatient as well and reviewed cost, application and sensors; patient is interested and orders received from MD.  Applied to patient's left upper arm. Reviewed role of outpatient endocrinology and attached a list to dc summary.   Thanks, Bronson Curb, MSN, RNC-OB Diabetes Coordinator [phone]  (8a-5p)

## 2020-12-22 NOTE — Progress Notes (Signed)
Discharge instructions (including medications) discussed with and copy provided to patient/caregiver 

## 2020-12-22 NOTE — Progress Notes (Signed)
Mobility Specialist Progress Note:   12/22/20 1035  Therapy Vitals  Pulse Rate 82  Mobility  Activity Ambulated in hall  Level of Assistance Independent  Assistive Device None  Distance Ambulated (ft) 460 ft  Mobility Ambulated independently in hallway  Mobility Response Tolerated well  Mobility performed by Mobility specialist   Pre Mobility: HR 81 bpm During Mobility: HR 115 bpm during amb; 149 bpm in BR Post Mobility: HR 107 bpm  Pt received in bed, agreed to mobility. Ambulated in hallway 460' with no AD. Pt asx during amb. HR elevated to 149 bpm while in the BR when returning to room. Pt left standing in room with NT changing linens.   Nelta Numbers Mobility Specialist  Phone [phone]

## 2020-12-22 NOTE — Discharge Summary (Signed)
Physician Discharge Summary  ALLISON SILVA KHV:[id number] DOB: 1995/11/16 DOA: 12/05/2020  PCP: Neale Burly, MD  Admit date: 12/05/2020 Discharge date: 12/22/2020  Admitted From: home Disposition:  home  Recommendations for Outpatient Follow-up:  Follow up with PCP in 1-2 weeks Follow-up with Dr. Kipp Brood in 3 days at the scheduled Please obtain BMP/CBC in one week Please follow up on the following pending results:  Home Health:no  Equipment/Devices: none  Discharge Condition: Stable Code Status:   Code Status: Full Code Diet recommendation:  Diet Order             Diet Carb Modified Fluid consistency: Thin; Room service appropriate? Yes  Diet effective now           Diet - low sodium heart healthy                    Brief/Interim Summary:   [name], 25 y.o. male with PMH of T1DM polysubstance abuse seen in the ED 12/05/20 at [facility] with tachycardia, tachypnea, chest pain, shortness of breath lightheadedness of several days, and in the ED found to have acute hypoxic respiratory failure SPO2 80% on room air, chest x-ray showing right pneumothorax large with tension physiology chest tube placed in the ED, and was admitted and chest tube planned to be managed by Dr. Arnoldo Morale at [facility].  But patient had worsening respiratory status repeat chest x-ray done by CTA chest showed extensive diffuse right lung airspace disease mild present edema, infection or hemorrhoids, discussed with PCCM started IV Lasix, chest tube to continuous suction, consulted CT surgery Dr. Mohammed Kindle and transferred to [facility]. Had [facility] patient underwent robotic assisted thorascopic, wedge resection and apical pleurectomy, mechanical pleurodesis and intercostal nerve block on 12/10/20. On 10/12 and 10/13 patient had worsening and enlarging pneumothorax. Patient is managed with suction.  Overall clinically improving, at this time off oxygen and on room air.his chest tube was converted to mini express  collection system 10/18 and cardiothoracic surgery has cleared him for discharge home.  Patient is threatening to leave [place] if he is not discharged today.  He reports he has been ambulating well.  He reports he can take care of his DM AT [place] has been fluctuating here and we are adjusting insulin.   Discharge Diagnoses:  Primary spontaneous pneumothorax: Initial chest tube insertion in ED 10/1> transferred to [facility] seen by CT VS and with worsening and continuous air leak s/p robotic assisted thorascopic, wedge resection and apical pleurectomy, mechanical pleurodesis and intercostal nerve block on 12/10/20.  his chest tube was converted to mini express collection system 10/18 and cardiothoracic surgery has cleared him for discharge home.   DM type 1 with uncontrolled hyperglycemia.  Managed with 70/30 insulin at home: Blood sugar fluctuating,hbA1c poorly controlled 10.5 indicating poor op control.  Blood sugar level fluctuating here.  Patient has insulin at home and he will resume his home dose with QA CHS monitoring.  Ideally I would like to monitor him overnight but he is threatening to leave [place] and reports that he knows very well how to take care of his diabetes DM: Has seen him today placed on sensors monitor  AKI resolved.  Monitor renal function.   Polysubstance use disorder UDS positive for opiates benzos amphetamine THC.  Denied current drug use during admission.  Continue home risperidone Consults: TCTS  Subjective: Alert awake oriented resting comfortably did well after converting him to  mini express back.  Has been ambulating well and eating well.  Discharge Exam: Vitals:   12/22/20 1035 12/22/20 1055  BP:  116/79  Pulse: 82 100  Resp:  18  Temp:  97.7 F (36.5 C)  SpO2:  99%   General: Pt is alert, awake, not in acute distress Cardiovascular: RRR, S1/S2 +, no rubs, no gallops Respiratory: CTA bilaterally, no  wheezing, no rhonchi Abdominal: Soft, NT, ND, bowel sounds + Extremities: no edema, no cyanosis  Discharge Instructions  Discharge Instructions     Call MD for:  severe uncontrolled pain   Complete by: As directed    Call MD for:  temperature >100.4   Complete by: As directed    Diet - low sodium heart healthy   Complete by: As directed    Discharge instructions   Complete by: As directed    Follow-up with Dr. Kipp Brood as a scheduled on coming Friday.  Please call call MD or return to ER for similar or worsening recurring problem that brought you to hospital or if any fever,nausea/vomiting,abdominal pain, uncontrolled pain, chest pain,  shortness of breath or any other alarming symptoms.  Please follow-up your doctor as instructed in a week time and call the office for appointment.  Please avoid alcohol, smoking, or any other illicit substance and maintain healthy habits including taking your regular medications as prescribed.  You were cared for by a hospitalist during your hospital stay. If you have any questions about your discharge medications or the care you received while you were in the hospital after you are discharged, you can call the unit and ask to speak with the hospitalist on call if the hospitalist that took care of you is not available.  Once you are discharged, your primary care physician will handle any further medical issues. Please note that NO REFILLS for any discharge medications will be authorized once you are discharged, as it is imperative that you return to your primary care physician (or establish a relationship with a primary care physician if you do not have one) for your aftercare needs so that they can reassess your need for medications and monitor your lab values   Check blood sugar 3 times a day and bedtime at home. If blood sugar running above 200 less than 70 please call your MD to adjust insulin. If blood sugars running less 100 do not use insulin  and call MD. If you noticed signs and symptoms of hypoglycemia or low blood sugar like jitteriness, confusion, thirst, tremor, sweating- Check blood sugar, drink sugary drink/biscuits/sweets to increase sugar level and call MD or return to ER.   Increase activity slowly   Complete by: As directed    Increase activity slowly   Complete by: As directed    No wound care   Complete by: As directed    No wound care   Complete by: As directed       Allergies as of 12/22/2020       Reactions   Sulfa Antibiotics Hives, Other (See Comments)   Reaction:  Fever        Medication List     STOP taking these medications    amoxicillin-clavulanate 875-125 MG tablet Commonly known as: AUGMENTIN   clindamycin 300 MG capsule Commonly known as: CLEOCIN   insulin glargine 100 UNIT/ML Solostar Pen Commonly known as: Lantus SoloStar   lisinopril 10 MG tablet Commonly known as: ZESTRIL   ondansetron 4 MG disintegrating tablet Commonly known as: Zofran  ODT       TAKE these medications    gabapentin 300 MG capsule Commonly known as: NEURONTIN Take 300 mg by mouth 3 (three) times daily.   ibuprofen 200 MG tablet Commonly known as: Motrin IB Take 2 tablets (400 mg total) by mouth every 6 (six) hours as needed for moderate pain.   insulin NPH-regular Human (70-30) 100 UNIT/ML injection Inject 40 Units into the skin 2 (two) times daily.   risperiDONE 3 MG tablet Commonly known as: RISPERDAL Take 3 mg by mouth daily.   Symbicort 160-4.5 MCG/ACT inhaler Generic drug: budesonide-formoterol Inhale 2 puffs into the lungs 2 (two) times daily.        Follow-up Information     Neale Burly, MD Follow up.   Specialty: Internal Medicine Contact information: [place] [address] [phone]         Lajuana Matte, MD. Go on 12/25/2020.   Specialty: Cardiothoracic Surgery Why: Appointment time is at 11:50 am Please arrive at 11:20 for a chest  x-ray to be performed by [facility] located on the first floor of the same building. Contact information: [address] [place] [address] [phone]                Allergies  Allergen Reactions   Sulfa Antibiotics Hives and Other (See Comments)    Reaction:  Fever     The results of significant diagnostics from this hospitalization (including imaging, microbiology, ancillary and laboratory) are listed below for reference.    Microbiology: No results found for this or any previous visit (from the past 240 hour(s)).  Procedures/Studies: DG Chest 1 View  Result Date: 12/19/2020 CLINICAL DATA:  Chest tube placement, pneumothorax. EXAM: CHEST  1 VIEW COMPARISON:  Chest x-ray dated 12/18/2020. FINDINGS: RIGHT-sided chest tube is stable in position. Stable appearance of the RIGHT-sided pneumothorax, small to moderate in size. Probable mild atelectasis at the RIGHT lung base. LEFT lung is clear. Heart size is normal. IMPRESSION: Stable appearance of the RIGHT-sided pneumothorax, small to moderate in size. RIGHT-sided chest tube is stable in position. Probable mild atelectasis at the RIGHT lung base. Electronically Signed   By: Franki Cabot M.D.   On: 12/19/2020 10:14   DG Chest 1 View  Result Date: 12/18/2020 CLINICAL DATA:  Follow-up chest tube EXAM: CHEST  1 VIEW COMPARISON:  Yesterday FINDINGS: Decreased right pneumothorax which is small in seen at the apex. Interval postoperative right upper lobe which likely accounts for the new extrapleural thickening. Atelectasis at the right base. Normal heart size. IMPRESSION: Small right apical pneumothorax.  Mild atelectasis. Electronically Signed   By: Jorje Guild M.D.   On: 12/18/2020 07:53   DG Chest 2 View  Result Date: 12/20/2020 CLINICAL DATA:  Follow-up exam, chest pain. EXAM: CHEST - 2 VIEW COMPARISON:  Chest x-rays dated 12/19/2020 and 12/18/2020. FINDINGS: Heart size and mediastinal contours are  stable. Suture line along the upper RIGHT mediastinal margin. RIGHT-sided chest tube is stable in position. Stable appearance of the pneumothorax and pleural fluid/thickening at the RIGHT lung apex, adjacent to the chest tube and suture line. LEFT lung remains clear. IMPRESSION: Stable chest x-ray. Stable small pneumothorax and pleural fluid/thickening at the RIGHT lung apex. Adjacent RIGHT-sided chest tube is stable in position with tip at the medial aspect of the RIGHT lung apex. Electronically Signed   By: Franki Cabot M.D.   On: 12/20/2020 06:58   DG Chest 2  View  Result Date: 12/17/2020 CLINICAL DATA:  Follow-up pneumothorax EXAM: CHEST - 2 VIEW COMPARISON:  12/16/2020 FINDINGS: Right-sided chest tube is unchanged in positioning. Large right-sided pneumothorax, significantly increased in size from the previous study. There is layering right-sided pleural effusion. No shift of the heart or mediastinal structures. Left lung remains clear. No left-sided pneumothorax. IMPRESSION: Large right-sided hydropneumothorax, significantly increased in size from the previous study. No mediastinal shift to suggest tension component. Right-sided chest tube remains in place. These results will be called to the ordering clinician or representative by the Radiologist Assistant, and communication documented in the PACS or [employer]. Electronically Signed   By: Davina Poke D.O.   On: 12/17/2020 08:59   CT CHEST WO CONTRAST  Result Date: 12/09/2020 CLINICAL DATA:  Pneumothorax. Assess for resolution of edema prior to planned surgery tomorrow. EXAM: CT CHEST WITHOUT CONTRAST TECHNIQUE: Multidetector CT imaging of the chest was performed following the standard protocol without IV contrast. COMPARISON:  12/05/2020 FINDINGS: Cardiovascular: Normal in size and configuration. No pericardial effusion. Great vessels are unremarkable. Mediastinum/Nodes: No enlarged mediastinal or axillary lymph nodes. Thyroid gland,  trachea, and esophagus demonstrate no significant findings. Lungs/Pleura: Moderate, proximally 50%, right-sided pneumothorax, air collecting anteriorly. Middle and superior mediastinal structures are midline. Heart is mildly deviated to the left. Pneumothorax not identified on the current portable chest radiograph. Right chest tube has its tip in the posterior right upper hemithorax. Small areas of ground-glass opacity noted in the right lung, with more confluent type opacity noted at the base of the right middle lobe and in the right lower lobe. This represents significant improvement of the more diffuse ground-glass opacities noted on the CT from 12/05/2020. Mild atelectasis at the posterior base of the left lower lobe. Left lung otherwise clear. There is bilateral paraseptal fizzy mobile with more extensive blebs at the right apex. No pleural effusion.  No left pneumothorax. Upper Abdomen: Unremarkable. Musculoskeletal: Unremarkable. IMPRESSION: 1. Moderate, approximally 50%, right pneumothorax. This lies predominantly anteriorly, and was not visualized on the current portable chest radiograph. The heart is mildly deviated to the left. 2. Well position right chest tube, tip in the posterior right upper hemithorax. 3. Patchy areas of ground-glass opacity are noted in the right mid to lower lung with more confluent opacity closer to the lung base. The more confluent opacity is likely atelectasis. This represents significant improvement in the more extensive ground-glass opacities noted throughout the right lung on the prior CT. 4. Significant apical blebs on the right with milder paraseptal emphysema at the left apex. Emphysema (ICD10-J43.9). Electronically Signed   By: Lajean Manes M.D.   On: 12/09/2020 13:33   CT Angio Chest Pulmonary Embolism (PE) W or WO Contrast  Result Date: 12/05/2020 CLINICAL DATA:  Spontaneous pneumothorax. EXAM: CT ANGIOGRAPHY CHEST WITH CONTRAST TECHNIQUE: Multidetector CT imaging  of the chest was performed using the standard protocol during bolus administration of intravenous contrast. Multiplanar CT image reconstructions and MIPs were obtained to evaluate the vascular anatomy. CONTRAST:  Intravenous contrast was administered COMPARISON:  Chest x-ray 12/05/2020. FINDINGS: Cardiovascular: There is preferential opacification of the thoracic aorta. The thoracic aorta appears within normal limits. The pulmonary arteries are not well opacified. Pulmonary emboli cannot be excluded on this exam. The heart is normal in size. There is no pericardial effusion. Mediastinum/Nodes: There are prominent subcarinal and right hilar lymph nodes. No definite enlarged lymph nodes are seen. Esophagus is nondilated. Visualized thyroid gland is within normal limits. Lungs/Pleura: Right-sided chest tube  is present with distal tip in the anteromedial pleural space. There is a 15% right pneumothorax. There is no mediastinal shift. Emphysematous changes are seen in both lung apices, right greater than left. There is a small simple right pleural effusion is well. There are diffuse airspace opacities throughout the right lung. Left lung is clear. Trachea and central airways are patent. Upper Abdomen: No acute abnormality. Musculoskeletal: There is mild right chest wall emphysema secondary to chest tube. No acute fractures are seen. Review of the MIP images confirms the above findings. IMPRESSION: 1. Poor opacification of pulmonary arteries secondary to timing of contrast bolus. CT exclude pulmonary embolism on the study. 2. Small right hydropneumothorax with right chest tube in place. 3. Extensive diffuse right lung airspace disease may represent edema, infection and/or hemorrhage. Left lung is clear. Electronically Signed   By: Ronney Asters M.D.   On: 12/05/2020 22:22   DG Chest 1V REPEAT Same Day  Result Date: 12/05/2020 CLINICAL DATA:  Chest tube placement. EXAM: CHEST - 1 VIEW SAME DAY COMPARISON:  Chest  radiograph earlier same day. FINDINGS: Interval placement of right chest tube with re-expansion of the right lung. There is still a persistent moderate right pneumothorax. Patchy opacities predominately within the mid right lung. Left lung is clear. Normal cardiac and mediastinal contours. IMPRESSION: Interval insertion of right-sided chest tube with re-expansion of the right lung. There is still a persistent moderate right pneumothorax. Patchy opacities throughout the rear-ended right lung may represent re-expansion edema or hemorrhage. Underlying mass or infection not excluded. Short-term follow-up chest radiograph is recommended. Electronically Signed   By: Lovey Newcomer M.D.   On: 12/05/2020 17:16   DG Chest Port 1 View  Result Date: 12/22/2020 CLINICAL DATA:  Chest tube in place.  Pt denies SOB and chest pain. EXAM: PORTABLE CHEST - 1 VIEW COMPARISON:  the previous day's study FINDINGS: Right multi sidehole chest tube directed towards the lung apex. No definite pneumothorax is evident. Changes of right upper lung surgical resection. Left lung clear. Heart size and mediastinal contours are within normal limits. No effusion. Visualized bones unremarkable. Stable right lateral subcutaneous emphysema. IMPRESSION: 1. Stable right chest tube with no definite pneumothorax identified. Electronically Signed   By: Lucrezia Europe M.D.   On: 12/22/2020 08:16   DG CHEST PORT 1 VIEW  Result Date: 12/21/2020 CLINICAL DATA:  Surgery follow-up. EXAM: PORTABLE CHEST 1 VIEW COMPARISON:  12/20/2020 FINDINGS: The cardiomediastinal silhouette is unchanged. Postoperative changes are again seen in the right lung with associated volume loss, a small right apical pneumothorax, and small right apical pleural fluid or thickening. A right chest tube is unchanged. The left lung remains clear. IMPRESSION: Unchanged appearance of the chest including a small right apical pneumothorax. Electronically Signed   By: Logan Bores M.D.   On:  12/21/2020 08:59   DG CHEST PORT 1 VIEW  Result Date: 12/16/2020 CLINICAL DATA:  Chest tube, lung surgery EXAM: PORTABLE CHEST 1 VIEW COMPARISON:  Radiograph 12/15/2020, chest CT 12/09/2020 FINDINGS: Unchanged cardiomediastinal silhouette. Postsurgical changes in the right upper lobe. There is a small right-sided pneumothorax, increased in size in comparison to yesterday's exam, with unchanged position of right apical chest tube. No new airspace disease. No large pleural effusion. No acute osseous abnormality. Unchanged subcutaneous emphysema along the right chest wall. IMPRESSION: Small right-sided pneumothorax, increased in size in comparison to yesterday's exam. Unchanged position of right apical chest tube. Electronically Signed   By: Maurine Simmering M.D.   On:  12/16/2020 09:33   DG Chest Port 1 View  Result Date: 12/15/2020 CLINICAL DATA:  Status post chest tube placement. EXAM: PORTABLE CHEST 1 VIEW COMPARISON:  December 15, 2020 FINDINGS: Interval right-sided chest tube placement is noted with its distal tip seen overlying the medial aspect of the upper right lung. Ill-defined surgical sutures are also seen within this region. Expansion of the right lung is seen with a 3 mm residual lateral right pneumothorax noted. Stable right apical pleural thickening is present. The heart size and mediastinal contours are within normal limits. Moderate to marked severity subcutaneous emphysema is seen along the lateral aspect of the right chest wall. The visualized skeletal structures are unremarkable. IMPRESSION: Interval right-sided chest tube placement with a 3 mm residual lateral right pneumothorax. Electronically Signed   By: Virgina Norfolk M.D.   On: 12/15/2020 15:30   DG Chest Port 1 View  Result Date: 12/15/2020 CLINICAL DATA:  Follow-up pneumothorax. EXAM: PORTABLE CHEST 1 VIEW COMPARISON:  Chest CT 12/09/2020 and chest x-ray 12/14/2020 FINDINGS: Interval development of a large recurrent right-sided  pneumothorax estimated at 80%. No shift of the heart or mediastinum. The left lung remains clear. Progressive subcutaneous emphysema on the right side. IMPRESSION: Large recurrent right-sided pneumothorax estimated at 80%. These results will be called to the ordering clinician or representative by the Radiologist Assistant, and communication documented in the PACS or [employer]. Electronically Signed   By: Marijo Sanes M.D.   On: 12/15/2020 13:04   DG Chest Port 1 View  Result Date: 12/14/2020 CLINICAL DATA:  Pneumothorax EXAM: PORTABLE CHEST 1 VIEW COMPARISON:  12/14/2020 at 6:29 a.m. FINDINGS: Single frontal view of the chest demonstrates interval removal of the right-sided chest tube. There is a trace recurrent right pneumothorax, with pleural separation in the mid right chest measuring approximately 4 mm. Stable right apical pleural fluid and postsurgical changes within the right upper lobe. Left chest is clear. Cardiac silhouette is unremarkable. No acute bony abnormalities. Persistent subcutaneous gas right lateral chest wall. IMPRESSION: 1. Trace recurrent right sided pneumothorax after right chest tube removal. Volume estimated far less than 5%. 2. Persistent postsurgical changes at the right apex, with minimal right apical pleural fluid. These results will be called to the ordering clinician or representative by the Radiologist Assistant, and communication documented in the PACS or [employer]. Electronically Signed   By: Randa Ngo M.D.   On: 12/14/2020 14:59   DG CHEST PORT 1 VIEW  Result Date: 12/14/2020 CLINICAL DATA:  Follow-up pneumothorax post chest tube placement. EXAM: PORTABLE CHEST 1 VIEW COMPARISON:  Radiographs 12/13/2020 and 12/12/2020. FINDINGS: 0629 hours. Right chest tube remains in place. No residual pneumothorax identified. There is a small amount of pleural fluid adjacent to the right lung apex. Postsurgical changes are present the right lung apex, and there is  residual mild soft tissue emphysema inferolaterally in the right chest wall. The lungs are otherwise clear. The heart size and mediastinal contours are stable. IMPRESSION: 1. No evidence of residual right-sided pneumothorax. Soft tissue emphysema remains in the right chest wall near the chest tube insertion. 2. Small amount of right apical pleural fluid with adjacent postsurgical changes. Electronically Signed   By: Richardean Sale M.D.   On: 12/14/2020 09:23   DG Chest Port 1 View  Result Date: 12/13/2020 CLINICAL DATA:  Shortness of breath and chest pain. EXAM: PORTABLE CHEST 1 VIEW COMPARISON:  December 12, 2020 FINDINGS: Right apical chest tube is in place. The small right hydropneumothorax  in the apex is unchanged. The left lung is clear. The mediastinal contour and cardiac silhouette are normal. Osseous structures normal. IMPRESSION: Right apical chest tube is in place. The small right hydropneumothorax in the apex is unchanged. Electronically Signed   By: Abelardo Diesel M.D.   On: 12/13/2020 09:10   DG CHEST PORT 1 VIEW  Result Date: 12/12/2020 CLINICAL DATA:  Pneumothorax EXAM: PORTABLE CHEST 1 VIEW COMPARISON:  Chest radiograph from one day prior. FINDINGS: Right apical chest tube in place. Stable cardiomediastinal silhouette with normal heart size. Small right apical pneumothorax, slightly decreased, less than 5%. No left pneumothorax. No pleural effusion. Surgical sutures overlying the medial upper right lung with associated surrounding mild hazy opacity, unchanged. Otherwise clear lungs with no pulmonary edema. IMPRESSION: Small right apical pneumothorax, slightly decreased. Right apical chest tube in place. Stable hazy opacity in the medial upper right lung surrounding a suture line compatible with postsurgical change. Electronically Signed   By: Ilona Sorrel M.D.   On: 12/12/2020 10:26   DG Chest Port 1 View  Result Date: 12/11/2020 CLINICAL DATA:  Chest tube, status post lobectomy EXAM:  PORTABLE CHEST 1 VIEW COMPARISON:  Radiograph 12/10/2020, chest CT 12/09/2020 FINDINGS: Postsurgical changes of right upper lobectomy. There is a persistent small right apical pneumothorax, though decreased in size in comparison to the radiograph yesterday. Unchanged position of right apical chest tube. There is no new focal airspace disease. The left lung is clear. There is no large pleural effusion. No acute osseous abnormality. Unchanged subcutaneous emphysema along the right lower chest wall. IMPRESSION: Persistent small right apical pneumothorax, decreased in size in comparison to yesterday's radiograph. Unchanged position of right apical chest tube. Electronically Signed   By: Maurine Simmering M.D.   On: 12/11/2020 09:26   DG Chest Port 1 View  Result Date: 12/10/2020 CLINICAL DATA:  Status post lobectomy on the right EXAM: PORTABLE CHEST 1 VIEW COMPARISON:  12/09/2020 CT FINDINGS: Previously seen chest tube on the right is been removed and a new chest tube placed with the tip noted in the apex. There remains an apical pneumothorax on the right although significantly improved from prior CT examination. No sizable effusion is noted. Left lung is clear. Cardiac shadow and bony structures are within normal limits. IMPRESSION: Right apical pneumothorax with new chest tube in place. The pneumothorax is smaller than that seen on the prior CT exam. Electronically Signed   By: Inez Catalina M.D.   On: 12/10/2020 19:56   DG CHEST PORT 1 VIEW  Result Date: 12/09/2020 CLINICAL DATA:  Follow-up for right pneumothorax and right chest tube. EXAM: PORTABLE CHEST 1 VIEW COMPARISON:  12/08/2020 and older studies. FINDINGS: No visualized pneumothorax. Right chest tube is stable. Patchy opacity at the right lung base is also without significant change compared to the previous day's study. Remainder of the right lung is clear. Clear left lung. Right lateral chest wall subcutaneous sent emphysema is stable. IMPRESSION: 1. No  significant change from the most recent prior study. 2. No evidence of a residual pneumothorax.  Stable right chest tube. 3. Persistent right lung base opacity. Electronically Signed   By: Lajean Manes M.D.   On: 12/09/2020 13:17   DG CHEST PORT 1 VIEW  Result Date: 12/08/2020 CLINICAL DATA:  Follow-up pneumothorax. EXAM: PORTABLE CHEST 1 VIEW COMPARISON:  12/07/2020 at 4 p.m. FINDINGS: No residual right pneumothorax. Right chest tube is stable. There is opacity at the right lung base, which appears increased from the  most recent prior exam. Remainder of the lungs is clear. No convincing pleural effusion.  No left pneumothorax. Right lateral chest wall subcutaneous emphysema is stable. IMPRESSION: 1. Stable right-sided chest tube with no evidence of a residual pneumothorax. 2. Airspace opacity at the right lung base fat may reflect edema or atelectasis, not evident on the most recent prior chest radiograph. Overall, right lung opacities have significantly improved compared to the chest CT dated 12/05/2020. Electronically Signed   By: Lajean Manes M.D.   On: 12/08/2020 10:18   DG CHEST PORT 1 VIEW  Result Date: 12/07/2020 CLINICAL DATA:  Follow-up RIGHT-sided pneumothorax EXAM: PORTABLE CHEST 1 VIEW COMPARISON:  12/07/2020 at 127 hours FINDINGS: Interval placement large-bore chest tube with tip at the RIGHT lung apex. Interval re-expansion of the RIGHT lung with no appreciable pneumothorax remaining. Small amount of subcutaneous gas along the RIGHT chest wall unchanged. IMPRESSION: Re-expansion of RIGHT lung following chest tube placement. No pneumothorax identified. Electronically Signed   By: Suzy Bouchard M.D.   On: 12/07/2020 16:38   DG CHEST PORT 1 VIEW  Result Date: 12/07/2020 CLINICAL DATA:  Follow-up right-sided pneumothorax. Right-sided chest tube. EXAM: PORTABLE CHEST 1 VIEW COMPARISON:  12/07/2020 at 6:17 a.m. FINDINGS: There has been a mild further increase in the size of the right-sided  pneumothorax, again without midline shift to suggest tension. No change in the position of the right inferior hemithorax chest tube. No change in the airspace opacity throughout most of the partly collapsed right lung. Left lung remains clear. IMPRESSION: 1. Mild additional increase in the size of the right-sided pneumothorax since the exam obtained this morning. No other change. Electronically Signed   By: Lajean Manes M.D.   On: 12/07/2020 13:55   DG CHEST PORT 1 VIEW  Result Date: 12/07/2020 CLINICAL DATA:  Sob, PTX,chest tube present EXAM: PORTABLE CHEST 1 VIEW COMPARISON:  12/06/2020 and older exams FINDINGS: Right-sided pneumothorax is increased in size, estimated at 30%. There is no shift the mediastinum. Right lung airspace opacity is without change allowing for the additional right lung collapse since the previous day's exam. Left lung remains clear.  No left pneumothorax. Right chest tube projects at the medial base of the right hemithorax. Right lateral chest wall subcutaneous emphysema has increased. IMPRESSION: 1. Increased size of the right pneumothorax, now estimated at 30%. No midline shift to suggest tension. No change in the position of the right-sided chest tube. 2. Mild increase in right lateral chest wall subcutaneous emphysema. 3. No other change.  Persistent consolidation in the right lung. Electronically Signed   By: Lajean Manes M.D.   On: 12/07/2020 08:22   DG CHEST PORT 1 VIEW  Result Date: 12/06/2020 CLINICAL DATA:  Follow-up pneumothorax. EXAM: PORTABLE CHEST 1 VIEW COMPARISON:  December 05, 2020 FINDINGS: A right apical pneumothorax remains measuring up to 3.1 cm, not significantly changed. Opacity diffusely throughout the aerated right lung is similar in the interval. A right chest tube remains, pulled back a little in the interval. The left lung is clear. The cardiomediastinal silhouette is unchanged. IMPRESSION: 1. The right chest tube is been pulled back several cm but  remains in adequate position. The right apical pneumothorax is stable. 2. Stable diffuse opacity throughout the aerated right lung. 3. No other abnormalities. Electronically Signed   By: Dorise Bullion III M.D.   On: 12/06/2020 10:01   DG CHEST PORT 1 VIEW  Result Date: 12/05/2020 CLINICAL DATA:  Worsening tachypnea, status post chest tube insertion.  EXAM: PORTABLE CHEST 1 VIEW COMPARISON:  December 05, 2020 (4:44 p.m.) FINDINGS: A right-sided chest tube is seen with its distal end overlying the mediastinum at the level of the carina. Marked severity airspace disease is seen within the mid right lung and right lung base. This is increased in severity when compared to the prior study. There is no evidence of a pleural effusion. A stable right apical pneumothorax is seen. The heart size and mediastinal contours are within normal limits. The visualized skeletal structures are unremarkable. IMPRESSION: 1. Marked severity airspace disease within the mid right lung and right lung base. 2. Right-sided chest tube positioning, as described above, with a stable right apical pneumothorax. Electronically Signed   By: Virgina Norfolk M.D.   On: 12/05/2020 20:47   DG Chest Port 1 View  Result Date: 12/05/2020 CLINICAL DATA:  Shortness of breath. EXAM: PORTABLE CHEST 1 VIEW COMPARISON:  None. FINDINGS: Large right pneumothorax with complete collapse of the right lung and leftward shift of the cardiomediastinal structures. Left lung is clear. No evidence of pleural effusion. No acute osseous abnormality. IMPRESSION: Large right pneumothorax with evidence of tension physiology. Critical Value/emergent results were called by telephone at the time of interpretation on 12/05/2020 at 4:36 pm to provider [facility] , who verbally acknowledged these results. Electronically Signed   By: Yetta Glassman M.D.   On: 12/05/2020 16:37    Labs: BNP (last 3 results) No results for input(s): BNP in the last 8760 hours. Basic  Metabolic Panel: Recent Labs  Lab 12/19/20 0617 12/20/20 0147  NA 137 135  K 4.3 4.2  CL 102 100  CO2 27 28  GLUCOSE 169* 261*  BUN 30* 35*  CREATININE 1.13 1.10  CALCIUM 9.0 9.2  MG 1.9 1.9  PHOS 3.5 3.4   Liver Function Tests: No results for input(s): AST, ALT, ALKPHOS, BILITOT, PROT, ALBUMIN in the last 168 hours. No results for input(s): LIPASE, AMYLASE in the last 168 hours. No results for input(s): AMMONIA in the last 168 hours. CBC: Recent Labs  Lab 12/19/20 0617 12/20/20 0147  WBC 9.9 10.5  HGB 12.3* 12.2*  HCT 37.9* 37.9*  MCV 92.0 94.0  PLT 454* 465*   Cardiac Enzymes: No results for input(s): CKTOTAL, CKMB, CKMBINDEX, TROPONINI in the last 168 hours. BNP: Invalid input(s): POCBNP CBG: Recent Labs  Lab 12/22/20 0606 12/22/20 0648 12/22/20 0747 12/22/20 0810 12/22/20 1203  GLUCAP 61* 67* 55* 76 365*   D-Dimer No results for input(s): DDIMER in the last 72 hours. Hgb A1c No results for input(s): HGBA1C in the last 72 hours. Lipid Profile No results for input(s): CHOL, HDL, LDLCALC, TRIG, CHOLHDL, LDLDIRECT in the last 72 hours. Thyroid function studies No results for input(s): TSH, T4TOTAL, T3FREE, THYROIDAB in the last 72 hours.  Invalid input(s): FREET3 Anemia work up No results for input(s): VITAMINB12, FOLATE, FERRITIN, TIBC, IRON, RETICCTPCT in the last 72 hours. Urinalysis    Component Value Date/Time   COLORURINE STRAW (A) 11/21/2017 1504   APPEARANCEUR CLEAR 11/21/2017 1504   LABSPEC 1.027 11/21/2017 1504   PHURINE 6.0 11/21/2017 1504   GLUCOSEU >=500 (A) 11/21/2017 1504   HGBUR NEGATIVE 11/21/2017 1504   BILIRUBINUR NEGATIVE 11/21/2017 1504   BILIRUBINUR neg 04/16/2014 1752   KETONESUR 20 (A) 11/21/2017 1504   PROTEINUR NEGATIVE 11/21/2017 1504   UROBILINOGEN 0.2 10/17/2014 0743   NITRITE NEGATIVE 11/21/2017 1504   LEUKOCYTESUR NEGATIVE 11/21/2017 1504   Sepsis Labs Invalid input(s): PROCALCITONIN,  WBC,  [facility] No results found for this or any previous visit (from the past 240 hour(s)).   Time coordinating discharge: 25 minutes  SIGNED: Antonieta Pert, MD  [facility] 12/22/2020, 3:10 PM  If 7PM-7AM, please contact night-coverage [email or web address]

## 2020-12-22 NOTE — Progress Notes (Signed)
      [address]       [place] [address]             [phone]        CLINICAL DATA:  Chest tube in place.  Pt denies SOB and chest pain.   EXAM: PORTABLE CHEST - 1 VIEW   COMPARISON:  the previous day's study   FINDINGS:  Right multi sidehole chest tube directed towards the lung apex. No definite pneumothorax is evident. Changes of right upper lung surgical resection. Left lung clear.   Heart size and mediastinal contours are within normal limits.   No effusion.   Visualized bones unremarkable. Stable right lateral subcutaneous emphysema.   IMPRESSION: 1. Stable right chest tube with no definite pneumothorax identified.     Electronically Signed   By: Lucrezia Europe M.D.   On: 12/22/2020 08:16

## 2020-12-25 ENCOUNTER — Ambulatory Visit
Admission: RE | Admit: 2020-12-25 | Discharge: 2020-12-25 | Disposition: A | Discharge status: Home / Self Care | Payer: BC Managed Care – PPO | Source: Ambulatory Visit | Attending: Thoracic Surgery (Cardiothoracic Vascular Surgery) | Admitting: Thoracic Surgery (Cardiothoracic Vascular Surgery)

## 2020-12-25 ENCOUNTER — Ambulatory Visit (INDEPENDENT_AMBULATORY_CARE_PROVIDER_SITE_OTHER): Payer: Self-pay | Admitting: Thoracic Surgery (Cardiothoracic Vascular Surgery)

## 2020-12-25 ENCOUNTER — Other Ambulatory Visit: Payer: Self-pay | Admitting: Thoracic Surgery (Cardiothoracic Vascular Surgery)

## 2020-12-25 ENCOUNTER — Other Ambulatory Visit: Payer: Self-pay

## 2020-12-25 VITALS — BP 121/84 | HR 96 | Resp 20 | Ht 71.0 in | Wt 142.0 lb

## 2020-12-25 DIAGNOSIS — J9383 Other pneumothorax: Secondary | ICD-10-CM

## 2020-12-25 DIAGNOSIS — J982 Interstitial emphysema: Secondary | ICD-10-CM | POA: Diagnosis not present

## 2020-12-25 DIAGNOSIS — Z9689 Presence of other specified functional implants: Secondary | ICD-10-CM

## 2020-12-25 DIAGNOSIS — Z09 Encounter for follow-up examination after completed treatment for conditions other than malignant neoplasm: Secondary | ICD-10-CM

## 2020-12-25 DIAGNOSIS — J439 Emphysema, unspecified: Secondary | ICD-10-CM

## 2020-12-25 DIAGNOSIS — Z4682 Encounter for fitting and adjustment of non-vascular catheter: Secondary | ICD-10-CM | POA: Diagnosis not present

## 2020-12-25 NOTE — Progress Notes (Signed)
      [address]       [place] [address]             [phone]        Jayven A Herzig [place] Medical Record #[id number] Date of Birth: [birth year]  Referring: Richarda Osmond, MD Primary Care: Neale Burly, MD Primary Cardiologist:None  Reason for visit:   follow-up  History of Present Illness:     Mr. Southgate comes in for his 1 week follow-up appointment.  He underwent a robotic assisted wedge resection apical pleurectomy mechanical pleurodesis.  He was originally scheduled for discharge from the day of discharge he dropped his lung again and required a pigtail catheter placement.  His chest x-ray eventually stabilized and he was discharged home on this.  Physical Exam: BP 121/84   Pulse 96   Resp 20   Ht 5\' 11"  (1.803 m)   Wt 142 lb (64.4 kg)   SpO2 99% Comment: RA  BMI 19.80 kg/m   Alert NAD Incision clean.  Chest tube in place.  Stitch was removed.  The pigtail catheter was also removed. Abdomen soft, ND No peripheral edema   Diagnostic Studies & Laboratory data: CXR: Stable chest x-ray after clamping the chest tube.     Assessment / Plan:   25 year old male status post wedge resection mechanical pleurodesis and apical pleurectomy, and replacement of his pigtail catheter.  Chest tube was removed after stable clamping trial.  The patient will come back in 82month with a repeat chest x-ray.  If this x-ray is clear then he can follow-up as needed.   Lajuana Matte 12/25/2020 5:38 PM

## 2021-01-20 DIAGNOSIS — Z0000 Encounter for general adult medical examination without abnormal findings: Secondary | ICD-10-CM | POA: Diagnosis not present

## 2021-01-20 DIAGNOSIS — Z6821 Body mass index (BMI) 21.0-21.9, adult: Secondary | ICD-10-CM | POA: Diagnosis not present

## 2021-01-25 ENCOUNTER — Other Ambulatory Visit: Payer: Self-pay | Admitting: Thoracic Surgery (Cardiothoracic Vascular Surgery)

## 2021-01-25 DIAGNOSIS — J9311 Primary spontaneous pneumothorax: Secondary | ICD-10-CM

## 2021-01-26 ENCOUNTER — Ambulatory Visit (INDEPENDENT_AMBULATORY_CARE_PROVIDER_SITE_OTHER): Payer: Self-pay | Admitting: Surgical

## 2021-01-26 ENCOUNTER — Other Ambulatory Visit: Payer: Self-pay

## 2021-01-26 ENCOUNTER — Ambulatory Visit
Admission: RE | Admit: 2021-01-26 | Discharge: 2021-01-26 | Disposition: A | Discharge status: Home / Self Care | Payer: BC Managed Care – PPO | Source: Ambulatory Visit | Attending: Thoracic Surgery (Cardiothoracic Vascular Surgery) | Admitting: Thoracic Surgery (Cardiothoracic Vascular Surgery)

## 2021-01-26 VITALS — BP 116/72 | HR 100 | Resp 20 | Ht 71.0 in | Wt 146.0 lb

## 2021-01-26 DIAGNOSIS — J939 Pneumothorax, unspecified: Secondary | ICD-10-CM | POA: Diagnosis not present

## 2021-01-26 DIAGNOSIS — J9311 Primary spontaneous pneumothorax: Secondary | ICD-10-CM

## 2021-01-26 NOTE — Progress Notes (Signed)
[address]       [place] [address]             [phone]      Farrell A [place]el [place] Medical Record #[id number] Date of Birth: [birth year]  Referring: Richarda Osmond, MD Primary Care: Neale Burly, MD Primary Cardiologist: None   Chief Complaint:   POST OP FOLLOW UP 12/10/2020   Patient:  Pedro [name] Pre-Op Dx: spontaneous pneumothorax Bullous emphysema   Post-op Dx:  same Procedure:   - Robotic assisted right video thoracoscopy - Wedge resection of the right upper lobe - Apical pleurectomy - Mechanical pleurodesis - Intercostal nerve block   Surgeon and Role:      * Lightfoot, Lucile Crater, MD - Primary    * Macarthur Critchley, PA-C - assisting   History of Present Illness:    Patient is a 25 year old male status post above described procedures in the office and postsurgical follow-up.  He reports he is doing well.  He denies significant shortness of breath with the exception of on occasion when the weather is cold.  He unfortunately continues to smoke.  He has had no difficulty with his incisions.  Chest pain.  He is not having any fevers, chills or other significant constitutional symptoms.      Past Medical History:  Diagnosis Date   ADHD (attention deficit hyperactivity disorder)    DM type 1 (diabetes mellitus, type 1) ([place])    diagnosed age 70   Herpes    High cholesterol    Hypertension    Noncompliance with medications      Social History   Tobacco Use  Smoking Status Every Day   Packs/day: 1.00   Years: 5.00   Pack years: 5.00   Types: Cigarettes  Smokeless Tobacco Never    Social History   Substance and Sexual Activity  Alcohol Use No     Allergies  Allergen Reactions   Sulfa Antibiotics Hives and Other (See Comments)    Reaction:  Fever     Current Outpatient Medications  Medication Sig Dispense Refill   gabapentin (NEURONTIN) 300 MG capsule Take 300 mg by mouth 3 (three) times daily.      ibuprofen (MOTRIN IB) 200 MG tablet Take 2 tablets (400 mg total) by mouth every 6 (six) hours as needed for moderate pain. 100 tablet 2   insulin NPH-regular Human (70-30) 100 UNIT/ML injection Inject 40 Units into the skin 2 (two) times daily.     lisinopril (ZESTRIL) 10 MG tablet Take 10 mg by mouth daily.     risperiDONE (RISPERDAL) 3 MG tablet Take 3 mg by mouth daily.     SYMBICORT 160-4.5 MCG/ACT inhaler Inhale 2 puffs into the lungs 2 (two) times daily.     No current facility-administered medications for this visit.       Physical Exam: BP 116/72   Pulse 100   Resp 20   Ht 5\' 11"  (1.803 m)   Wt 146 lb (66.2 kg)   SpO2 97% Comment: RA  BMI 20.36 kg/m   General appearance: alert, cooperative, and no distress Heart: regular rate and rhythm Lungs: clear to auscultation bilaterally Wound: Incisions well-healed without evidence of infection   Diagnostic Studies & Laboratory data:     Recent Radiology Findings:   DG Chest 2 View  Result Date: 01/26/2021 CLINICAL DATA:  25 year old male with a history of spontaneous pneumothorax EXAM: CHEST - 2 VIEW COMPARISON:  Multiple prior most recent 12/25/2020 FINDINGS: Cardiomediastinal silhouette unchanged in size and contour. Interval removal of the right-sided thoracostomy tube. No pneumothorax. Surgical changes in the right suprahilar region No pleural effusion. No confluent airspace disease. No displaced fracture IMPRESSION: Surgical changes of the right suprahilar region, with interval removal of the right-sided thoracostomy tube, and no pneumothorax. Electronically Signed   By: Corrie Mckusick D.O.   On: 01/26/2021 15:27      Recent Lab Findings: Lab Results  Component Value Date   WBC 10.5 12/20/2020   HGB 12.2 (L) 12/20/2020   HCT 37.9 (L) 12/20/2020   PLT 465 (H) 12/20/2020   GLUCOSE 261 (H) 12/20/2020   ALT 8 12/12/2020   AST 15 12/12/2020   NA 135 12/20/2020   K 4.2 12/20/2020   CL 100 12/20/2020   CREATININE 1.10  12/20/2020   BUN 35 (H) 12/20/2020   CO2 28 12/20/2020   TSH 0.527 04/08/2014   HGBA1C 10.5 (H) 12/06/2020      Assessment / Plan: Patient is doing well.  His chest x-ray was evaluated and there are no worrisome findings.  His pneumothorax has resolved.  He does have some postsurgical changes in the right upper lobe.  I encouraged him to attempt smoking cessation and he understands the importance of this.  Additionally discussed general lifestyle improvements in terms of exercise that may improve overall lung function.  We will see him again on a as needed basis for any surgically related needs or at request.      Medication Changes: No orders of the defined types were placed in this encounter.     John Giovanni, PA-C  01/26/2021 3:35 PM

## 2021-01-26 NOTE — Patient Instructions (Signed)
Discussed lifestyle modifications.  This especially includes smoking cessation

## 2021-03-05 ENCOUNTER — Other Ambulatory Visit: Payer: Self-pay

## 2021-03-05 ENCOUNTER — Observation Stay (HOSPITAL_COMMUNITY)
Admission: EM | Admit: 2021-03-05 | Discharge: 2021-03-06 | Disposition: A | Discharge status: Home / Self Care | Payer: BC Managed Care – PPO | Attending: Internal Medicine | Admitting: Internal Medicine

## 2021-03-05 ENCOUNTER — Encounter (HOSPITAL_COMMUNITY): Payer: Self-pay

## 2021-03-05 ENCOUNTER — Emergency Department (HOSPITAL_COMMUNITY): Payer: BC Managed Care – PPO

## 2021-03-05 DIAGNOSIS — E1040 Type 1 diabetes mellitus with diabetic neuropathy, unspecified: Secondary | ICD-10-CM | POA: Insufficient documentation

## 2021-03-05 DIAGNOSIS — I10 Essential (primary) hypertension: Secondary | ICD-10-CM | POA: Insufficient documentation

## 2021-03-05 DIAGNOSIS — R112 Nausea with vomiting, unspecified: Secondary | ICD-10-CM

## 2021-03-05 DIAGNOSIS — D72829 Elevated white blood cell count, unspecified: Secondary | ICD-10-CM | POA: Insufficient documentation

## 2021-03-05 DIAGNOSIS — Z79899 Other long term (current) drug therapy: Secondary | ICD-10-CM | POA: Insufficient documentation

## 2021-03-05 DIAGNOSIS — Z20822 Contact with and (suspected) exposure to covid-19: Secondary | ICD-10-CM | POA: Insufficient documentation

## 2021-03-05 DIAGNOSIS — Z794 Long term (current) use of insulin: Secondary | ICD-10-CM | POA: Diagnosis not present

## 2021-03-05 DIAGNOSIS — F17210 Nicotine dependence, cigarettes, uncomplicated: Secondary | ICD-10-CM | POA: Insufficient documentation

## 2021-03-05 DIAGNOSIS — E1010 Type 1 diabetes mellitus with ketoacidosis without coma: Principal | ICD-10-CM | POA: Diagnosis present

## 2021-03-05 DIAGNOSIS — E109 Type 1 diabetes mellitus without complications: Secondary | ICD-10-CM

## 2021-03-05 DIAGNOSIS — R000 Tachycardia, unspecified: Secondary | ICD-10-CM | POA: Insufficient documentation

## 2021-03-05 DIAGNOSIS — R059 Cough, unspecified: Secondary | ICD-10-CM | POA: Diagnosis not present

## 2021-03-05 DIAGNOSIS — E1140 Type 2 diabetes mellitus with diabetic neuropathy, unspecified: Secondary | ICD-10-CM | POA: Diagnosis present

## 2021-03-05 LAB — URINALYSIS, ROUTINE W REFLEX MICROSCOPIC
Bacteria, UA: NONE SEEN
Bilirubin Urine: NEGATIVE
Glucose, UA: >=500 mg/dL — AB
Ketones, ur: 80 mg/dL — AB
Leukocytes,Ua: NEGATIVE
Nitrite: NEGATIVE
Protein, ur: 30 mg/dL — AB
Specific Gravity, Urine: 1.025 (ref 1.005–1.030)
pH: 6 (ref 5.0–8.0)

## 2021-03-05 LAB — BASIC METABOLIC PANEL
Anion gap: 18 — ABNORMAL HIGH (ref 5–15)
Anion gap: 14 (ref 5–15)
Anion gap: 12 (ref 5–15)
Anion gap: 10 (ref 5–15)
BUN: 62 mg/dL — ABNORMAL HIGH (ref 6–20)
BUN: 52 mg/dL — ABNORMAL HIGH (ref 6–20)
BUN: 48 mg/dL — ABNORMAL HIGH (ref 6–20)
BUN: 47 mg/dL — ABNORMAL HIGH (ref 6–20)
CO2: 19 mmol/L — ABNORMAL LOW (ref 22–32)
CO2: 22 mmol/L (ref 22–32)
CO2: 24 mmol/L (ref 22–32)
CO2: 25 mmol/L (ref 22–32)
Calcium: 8.9 mg/dL (ref 8.9–10.3)
Calcium: 8.6 mg/dL — ABNORMAL LOW (ref 8.9–10.3)
Calcium: 8.6 mg/dL — ABNORMAL LOW (ref 8.9–10.3)
Calcium: 8.3 mg/dL — ABNORMAL LOW (ref 8.9–10.3)
Chloride: 88 mmol/L — ABNORMAL LOW (ref 98–111)
Chloride: 97 mmol/L — ABNORMAL LOW (ref 98–111)
Chloride: 97 mmol/L — ABNORMAL LOW (ref 98–111)
Chloride: 97 mmol/L — ABNORMAL LOW (ref 98–111)
Creatinine, Ser: 1.57 mg/dL — ABNORMAL HIGH (ref 0.61–1.24)
Creatinine, Ser: 1.16 mg/dL (ref 0.61–1.24)
Creatinine, Ser: 1.08 mg/dL (ref 0.61–1.24)
Creatinine, Ser: 1.11 mg/dL (ref 0.61–1.24)
GFR, Estimated: >60 mL/min (ref >60)
GFR, Estimated: >60 mL/min (ref >60)
GFR, Estimated: >60 mL/min (ref >60)
GFR, Estimated: >60 mL/min (ref >60)
Glucose, Bld: 532 mg/dL (ref 70–99)
Glucose, Bld: 203 mg/dL — ABNORMAL HIGH (ref 70–99)
Glucose, Bld: 119 mg/dL — ABNORMAL HIGH (ref 70–99)
Glucose, Bld: 109 mg/dL — ABNORMAL HIGH (ref 70–99)
Potassium: 4.5 mmol/L (ref 3.5–5.1)
Potassium: 3.9 mmol/L (ref 3.5–5.1)
Potassium: 3.5 mmol/L (ref 3.5–5.1)
Potassium: 3.5 mmol/L (ref 3.5–5.1)
Sodium: 125 mmol/L — ABNORMAL LOW (ref 135–145)
Sodium: 133 mmol/L — ABNORMAL LOW (ref 135–145)
Sodium: 133 mmol/L — ABNORMAL LOW (ref 135–145)
Sodium: 132 mmol/L — ABNORMAL LOW (ref 135–145)

## 2021-03-05 LAB — BETA-HYDROXYBUTYRIC ACID
Beta-Hydroxybutyric Acid: 4.31 mmol/L — ABNORMAL HIGH (ref 0.05–0.27)
Beta-Hydroxybutyric Acid: 1.14 mmol/L — ABNORMAL HIGH (ref 0.05–0.27)

## 2021-03-05 LAB — BLOOD GAS, VENOUS
Acid-base deficit: 1.7 mmol/L (ref 0.0–2.0)
Bicarbonate: 23 mmol/L (ref 20.0–28.0)
Drawn by: 42237
FIO2: 21
O2 Saturation: 83.7 %
Patient temperature: 36.4
pCO2, Ven: 35.2 mmHg — ABNORMAL LOW (ref 44.0–60.0)
pH, Ven: 7.415 (ref 7.250–7.430)
pO2, Ven: 47.8 mmHg — ABNORMAL HIGH (ref 32.0–45.0)

## 2021-03-05 LAB — HEPATIC FUNCTION PANEL
ALT: 14 U/L (ref 0–44)
AST: 9 U/L — ABNORMAL LOW (ref 15–41)
Albumin: 3.1 g/dL — ABNORMAL LOW (ref 3.5–5.0)
Alkaline Phosphatase: 84 U/L (ref 38–126)
Bilirubin, Direct: 0.1 mg/dL (ref 0.0–0.2)
Indirect Bilirubin: 1.1 mg/dL — ABNORMAL HIGH (ref 0.3–0.9)
Total Bilirubin: 1.2 mg/dL (ref 0.3–1.2)
Total Protein: 5.8 g/dL — ABNORMAL LOW (ref 6.5–8.1)

## 2021-03-05 LAB — CBG MONITORING, ED
Glucose-Capillary: 511 mg/dL (ref 70–99)
Glucose-Capillary: 342 mg/dL — ABNORMAL HIGH (ref 70–99)
Glucose-Capillary: 170 mg/dL — ABNORMAL HIGH (ref 70–99)
Glucose-Capillary: 123 mg/dL — ABNORMAL HIGH (ref 70–99)
Glucose-Capillary: 108 mg/dL — ABNORMAL HIGH (ref 70–99)
Glucose-Capillary: 106 mg/dL — ABNORMAL HIGH (ref 70–99)

## 2021-03-05 LAB — CBC
HCT: 52.7 % — ABNORMAL HIGH (ref 39.0–52.0)
Hemoglobin: 17.9 g/dL — ABNORMAL HIGH (ref 13.0–17.0)
MCH: 30.1 pg (ref 26.0–34.0)
MCHC: 34 g/dL (ref 30.0–36.0)
MCV: 88.7 fL (ref 80.0–100.0)
Platelets: 468 10*3/uL — ABNORMAL HIGH (ref 150–400)
RBC: 5.94 MIL/uL — ABNORMAL HIGH (ref 4.22–5.81)
RDW: 12.8 % (ref 11.5–15.5)
WBC: 14.4 10*3/uL — ABNORMAL HIGH (ref 4.0–10.5)
nRBC: 0 % (ref 0.0–0.2)

## 2021-03-05 LAB — RESP PANEL BY RT-PCR (FLU A&B, COVID) ARPGX2
Influenza A by PCR: NEGATIVE
Influenza B by PCR: NEGATIVE
SARS Coronavirus 2 by RT PCR: NEGATIVE

## 2021-03-05 LAB — RAPID URINE DRUG SCREEN, HOSP PERFORMED
Amphetamines: NOT DETECTED
Barbiturates: NOT DETECTED
Benzodiazepines: NOT DETECTED
Cocaine: NOT DETECTED
Opiates: NOT DETECTED
Tetrahydrocannabinol: POSITIVE — AB

## 2021-03-05 MED ORDER — RISPERIDONE 1 MG PO TABS: 3.0000 mg | ORAL_TABLET | Freq: Every day | ORAL | Status: DC

## 2021-03-05 MED ORDER — DEXTROSE IN LACTATED RINGERS 5 % IV SOLN: INTRAVENOUS | Status: DC

## 2021-03-05 MED ORDER — INSULIN REGULAR(HUMAN) IN NACL 100-0.9 UT/100ML-% IV SOLN: INTRAVENOUS | Status: DC

## 2021-03-05 MED ORDER — GABAPENTIN 300 MG PO CAPS
300.0000 mg | ORAL_CAPSULE | Freq: Three times a day (TID) | ORAL | Status: DC
  Administered 2021-03-05: 22:00:00 300 mg via ORAL
  Filled 2021-03-05: qty 1

## 2021-03-05 MED ORDER — LISINOPRIL 10 MG PO TABS: 10.0000 mg | ORAL_TABLET | Freq: Every day | ORAL | Status: DC

## 2021-03-05 MED ORDER — ONDANSETRON HCL 4 MG/2ML IJ SOLN
4.0000 mg | Freq: Once | INTRAMUSCULAR | Status: AC
  Administered 2021-03-05: 18:00:00 4 mg via INTRAVENOUS
  Filled 2021-03-05: qty 2

## 2021-03-05 MED ORDER — ENOXAPARIN SODIUM 40 MG/0.4ML IJ SOSY
40.0000 mg | PREFILLED_SYRINGE | INTRAMUSCULAR | Status: DC
  Administered 2021-03-05: 22:00:00 40 mg via SUBCUTANEOUS
  Filled 2021-03-05: qty 0.4

## 2021-03-05 MED ORDER — INSULIN ASPART 100 UNIT/ML IJ SOLN
10.0000 [IU] | Freq: Once | INTRAMUSCULAR | Status: AC
  Administered 2021-03-05: 18:00:00 10 [IU] via SUBCUTANEOUS
  Filled 2021-03-05: qty 1

## 2021-03-05 MED ORDER — LACTATED RINGERS IV SOLN: INTRAVENOUS | Status: DC

## 2021-03-05 MED ORDER — INSULIN REGULAR(HUMAN) IN NACL 100-0.9 UT/100ML-% IV SOLN
INTRAVENOUS | Status: DC
  Administered 2021-03-05: 18:00:00 7.5 [IU]/h via INTRAVENOUS
  Filled 2021-03-05: qty 100

## 2021-03-05 MED ORDER — FLUTICASONE FUROATE-VILANTEROL 200-25 MCG/ACT IN AEPB
1.0000 | INHALATION_SPRAY | Freq: Every day | RESPIRATORY_TRACT | Status: DC
  Filled 2021-03-05: qty 28

## 2021-03-05 MED ORDER — POTASSIUM CHLORIDE 10 MEQ/100ML IV SOLN
10.0000 meq | INTRAVENOUS | Status: AC
  Administered 2021-03-05 (×2): 10 meq via INTRAVENOUS
  Filled 2021-03-05 (×2): qty 100

## 2021-03-05 MED ORDER — DEXTROSE 50 % IV SOLN: 0.0000 mL | INTRAVENOUS | Status: DC | PRN

## 2021-03-05 MED ORDER — LACTATED RINGERS IV BOLUS
2000.0000 mL | Freq: Once | INTRAVENOUS | Status: AC
  Administered 2021-03-05: 17:00:00 2000 mL via INTRAVENOUS

## 2021-03-05 MED ORDER — DEXTROSE 50 % IV SOLN
0.0000 mL | INTRAVENOUS | Status: DC | PRN
Start: 2021-03-05 — End: 2021-03-05

## 2021-03-05 MED ORDER — POTASSIUM CHLORIDE 10 MEQ/100ML IV SOLN
10.0000 meq | INTRAVENOUS | Status: AC
  Administered 2021-03-05 (×2): 10 meq via INTRAVENOUS
  Filled 2021-03-05 (×2): qty 100

## 2021-03-05 NOTE — H&P (Signed)
History and Physical  HOSTEEN KIENAST RJJ:[id number] DOB: 02/13/1996 DOA: 03/05/2021  Referring physician: Loura Halt, PA-C  PCP: Neale Burly, MD  Outpatient Specialists:   Patient Coming From: home  Chief Complaint: nausea and vomiting  HPI: TORRIS HOUSE is a 25 y.o. male with a history of DM1, htn. ADHD.  Patient presents with 4 days of emesis, generalized malaise, history of polysubstance abuse..  There have been no palliating or provoking factors.  His blood sugars have been quite elevated and in the 500 range.  He also states that he took his last dose of insulin this morning.  Upon further questioning, patient is not particularly compliant with his insulin.  The patient denies alcohol use or any other drug use.  Emergency Department Course: Patient found to be in DKA with an increased anion gap, blood sugars in the 500s, sodium in the 125's and a PCO2 of 19.  Review of Systems:   Pt denies any fevers, chills, diarrhea, constipation, abdominal pain, shortness of breath, dyspnea on exertion, orthopnea, cough, wheezing, palpitations, headache, vision changes, lightheadedness, dizziness, melena, rectal bleeding.  Review of systems are otherwise negative  Past Medical History:  Diagnosis Date   ADHD (attention deficit hyperactivity disorder)    DM type 1 (diabetes mellitus, type 1) ([place])    diagnosed age 24   Herpes    High cholesterol    Hypertension    Noncompliance with medications    Past Surgical History:  Procedure Laterality Date   INTERCOSTAL NERVE BLOCK Right 12/10/2020   Procedure: INTERCOSTAL NERVE BLOCK;  Surgeon: Lajuana Matte, MD;  Location: [place];  Service: Thoracic;  Laterality: Right;   none     PLEURADESIS Right 12/10/2020   Procedure: MECHANICAL PLEURADESIS;  Surgeon: Lajuana Matte, MD;  Location: [place];  Service: Thoracic;  Laterality: Right;   PLEURECTOMY Right 12/10/2020   Procedure: APICAL PLEURECTOMY;  Surgeon: Lajuana Matte, MD;  Location: [place];  Service: Thoracic;  Laterality: Right;   Social History:  reports that he has been smoking cigarettes. He has a 5.00 pack-year smoking history. He has never used smokeless tobacco. He reports that he does not currently use drugs after having used the following drugs: Marijuana, Other-see comments, and Benzodiazepines. He reports that he does not drink alcohol. Patient lives at home  Allergies  Allergen Reactions   Sulfa Antibiotics Hives and Other (See Comments)    Reaction:  Fever     Family History  Problem Relation Age of Onset   Diabetes Mellitus I Paternal Grandmother    Diabetes Mellitus I Paternal Aunt    Anxiety disorder Mother    Hypertension Mother    Hypertension Father       Prior to Admission medications   Medication Sig Start Date End Date Taking? Authorizing Provider  gabapentin (NEURONTIN) 300 MG capsule Take 300 mg by mouth 3 (three) times daily. 10/09/20  Yes [provider]  insulin NPH-regular Human (70-30) 100 UNIT/ML injection Inject 40 Units into the skin 2 (two) times daily.   Yes [provider]  lisinopril (ZESTRIL) 10 MG tablet Take 10 mg by mouth daily.   Yes [provider]  risperiDONE (RISPERDAL) 3 MG tablet Take 3 mg by mouth daily. 12/02/20  Yes [provider]  ibuprofen (MOTRIN IB) 200 MG tablet Take 2 tablets (400 mg total) by mouth every 6 (six) hours as needed for moderate pain. Patient not taking: Reported on 03/05/2021 12/22/20 12/22/21  Kc,  Maren Beach, MD  SYMBICORT 160-4.5 MCG/ACT inhaler Inhale 2 puffs into the lungs 2 (two) times daily. Patient not taking: Reported on 03/05/2021 11/30/20   [provider]    Physical Exam: BP (!) 139/106    Pulse (!) 114    Temp (!) 97.5 F (36.4 C) (Oral)    Resp 14    Ht 5\' 11"  (1.803 m)    Wt 63.5 kg    SpO2 98%    BMI 19.53 kg/m   General: Young male. Awake and alert and oriented x3. No acute cardiopulmonary distress.  HEENT:  Normocephalic atraumatic.  Right and left ears normal in appearance.  Pupils equal, round, reactive to light. Extraocular muscles are intact. Sclerae anicteric and noninjected.  Moist mucosal membranes. No mucosal lesions.  Neck: Neck supple without lymphadenopathy. No carotid bruits. No masses palpated.  Cardiovascular: Regular rate with normal S1-S2 sounds. No murmurs, rubs, gallops auscultated. No JVD.  Respiratory: Good respiratory effort with no wheezes, rales, rhonchi. Lungs clear to auscultation bilaterally.  No accessory muscle use. Abdomen: Soft, nontender, nondistended. Active bowel sounds. No masses or hepatosplenomegaly  Skin: No rashes, lesions, or ulcerations.  Dry, warm to touch. 2+ dorsalis pedis and radial pulses. Musculoskeletal: No calf or leg pain. All major joints not erythematous nontender.  No upper or lower joint deformation.  Good ROM.  No contractures  Psychiatric: Intact judgment and insight. Pleasant and cooperative. Neurologic: No focal neurological deficits. Strength is 5/5 and symmetric in upper and lower extremities.  Cranial nerves II through XII are grossly intact.           Labs on Admission: I have personally reviewed following labs and imaging studies  CBC: Recent Labs  Lab 03/05/21 1548  WBC 14.4*  HGB 17.9*  HCT 52.7*  MCV 88.7  PLT 540*   Basic Metabolic Panel: Recent Labs  Lab 03/05/21 1548 03/05/21 1921  NA 125* 133*  K 4.5 3.9  CL 88* 97*  CO2 19* 22  GLUCOSE 532* 203*  BUN 62* 52*  CREATININE 1.57* 1.16  CALCIUM 8.9 8.6*   GFR: Estimated Creatinine Clearance: 85.9 mL/min (by C-G formula based on SCr of 1.16 mg/dL). Liver Function Tests: Recent Labs  Lab 03/05/21 1921  AST 9*  ALT 14  ALKPHOS 84  BILITOT 1.2  PROT 5.8*  ALBUMIN 3.1*   No results for input(s): LIPASE, AMYLASE in the last 168 hours. No results for input(s): AMMONIA in the last 168 hours. Coagulation Profile: No results for input(s): INR, PROTIME in the  last 168 hours. Cardiac Enzymes: No results for input(s): CKTOTAL, CKMB, CKMBINDEX, TROPONINI in the last 168 hours. BNP (last 3 results) No results for input(s): PROBNP in the last 8760 hours. HbA1C: No results for input(s): HGBA1C in the last 72 hours. CBG: Recent Labs  Lab 03/05/21 1549 03/05/21 1846 03/05/21 1952  GLUCAP 511* 342* 170*   Lipid Profile: No results for input(s): CHOL, HDL, LDLCALC, TRIG, CHOLHDL, LDLDIRECT in the last 72 hours. Thyroid Function Tests: No results for input(s): TSH, T4TOTAL, FREET4, T3FREE, THYROIDAB in the last 72 hours. Anemia Panel: No results for input(s): VITAMINB12, FOLATE, FERRITIN, TIBC, IRON, RETICCTPCT in the last 72 hours. Urine analysis:    Component Value Date/Time   COLORURINE YELLOW 03/05/2021 1958   APPEARANCEUR CLEAR 03/05/2021 1958   LABSPEC 1.025 03/05/2021 1958   PHURINE 6.0 03/05/2021 1958   GLUCOSEU >=500 (A) 03/05/2021 1958   HGBUR MODERATE (A) 03/05/2021 [place] NEGATIVE 03/05/2021 1958  BILIRUBINUR neg 04/16/2014 1752   KETONESUR 80 (A) 03/05/2021 1958   PROTEINUR 30 (A) 03/05/2021 1958   UROBILINOGEN 0.2 10/17/2014 0743   NITRITE NEGATIVE 03/05/2021 1958   LEUKOCYTESUR NEGATIVE 03/05/2021 1958   Sepsis Labs: @LABRCNTIP (procalcitonin:4,lacticidven:4) ) Recent Results (from the past 240 hour(s))  Resp Panel by RT-PCR (Flu A&B, Covid) Nasopharyngeal Swab     Status: None   Collection Time: 03/05/21  3:48 PM   Specimen: Nasopharyngeal Swab; Nasopharyngeal(NP) swabs in vial transport medium  Result Value Ref Range Status   SARS Coronavirus 2 by RT PCR NEGATIVE NEGATIVE Final    Comment: (NOTE) SARS-CoV-2 target nucleic acids are NOT DETECTED.  The SARS-CoV-2 RNA is generally detectable in upper respiratory specimens during the acute phase of infection. The lowest concentration of SARS-CoV-2 viral copies this assay can detect is 138 copies/mL. A negative result does not preclude  SARS-Cov-2 infection and should not be used as the sole basis for treatment or other patient management decisions. A negative result may occur with  improper specimen collection/handling, submission of specimen other than nasopharyngeal swab, presence of viral mutation(s) within the areas targeted by this assay, and inadequate number of viral copies(<138 copies/mL). A negative result must be combined with clinical observations, patient history, and epidemiological information. The expected result is Negative.  Fact Sheet for Patients:  EntrepreneurPulse.com.au  Fact Sheet for Healthcare Providers:  IncredibleEmployment.be  This test is no t yet approved or cleared by the [place] FDA and  has been authorized for detection and/or diagnosis of SARS-CoV-2 by FDA under an Emergency Use Authorization (EUA). This EUA will remain  in effect (meaning this test can be used) for the duration of the COVID-19 declaration under Section 564(b)(1) of the Act, 21 U.S.C.section 360bbb-3(b)(1), unless the authorization is terminated  or revoked sooner.       Influenza A by PCR NEGATIVE NEGATIVE Final   Influenza B by PCR NEGATIVE NEGATIVE Final    Comment: (NOTE) The Xpert Xpress SARS-CoV-2/FLU/RSV plus assay is intended as an aid in the diagnosis of influenza from Nasopharyngeal swab specimens and should not be used as a sole basis for treatment. Nasal washings and aspirates are unacceptable for Xpert Xpress SARS-CoV-2/FLU/RSV testing.  Fact Sheet for Patients: EntrepreneurPulse.com.au  Fact Sheet for Healthcare Providers: IncredibleEmployment.be  This test is not yet approved or cleared by the [place] FDA and has been authorized for detection and/or diagnosis of SARS-CoV-2 by FDA under an Emergency Use Authorization (EUA). This EUA will remain in effect (meaning this test can be used) for the duration of  the COVID-19 declaration under Section 564(b)(1) of the Act, 21 U.S.C. section 360bbb-3(b)(1), unless the authorization is terminated or revoked.  Performed at [facility], [address] [place], [place] [address]      Radiological Exams on Admission: DG Chest Portable 1 View  Result Date: 03/05/2021 CLINICAL DATA:  Cough, emesis. EXAM: PORTABLE CHEST 1 VIEW COMPARISON:  Chest x-ray 01/26/2021. FINDINGS: Surgical staple line is again seen in the right upper lung. The lungs are otherwise clear. There is no pleural effusion or pneumothorax. Cardiomediastinal silhouette is within normal limits. No acute fractures are seen. IMPRESSION: No active disease. Electronically Signed   By: Ronney Asters M.D.   On: 03/05/2021 16:54    EKG: Independently reviewed.  Sinus tachycardia.  Borderline right axis deviation.  Probable left atrial enlargement.  Elevation of the ST segment in anterior leads.  Assessment/Plan: Active Problems:   DKA, type 1 (HCC)   Diabetic  neuropathy [facility])    This patient was discussed with the ED physician, including pertinent vitals, physical exam findings, labs, and imaging.  We also discussed care given by the ED provider.  DKA admit to stepdown Telemetry monitoring Continue aggressive IV hydration CBGs every hour Basic metabolic panel every 4 hours Potassium replacement: 10 mEq IV runs 2 Noncaloric liquids Transition to D5 normal saline at 150 mL's per hour with CBG at 250 Transition to home insulin regimen when gap is closed and acidosis resolved.  DVT prophylaxis: lovenox Consultants: none Code Status: full Family Communication:   Disposition Plan:    Truett Mainland, DO

## 2021-03-05 NOTE — ED Provider Notes (Signed)
[facility] EMERGENCY DEPARTMENT Provider Note   CSN: [id number] Arrival date & time: 03/05/21  1538     History Chief Complaint  Patient presents with   Emesis    Pedro [name] is a 25 y.o. male with history significant for Type 1 DM, medication noncompliance, polysubstance use, spontaneous pneumothorax who presents for evaluation of feeling unwell.  He has had 4 days of emesis, generalized myalgias, cough.  Has not taken a COVID test.  States his blood sugars at home have been in the high 500s.  States last dose of insulin was this morning.  Does state he has felt well and has not been taking his regular doses.  States his last admission for DKA was many years ago.  He denies any current polysubstance use.  No chest pain, shortness of breath, abdominal pain, diarrhea, dysuria.  Feels generally weak.  No rash or lesions.  Denies additional aggravating or alleviating factors  History obtained from patient and past medical records.  No interpreter used  HPI     Past Medical History:  Diagnosis Date   ADHD (attention deficit hyperactivity disorder)    DM type 1 (diabetes mellitus, type 1) ([place])    diagnosed age 87   Herpes    High cholesterol    Hypertension    Noncompliance with medications     Patient Active Problem List   Diagnosis Date Noted   Chest tube in place    Hyperglycemia 12/06/2020   Primary spontaneous pneumothorax 12/05/2020   DKA (diabetic ketoacidosis) ([place]) 11/09/2016   PNA (pneumonia) 07/04/2016   DM type 1 (diabetes mellitus, type 1) ([place]) 07/01/2016   Noncompliance with medications 07/01/2016   Polysubstance abuse ([place])    Tachycardia 10/17/2014   Acute renal failure ([place]) 10/17/2014   DKA (diabetic ketoacidoses) 03/19/2014   Diabetic neuropathy ([place]) 03/19/2014   Dehydration    Gastroenteritis 02/16/2014   Diabetic ketoacidosis without coma associated with type 1 diabetes mellitus ([place])    Genital herpes simplex type 2 12/15/2013   Tobacco  abuse 12/15/2013   DKA, type 1 ([place]) 12/15/2013   Diabetic ketoacidosis ([place]) 10/05/2013   Hyperkalemia 10/15/2012   Leukocytosis 10/15/2012   Noncompliance 10/15/2012   Polysubstance dependence ([place]) 07/22/2011    Class: Acute   Substance induced mood disorder ([place]) 07/22/2011    Class: Acute    Past Surgical History:  Procedure Laterality Date   INTERCOSTAL NERVE BLOCK Right 12/10/2020   Procedure: INTERCOSTAL NERVE BLOCK;  Surgeon: Lajuana Matte, MD;  Location: [facility];  Service: Thoracic;  Laterality: Right;   none     PLEURADESIS Right 12/10/2020   Procedure: MECHANICAL PLEURADESIS;  Surgeon: Lajuana Matte, MD;  Location: [facility] OR;  Service: Thoracic;  Laterality: Right;   PLEURECTOMY Right 12/10/2020   Procedure: APICAL PLEURECTOMY;  Surgeon: Lajuana Matte, MD;  Location: [facility] OR;  Service: Thoracic;  Laterality: Right;       Family History  Problem Relation Age of Onset   Diabetes Mellitus I Paternal Grandmother    Diabetes Mellitus I Paternal Aunt    Anxiety disorder Mother    Hypertension Mother    Hypertension Father     Social History   Tobacco Use   Smoking status: Every Day    Packs/day: 1.00    Years: 5.00    Pack years: 5.00    Types: Cigarettes   Smokeless tobacco: Never  Vaping Use   Vaping Use: Every day  Substance Use Topics  Alcohol use: No   Drug use: Not Currently    Types: Marijuana, Other-see comments, Benzodiazepines    Comment: occ    Home Medications Prior to Admission medications   Medication Sig Start Date End Date Taking? Authorizing Provider  gabapentin (NEURONTIN) 300 MG capsule Take 300 mg by mouth 3 (three) times daily. 10/09/20  Yes [provider]  insulin NPH-regular Human (70-30) 100 UNIT/ML injection Inject 40 Units into the skin 2 (two) times daily.   Yes [provider]  lisinopril (ZESTRIL) 10 MG tablet Take 10 mg by mouth daily.   Yes [provider]  risperiDONE (RISPERDAL) 3 MG  tablet Take 3 mg by mouth daily. 12/02/20  Yes [provider]  ibuprofen (MOTRIN IB) 200 MG tablet Take 2 tablets (400 mg total) by mouth every 6 (six) hours as needed for moderate pain. Patient not taking: Reported on 03/05/2021 12/22/20 12/22/21  Antonieta Pert, MD  SYMBICORT 160-4.5 MCG/ACT inhaler Inhale 2 puffs into the lungs 2 (two) times daily. Patient not taking: Reported on 03/05/2021 11/30/20   [provider]    Allergies    Sulfa antibiotics  Review of Systems   Review of Systems  Constitutional:  Positive for activity change, appetite change and fatigue.  HENT: Negative.    Respiratory:  Positive for cough. Negative for apnea, choking, chest tightness, shortness of breath, wheezing and stridor.   Cardiovascular: Negative.   Gastrointestinal:  Positive for nausea and vomiting. Negative for abdominal pain, anal bleeding, blood in stool, constipation, diarrhea and rectal pain.  Genitourinary: Negative.   Musculoskeletal: Negative.   Neurological:  Positive for weakness and light-headedness. Negative for dizziness, tremors, seizures, syncope, facial asymmetry, speech difficulty, numbness and headaches.  All other systems reviewed and are negative.  Physical Exam Updated Vital Signs BP (!) 144/110    Pulse (!) 111    Temp (!) 97.5 F (36.4 C) (Oral)    Resp 16    Ht 5\' 11"  (1.803 m)    Wt 63.5 kg    SpO2 97%    BMI 19.53 kg/m   Physical Exam Vitals and nursing note reviewed.  Constitutional:      General: He is in acute distress.     Appearance: He is well-developed. He is ill-appearing. He is not toxic-appearing or diaphoretic.  HENT:     Head: Normocephalic and atraumatic.     Jaw: There is normal jaw occlusion.     Nose: Nose normal.     Mouth/Throat:     Lips: Pink.     Mouth: Mucous membranes are dry.     Pharynx: Oropharynx is clear. Uvula midline.     Comments: PO clear, very dry mucous membranes Eyes:     Pupils: Pupils are equal, round, and  reactive to light.  Neck:     Trachea: Trachea and phonation normal.     Comments: Full ROM Cardiovascular:     Rate and Rhythm: Regular rhythm. Tachycardia present.     Pulses: Normal pulses.          Radial pulses are 2+ on the right side and 2+ on the left side.       Dorsalis pedis pulses are 2+ on the right side and 2+ on the left side.     Heart sounds: Normal heart sounds.     Comments: Tachycardic Pulmonary:     Effort: Pulmonary effort is normal. No respiratory distress.     Breath sounds: Normal breath sounds and air  entry.     Comments: Clear Bl, speaks in full sentences without difficulty Chest:     Comments: Non tender Abdominal:     General: Bowel sounds are normal. There is no distension.     Palpations: Abdomen is soft.     Tenderness: There is no abdominal tenderness. There is no right CVA tenderness, left CVA tenderness or guarding.     Hernia: No hernia is present.     Comments: Abd soft, non tender  Musculoskeletal:        General: No swelling, tenderness, deformity or signs of injury. Normal range of motion.     Cervical back: Full passive range of motion without pain, normal range of motion and neck supple.     Right lower leg: No edema.     Left lower leg: No edema.     Comments: No midline C/T/L tenderness, full range of motion without difficulty.  No bony tenderness bilateral upper/lower extremity  Skin:    General: Skin is warm and dry.     Capillary Refill: Capillary refill takes less than 2 seconds.     Comments: No rash or lesions  Neurological:     General: No focal deficit present.     Mental Status: He is alert and oriented to person, place, and time. Mental status is at baseline.     Cranial Nerves: Cranial nerves 2-12 are intact.     Sensory: Sensation is intact.     Motor: Motor function is intact.     Gait: Gait is intact.     Comments: CN 2-12 grossly intact Ambulatory    ED Results / Procedures / Treatments   Labs (all labs ordered  are listed, but only abnormal results are displayed) Labs Reviewed  BASIC METABOLIC PANEL - Abnormal; Notable for the following components:      Result Value   Sodium 125 (*)    Chloride 88 (*)    CO2 19 (*)    Glucose, Bld 532 (*)    BUN 62 (*)    Creatinine, Ser 1.57 (*)    Anion gap 18 (*)    All other components within normal limits  CBC - Abnormal; Notable for the following components:   WBC 14.4 (*)    RBC 5.94 (*)    Hemoglobin 17.9 (*)    HCT 52.7 (*)    Platelets 468 (*)    All other components within normal limits  BETA-HYDROXYBUTYRIC ACID - Abnormal; Notable for the following components:   Beta-Hydroxybutyric Acid 4.31 (*)    All other components within normal limits  BLOOD GAS, VENOUS - Abnormal; Notable for the following components:   pCO2, Ven 35.2 (*)    pO2, Ven 47.8 (*)    All other components within normal limits  CBG MONITORING, ED - Abnormal; Notable for the following components:   Glucose-Capillary 511 (*)    All other components within normal limits  CBG MONITORING, ED - Abnormal; Notable for the following components:   Glucose-Capillary 342 (*)    All other components within normal limits  RESP PANEL BY RT-PCR (FLU A&B, COVID) [id number]  URINALYSIS, ROUTINE W REFLEX MICROSCOPIC  BASIC METABOLIC PANEL  BASIC METABOLIC PANEL  BASIC METABOLIC PANEL  RAPID URINE DRUG SCREEN, HOSP PERFORMED  HEPATIC FUNCTION PANEL    EKG EKG Interpretation  Date/Time:  Friday March 05 2021 17:08:46 EST Ventricular Rate:  126 PR Interval:  114 QRS Duration: 84 QT Interval:  312 QTC Calculation: 452 R  Axis:   103 Text Interpretation: Sinus tachycardia LAE, consider biatrial enlargement Borderline right axis deviation Borderline repolarization abnormality ST elevation, consider anterior injury Baseline wander in lead(s) V2 V5 no change compared with prior Confirmed by Noemi Chapel [phone]) on 03/05/2021 5:14:49 PM  Radiology DG Chest Portable 1 View  Result Date:  03/05/2021 CLINICAL DATA:  Cough, emesis. EXAM: PORTABLE CHEST 1 VIEW COMPARISON:  Chest x-ray 01/26/2021. FINDINGS: Surgical staple line is again seen in the right upper lung. The lungs are otherwise clear. There is no pleural effusion or pneumothorax. Cardiomediastinal silhouette is within normal limits. No acute fractures are seen. IMPRESSION: No active disease. Electronically Signed   By: Ronney Asters M.D.   On: 03/05/2021 16:54    Procedures .Critical Care Performed by: Nettie Elm, PA-C Authorized by: Nettie Elm, PA-C   Critical care provider statement:    Critical care time (minutes):  35   Critical care was necessary to treat or prevent imminent or life-threatening deterioration of the following conditions:  Dehydration and endocrine crisis   Critical care was time spent personally by me on the following activities:  Development of treatment plan with patient or surrogate, discussions with consultants, evaluation of patient's response to treatment, examination of patient, ordering and review of laboratory studies, ordering and review of radiographic studies, ordering and performing treatments and interventions, pulse oximetry, re-evaluation of patient's condition, review of old charts and obtaining history from patient or surrogate   Care discussed with: admitting provider     Medications Ordered in ED Medications  insulin regular, human (MYXREDLIN) 100 units/ 100 mL infusion (7.5 Units/hr Intravenous New Bag/Given 03/05/21 1806)  lactated ringers infusion ( Intravenous New Bag/Given 03/05/21 1808)  dextrose 5 % in lactated ringers infusion (has no administration in time range)  dextrose 50 % solution 0-50 mL (has no administration in time range)  potassium chloride 10 mEq in 100 mL IVPB (10 mEq Intravenous New Bag/Given 03/05/21 1759)  lactated ringers bolus 2,000 mL (2,000 mLs Intravenous New Bag/Given 03/05/21 1653)  insulin aspart (novoLOG) injection 10 Units (10  Units Subcutaneous Given 03/05/21 1756)  ondansetron (ZOFRAN) injection 4 mg (4 mg Intravenous Given 03/05/21 1757)    ED Course  I have reviewed the triage vital signs and the nursing notes.  Pertinent labs & imaging results that were available during my care of the patient were reviewed by me and considered in my medical decision making (see chart for details).  Pleasant 25 year old here for evaluation of vomiting over the last 4 days.  Type I diabetic blood sugars of in the high 500s.  He is now on insulin pump.  On arrival he is tachycardic, appears ill.  He appears clinically very dehydrated.  Abdomen soft, nontender.  Heart and lungs clear.  No obvious infectious source of his feeling unwell.  We will plan on IV fluids, suspect DKA, will need to be started on insulin drip once electrolytes result however in meantime will give bolus of insulin.  Labs and imaging personally reviewed and interpreted: CBC leukocytosis 14.4, hemoglobin 17.9, suspect hemoconcentration from dehydration Metabolic panel with sodium 125, suspect pseudohyponatremia due to hyperglycemia, CO2 19, BUN elevated at 62 again, suspect due to dehydration, creatinine 1.57, up from baseline creatinine in epic, anion gap 18 Beta hydroxy 4.31 VBG pH 7.4  Patient given IV bolus, continuous IV fluids as well as started on insulin drip.  Patient critically ill in DKA, will admit for further management and work-up.  I have low  suspicion for sepsis, acute intra-abdominal etiology of his earlier nausea and vomiting.  Suspect tachycardia due to DKA, dehydration, low suspicion for acute PE, dissection, ACS.  CONSULT with Dr. Towanda Octave with [facility] who is agreeable with above treatment, plan and disposition    MDM Rules/Calculators/A&P                             Final Clinical Impression(s) / ED Diagnoses Final diagnoses:  Diabetic ketoacidosis without coma associated with type 1 diabetes mellitus ([place])  Nausea and vomiting,  unspecified vomiting type    Rx / DC Orders ED Discharge Orders     None        Kyson Kupper A, PA-C 03/05/21 1907    Noemi Chapel, MD 03/06/21 1101

## 2021-03-05 NOTE — ED Triage Notes (Signed)
Pt presents to ED with vomiting x 4 days. Pt is type 1 diabetic. Pt states CBG is high.

## 2021-03-06 DIAGNOSIS — E1010 Type 1 diabetes mellitus with ketoacidosis without coma: Secondary | ICD-10-CM

## 2021-03-06 LAB — BASIC METABOLIC PANEL
Anion gap: 7 (ref 5–15)
Anion gap: 8 (ref 5–15)
BUN: 40 mg/dL — ABNORMAL HIGH (ref 6–20)
BUN: 37 mg/dL — ABNORMAL HIGH (ref 6–20)
CO2: 26 mmol/L (ref 22–32)
CO2: 26 mmol/L (ref 22–32)
Calcium: 8.1 mg/dL — ABNORMAL LOW (ref 8.9–10.3)
Calcium: 8 mg/dL — ABNORMAL LOW (ref 8.9–10.3)
Chloride: 99 mmol/L (ref 98–111)
Chloride: 98 mmol/L (ref 98–111)
Creatinine, Ser: 1.08 mg/dL (ref 0.61–1.24)
Creatinine, Ser: 0.99 mg/dL (ref 0.61–1.24)
GFR, Estimated: >60 mL/min (ref >60)
GFR, Estimated: >60 mL/min (ref >60)
Glucose, Bld: 132 mg/dL — ABNORMAL HIGH (ref 70–99)
Glucose, Bld: 235 mg/dL — ABNORMAL HIGH (ref 70–99)
Potassium: 3.8 mmol/L (ref 3.5–5.1)
Potassium: 3.7 mmol/L (ref 3.5–5.1)
Sodium: 132 mmol/L — ABNORMAL LOW (ref 135–145)
Sodium: 132 mmol/L — ABNORMAL LOW (ref 135–145)

## 2021-03-06 LAB — CBG MONITORING, ED
Glucose-Capillary: 114 mg/dL — ABNORMAL HIGH (ref 70–99)
Glucose-Capillary: 150 mg/dL — ABNORMAL HIGH (ref 70–99)
Glucose-Capillary: 169 mg/dL — ABNORMAL HIGH (ref 70–99)
Glucose-Capillary: 212 mg/dL — ABNORMAL HIGH (ref 70–99)
Glucose-Capillary: 243 mg/dL — ABNORMAL HIGH (ref 70–99)
Glucose-Capillary: 264 mg/dL — ABNORMAL HIGH (ref 70–99)

## 2021-03-06 LAB — BETA-HYDROXYBUTYRIC ACID: Beta-Hydroxybutyric Acid: 1.05 mmol/L — ABNORMAL HIGH (ref 0.05–0.27)

## 2021-03-06 LAB — HEMOGLOBIN A1C
Hgb A1c MFr Bld: 12 % — ABNORMAL HIGH (ref 4.8–5.6)
Mean Plasma Glucose: 297.7 mg/dL

## 2021-03-06 MED ORDER — INSULIN ASPART 100 UNIT/ML IJ SOLN
0.0000 [IU] | Freq: Three times a day (TID) | INTRAMUSCULAR | Status: DC
  Administered 2021-03-06: 5 [IU] via SUBCUTANEOUS
  Filled 2021-03-06: qty 1

## 2021-03-06 MED ORDER — INSULIN ASPART 100 UNIT/ML IJ SOLN: 0.0000 [IU] | Freq: Every day | INTRAMUSCULAR | Status: DC

## 2021-03-06 MED ORDER — INSULIN ASPART 100 UNIT/ML IJ SOLN
3.0000 [IU] | Freq: Three times a day (TID) | INTRAMUSCULAR | Status: DC
  Administered 2021-03-06: 3 [IU] via SUBCUTANEOUS
  Filled 2021-03-06: qty 1

## 2021-03-06 MED ORDER — INSULIN GLARGINE-YFGN 100 UNIT/ML ~~LOC~~ SOLN
10.0000 [IU] | Freq: Every day | SUBCUTANEOUS | Status: DC
  Administered 2021-03-06: 10 [IU] via SUBCUTANEOUS
  Filled 2021-03-06: qty 0.1

## 2021-03-06 NOTE — ED Notes (Signed)
Tolerated po diet this morning.  No nausea or vomiting.

## 2021-03-06 NOTE — Progress Notes (Signed)
DKA Blood glucose 212, bicarb 26 She was started on insulin drip, IV LR with IV potassium per DKA protocol in the ED Anion gap already closed, currently at 7 Continue IV D5LR and maintain blood glucose within 200-250 mg/dL Clear liquid consistent carb diet will be provided Subcu insulin 10 units x 1 will be given Continue IV D5 LR for about 2 hours after subcu insulin and check BMP and VBG prior to discontinuing IV drip Notify physician if patient is able to tolerate above without vomiting and patient will be started on basal insulin regimen and sliding scale If patient continues to have nausea/vomiting, patient may require further IV D5 half-normal saline and titrated insulin drip pending better control of nausea/vomiting.  Total time:  10 minutes This includes time reviewing the chart including progress notes, labs, EKGs, taking medical decisions, ordering labs and documenting findings.

## 2021-03-06 NOTE — Discharge Summary (Signed)
Physician Discharge Summary  Pedro [name] AST:[id number] DOB: 1995-06-06 DOA: 03/05/2021  PCP: Neale Burly, MD  Admit date: 03/05/2021  Discharge date: 03/06/2021  Admitted From:Home  Disposition:  Home  Recommendations for Outpatient Follow-up:  Follow up with PCP in 1-2 weeks Referral sent to endocrinology for outpatient follow-up Continue home 70/30 insulin as previously recommended and encouraged compliance  Home Health: None  Equipment/Devices: None  Discharge Condition:Stable  CODE STATUS: Full  Diet recommendation: Heart Healthy/carb modified  Brief/Interim Summary: Per HPI: Pedro [name] is a 25 y.o. male with a history of DM1, htn. ADHD.  Patient presents with 4 days of emesis, generalized malaise, history of polysubstance abuse..  There have been no palliating or provoking factors.  His blood sugars have been quite elevated and in the 500 range.  He also states that he took his last dose of insulin this morning.  Upon further questioning, patient is not particularly compliant with his insulin.  The patient denies alcohol use or any other drug use.  -Patient was admitted for DKA and had resolution of this overnight with administration of IV insulin and IV fluid.  He no longer has any further nausea, vomiting, or any abdominal pain.  He is tolerating diet and is in stable condition for discharge.  He was encouraged to remain compliant on his home insulin and does not want any changes to his regimen as he claims to become hypoglycemic at times with even his current doses.  It appears that he has labile blood glucose readings at home and he would rather have elevated blood glucose readings in the 200-300 range rather than go too low.  I agree that this would be rather unsafe for him to go to low and have recommended referral to endocrinology.  Discharge Diagnoses:  Active Problems:   DKA, type 1 ([place])   Diabetic neuropathy ([place])  Principal discharge diagnosis:  DKA in the setting of type 1 diabetes.  Discharge Instructions  Discharge Instructions     Ambulatory referral to Endocrinology   Complete by: As directed    Diet - low sodium heart healthy   Complete by: As directed    Increase activity slowly   Complete by: As directed       Allergies as of 03/06/2021       Reactions   Sulfa Antibiotics Hives, Other (See Comments)   Reaction:  Fever        Medication List     TAKE these medications    gabapentin 300 MG capsule Commonly known as: NEURONTIN Take 300 mg by mouth 3 (three) times daily.   ibuprofen 200 MG tablet Commonly known as: Motrin IB Take 2 tablets (400 mg total) by mouth every 6 (six) hours as needed for moderate pain.   insulin NPH-regular Human (70-30) 100 UNIT/ML injection Inject 40 Units into the skin 2 (two) times daily.   lisinopril 10 MG tablet Commonly known as: ZESTRIL Take 10 mg by mouth daily.   risperiDONE 3 MG tablet Commonly known as: RISPERDAL Take 3 mg by mouth daily.   Symbicort 160-4.5 MCG/ACT inhaler Generic drug: budesonide-formoterol Inhale 2 puffs into the lungs 2 (two) times daily.        Follow-up Information     Neale Burly, MD. Schedule an appointment as soon as possible for a visit in 1 week(s).   Specialty: Internal Medicine Contact information: [address] [place] [address] 194 [phone]  Cassandria Anger, MD. Schedule an appointment as soon as possible for a visit in 1 week(s).   Specialty: Endocrinology Contact information: [place] [address] [phone]                Allergies  Allergen Reactions   Sulfa Antibiotics Hives and Other (See Comments)    Reaction:  Fever     Consultations: None   Procedures/Studies: DG Chest Portable 1 View  Result Date: 03/05/2021 CLINICAL DATA:  Cough, emesis. EXAM: PORTABLE CHEST 1 VIEW COMPARISON:  Chest x-ray 01/26/2021. FINDINGS: Surgical staple line is  again seen in the right upper lung. The lungs are otherwise clear. There is no pleural effusion or pneumothorax. Cardiomediastinal silhouette is within normal limits. No acute fractures are seen. IMPRESSION: No active disease. Electronically Signed   By: Ronney Asters M.D.   On: 03/05/2021 16:54     Discharge Exam: Vitals:   03/06/21 0645 03/06/21 0700  BP: (!) 125/91 (!) 133/98  Pulse: (!) 106 (!) 101  Resp: 14 14  Temp:    SpO2: 98% 97%   Vitals:   03/06/21 0330 03/06/21 0630 03/06/21 0645 03/06/21 0700  BP: (!) 145/91  (!) 125/91 (!) 133/98  Pulse: 99 98 (!) 106 (!) 101  Resp: 12 13 14 14   Temp:      TempSrc:      SpO2: 97% 98% 98% 97%  Weight:      Height:        General: Pt is alert, awake, not in acute distress Cardiovascular: RRR, S1/S2 +, no rubs, no gallops Respiratory: CTA bilaterally, no wheezing, no rhonchi Abdominal: Soft, NT, ND, bowel sounds + Extremities: no edema, no cyanosis    The results of significant diagnostics from this hospitalization (including imaging, microbiology, ancillary and laboratory) are listed below for reference.     Microbiology: Recent Results (from the past 240 hour(s))  Resp Panel by RT-PCR (Flu A&B, Covid) Nasopharyngeal Swab     Status: None   Collection Time: 03/05/21  3:48 PM   Specimen: Nasopharyngeal Swab; Nasopharyngeal(NP) swabs in vial transport medium  Result Value Ref Range Status   SARS Coronavirus 2 by RT PCR NEGATIVE NEGATIVE Final    Comment: (NOTE) SARS-CoV-2 target nucleic acids are NOT DETECTED.  The SARS-CoV-2 RNA is generally detectable in upper respiratory specimens during the acute phase of infection. The lowest concentration of SARS-CoV-2 viral copies this assay can detect is 138 copies/mL. A negative result does not preclude SARS-Cov-2 infection and should not be used as the sole basis for treatment or other patient management decisions. A negative result may occur with  improper specimen  collection/handling, submission of specimen other than nasopharyngeal swab, presence of viral mutation(s) within the areas targeted by this assay, and inadequate number of viral copies(<138 copies/mL). A negative result must be combined with clinical observations, patient history, and epidemiological information. The expected result is Negative.  Fact Sheet for Patients:  EntrepreneurPulse.com.au  Fact Sheet for Healthcare Providers:  IncredibleEmployment.be  This test is no t yet approved or cleared by the [place] FDA and  has been authorized for detection and/or diagnosis of SARS-CoV-2 by FDA under an Emergency Use Authorization (EUA). This EUA will remain  in effect (meaning this test can be used) for the duration of the COVID-19 declaration under Section 564(b)(1) of the Act, 21 U.S.C.section 360bbb-3(b)(1), unless the authorization is terminated  or revoked sooner.       Influenza A by PCR NEGATIVE  NEGATIVE Final   Influenza B by PCR NEGATIVE NEGATIVE Final    Comment: (NOTE) The Xpert Xpress SARS-CoV-2/FLU/RSV plus assay is intended as an aid in the diagnosis of influenza from Nasopharyngeal swab specimens and should not be used as a sole basis for treatment. Nasal washings and aspirates are unacceptable for Xpert Xpress SARS-CoV-2/FLU/RSV testing.  Fact Sheet for Patients: EntrepreneurPulse.com.au  Fact Sheet for Healthcare Providers: IncredibleEmployment.be  This test is not yet approved or cleared by the [place] FDA and has been authorized for detection and/or diagnosis of SARS-CoV-2 by FDA under an Emergency Use Authorization (EUA). This EUA will remain in effect (meaning this test can be used) for the duration of the COVID-19 declaration under Section 564(b)(1) of the Act, 21 U.S.C. section 360bbb-3(b)(1), unless the authorization is terminated or revoked.  Performed at [facility], [address], [place], [place] [address]      Labs: BNP (last 3 results) No results for input(s): BNP in the last 8760 hours. Basic Metabolic Panel: Recent Labs  Lab 03/05/21 1921 03/05/21 2122 03/05/21 2236 03/06/21 0145 03/06/21 0558  NA 133* 133* 132* 132* 132*  K 3.9 3.5 3.5 3.8 3.7  CL 97* 97* 97* 99 98  CO2 22 24 25 26 26   GLUCOSE 203* 119* 109* 132* 235*  BUN 52* 48* 47* 40* 37*  CREATININE 1.16 1.08 1.11 1.08 0.99  CALCIUM 8.6* 8.6* 8.3* 8.1* 8.0*   Liver Function Tests: Recent Labs  Lab 03/05/21 1921  AST 9*  ALT 14  ALKPHOS 84  BILITOT 1.2  PROT 5.8*  ALBUMIN 3.1*   No results for input(s): LIPASE, AMYLASE in the last 168 hours. No results for input(s): AMMONIA in the last 168 hours. CBC: Recent Labs  Lab 03/05/21 1548  WBC 14.4*  HGB 17.9*  HCT 52.7*  MCV 88.7  PLT 468*   Cardiac Enzymes: No results for input(s): CKTOTAL, CKMB, CKMBINDEX, TROPONINI in the last 168 hours. BNP: Invalid input(s): POCBNP CBG: Recent Labs  Lab 03/06/21 0203 03/06/21 0330 03/06/21 0456 03/06/21 0643 03/06/21 0755  GLUCAP 150* 169* 212* 243* 264*   D-Dimer No results for input(s): DDIMER in the last 72 hours. Hgb A1c No results for input(s): HGBA1C in the last 72 hours. Lipid Profile No results for input(s): CHOL, HDL, LDLCALC, TRIG, CHOLHDL, LDLDIRECT in the last 72 hours. Thyroid function studies No results for input(s): TSH, T4TOTAL, T3FREE, THYROIDAB in the last 72 hours.  Invalid input(s): FREET3 Anemia work up No results for input(s): VITAMINB12, FOLATE, FERRITIN, TIBC, IRON, RETICCTPCT in the last 72 hours. Urinalysis    Component Value Date/Time   COLORURINE YELLOW 03/05/2021 1958   APPEARANCEUR CLEAR 03/05/2021 1958   LABSPEC 1.025 03/05/2021 1958   PHURINE 6.0 03/05/2021 1958   GLUCOSEU >=500 (A) 03/05/2021 1958   HGBUR MODERATE (A) 03/05/2021 [place] NEGATIVE 03/05/2021 1958   BILIRUBINUR neg 04/16/2014 1752    KETONESUR 80 (A) 03/05/2021 1958   PROTEINUR 30 (A) 03/05/2021 1958   UROBILINOGEN 0.2 10/17/2014 0743   NITRITE NEGATIVE 03/05/2021 1958   LEUKOCYTESUR NEGATIVE 03/05/2021 1958   Sepsis Labs Invalid input(s): PROCALCITONIN,  WBC,  LACTICIDVEN Microbiology Recent Results (from the past 240 hour(s))  Resp Panel by RT-PCR (Flu A&B, Covid) Nasopharyngeal Swab     Status: None   Collection Time: 03/05/21  3:48 PM   Specimen: Nasopharyngeal Swab; Nasopharyngeal(NP) swabs in vial transport medium  Result Value Ref Range Status   SARS Coronavirus 2 by RT PCR  NEGATIVE NEGATIVE Final    Comment: (NOTE) SARS-CoV-2 target nucleic acids are NOT DETECTED.  The SARS-CoV-2 RNA is generally detectable in upper respiratory specimens during the acute phase of infection. The lowest concentration of SARS-CoV-2 viral copies this assay can detect is 138 copies/mL. A negative result does not preclude SARS-Cov-2 infection and should not be used as the sole basis for treatment or other patient management decisions. A negative result may occur with  improper specimen collection/handling, submission of specimen other than nasopharyngeal swab, presence of viral mutation(s) within the areas targeted by this assay, and inadequate number of viral copies(<138 copies/mL). A negative result must be combined with clinical observations, patient history, and epidemiological information. The expected result is Negative.  Fact Sheet for Patients:  EntrepreneurPulse.com.au  Fact Sheet for Healthcare Providers:  IncredibleEmployment.be  This test is no t yet approved or cleared by the [place] FDA and  has been authorized for detection and/or diagnosis of SARS-CoV-2 by FDA under an Emergency Use Authorization (EUA). This EUA will remain  in effect (meaning this test can be used) for the duration of the COVID-19 declaration under Section 564(b)(1) of the Act, 21 U.S.C.section  360bbb-3(b)(1), unless the authorization is terminated  or revoked sooner.       Influenza A by PCR NEGATIVE NEGATIVE Final   Influenza B by PCR NEGATIVE NEGATIVE Final    Comment: (NOTE) The Xpert Xpress SARS-CoV-2/FLU/RSV plus assay is intended as an aid in the diagnosis of influenza from Nasopharyngeal swab specimens and should not be used as a sole basis for treatment. Nasal washings and aspirates are unacceptable for Xpert Xpress SARS-CoV-2/FLU/RSV testing.  Fact Sheet for Patients: EntrepreneurPulse.com.au  Fact Sheet for Healthcare Providers: IncredibleEmployment.be  This test is not yet approved or cleared by the [place] FDA and has been authorized for detection and/or diagnosis of SARS-CoV-2 by FDA under an Emergency Use Authorization (EUA). This EUA will remain in effect (meaning this test can be used) for the duration of the COVID-19 declaration under Section 564(b)(1) of the Act, 21 U.S.C. section 360bbb-3(b)(1), unless the authorization is terminated or revoked.  Performed at [facility], [address], [place], [place] [address]      Time coordinating discharge: 35 minutes  SIGNED:   Rodena Goldmann, DO [facility] 03/06/2021, 9:58 AM  If 7PM-7AM, please contact night-coverage [email or web address]

## 2021-03-06 NOTE — ED Notes (Signed)
Gave pt diet sprite. Tolerating well

## 2021-03-23 ENCOUNTER — Ambulatory Visit: Payer: BC Managed Care – PPO | Admitting: Nurse Practitioner

## 2021-06-30 ENCOUNTER — Observation Stay (HOSPITAL_COMMUNITY)
Admission: EM | Admit: 2021-06-30 | Discharge: 2021-07-01 | Disposition: A | Discharge status: Home / Self Care | Payer: BC Managed Care – PPO | Attending: Family Medicine | Admitting: Family Medicine

## 2021-06-30 ENCOUNTER — Encounter (HOSPITAL_COMMUNITY): Payer: Self-pay

## 2021-06-30 ENCOUNTER — Other Ambulatory Visit: Payer: Self-pay

## 2021-06-30 DIAGNOSIS — E1110 Type 2 diabetes mellitus with ketoacidosis without coma: Secondary | ICD-10-CM | POA: Diagnosis present

## 2021-06-30 DIAGNOSIS — E875 Hyperkalemia: Secondary | ICD-10-CM | POA: Diagnosis present

## 2021-06-30 DIAGNOSIS — Z79899 Other long term (current) drug therapy: Secondary | ICD-10-CM | POA: Insufficient documentation

## 2021-06-30 DIAGNOSIS — E1010 Type 1 diabetes mellitus with ketoacidosis without coma: Principal | ICD-10-CM | POA: Insufficient documentation

## 2021-06-30 DIAGNOSIS — F17210 Nicotine dependence, cigarettes, uncomplicated: Secondary | ICD-10-CM | POA: Diagnosis present

## 2021-06-30 DIAGNOSIS — N179 Acute kidney failure, unspecified: Secondary | ICD-10-CM | POA: Diagnosis present

## 2021-06-30 DIAGNOSIS — Z794 Long term (current) use of insulin: Secondary | ICD-10-CM | POA: Insufficient documentation

## 2021-06-30 DIAGNOSIS — E860 Dehydration: Secondary | ICD-10-CM | POA: Diagnosis present

## 2021-06-30 DIAGNOSIS — E871 Hypo-osmolality and hyponatremia: Secondary | ICD-10-CM

## 2021-06-30 DIAGNOSIS — Z720 Tobacco use: Secondary | ICD-10-CM | POA: Diagnosis present

## 2021-06-30 LAB — CBC WITH DIFFERENTIAL/PLATELET
Abs Immature Granulocytes: 0.03 10*3/uL (ref 0.00–0.07)
Abs Immature Granulocytes: 0.02 10*3/uL (ref 0.00–0.07)
Basophils Absolute: 0.1 10*3/uL (ref 0.0–0.1)
Basophils Absolute: 0.1 10*3/uL (ref 0.0–0.1)
Basophils Relative: 1 %
Basophils Relative: 1 %
Eosinophils Absolute: 0 10*3/uL (ref 0.0–0.5)
Eosinophils Absolute: 0 10*3/uL (ref 0.0–0.5)
Eosinophils Relative: 1 %
Eosinophils Relative: 0 %
HCT: 45.7 % (ref 39.0–52.0)
HCT: 43.4 % (ref 39.0–52.0)
Hemoglobin: 14 g/dL (ref 13.0–17.0)
Hemoglobin: 14.2 g/dL (ref 13.0–17.0)
Immature Granulocytes: 0 %
Immature Granulocytes: 0 %
Lymphocytes Relative: 15 %
Lymphocytes Relative: 22 %
Lymphs Abs: 1.2 10*3/uL (ref 0.7–4.0)
Lymphs Abs: 1.8 10*3/uL (ref 0.7–4.0)
MCH: 29.8 pg (ref 26.0–34.0)
MCH: 29.7 pg (ref 26.0–34.0)
MCHC: 30.6 g/dL (ref 30.0–36.0)
MCHC: 32.7 g/dL (ref 30.0–36.0)
MCV: 97.2 fL (ref 80.0–100.0)
MCV: 90.8 fL (ref 80.0–100.0)
Monocytes Absolute: 0.3 10*3/uL (ref 0.1–1.0)
Monocytes Absolute: 0.3 10*3/uL (ref 0.1–1.0)
Monocytes Relative: 4 %
Monocytes Relative: 4 %
Neutro Abs: 6.4 10*3/uL (ref 1.7–7.7)
Neutro Abs: 6.1 10*3/uL (ref 1.7–7.7)
Neutrophils Relative %: 79 %
Neutrophils Relative %: 73 %
Platelets: 329 10*3/uL (ref 150–400)
Platelets: 324 10*3/uL (ref 150–400)
RBC: 4.7 MIL/uL (ref 4.22–5.81)
RBC: 4.78 MIL/uL (ref 4.22–5.81)
RDW: 13.1 % (ref 11.5–15.5)
RDW: 12.7 % (ref 11.5–15.5)
WBC: 8.1 10*3/uL (ref 4.0–10.5)
WBC: 8.4 10*3/uL (ref 4.0–10.5)
nRBC: 0 % (ref 0.0–0.2)
nRBC: 0 % (ref 0.0–0.2)

## 2021-06-30 LAB — BLOOD GAS, VENOUS
Acid-base deficit: 8.7 mmol/L — ABNORMAL HIGH (ref 0.0–2.0)
Bicarbonate: 16.7 mmol/L — ABNORMAL LOW (ref 20.0–28.0)
Drawn by: 6246
FIO2: 21 %
O2 Saturation: 83.8 %
Patient temperature: 36.5
pCO2, Ven: 33 mmHg — ABNORMAL LOW (ref 44–60)
pH, Ven: 7.31 (ref 7.25–7.43)
pO2, Ven: 49 mmHg — ABNORMAL HIGH (ref 32–45)

## 2021-06-30 LAB — URINALYSIS, ROUTINE W REFLEX MICROSCOPIC
Bacteria, UA: NONE SEEN
Bilirubin Urine: NEGATIVE
Glucose, UA: >=500 mg/dL — AB
Ketones, ur: 20 mg/dL — AB
Leukocytes,Ua: NEGATIVE
Nitrite: NEGATIVE
Protein, ur: NEGATIVE mg/dL
Specific Gravity, Urine: 1.021 (ref 1.005–1.030)
pH: 6 (ref 5.0–8.0)

## 2021-06-30 LAB — COMPREHENSIVE METABOLIC PANEL
ALT: 16 U/L (ref 0–44)
AST: 13 U/L — ABNORMAL LOW (ref 15–41)
Albumin: 4.1 g/dL (ref 3.5–5.0)
Alkaline Phosphatase: 80 U/L (ref 38–126)
Anion gap: 20 — ABNORMAL HIGH (ref 5–15)
BUN: 50 mg/dL — ABNORMAL HIGH (ref 6–20)
CO2: 16 mmol/L — ABNORMAL LOW (ref 22–32)
Calcium: 9.5 mg/dL (ref 8.9–10.3)
Chloride: 86 mmol/L — ABNORMAL LOW (ref 98–111)
Creatinine, Ser: 1.95 mg/dL — ABNORMAL HIGH (ref 0.61–1.24)
GFR, Estimated: 47 mL/min — ABNORMAL LOW (ref >60)
Glucose, Bld: >1200 mg/dL (ref 70–99)
Potassium: 6 mmol/L — ABNORMAL HIGH (ref 3.5–5.1)
Sodium: 122 mmol/L — ABNORMAL LOW (ref 135–145)
Total Bilirubin: 2.4 mg/dL — ABNORMAL HIGH (ref 0.3–1.2)
Total Protein: 7.2 g/dL (ref 6.5–8.1)

## 2021-06-30 LAB — BASIC METABOLIC PANEL
Anion gap: 17 — ABNORMAL HIGH (ref 5–15)
BUN: 49 mg/dL — ABNORMAL HIGH (ref 6–20)
CO2: 20 mmol/L — ABNORMAL LOW (ref 22–32)
Calcium: 9.6 mg/dL (ref 8.9–10.3)
Chloride: 87 mmol/L — ABNORMAL LOW (ref 98–111)
Creatinine, Ser: 1.89 mg/dL — ABNORMAL HIGH (ref 0.61–1.24)
GFR, Estimated: 49 mL/min — ABNORMAL LOW (ref >60)
Glucose, Bld: 969 mg/dL (ref 70–99)
Potassium: 5.4 mmol/L — ABNORMAL HIGH (ref 3.5–5.1)
Sodium: 124 mmol/L — ABNORMAL LOW (ref 135–145)

## 2021-06-30 LAB — ETHANOL: Alcohol, Ethyl (B): <10 mg/dL (ref <10)

## 2021-06-30 LAB — CBG MONITORING, ED
Glucose-Capillary: >600 mg/dL (ref 70–99)
Glucose-Capillary: >600 mg/dL (ref 70–99)
Glucose-Capillary: >600 mg/dL (ref 70–99)
Glucose-Capillary: 551 mg/dL (ref 70–99)

## 2021-06-30 LAB — BETA-HYDROXYBUTYRIC ACID: Beta-Hydroxybutyric Acid: 4.03 mmol/L — ABNORMAL HIGH (ref 0.05–0.27)

## 2021-06-30 LAB — GLUCOSE, CAPILLARY: Glucose-Capillary: 482 mg/dL — ABNORMAL HIGH (ref 70–99)

## 2021-06-30 MED ORDER — DEXTROSE IN LACTATED RINGERS 5 % IV SOLN: INTRAVENOUS | Status: DC

## 2021-06-30 MED ORDER — INSULIN REGULAR(HUMAN) IN NACL 100-0.9 UT/100ML-% IV SOLN
INTRAVENOUS | Status: DC
  Administered 2021-06-30: 6.5 [IU]/h via INTRAVENOUS
  Filled 2021-06-30: qty 100

## 2021-06-30 MED ORDER — ONDANSETRON HCL 4 MG/2ML IJ SOLN
4.0000 mg | Freq: Four times a day (QID) | INTRAMUSCULAR | Status: DC | PRN
Start: 2021-06-30 — End: 2021-07-01
  Administered 2021-06-30: 4 mg via INTRAVENOUS
  Filled 2021-06-30: qty 2

## 2021-06-30 MED ORDER — DEXTROSE 50 % IV SOLN: 0.0000 mL | INTRAVENOUS | Status: DC | PRN

## 2021-06-30 MED ORDER — LACTATED RINGERS IV SOLN: INTRAVENOUS | Status: DC

## 2021-06-30 MED ORDER — CHLORHEXIDINE GLUCONATE CLOTH 2 % EX PADS: 6.0000 | MEDICATED_PAD | Freq: Every day | CUTANEOUS | Status: DC

## 2021-06-30 MED ORDER — LACTATED RINGERS IV BOLUS
20.0000 mL/kg | Freq: Once | INTRAVENOUS | Status: AC
  Administered 2021-06-30: 1270 mL via INTRAVENOUS

## 2021-06-30 NOTE — ED Triage Notes (Signed)
Pt reports n/v with hx of dm1.  Bgl >600 in triage.  Resp even and unlabored.  Skin wamr and dry.   ?

## 2021-06-30 NOTE — ED Provider Notes (Signed)
?[place] ?Provider Note ? ? ?CSN: [id number] ?Arrival date & time: 06/30/21  1914 ? ?  ? ?History ? ?Chief Complaint  ?Patient presents with  ? Hyperglycemia  ? ? ?OLUWATOMISIN DEMAN is a 26 y.o. male. ? ?HPI ?Patient presents with concern of nausea, generalized and hyperglycemia.  He acknowledges that he may have missed a dose of his insulin, but otherwise has been taking his medication as directed.  Symptoms began over the past day or so with nausea, vomiting, no abdominal pain, no chest pain no relief with anything, and inability to take anything in attempt to calm symptoms as well. ?  ? ?Home Medications ?Prior to Admission medications   ?Medication Sig Start Date End Date Taking? Authorizing Provider  ?azithromycin (ZITHROMAX) 250 MG tablet Take 2 tablets (500 mg) on  Day 1,  followed by 1 tablet (250 mg) once daily on Days 2 through 5. 06/29/21  Yes [provider]  ?gabapentin (NEURONTIN) 300 MG capsule Take 300 mg by mouth 3 (three) times daily. 10/09/20  Yes [provider]  ?insulin NPH-regular Human (70-30) 100 UNIT/ML injection Inject 40 Units into the skin 2 (two) times daily.   Yes [provider]  ?ondansetron (ZOFRAN-ODT) 8 MG disintegrating tablet Take by mouth. 06/29/21 07/06/21 Yes [provider]  ?ibuprofen (MOTRIN IB) 200 MG tablet Take 2 tablets (400 mg total) by mouth every 6 (six) hours as needed for moderate pain. ?Patient not taking: Reported on 03/05/2021 12/22/20 12/22/21  Antonieta Pert, MD  ?lisinopril (ZESTRIL) 10 MG tablet Take 10 mg by mouth daily.    [provider]  ?lisinopril (ZESTRIL) 10 MG tablet Take 1 tablet by mouth daily.    [provider]  ?risperiDONE (RISPERDAL) 3 MG tablet Take 3 mg by mouth daily. 12/02/20   [provider]  ?SYMBICORT 160-4.5 MCG/ACT inhaler Inhale 2 puffs into the lungs 2 (two) times daily. ?Patient not taking: Reported on 03/05/2021 11/30/20   [provider]  ?    ? ?Allergies    ?Sulfa antibiotics   ? ?Review of Systems   ?Review of Systems  ?Constitutional:   ?     Per HPI, otherwise negative  ?HENT:    ?     Per HPI, otherwise negative  ?Respiratory:    ?     Per HPI, otherwise negative  ?Cardiovascular:   ?     Per HPI, otherwise negative  ?Gastrointestinal:  Positive for nausea and vomiting. Negative for abdominal pain.  ?Endocrine:  ?     Negative aside from HPI  ?Genitourinary:   ?     Neg aside from HPI   ?Musculoskeletal:   ?     Per HPI, otherwise negative  ?Skin: Negative.   ?Neurological:  Positive for weakness. Negative for syncope.  ? ?Physical Exam ?Updated Vital Signs ?BP (!) 157/101   Pulse (!) 110   Temp 97.8 ?F (36.6 ?C)   Resp (!) 22   Ht '5\' 11"'$  (1.803 m)   Wt 63.5 kg   SpO2 98%   BMI 19.53 kg/m?  ?Physical Exam ?Vitals and nursing note reviewed.  ?Constitutional:   ?   Appearance: He is well-developed. He is ill-appearing.  ?HENT:  ?   Head: Normocephalic and atraumatic.  ?Eyes:  ?   Conjunctiva/sclera: Conjunctivae normal.  ?Cardiovascular:  ?   Rate and Rhythm: Regular rhythm. Tachycardia present.  ?Pulmonary:  ?   Effort: Pulmonary effort is normal. No respiratory distress.  ?  Breath sounds: No stridor.  ?Abdominal:  ?   General: There is no distension.  ?   Tenderness: There is no abdominal tenderness. There is no guarding.  ?Skin: ?   General: Skin is warm and dry.  ?Neurological:  ?   Mental Status: He is alert and oriented to person, place, and time.  ? ? ?ED Results / Procedures / Treatments   ?Labs ?(all labs ordered are listed, but only abnormal results are displayed) ?Labs Reviewed  ?COMPREHENSIVE METABOLIC PANEL - Abnormal; Notable for the following components:  ?    Result Value  ? Sodium 122 (*)   ? Potassium 6.0 (*)   ? Chloride 86 (*)   ? CO2 16 (*)   ? Glucose, Bld >1,200 (*)   ? BUN 50 (*)   ? Creatinine, Ser 1.95 (*)   ? AST 13 (*)   ? Total Bilirubin 2.4 (*)   ? GFR, Estimated 47 (*)   ? Anion gap 20 (*)   ? All other  components within normal limits  ?BLOOD GAS, VENOUS - Abnormal; Notable for the following components:  ? pCO2, Ven 33 (*)   ? pO2, Ven 49 (*)   ? Bicarbonate 16.7 (*)   ? Acid-base deficit 8.7 (*)   ? All other components within normal limits  ?CBG MONITORING, ED - Abnormal; Notable for the following components:  ? Glucose-Capillary >600 (*)   ? All other components within normal limits  ?ETHANOL  ?CBC WITH DIFFERENTIAL/PLATELET  ?URINALYSIS, ROUTINE W REFLEX MICROSCOPIC  ?BASIC METABOLIC PANEL  ?BASIC METABOLIC PANEL  ?BASIC METABOLIC PANEL  ?BASIC METABOLIC PANEL  ?OSMOLALITY  ?CBC WITH DIFFERENTIAL/PLATELET  ?URINALYSIS, ROUTINE W REFLEX MICROSCOPIC  ?BETA-HYDROXYBUTYRIC ACID  ?BETA-HYDROXYBUTYRIC ACID  ?CBG MONITORING, ED  ? ? ?EKG ?None ? ?Radiology ?No results found. ? ?Procedures ?Procedures  ? ? ?Medications Ordered in ED ?Medications  ?lactated ringers bolus 1,270 mL (has no administration in time range)  ?insulin regular, human (MYXREDLIN) 100 units/ 100 mL infusion (has no administration in time range)  ?lactated ringers infusion (has no administration in time range)  ?dextrose 5 % in lactated ringers infusion (has no administration in time range)  ?dextrose 50 % solution 0-50 mL (has no administration in time range)  ? ? ?ED Course/ Medical Decision Making/ A&P ?This patient with a Hx of insulin-dependent diabetes mellitus presents to the ED for concern of nausea, vomiting, hyperglycemia, this involves an extensive number of treatment options, and is a complaint that carries with it a high risk of complications and morbidity.   ? ?The differential diagnosis includes DKA, nonketotic hyperosmolar state, dehydration, bacteremia, sepsis ? ? ?Social Determinants of Health: ? ?Multiple medical problems ? ? ?After the initial evaluation, orders, including: Fluids labs were initiated. ? ? ?Patient placed on Cardiac and Pulse-Oximetry Monitors. ?The patient was maintained on a cardiac monitor.  The cardiac  monitored showed an rhythm of 110 sinus tach abnormal ?The patient was also maintained on pulse oximetry. The readings were typically 99% room air normal ? ? ?On repeat evaluation of the patient stayed the same ? ?Lab Tests: ? ?I personally interpreted labs.  The pertinent results include: Critically abnormal glucose values greater than 1200, beyond our measuring capacity.  Patient was hyperkalemia, AKI, anion gap 20, all concerning for nonketotic hyperosmolar state versus DKA.  Beta hydroxybutyrate pending on admission ? ? ? ?Consultations Obtained: ? ?I requested consultation with the internal medicine,  and discussed lab and imaging findings as well  as pertinent plan - they recommend: Admission ? ?Dispostion / Final MDM: ? ?After consideration of the diagnostic results and the patient's response to treatment, patient required initiation of continuous insulin, fluids, monitoring, stepdown level of care given concern for DKA versus nonketotic hyperosmolar state.  Patient is clinically very dehydrated, has evidence for acute kidney injury as well.  No urine thus far.  As above, patient had initiation of stabilization meds, required admission to the stepdown unit. ? ?Final Clinical Impression(s) / ED Diagnoses ?Final diagnoses:  ?Diabetic ketoacidosis without coma associated with type 1 diabetes mellitus ([place])  ?AKI (acute kidney injury) ([place])  ? ?CRITICAL CARE ?Performed by: Carmin Muskrat ?Total critical care time: 35 minutes ?Critical care time was exclusive of separately billable procedures and treating other patients. ?Critical care was necessary to treat or prevent imminent or life-threatening deterioration. ?Critical care was time spent personally by me on the following activities: development of treatment plan with patient and/or surrogate as well as nursing, discussions with consultants, evaluation of patient's response to treatment, examination of patient, obtaining history from patient or surrogate,  ordering and performing treatments and interventions, ordering and review of laboratory studies, ordering and review of radiographic studies, pulse oximetry and re-evaluation of patient's condition. ? ?  ?Carmin Muskrat, MD ?0

## 2021-07-01 DIAGNOSIS — E1010 Type 1 diabetes mellitus with ketoacidosis without coma: Principal | ICD-10-CM

## 2021-07-01 DIAGNOSIS — Z720 Tobacco use: Secondary | ICD-10-CM

## 2021-07-01 DIAGNOSIS — E871 Hypo-osmolality and hyponatremia: Secondary | ICD-10-CM

## 2021-07-01 DIAGNOSIS — E860 Dehydration: Secondary | ICD-10-CM

## 2021-07-01 DIAGNOSIS — N179 Acute kidney failure, unspecified: Secondary | ICD-10-CM

## 2021-07-01 DIAGNOSIS — E875 Hyperkalemia: Secondary | ICD-10-CM

## 2021-07-01 LAB — GLUCOSE, CAPILLARY
Glucose-Capillary: 104 mg/dL — ABNORMAL HIGH (ref 70–99)
Glucose-Capillary: 117 mg/dL — ABNORMAL HIGH (ref 70–99)
Glucose-Capillary: 120 mg/dL — ABNORMAL HIGH (ref 70–99)
Glucose-Capillary: 123 mg/dL — ABNORMAL HIGH (ref 70–99)
Glucose-Capillary: 153 mg/dL — ABNORMAL HIGH (ref 70–99)
Glucose-Capillary: 172 mg/dL — ABNORMAL HIGH (ref 70–99)
Glucose-Capillary: 178 mg/dL — ABNORMAL HIGH (ref 70–99)
Glucose-Capillary: 208 mg/dL — ABNORMAL HIGH (ref 70–99)
Glucose-Capillary: 337 mg/dL — ABNORMAL HIGH (ref 70–99)
Glucose-Capillary: 377 mg/dL — ABNORMAL HIGH (ref 70–99)

## 2021-07-01 LAB — BASIC METABOLIC PANEL
Anion gap: 9 (ref 5–15)
Anion gap: 8 (ref 5–15)
BUN: 39 mg/dL — ABNORMAL HIGH (ref 6–20)
BUN: 36 mg/dL — ABNORMAL HIGH (ref 6–20)
CO2: 28 mmol/L (ref 22–32)
CO2: 28 mmol/L (ref 22–32)
Calcium: 9.7 mg/dL (ref 8.9–10.3)
Calcium: 9.5 mg/dL (ref 8.9–10.3)
Chloride: 98 mmol/L (ref 98–111)
Chloride: 103 mmol/L (ref 98–111)
Creatinine, Ser: 1.37 mg/dL — ABNORMAL HIGH (ref 0.61–1.24)
Creatinine, Ser: 1.3 mg/dL — ABNORMAL HIGH (ref 0.61–1.24)
GFR, Estimated: >60 mL/min (ref >60)
GFR, Estimated: >60 mL/min (ref >60)
Glucose, Bld: 300 mg/dL — ABNORMAL HIGH (ref 70–99)
Glucose, Bld: 108 mg/dL — ABNORMAL HIGH (ref 70–99)
Potassium: 3.9 mmol/L (ref 3.5–5.1)
Potassium: 3.5 mmol/L (ref 3.5–5.1)
Sodium: 135 mmol/L (ref 135–145)
Sodium: 139 mmol/L (ref 135–145)

## 2021-07-01 LAB — RAPID URINE DRUG SCREEN, HOSP PERFORMED
Amphetamines: NOT DETECTED
Barbiturates: NOT DETECTED
Benzodiazepines: POSITIVE — AB
Cocaine: POSITIVE — AB
Opiates: NOT DETECTED
Tetrahydrocannabinol: POSITIVE — AB

## 2021-07-01 LAB — BETA-HYDROXYBUTYRIC ACID: Beta-Hydroxybutyric Acid: 0.82 mmol/L — ABNORMAL HIGH (ref 0.05–0.27)

## 2021-07-01 LAB — MRSA NEXT GEN BY PCR, NASAL: MRSA by PCR Next Gen: NOT DETECTED

## 2021-07-01 LAB — OSMOLALITY: Osmolality: 333 mOsm/kg (ref 275–295)

## 2021-07-01 MED ORDER — CHLORHEXIDINE GLUCONATE CLOTH 2 % EX PADS
6.0000 | MEDICATED_PAD | Freq: Every day | CUTANEOUS | Status: DC
  Administered 2021-07-01: 6 via TOPICAL

## 2021-07-01 MED ORDER — HEPARIN SODIUM (PORCINE) 5000 UNIT/ML IJ SOLN
5000.0000 [IU] | Freq: Three times a day (TID) | INTRAMUSCULAR | Status: DC
  Administered 2021-07-01: 5000 [IU] via SUBCUTANEOUS
  Filled 2021-07-01: qty 1

## 2021-07-01 MED ORDER — INSULIN ASPART 100 UNIT/ML IJ SOLN
4.0000 [IU] | Freq: Three times a day (TID) | INTRAMUSCULAR | Status: DC
  Administered 2021-07-01: 4 [IU] via SUBCUTANEOUS

## 2021-07-01 MED ORDER — BLOOD GLUCOSE METER KIT: PACK | 0 refills | Status: DC

## 2021-07-01 MED ORDER — RISPERIDONE 1 MG PO TABS
3.0000 mg | ORAL_TABLET | Freq: Every day | ORAL | Status: DC
  Administered 2021-07-01: 3 mg via ORAL
  Filled 2021-07-01: qty 3

## 2021-07-01 MED ORDER — GABAPENTIN 300 MG PO CAPS
300.0000 mg | ORAL_CAPSULE | Freq: Three times a day (TID) | ORAL | Status: DC
  Administered 2021-07-01: 300 mg via ORAL
  Filled 2021-07-01: qty 1

## 2021-07-01 MED ORDER — INSULIN GLARGINE-YFGN 100 UNIT/ML ~~LOC~~ SOLN
12.0000 [IU] | SUBCUTANEOUS | Status: DC
  Administered 2021-07-01: 12 [IU] via SUBCUTANEOUS
  Filled 2021-07-01 (×2): qty 0.12

## 2021-07-01 MED ORDER — INSULIN ASPART 100 UNIT/ML IJ SOLN: 0.0000 [IU] | Freq: Every day | INTRAMUSCULAR | Status: DC

## 2021-07-01 MED ORDER — GABAPENTIN 300 MG PO CAPS: 300.0000 mg | ORAL_CAPSULE | Freq: Every day | ORAL | Status: DC

## 2021-07-01 MED ORDER — LISINOPRIL 10 MG PO TABS
10.0000 mg | ORAL_TABLET | Freq: Every day | ORAL | Status: DC
  Administered 2021-07-01: 10 mg via ORAL
  Filled 2021-07-01: qty 1

## 2021-07-01 MED ORDER — NICOTINE 21 MG/24HR TD PT24
21.0000 mg | MEDICATED_PATCH | Freq: Every day | TRANSDERMAL | Status: DC
  Administered 2021-07-01: 21 mg via TRANSDERMAL
  Filled 2021-07-01: qty 1

## 2021-07-01 MED ORDER — INSULIN NPH ISOPHANE & REGULAR (70-30) 100 UNIT/ML ~~LOC~~ SUSP: 12.0000 [IU] | Freq: Two times a day (BID) | SUBCUTANEOUS | 11 refills | Status: DC

## 2021-07-01 MED ORDER — INSULIN ASPART 100 UNIT/ML IJ SOLN: 0.0000 [IU] | Freq: Three times a day (TID) | INTRAMUSCULAR | Status: DC

## 2021-07-01 MED ORDER — DEXTROSE 50 % IV SOLN: 0.0000 mL | INTRAVENOUS | Status: DC | PRN

## 2021-07-01 MED ORDER — LACTATED RINGERS IV SOLN: INTRAVENOUS | Status: DC

## 2021-07-01 MED ORDER — MOMETASONE FURO-FORMOTEROL FUM 200-5 MCG/ACT IN AERO
2.0000 | INHALATION_SPRAY | Freq: Two times a day (BID) | RESPIRATORY_TRACT | Status: DC
  Filled 2021-07-01: qty 8.8

## 2021-07-01 NOTE — Discharge Summary (Signed)
Physician Discharge Summary  ?Pedro [name]:[id number] DOB: 30-Apr-1995 DOA: 06/30/2021 ? ?PCP: Neale Burly, MD ? ?Admit date: 06/30/2021 ?Discharge date: 07/01/2021 ? ?Admitted From:  Home  ?Disposition: Home  ? ?Recommendations for Outpatient Follow-up:  ?Follow up with PCP in 1 weeks ?Please stop all tobacco use ?Please check blood glucose 5 times daily and PRN if not feeling well ? ?Discharge Condition: STABLE   ?CODE STATUS: FULL ?DIET: Carb modified/heart healthy   ? ?Brief Hospitalization Summary: ?Please see all hospital notes, images, labs for full details of the hospitalization. ?26 y.o. male with medical history significant of with history of type 1 diabetes mellitus, history of tobacco use disorder, history of substance abuse, presents ED with a chief complaint of not feeling well.  Patient is overtly unweighed at each question.  He reports that he started having nausea and vomiting the day prior to presentation to the ED.  He reports 4 episodes of emesis that are nonbloody.  Patient reports that his last normal bowel movement was the day prior to presentation.  His last normal meal was approximately 2 days ago.  Patient denies abdominal pain.  He reports no fatigue, fever, dysuria.  He does report dyspnea.  He reports the dyspnea can be worse when he was ambulating or when he is laying down.  He does not notice any dyspnea at the time of the exam. ?  ?Patient is a current smoker.  He denies alcohol use and no illicit drug use.  He is not vaccinated for COVID.  Patient is full code. ? ?HOSPITAL COURSE BY PROBLEM LIST  ? ?Assessment and Plan: ?* DKA (diabetic ketoacidosis) (HCC) ?- pH 7.3, bicarb 16, glucose greater than 1200, gap 17 on ADMISSION ?-Type I diabetic ?-Likely due to nonadherence to medical regimen ?-Reports only missing 1 day of insulin, but that does not seem likely with the degree of DKA ?-RESOLVED NOW ?-PT IS ADAMANT THAT HE HAS INSULIN AND DOES NOT NEED RXS AND BUYS INSULIN OVER  THE COUNTER  ?- I DID RX METER/TESTING SUPPLIES ?- HE IS EATING AND TOLERATING FOOD WELL NOW. NO EMESIS ?-KETOACIDOSIS HAS RESOLVED.  ?-DC HOME  ? ?Hyponatremia ?-prerenal, treated with IV fluids and IV insulin and now resolved ? ?AKI (acute kidney injury) ([place]) ?-Creatinine 1.95, up from 0.99 ?-TREATED WITH IV FLUID HYDRATION  ? ?Dehydration ?- Patient was initially given 1 L in the ED, and then 1.2 L per Endo tool fluid bolus, continue IV infusion ?-Initial BUN 50, creatinine 1.95 continue to monitor ?-Dehydration secondary to DKA ?-TREATED AND RESOLVED ? ?Tobacco abuse ?- Current smoker ?-Nicotine patch ordered ?-Counseled on importance of cessation ?-Continue to monitor ?-strongly advised to stop all tobacco products and gave written information ? ?Hyperkalemia ?- Initial potassium 6.0 due to ketoacidosis - RESOLVED NOW  ? ?Discharge Diagnoses:  ?Principal Problem: ?  DKA (diabetic ketoacidosis) ([place]) ?Active Problems: ?  Hyperkalemia ?  Tobacco abuse ?  Dehydration ?  AKI (acute kidney injury) ([place]) ?  Hyponatremia ? ? ?Discharge Instructions: ? ?Allergies as of 07/01/2021   ? ?   Reactions  ? Sulfa Antibiotics Hives, Other (See Comments)  ? Reaction:  Fever  ? ?  ? ?  ?Medication List  ?  ? ?STOP taking these medications   ? ?azithromycin 250 MG tablet ?Commonly known as: ZITHROMAX ?  ?ibuprofen 200 MG tablet ?Commonly known as: Motrin IB ?  ? ?  ? ?TAKE these medications   ? ?blood glucose meter kit  and supplies ?Dispense based on patient and insurance preference. Use up to four times daily as directed. (FOR ICD-10 E10.9, E11.9). ?  ?gabapentin 300 MG capsule ?Commonly known as: NEURONTIN ?Take 1 capsule (300 mg total) by mouth at bedtime. ?What changed: when to take this ?  ?insulin NPH-regular Human (70-30) 100 UNIT/ML injection ?Inject 12 Units into the skin 2 (two) times daily with a meal. ?What changed:  ?how much to take ?when to take this ?  ?lisinopril 10 MG tablet ?Commonly known as: ZESTRIL ?Take 10  mg by mouth daily. ?  ?ondansetron 8 MG disintegrating tablet ?Commonly known as: ZOFRAN-ODT ?Take 8 mg by mouth every 8 (eight) hours as needed for nausea or vomiting. ?  ?risperiDONE 3 MG tablet ?Commonly known as: RISPERDAL ?Take 3 mg by mouth daily. ?  ?Symbicort 160-4.5 MCG/ACT inhaler ?Generic drug: budesonide-formoterol ?Inhale 2 puffs into the lungs 2 (two) times daily. ?  ? ?  ? ? Follow-up Information   ? ? Neale Burly, MD. Schedule an appointment as soon as possible for a visit in 1 week(s).   ?Specialty: Internal Medicine ?Why: Hospital Follow Up ?Contact information: ?[place] ?[place] [address] ?[phone] ? ? ?  ?  ? ?  ?  ? ?  ? ?Allergies  ?Allergen Reactions  ? Sulfa Antibiotics Hives and Other (See Comments)  ?  Reaction:  Fever ?  ? ?Allergies as of 07/01/2021   ? ?   Reactions  ? Sulfa Antibiotics Hives, Other (See Comments)  ? Reaction:  Fever  ? ?  ? ?  ?Medication List  ?  ? ?STOP taking these medications   ? ?azithromycin 250 MG tablet ?Commonly known as: ZITHROMAX ?  ?ibuprofen 200 MG tablet ?Commonly known as: Motrin IB ?  ? ?  ? ?TAKE these medications   ? ?blood glucose meter kit and supplies ?Dispense based on patient and insurance preference. Use up to four times daily as directed. (FOR ICD-10 E10.9, E11.9). ?  ?gabapentin 300 MG capsule ?Commonly known as: NEURONTIN ?Take 1 capsule (300 mg total) by mouth at bedtime. ?What changed: when to take this ?  ?insulin NPH-regular Human (70-30) 100 UNIT/ML injection ?Inject 12 Units into the skin 2 (two) times daily with a meal. ?What changed:  ?how much to take ?when to take this ?  ?lisinopril 10 MG tablet ?Commonly known as: ZESTRIL ?Take 10 mg by mouth daily. ?  ?ondansetron 8 MG disintegrating tablet ?Commonly known as: ZOFRAN-ODT ?Take 8 mg by mouth every 8 (eight) hours as needed for nausea or vomiting. ?  ?risperiDONE 3 MG tablet ?Commonly known as: RISPERDAL ?Take 3 mg by mouth daily. ?  ?Symbicort 160-4.5 MCG/ACT  inhaler ?Generic drug: budesonide-formoterol ?Inhale 2 puffs into the lungs 2 (two) times daily. ?  ? ?  ? ?Procedures/Studies: ?No results found. ?  ?Subjective: ?Pt without complaints, says he has insulin and all supplies at home, buys over the counter.   ? ?Discharge Exam: ?Vitals:  ? 07/01/21 1000 07/01/21 1115  ?BP: 134/73   ?Pulse: 97   ?Resp: 13   ?Temp:  98.6 ?F (37 ?C)  ?SpO2: 96%   ? ?Vitals:  ? 07/01/21 0800 07/01/21 0900 07/01/21 1000 07/01/21 1115  ?BP: 124/71 (!) 144/87 134/73   ?Pulse: 90 84 97   ?Resp: _0 ?Temp:    98.6 ?F (37 ?C)  ?TempSrc:    Oral  ?SpO2: 98% 99% 96%   ?  Weight:      ?Height:      ? ?General: Pt is alert, awake, not in acute distress ?Cardiovascular: normal S1/S2 +, no rubs, no gallops ?Respiratory: CTA bilaterally, no wheezing, no rhonchi ?Abdominal: Soft, NT, ND, bowel sounds + ?Extremities: no edema, no cyanosis ?  ?The results of significant diagnostics from this hospitalization (including imaging, microbiology, ancillary and laboratory) are listed below for reference.   ? ? ?Microbiology: ?Recent Results (from the past 240 hour(s))  ?MRSA Next Gen by PCR, Nasal     Status: None  ? Collection Time: 06/30/21 11:37 PM  ? Specimen: Nasal Mucosa; Nasal Swab  ?Result Value Ref Range Status  ? MRSA by PCR Next Gen NOT DETECTED NOT DETECTED Final  ?  Comment: (NOTE) ?The GeneXpert MRSA Assay (FDA approved for NASAL specimens only), ?is one component of a comprehensive MRSA colonization surveillance ?program. It is not intended to diagnose MRSA infection nor to guide ?or monitor treatment for MRSA infections. ?Test performance is not FDA approved in patients less than 2 years ?old. ?Performed at [facility], [address] [place], [place] [address] ?  ?  ? ?Labs: ?BNP (last 3 results) ?No results for input(s): BNP in the last 8760 hours. ?Basic Metabolic Panel: ?Recent Labs  ?Lab 06/30/21 ?1959 06/30/21 ?2122 07/01/21 ?0137 07/01/21 ?2780  ?NA 122* 124* 135 139  ?K 6.0* 5.4*  3.9 3.5  ?CL 86* 87* 98 103  ?CO2 16* 20* 28 28  ?GLUCOSE >1,200* 969* 300* 108*  ?BUN 50* 49* 39* 36*  ?CREATININE 1.95* 1.89* 1.37* 1.30*  ?CALCIUM 9.5 9.6 9.7 9.5  ? ?Liver Function Tests: ?Recent Labs

## 2021-07-01 NOTE — Assessment & Plan Note (Addendum)
-   Initial potassium 6.0 due to ketoacidosis - RESOLVED NOW  ?

## 2021-07-01 NOTE — Assessment & Plan Note (Addendum)
-   Patient was initially given 1 L in the ED, and then 1.2 L per Endo tool fluid bolus, continue IV infusion ?-Initial BUN 50, creatinine 1.95 continue to monitor ?-Dehydration secondary to DKA ?-TREATED AND RESOLVED ?

## 2021-07-01 NOTE — Plan of Care (Signed)
?  Problem: Education: ?Goal: Knowledge of General Education information will improve ?Description: Including pain rating scale, medication(s)/side effects and non-pharmacologic comfort measures ?Outcome: Progressing ?  ?Problem: Health Behavior/Discharge Planning: ?Goal: Ability to manage health-related needs will improve ?Outcome: Progressing ?  ?Problem: Clinical Measurements: ?Goal: Ability to maintain clinical measurements within normal limits will improve ?Outcome: Progressing ?Goal: Will remain free from infection ?Outcome: Progressing ?Goal: Diagnostic test results will improve ?Outcome: Progressing ?Goal: Respiratory complications will improve ?Outcome: Progressing ?Goal: Cardiovascular complication will be avoided ?Outcome: Progressing ?  ?Problem: Activity: ?Goal: Risk for activity intolerance will decrease ?Outcome: Progressing ?  ?Problem: Nutrition: ?Goal: Adequate nutrition will be maintained ?Outcome: Progressing ?  ?Problem: Coping: ?Goal: Level of anxiety will decrease ?Outcome: Progressing ?  ?Problem: Elimination: ?Goal: Will not experience complications related to bowel motility ?Outcome: Progressing ?Goal: Will not experience complications related to urinary retention ?Outcome: Progressing ?  ?Problem: Pain Managment: ?Goal: General experience of comfort will improve ?Outcome: Progressing ?  ?Problem: Safety: ?Goal: Ability to remain free from injury will improve ?Outcome: Progressing ?  ?Problem: Skin Integrity: ?Goal: Risk for impaired skin integrity will decrease ?Outcome: Progressing ?  ?Problem: Education: ?Goal: Ability to describe self-care measures that may prevent or decrease complications (Diabetes Survival Skills Education) will improve ?Outcome: Progressing ?Goal: Individualized Educational Video(s) ?Outcome: Progressing ?  ?Problem: Health Behavior/Discharge Planning: ?Goal: Ability to identify and utilize available resources and services will improve ?Outcome: Progressing ?Goal:  Ability to manage health-related needs will improve ?Outcome: Progressing ?  ?Problem: Fluid Volume: ?Goal: Ability to achieve a balanced intake and output will improve ?Outcome: Progressing ?  ?Problem: Metabolic: ?Goal: Ability to maintain appropriate glucose levels will improve ?Outcome: Progressing ?  ?

## 2021-07-01 NOTE — Assessment & Plan Note (Addendum)
-  prerenal, treated with IV fluids and IV insulin and now resolved ?

## 2021-07-01 NOTE — Progress Notes (Signed)
Date and time results received: 07/01/21 0825 ?(use smartphrase ".now" to insert current time) ? ?Test: Serum Osmolality ?Critical Value: 333 ? ?Name of Provider Notified: MD Wynetta Emery ? ?Orders Received? Or Actions Taken?:  No new ordered obtained at this time.  ?

## 2021-07-01 NOTE — Hospital Course (Addendum)
26 y.o. male with medical history significant of with history of type 1 diabetes mellitus, history of tobacco use disorder, history of substance abuse, presents ED with a chief complaint of not feeling well.  Patient is overtly unweighed at each question.  He reports that he started having nausea and vomiting the day prior to presentation to the ED.  He reports 4 episodes of emesis that are nonbloody.  Patient reports that his last normal bowel movement was the day prior to presentation.  His last normal meal was approximately 2 days ago.  Patient denies abdominal pain.  He reports no fatigue, fever, dysuria.  He does report dyspnea.  He reports the dyspnea can be worse when he was ambulating or when he is laying down.  He does not notice any dyspnea at the time of the exam. ?  ?Patient is a current smoker.  He denies alcohol use and no illicit drug use.  He is not vaccinated for COVID.  Patient is full code. ?

## 2021-07-01 NOTE — Assessment & Plan Note (Addendum)
-  Creatinine 1.95, up from 0.99 ?-TREATED WITH IV FLUID HYDRATION  ?

## 2021-07-01 NOTE — H&P (Signed)
?History and Physical  ? ? ?Patient: Pedro [name] HWE:[id number] DOB: 1995-07-30 ?DOA: 06/30/2021 ?DOS: the patient was seen and examined on 07/01/2021 ?PCP: Neale Burly, MD  ?Patient coming from: Home ? ?Chief Complaint:  ?Chief Complaint  ?Patient presents with  ? Hyperglycemia  ? ?HPI: Pedro [name] is a 26 y.o. male with medical history significant of with history of type 1 diabetes mellitus, history of tobacco use disorder, history of substance abuse, presents ED with a chief complaint of not feeling well.  Patient is overtly unweighed at each question.  He reports that he started having nausea and vomiting the day prior to presentation to the ED.  He reports 4 episodes of emesis that are nonbloody.  Patient reports that his last normal bowel movement was the day prior to presentation.  His last normal meal was approximately 2 days ago.  Patient denies abdominal pain.  He reports no fatigue, fever, dysuria.  He does report dyspnea.  He reports the dyspnea can be worse when he was ambulating or when he is laying down.  He does not notice any dyspnea at the time of the exam. ? ?Patient is a current smoker.  He denies alcohol use and no illicit drug use.  He is not vaccinated for COVID.  Patient is full code. ?Review of Systems: As mentioned in the history of present illness. All other systems reviewed and are negative. ?Past Medical History:  ?Diagnosis Date  ? ADHD (attention deficit hyperactivity disorder)   ? DM type 1 (diabetes mellitus, type 1) ([place])   ? diagnosed age 15  ? Herpes   ? High cholesterol   ? Hypertension   ? Noncompliance with medications   ? ?Past Surgical History:  ?Procedure Laterality Date  ? INTERCOSTAL NERVE BLOCK Right 12/10/2020  ? Procedure: INTERCOSTAL NERVE BLOCK;  Surgeon: Lajuana Matte, MD;  Location: [facility];  Service: Thoracic;  Laterality: Right;  ? none    ? PLEURADESIS Right 12/10/2020  ? Procedure: MECHANICAL PLEURADESIS;  Surgeon: Lajuana Matte, MD;   Location: [facility];  Service: Thoracic;  Laterality: Right;  ? PLEURECTOMY Right 12/10/2020  ? Procedure: APICAL PLEURECTOMY;  Surgeon: Lajuana Matte, MD;  Location: [facility];  Service: Thoracic;  Laterality: Right;  ? ?Social History:  reports that he has been smoking cigarettes. He has a 5.00 pack-year smoking history. He has never used smokeless tobacco. He reports that he does not currently use drugs after having used the following drugs: Marijuana, Other-see comments, and Benzodiazepines. He reports that he does not drink alcohol. ? ?Allergies  ?Allergen Reactions  ? Sulfa Antibiotics Hives and Other (See Comments)  ?  Reaction:  Fever ?  ? ? ?Family History  ?Problem Relation Age of Onset  ? Diabetes Mellitus I Paternal Grandmother   ? Diabetes Mellitus I Paternal Aunt   ? Anxiety disorder Mother   ? Hypertension Mother   ? Hypertension Father   ? ? ?Prior to Admission medications   ?Medication Sig Start Date End Date Taking? Authorizing Provider  ?azithromycin (ZITHROMAX) 250 MG tablet Take 2 tablets (500 mg) on  Day 1,  followed by 1 tablet (250 mg) once daily on Days 2 through 5. 06/29/21  Yes [provider]  ?gabapentin (NEURONTIN) 300 MG capsule Take 300 mg by mouth 3 (three) times daily. 10/09/20  Yes [provider]  ?insulin NPH-regular Human (70-30) 100 UNIT/ML injection Inject 40 Units into the skin 2 (two) times daily.  Yes [provider]  ?ondansetron (ZOFRAN-ODT) 8 MG disintegrating tablet Take by mouth. 06/29/21 07/06/21 Yes [provider]  ?ibuprofen (MOTRIN IB) 200 MG tablet Take 2 tablets (400 mg total) by mouth every 6 (six) hours as needed for moderate pain. ?Patient not taking: Reported on 03/05/2021 12/22/20 12/22/21  Antonieta Pert, MD  ?lisinopril (ZESTRIL) 10 MG tablet Take 10 mg by mouth daily.    [provider]  ?lisinopril (ZESTRIL) 10 MG tablet Take 1 tablet by mouth daily.    [provider]  ?risperiDONE (RISPERDAL) 3 MG tablet Take  3 mg by mouth daily. 12/02/20   [provider]  ?SYMBICORT 160-4.5 MCG/ACT inhaler Inhale 2 puffs into the lungs 2 (two) times daily. ?Patient not taking: Reported on 03/05/2021 11/30/20   [provider]  ? ? ?Physical Exam: ?Vitals:  ? 07/01/21 0000 07/01/21 0100 07/01/21 0200 07/01/21 0300  ?BP: 138/86 111/61 131/86 127/74  ?Pulse: 100 (!) 103 (!) 105 (!) 101  ?Resp: (!) '21 19 18 16  '$ ?Temp:      ?TempSrc:      ?SpO2: 96% 95% 96% 96%  ?Weight:      ?Height:      ? ?1.  General: ?Patient lying supine in bed,  no acute distress ?  ?2. Psychiatric: ?Somnolent and/or attempting to ignore me and oriented x 3, mood and behavior normal for situation, pleasant and cooperative with exam ?  ?3. Neurologic: ?Speech and language are normal, face is symmetric, moves all 4 extremities voluntarily, at baseline without acute deficits on limited exam ?  ?4. HEENMT:  ?Head is atraumatic, normocephalic, pupils reactive to light, neck is supple, trachea is midline, mucous membranes are mildly dry ?  ?5. Respiratory : ?Lungs are clear to auscultation bilaterally without wheezing, rhonchi, rales, no cyanosis, no increase in work of breathing or accessory muscle use ?  ?6. Cardiovascular : ?Heart rate normal, rhythm is regular, no murmurs, rubs or gallops, no peripheral edema, peripheral pulses palpated ?  ?7. Gastrointestinal:  ?Abdomen is soft, nondistended, nontender to palpation bowel sounds active, no masses or organomegaly palpated ?  ?8. Skin:  ?Skin is warm, dry and intact without rashes, acute lesions, or ulcers on limited exam ?  ?9.Musculoskeletal:  ?No acute deformities or trauma, no asymmetry in tone, no peripheral edema, peripheral pulses palpated, no tenderness to palpation in the extremities ? ?Data Reviewed: ?In the ED ?Temp 97.8, heart rate 110, respiratory rate 22, blood pressure 157/101 ?No leukocytosis with a white blood cell count of 8.0, hemoglobin 14.0, platelets 329 ?Pseudohyponatremia with a  sodium of 122, corrects for glucose 1200 ?Elevated potassium 6.0-likely to shift back with continued treatment of hyperglycemia ?Gap 16, ?BUN 50, creatinine 1.95, ?Glucose greater than 1200 ?Admission requested for further management of DKA ?Assessment and Plan: ?* DKA (diabetic ketoacidosis) (HCC) ?- pH 7.3, bicarb 16, glucose greater than 1200, gap 17 ?-Type I diabetic ?-Likely due to nonadherence to medical regimen ?-Reports only missing 1 dose of insulin, but that does not seem likely with the degree of DKA ?-No other triggers in DKA identified ?-Patient's normal insulin regimen is 30 units twice daily of 70/30 ?-Continue insulin drip, continue every 4 hours BMP, continue every 8 hour beta hydroxybutyrate, continue Endo tool ? ?Hyponatremia ?- Corrects for hyperglycemia with initial sodium of 122 and sugar greater than 1200 ?-Continue to monitor on every 4 hours BMP ? ?AKI (acute kidney injury) ([place]) ?-Creatinine 1.95, up from 0.99 ?-Secondary to dehydration and DKA ?-  Continue treatments as above ?-Continue to monitor ? ?Dehydration ?- Patient was initially given 1 L in the ED, and then 1.2 L per Endo tool fluid bolus, continue IV infusion ?-Initial BUN 50, creatinine 1.95 continue to monitor ?-Dehydration secondary to DKA ? ?Tobacco abuse ?- Current smoker ?-Nicotine patch ordered ?-Counseled on importance of cessation ?-Continue to monitor ? ?Hyperkalemia ?- Initial potassium 6.0 ?-Likely to shift towards normal with insulin drip ?-Repeat potassium 3.9 ?-Continue insulin drip, continue hydration, continue to monitor ? ? ? ? ? Advance Care Planning:   Code Status: Full Code  ? ?Consults: None ? ?Family Communication: No family at bedside ? ?Severity of Illness: ?The appropriate patient status for this patient is INPATIENT. Inpatient status is judged to be reasonable and necessary in order to provide the required intensity of service to ensure the patient's safety. The patient's presenting symptoms, physical  exam findings, and initial radiographic and laboratory data in the context of their chronic comorbidities is felt to place them at high risk for further clinical deterioration. Furthermore, it is not anticipa

## 2021-07-01 NOTE — Assessment & Plan Note (Addendum)
-   pH 7.3, bicarb 16, glucose greater than 1200, gap 17 on ADMISSION ?-Type I diabetic ?-Likely due to nonadherence to medical regimen ?-Reports only missing 1 day of insulin, but that does not seem likely with the degree of DKA ?-RESOLVED NOW ?-PT IS ADAMANT THAT HE HAS INSULIN AND DOES NOT NEED RXS AND BUYS INSULIN OVER THE COUNTER  ?- I DID RX METER/TESTING SUPPLIES ?- HE IS EATING AND TOLERATING FOOD WELL NOW. NO EMESIS ?-KETOACIDOSIS HAS RESOLVED.  ?-DC HOME  ?

## 2021-07-01 NOTE — Assessment & Plan Note (Addendum)
-   Current smoker ?-Nicotine patch ordered ?-Counseled on importance of cessation ?-Continue to monitor ?-strongly advised to stop all tobacco products and gave written information ?

## 2021-07-01 NOTE — Discharge Instructions (Signed)
IMPORTANT INFORMATION: PAY CLOSE ATTENTION  ? ?PHYSICIAN DISCHARGE INSTRUCTIONS ? ?Follow with Primary care provider  Hasanaj, Xaje A, MD  and other consultants as instructed by your Hospitalist Physician ? ?SEEK MEDICAL CARE OR RETURN TO EMERGENCY ROOM IF SYMPTOMS COME BACK, WORSEN OR NEW PROBLEM DEVELOPS  ? ?Please note: ?You were cared for by a hospitalist during your hospital stay. Every effort will be made to forward records to your primary care provider.  You can request that your primary care provider send for your hospital records if they have not received them.  Once you are discharged, your primary care physician will handle any further medical issues. Please note that NO REFILLS for any discharge medications will be authorized once you are discharged, as it is imperative that you return to your primary care physician (or establish a relationship with a primary care physician if you do not have one) for your post hospital discharge needs so that they can reassess your need for medications and monitor your lab values. ? ?Please get a complete blood count and chemistry panel checked by your Primary MD at your next visit, and again as instructed by your Primary MD. ? ?Get Medicines reviewed and adjusted: ?Please take all your medications with you for your next visit with your Primary MD ? ?Laboratory/radiological data: ?Please request your Primary MD to go over all hospital tests and procedure/radiological results at the follow up, please ask your primary care provider to get all Hospital records sent to his/her office. ? ?In some cases, they will be blood work, cultures and biopsy results pending at the time of your discharge. Please request that your primary care provider follow up on these results. ? ?If you are diabetic, please bring your blood sugar readings with you to your follow up appointment with primary care.   ? ?Please call and make your follow up appointments as soon as possible.   ? ?Also Note  the following: ?If you experience worsening of your admission symptoms, develop shortness of breath, life threatening emergency, suicidal or homicidal thoughts you must seek medical attention immediately by calling 911 or calling your MD immediately  if symptoms less severe. ? ?You must read complete instructions/literature along with all the possible adverse reactions/side effects for all the Medicines you take and that have been prescribed to you. Take any new Medicines after you have completely understood and accpet all the possible adverse reactions/side effects.  ? ?Do not drive when taking Pain medications or sleeping medications (Benzodiazepines) ? ?Do not take more than prescribed Pain, Sleep and Anxiety Medications. It is not advisable to combine anxiety,sleep and pain medications without talking with your primary care practitioner ? ?Special Instructions: If you have smoked or chewed Tobacco  in the last 2 yrs please stop smoking, stop any regular Alcohol  and or any Recreational drug use. ? ?Wear Seat belts while driving.  Do not drive if taking any narcotic, mind altering or controlled substances or recreational drugs or alcohol.  ? ? ? ? ? ?

## 2021-07-01 NOTE — TOC Progression Note (Signed)
Transition of Care (TOC) - Progression Note  ? ? ?Patient Details  ?Name: Pedro [name] ?MRN: [id number] ?Date of Birth: 1995/10/12 ? ?Transition of Care (TOC) CM/SW Contact  ?Boneta Lucks, RN ?Phone Number: ?07/01/2021, 1:30 PM ? ?Clinical Narrative:   Patient discharging home, Will follow up with PCP and uses OTC medications.  ? ?Expected Discharge Plan: Home/Self Care ?Barriers to Discharge: No Barriers Identified ? ?Expected Discharge Plan and Services ?Expected Discharge Plan: Home/Self Care ?  ?  ?  ?  ?Expected Discharge Date: 07/01/21               ?  ?  ?  ?  ?  ?  ?  ?  ?  ?

## 2021-07-04 NOTE — Progress Notes (Signed)
07/04/2021 ? ?Pt presented to nursing station 3 days after discharge asking for a note to return to work.  Note given stating patient can return to work.   Murvin Natal MD  ?

## 2021-08-18 ENCOUNTER — Observation Stay (HOSPITAL_COMMUNITY)
Admission: EM | Admit: 2021-08-18 | Discharge: 2021-08-19 | Disposition: A | Discharge status: Home / Self Care | Payer: Self-pay | Attending: Internal Medicine | Admitting: Internal Medicine

## 2021-08-18 ENCOUNTER — Encounter (HOSPITAL_COMMUNITY): Payer: Self-pay | Admitting: *Deleted

## 2021-08-18 ENCOUNTER — Other Ambulatory Visit: Payer: Self-pay

## 2021-08-18 DIAGNOSIS — D582 Other hemoglobinopathies: Secondary | ICD-10-CM

## 2021-08-18 DIAGNOSIS — E109 Type 1 diabetes mellitus without complications: Secondary | ICD-10-CM | POA: Diagnosis present

## 2021-08-18 DIAGNOSIS — E860 Dehydration: Secondary | ICD-10-CM | POA: Diagnosis present

## 2021-08-18 DIAGNOSIS — R718 Other abnormality of red blood cells: Secondary | ICD-10-CM

## 2021-08-18 DIAGNOSIS — E10649 Type 1 diabetes mellitus with hypoglycemia without coma: Secondary | ICD-10-CM | POA: Insufficient documentation

## 2021-08-18 DIAGNOSIS — Z794 Long term (current) use of insulin: Secondary | ICD-10-CM | POA: Insufficient documentation

## 2021-08-18 DIAGNOSIS — D473 Essential (hemorrhagic) thrombocythemia: Secondary | ICD-10-CM | POA: Insufficient documentation

## 2021-08-18 DIAGNOSIS — Z79899 Other long term (current) drug therapy: Secondary | ICD-10-CM | POA: Insufficient documentation

## 2021-08-18 DIAGNOSIS — Z720 Tobacco use: Secondary | ICD-10-CM | POA: Diagnosis present

## 2021-08-18 DIAGNOSIS — D75839 Thrombocytosis, unspecified: Secondary | ICD-10-CM

## 2021-08-18 DIAGNOSIS — F17210 Nicotine dependence, cigarettes, uncomplicated: Secondary | ICD-10-CM | POA: Diagnosis present

## 2021-08-18 DIAGNOSIS — D72829 Elevated white blood cell count, unspecified: Secondary | ICD-10-CM | POA: Diagnosis present

## 2021-08-18 DIAGNOSIS — R739 Hyperglycemia, unspecified: Secondary | ICD-10-CM

## 2021-08-18 DIAGNOSIS — E162 Hypoglycemia, unspecified: Secondary | ICD-10-CM

## 2021-08-18 DIAGNOSIS — N179 Acute kidney failure, unspecified: Principal | ICD-10-CM | POA: Diagnosis present

## 2021-08-18 LAB — BASIC METABOLIC PANEL
Anion gap: 15 (ref 5–15)
BUN: 63 mg/dL — ABNORMAL HIGH (ref 6–20)
CO2: 32 mmol/L (ref 22–32)
Calcium: 9.9 mg/dL (ref 8.9–10.3)
Chloride: 90 mmol/L — ABNORMAL LOW (ref 98–111)
Creatinine, Ser: 2.34 mg/dL — ABNORMAL HIGH (ref 0.61–1.24)
GFR, Estimated: 38 mL/min — ABNORMAL LOW (ref >60)
Glucose, Bld: 131 mg/dL — ABNORMAL HIGH (ref 70–99)
Potassium: 4.4 mmol/L (ref 3.5–5.1)
Sodium: 137 mmol/L (ref 135–145)

## 2021-08-18 LAB — CBG MONITORING, ED: Glucose-Capillary: 158 mg/dL — ABNORMAL HIGH (ref 70–99)

## 2021-08-18 LAB — CBC
HCT: 52.7 % — ABNORMAL HIGH (ref 39.0–52.0)
Hemoglobin: 17.2 g/dL — ABNORMAL HIGH (ref 13.0–17.0)
MCH: 29.1 pg (ref 26.0–34.0)
MCHC: 32.6 g/dL (ref 30.0–36.0)
MCV: 89.2 fL (ref 80.0–100.0)
Platelets: 467 10*3/uL — ABNORMAL HIGH (ref 150–400)
RBC: 5.91 MIL/uL — ABNORMAL HIGH (ref 4.22–5.81)
RDW: 13.3 % (ref 11.5–15.5)
WBC: 18.8 10*3/uL — ABNORMAL HIGH (ref 4.0–10.5)
nRBC: 0 % (ref 0.0–0.2)

## 2021-08-18 MED ORDER — ONDANSETRON HCL 4 MG/2ML IJ SOLN
4.0000 mg | Freq: Once | INTRAMUSCULAR | Status: AC
  Administered 2021-08-18: 4 mg via INTRAVENOUS
  Filled 2021-08-18: qty 2

## 2021-08-18 MED ORDER — SODIUM CHLORIDE 0.9 % IV BOLUS
1000.0000 mL | Freq: Once | INTRAVENOUS | Status: AC
  Administered 2021-08-18: 1000 mL via INTRAVENOUS

## 2021-08-18 NOTE — ED Triage Notes (Signed)
Pt states CBG's at home in the 300's, + emesis.

## 2021-08-18 NOTE — ED Provider Notes (Signed)
[facility]  Provider Note  CSN: [id number] Arrival date & time: 08/18/21 2038  History Chief Complaint  Patient presents with   Hyperglycemia    KION HUNTSBERRY is a 26 y.o. male with long history of Type 1 DM on insulin but does not have PCP or insurance and so is not getting his medications managed as an outpatient (buys insulin OTC). He reports in the last few days he has had glucose up to 300s with increased urination and multiple episodes of vomiting. He denies fever, cough, chills, abdominal pain. He is concerned he is in DKA again.    Home Medications Prior to Admission medications   Medication Sig Start Date End Date Taking? Authorizing Provider  blood glucose meter kit and supplies Dispense based on patient and insurance preference. Use up to four times daily as directed. (FOR ICD-10 E10.9, E11.9). 07/01/21   Johnson, Clanford L, MD  gabapentin (NEURONTIN) 300 MG capsule Take 1 capsule (300 mg total) by mouth at bedtime. 07/01/21   Johnson, Clanford L, MD  insulin NPH-regular Human (70-30) 100 UNIT/ML injection Inject 12 Units into the skin 2 (two) times daily with a meal. 07/01/21   Johnson, Clanford L, MD  lisinopril (ZESTRIL) 10 MG tablet Take 10 mg by mouth daily.    [provider]  risperiDONE (RISPERDAL) 3 MG tablet Take 3 mg by mouth daily. 12/02/20   [provider]  SYMBICORT 160-4.5 MCG/ACT inhaler Inhale 2 puffs into the lungs 2 (two) times daily. 11/30/20   [provider]     Allergies    Sulfa antibiotics   Review of Systems   Review of Systems Please see HPI for pertinent positives and negatives  Physical Exam BP 124/89 (BP Location: Right Arm)   Pulse (!) 121   Temp 98.3 F (36.8 C) (Oral)   Resp 16   Ht _0  (1.803 m)   Wt 63.5 kg   SpO2 98%   BMI 19.53 kg/m   Physical Exam Vitals and nursing note reviewed.  Constitutional:      Appearance: Normal appearance.  HENT:     Head: Normocephalic  and atraumatic.     Nose: Nose normal.     Mouth/Throat:     Mouth: Mucous membranes are dry.  Eyes:     Extraocular Movements: Extraocular movements intact.     Conjunctiva/sclera: Conjunctivae normal.  Cardiovascular:     Rate and Rhythm: Tachycardia present.  Pulmonary:     Effort: Pulmonary effort is normal.     Breath sounds: Normal breath sounds.  Abdominal:     General: Abdomen is flat.     Palpations: Abdomen is soft.     Tenderness: There is no abdominal tenderness.  Musculoskeletal:        General: No swelling. Normal range of motion.     Cervical back: Neck supple.  Skin:    General: Skin is warm and dry.  Neurological:     General: No focal deficit present.     Mental Status: He is alert.  Psychiatric:        Mood and Affect: Mood normal.     ED Results / Procedures / Treatments   EKG None  Procedures Procedures  Medications Ordered in the ED Medications  sodium chloride 0.9 % bolus 1,000 mL (0 mLs Intravenous Stopped 08/18/21 2344)  ondansetron (ZOFRAN) injection 4 mg (4 mg Intravenous Given 08/18/21 2343)    Initial Impression and Plan  Patient here with  reported hyperglycemia at home. He states he took some insulin prior to coming to the ED. Also having nausea, vomiting and rapid heart rate. Labs done in triage show CBC with leukocytosis and increased Hgb, at least partially due to hemoconcentration. He has BMP with relatively well controlled sugar, no acidosis but there is AKI. Will begin IVF, antiemetics. Plan admission for hydration and recheck of labs. UA pending, but no fever or other infectious symptoms to suggest sepsis.   ED Course   Clinical Course as of 08/18/21 2359  Wed Aug 18, 2021  2358 Spoke with Dr. Josephine Cables, Hospitalist, who will admit for further management.  [CS]    Clinical Course User Index [CS] Truddie Hidden, MD     MDM Rules/Calculators/A&P Medical Decision Making Problems Addressed: AKI (acute kidney injury) [facility]):  acute illness or injury Hyperglycemia: chronic illness or injury  Amount and/or Complexity of Data Reviewed Labs: ordered. Decision-making details documented in ED Course.  Risk Prescription drug management. Decision regarding hospitalization.    Final Clinical Impression(s) / ED Diagnoses Final diagnoses:  Hyperglycemia  AKI (acute kidney injury) [facility])    Rx / DC Orders ED Discharge Orders     None        Truddie Hidden, MD 08/18/21 2359

## 2021-08-19 ENCOUNTER — Other Ambulatory Visit: Payer: Self-pay

## 2021-08-19 ENCOUNTER — Encounter (HOSPITAL_COMMUNITY): Payer: Self-pay | Admitting: Internal Medicine

## 2021-08-19 DIAGNOSIS — N179 Acute kidney failure, unspecified: Secondary | ICD-10-CM

## 2021-08-19 DIAGNOSIS — E860 Dehydration: Secondary | ICD-10-CM

## 2021-08-19 DIAGNOSIS — D582 Other hemoglobinopathies: Secondary | ICD-10-CM

## 2021-08-19 DIAGNOSIS — D72829 Elevated white blood cell count, unspecified: Secondary | ICD-10-CM

## 2021-08-19 DIAGNOSIS — Z720 Tobacco use: Secondary | ICD-10-CM

## 2021-08-19 DIAGNOSIS — R718 Other abnormality of red blood cells: Secondary | ICD-10-CM

## 2021-08-19 DIAGNOSIS — D75839 Thrombocytosis, unspecified: Secondary | ICD-10-CM

## 2021-08-19 DIAGNOSIS — E162 Hypoglycemia, unspecified: Secondary | ICD-10-CM

## 2021-08-19 HISTORY — DX: Acute kidney failure, unspecified: N17.9

## 2021-08-19 HISTORY — DX: Thrombocytosis, unspecified: D75.839

## 2021-08-19 LAB — COMPREHENSIVE METABOLIC PANEL
ALT: 14 U/L (ref 0–44)
AST: 18 U/L (ref 15–41)
Albumin: 3.7 g/dL (ref 3.5–5.0)
Alkaline Phosphatase: 61 U/L (ref 38–126)
Anion gap: 12 (ref 5–15)
BUN: 56 mg/dL — ABNORMAL HIGH (ref 6–20)
CO2: 30 mmol/L (ref 22–32)
Calcium: 8.4 mg/dL — ABNORMAL LOW (ref 8.9–10.3)
Chloride: 90 mmol/L — ABNORMAL LOW (ref 98–111)
Creatinine, Ser: 1.75 mg/dL — ABNORMAL HIGH (ref 0.61–1.24)
GFR, Estimated: 54 mL/min — ABNORMAL LOW (ref >60)
Glucose, Bld: 214 mg/dL — ABNORMAL HIGH (ref 70–99)
Potassium: 3.6 mmol/L (ref 3.5–5.1)
Sodium: 132 mmol/L — ABNORMAL LOW (ref 135–145)
Total Bilirubin: 1 mg/dL (ref 0.3–1.2)
Total Protein: 6.7 g/dL (ref 6.5–8.1)

## 2021-08-19 LAB — CBC
HCT: 44.5 % (ref 39.0–52.0)
Hemoglobin: 14.5 g/dL (ref 13.0–17.0)
MCH: 29.5 pg (ref 26.0–34.0)
MCHC: 32.6 g/dL (ref 30.0–36.0)
MCV: 90.4 fL (ref 80.0–100.0)
Platelets: 394 10*3/uL (ref 150–400)
RBC: 4.92 MIL/uL (ref 4.22–5.81)
RDW: 13.4 % (ref 11.5–15.5)
WBC: 14.9 10*3/uL — ABNORMAL HIGH (ref 4.0–10.5)
nRBC: 0 % (ref 0.0–0.2)

## 2021-08-19 LAB — GLUCOSE, CAPILLARY
Glucose-Capillary: 45 mg/dL — ABNORMAL LOW (ref 70–99)
Glucose-Capillary: 64 mg/dL — ABNORMAL LOW (ref 70–99)
Glucose-Capillary: 261 mg/dL — ABNORMAL HIGH (ref 70–99)
Glucose-Capillary: 248 mg/dL — ABNORMAL HIGH (ref 70–99)
Glucose-Capillary: 148 mg/dL — ABNORMAL HIGH (ref 70–99)
Glucose-Capillary: 159 mg/dL — ABNORMAL HIGH (ref 70–99)

## 2021-08-19 LAB — HEMOGLOBIN A1C
Hgb A1c MFr Bld: 9.7 % — ABNORMAL HIGH (ref 4.8–5.6)
Mean Plasma Glucose: 231.69 mg/dL

## 2021-08-19 LAB — APTT: aPTT: 30 seconds (ref 24–36)

## 2021-08-19 LAB — MAGNESIUM: Magnesium: 2.1 mg/dL (ref 1.7–2.4)

## 2021-08-19 LAB — PHOSPHORUS: Phosphorus: 4.8 mg/dL — ABNORMAL HIGH (ref 2.5–4.6)

## 2021-08-19 MED ORDER — INSULIN ASPART PROT & ASPART (70-30 MIX) 100 UNIT/ML ~~LOC~~ SUSP
6.0000 [IU] | Freq: Two times a day (BID) | SUBCUTANEOUS | Status: DC
  Administered 2021-08-19: 6 [IU] via SUBCUTANEOUS
  Filled 2021-08-19: qty 10

## 2021-08-19 MED ORDER — INSULIN ASPART 100 UNIT/ML IJ SOLN: 0.0000 [IU] | Freq: Every day | INTRAMUSCULAR | Status: DC

## 2021-08-19 MED ORDER — ENOXAPARIN SODIUM 40 MG/0.4ML IJ SOSY
40.0000 mg | PREFILLED_SYRINGE | INTRAMUSCULAR | Status: DC
  Filled 2021-08-19: qty 0.4

## 2021-08-19 MED ORDER — INSULIN ASPART 100 UNIT/ML IJ SOLN
0.0000 [IU] | Freq: Three times a day (TID) | INTRAMUSCULAR | Status: DC
  Administered 2021-08-19: 1 [IU] via SUBCUTANEOUS

## 2021-08-19 MED ORDER — SODIUM CHLORIDE 0.9 % IV SOLN: INTRAVENOUS | Status: DC

## 2021-08-19 NOTE — Progress Notes (Signed)
  Transition of Care [facility]) Screening Note   Patient Details  Name: Pedro [name] Date of Birth: 1995/04/08   Transition of Care [facility]) CM/SW Contact:    Iona Beard, [place] Phone Number: 08/19/2021, 10:05 AM  TOC consulted for PCP needs and medication assistance. CSW spoke with pt in room to explain Care Connect program. CSW inquired as to if pt was agreeable to referral. Pt states that he does not want CSW to make this referral at this time. CSW left Care Connect information at bedside and explained that if he changes his mind he can call the number listed on the paper. CSW also spoke with pt about his ability to get his insulin. Pt states that he is able to get this like he has been.   Transition of Care Department [facility]) has reviewed patient and no TOC needs have been identified at this time. We will continue to monitor patient advancement through interdisciplinary progression rounds. If new patient transition needs arise, please place a TOC consult.

## 2021-08-19 NOTE — Inpatient Diabetes Management (Addendum)
Inpatient Diabetes Program Recommendations  AACE/ADA: New Consensus Statement on Inpatient Glycemic Control   Target Ranges:  Prepandial:   less than 140 mg/dL      Peak postprandial:   less than 180 mg/dL (1-2 hours)      Critically ill patients:  140 - 180 mg/dL    Latest Reference Range & Units 08/19/21 02:04 08/19/21 02:27 08/19/21 03:39 08/19/21 05:16 08/19/21 06:59  Glucose-Capillary 70 - 99 mg/dL 45 (L) 64 (L) 261 (H) 248 (H) 159 (H)    Latest Reference Range & Units 08/18/21 21:45  CO2 22 - 32 mmol/L 32  Glucose 70 - 99 mg/dL 131 (H)  Anion gap 5 - 15  15   Review of Glycemic Control  Diabetes history: DM1 (does NOT make any insulin; requires basal, correction, and carbohydrate coverage insulin) Outpatient Diabetes medications: 70/30 30 units BID Current orders for Inpatient glycemic control: 70/30 6 units BID, Novolog 0-9 units TID with meals, Novolog 0-5 units QHS  Inpatient Diabetes Program Recommendations:    HbgA1C: Please consider ordering an A1C to evaluate glycemic control over the past 2-3 months.  NOTE: Patient has Type 1 DM and is taking 70/30 insulin outpatient. Patient does not have any insurance and has been getting insulin over the counter and managing his own DM. This is patient's 3rd hospital admission in past 8 months. Inpatient diabetes coordinator spoke with patient last during hospital admission in October 2022.  Per chart, patient is not able to see prior PCP due to cost. Ordered TOC consult to assist with getting patient established with local clinic and medication assistance if needed.   Addendum 08/19/21'@11'$ :25-Talked with patient over the phone regarding DM. Patient confirms that he is taking 70/30 30 units BID and getting insulin over the counter. Patient states that he currently has 70/30 insulin at home and he reports that he took 70/30 30 units prior to coming to the hospital yesterday.  Inquired as to whether he had symptoms of hypoglycemia yesterday  when glucose was 45 mg/dl and patient reports that he did and that he can feel when his glucose is low. Explained that the 70/30 units may be too much especially when he is not able to eat.  Patient reports his glucose is "pretty good" and he checks glucose twice a day. Inquired about glucose values and he stated 100-200's mg/dl. Discussed glucose and A1C goals. Discussed importance of checking CBGs and maintaining good CBG control to prevent long-term and short-term complications. Explained how hyperglycemia leads to damage within blood vessels which lead to the common complications seen with uncontrolled diabetes. Stressed to the patient the importance of improving glycemic control to prevent further complications from uncontrolled diabetes especially given he is 26 years old. Patient confirms that [facility] provided information for Care Connect. Strongly encouraged patient to reach out to Care Connect so he can get established with a PCP to help him get DM under better control and to assist him to get insulin if needed. Patient states he would plan to reach out to Care Connect.   Patient verbalized understanding of information discussed and reports no further questions at this time related to diabetes.   Thanks, Barnie Alderman, RN, MSN, [place] Diabetes Coordinator Inpatient Diabetes Program [phone] (Team Pager from 8am to [place])

## 2021-08-19 NOTE — Progress Notes (Signed)
Nsg Discharge Note  Admit Date:  08/18/2021 Discharge date: 08/19/2021   [name] to be D/C'd Home per MD order.  AVS completed.  Copy for chart, and copy for patient signed, and dated. Patient/caregiver able to verbalize understanding.  Discharge Medication: Allergies as of 08/19/2021       Reactions   Sulfa Antibiotics Hives, Other (See Comments)   Reaction:  Fever        Medication List     STOP taking these medications    lisinopril 10 MG tablet Commonly known as: ZESTRIL       TAKE these medications    blood glucose meter kit and supplies Dispense based on patient and insurance preference. Use up to four times daily as directed. (FOR ICD-10 E10.9, E11.9).   gabapentin 300 MG capsule Commonly known as: NEURONTIN Take 1 capsule (300 mg total) by mouth at bedtime.   insulin NPH-regular Human (70-30) 100 UNIT/ML injection Inject 12 Units into the skin 2 (two) times daily with a meal.   risperiDONE 3 MG tablet Commonly known as: RISPERDAL Take 3 mg by mouth daily.   Symbicort 160-4.5 MCG/ACT inhaler Generic drug: budesonide-formoterol Inhale 2 puffs into the lungs 2 (two) times daily.        Discharge Assessment: Vitals:   08/19/21 0100 08/19/21 0122  BP: 119/77 (!) 137/93  Pulse: 91 80  Resp: 18 18  Temp:    SpO2: 96% 99%   Skin clean, dry and intact without evidence of skin break down, no evidence of skin tears noted. IV catheter discontinued intact. Site without signs and symptoms of complications - no redness or edema noted at insertion site, patient denies c/o pain - only slight tenderness at site.  Dressing with slight pressure applied.  D/c Instructions-Education: Discharge instructions given to patient/family with verbalized understanding. D/c education completed with patient/family including follow up instructions, medication list, d/c activities limitations if indicated, with other d/c instructions as indicated by MD - patient able to  verbalize understanding, all questions fully answered. Patient instructed to return to ED, call 911, or call MD for any changes in condition.  Patient escorted via [place], and D/C home via private auto.  Loa Socks, RN 08/19/2021 11:55 AM

## 2021-08-19 NOTE — Progress Notes (Signed)
Pt has had no episodes of nausea or vomiting since he arrived on the floor. U/A with reflex still needed as pt denies having to void at this time (8280). He is currently sleeping.

## 2021-08-19 NOTE — Discharge Summary (Signed)
Physician Discharge Summary  [name] PNP:[id number] DOB: [birth year] DOA: 08/18/2021  PCP: Neale Burly, MD  Admit date: 08/18/2021  Discharge date: 08/19/2021  Admitted From:Home  Disposition:  Home  Recommendations for Outpatient Follow-up:  Follow up with PCP recommended, however patient refuses and manages his own insulin at home.  Unfortunately he remains a high risk of readmission based on this information.  He has been given information for careconnects if he changes his mind Continue 70/30 insulin at home as prior Hold home lisinopril for now until further follow-up with PCP and repeat BMP to ensure creatinine levels have normalized  Home Health: None  Equipment/Devices: None  Discharge Condition:Stable  CODE STATUS: Full  Diet recommendation: Heart Healthy/carb modified  Brief/Interim Summary: Per HPI: Pedro [name] is a 26 y.o. male with medical history significant of T1DM and tobacco abuse who presents to the emergency department due to several day onset of multiple episodes of vomiting and increased urination.  Blood glucose at home was in the 300s.  He does not have a primary care physician or insurance, patient states that he uses 70/30 insulin.  He thought he was possibly having a DKA, so he came to the ED for further evaluation and management.    -Patient was not noted to be in DKA, but rather dehydrated with AKI and was actually hypoglycemic.  He is no longer having any symptoms of nausea or vomiting and is tolerating diet.  His AKI is improving with IV fluid and should continue to improve as long as he maintains good oral intake.  He has been instructed to remain off lisinopril for now until further follow-up and hopefully he will choose to do so, but he does not have PCP at this time.  He remains at high risk for readmission, but is otherwise stable for discharge.  Discharge Diagnoses:  Principal Problem:   Acute kidney injury ([place]) Active  Problems:   Leukocytosis   Tobacco abuse   Dehydration   DM type 1 (diabetes mellitus, type 1) (HCC)   Hypoglycemia   Thrombocytosis   Elevated hemoglobin (HCC)   Elevated hematocrit  Principal discharge diagnosis: Prerenal AKI secondary to dehydration with some mild hypoglycemia in the setting of type 1 diabetes.  Likely medication noncompliance.  Discharge Instructions  Discharge Instructions     Diet - low sodium heart healthy   Complete by: As directed    Increase activity slowly   Complete by: As directed       Allergies as of 08/19/2021       Reactions   Sulfa Antibiotics Hives, Other (See Comments)   Reaction:  Fever        Medication List     STOP taking these medications    lisinopril 10 MG tablet Commonly known as: ZESTRIL       TAKE these medications    blood glucose meter kit and supplies Dispense based on patient and insurance preference. Use up to four times daily as directed. (FOR ICD-10 E10.9, E11.9).   gabapentin 300 MG capsule Commonly known as: NEURONTIN Take 1 capsule (300 mg total) by mouth at bedtime.   insulin NPH-regular Human (70-30) 100 UNIT/ML injection Inject 12 Units into the skin 2 (two) times daily with a meal.   risperiDONE 3 MG tablet Commonly known as: RISPERDAL Take 3 mg by mouth daily.   Symbicort 160-4.5 MCG/ACT inhaler Generic drug: budesonide-formoterol Inhale 2 puffs into the lungs 2 (two) times daily.  Follow-up Information     Neale Burly, MD. Schedule an appointment as soon as possible for a visit in 1 week(s).   Specialty: Internal Medicine Contact information: [place] [address] 003 [phone]                Allergies  Allergen Reactions   Sulfa Antibiotics Hives and Other (See Comments)    Reaction:  Fever     Consultations: None   Procedures/Studies: No results found.   Discharge Exam: Vitals:   08/19/21 0100 08/19/21 0122  BP: 119/77 (!)  137/93  Pulse: 91 80  Resp: 18 18  Temp:    SpO2: 96% 99%   Vitals:   08/19/21 0054 08/19/21 0100 08/19/21 0122 08/19/21 0140  BP: 113/72 119/77 (!) 137/93   Pulse: (!) 111 91 80   Resp: _0 Temp: 98 F (36.7 C)     TempSrc: Oral     SpO2: 99% 96% 99%   Weight:   60.7 kg 60.7 kg  Height:   _1  (1.803 m) _2  (1.803 m)    General: Pt is alert, awake, not in acute distress Cardiovascular: RRR, S1/S2 +, no rubs, no gallops Respiratory: CTA bilaterally, no wheezing, no rhonchi Abdominal: Soft, NT, ND, bowel sounds + Extremities: no edema, no cyanosis    The results of significant diagnostics from this hospitalization (including imaging, microbiology, ancillary and laboratory) are listed below for reference.     Microbiology: No results found for this or any previous visit (from the past 240 hour(s)).   Labs: BNP (last 3 results) No results for input(s): "BNP" in the last 8760 hours. Basic Metabolic Panel: Recent Labs  Lab 08/18/21 2145 08/19/21 0432  NA 137 132*  K 4.4 3.6  CL 90* 90*  CO2 32 30  GLUCOSE 131* 214*  BUN 63* 56*  CREATININE 2.34* 1.75*  CALCIUM 9.9 8.4*  MG  --  2.1  PHOS  --  4.8*   Liver Function Tests: Recent Labs  Lab 08/19/21 0432  AST 18  ALT 14  ALKPHOS 61  BILITOT 1.0  PROT 6.7  ALBUMIN 3.7   No results for input(s): "LIPASE", "AMYLASE" in the last 168 hours. No results for input(s): "AMMONIA" in the last 168 hours. CBC: Recent Labs  Lab 08/18/21 2145 08/19/21 0432  WBC 18.8* 14.9*  HGB 17.2* 14.5  HCT 52.7* 44.5  MCV 89.2 90.4  PLT 467* 394   Cardiac Enzymes: No results for input(s): "CKTOTAL", "CKMB", "CKMBINDEX", "TROPONINI" in the last 168 hours. BNP: Invalid input(s): "POCBNP" CBG: Recent Labs  Lab 08/19/21 0227 08/19/21 0339 08/19/21 0516 08/19/21 0659 08/19/21 0723  GLUCAP 64* 261* 248* 159* 148*   D-Dimer No results for input(s): "DDIMER" in the last 72 hours. Hgb A1c No results for  input(s): "HGBA1C" in the last 72 hours. Lipid Profile No results for input(s): "CHOL", "HDL", "LDLCALC", "TRIG", "CHOLHDL", "LDLDIRECT" in the last 72 hours. Thyroid function studies No results for input(s): "TSH", "T4TOTAL", "T3FREE", "THYROIDAB" in the last 72 hours.  Invalid input(s): "FREET3" Anemia work up No results for input(s): "VITAMINB12", "FOLATE", "FERRITIN", "TIBC", "IRON", "RETICCTPCT" in the last 72 hours. Urinalysis    Component Value Date/Time   COLORURINE STRAW (A) 06/30/2021 2150   APPEARANCEUR CLEAR 06/30/2021 2150   LABSPEC 1.021 06/30/2021 2150   PHURINE 6.0 06/30/2021 2150   GLUCOSEU >=500 (A) 06/30/2021 2150   HGBUR SMALL (A) 06/30/2021 2150   BILIRUBINUR NEGATIVE 06/30/2021  2150   BILIRUBINUR neg 04/16/2014 1752   KETONESUR 20 (A) 06/30/2021 2150   PROTEINUR NEGATIVE 06/30/2021 2150   UROBILINOGEN 0.2 10/17/2014 0743   NITRITE NEGATIVE 06/30/2021 2150   LEUKOCYTESUR NEGATIVE 06/30/2021 2150   Sepsis Labs Recent Labs  Lab 08/18/21 2145 08/19/21 0432  WBC 18.8* 14.9*   Microbiology No results found for this or any previous visit (from the past 240 hour(s)).   Time coordinating discharge: 35 minutes  SIGNED:   Rodena Goldmann, DO [facility] 08/19/2021, 10:47 AM  If 7PM-7AM, please contact night-coverage [email or web address]

## 2021-08-19 NOTE — H&P (Signed)
History and Physical    Patient: Pedro [name]:[id number] DOB: [birth year] DOA: 08/18/2021 DOS: the patient was seen and examined on 08/19/2021 PCP: Neale Burly, MD  Patient coming from: Home  Chief Complaint:  Chief Complaint  Patient presents with   Hyperglycemia   HPI: Pedro [name] is a 26 y.o. male with medical history significant of T1DM and tobacco abuse who presents to the emergency department due to several day onset of multiple episodes of vomiting and increased urination.  Blood glucose at home was in the 300s.  He does not have a primary care physician or insurance, patient states that he uses 70/30 insulin.  He thought he was possibly having a DKA, so he came to the ED for further evaluation and management.  He denies fever, chills, cough, chest pain, shortness of breath.  ED Course:  In the emergency department, he was initially tachycardic prior to having a normalized HR.  Other vital signs are within normal range.  Work-up in the ED showed elevated WBC, platelets and H/H.  BMP showed elevated BUN/creatinine 63/2.34 (baseline creatinine at 1.0-1.4). IV hydration was provided, hospitalist was asked to admit patient for further evaluation and management.  Review of Systems: Review of systems as noted in the HPI. All other systems reviewed and are negative.   Past Medical History:  Diagnosis Date   ADHD (attention deficit hyperactivity disorder)    DM type 1 (diabetes mellitus, type 1) ([place])    diagnosed age 82   Herpes    High cholesterol    Hypertension    Noncompliance with medications    Past Surgical History:  Procedure Laterality Date   INTERCOSTAL NERVE BLOCK Right 12/10/2020   Procedure: INTERCOSTAL NERVE BLOCK;  Surgeon: Lajuana Matte, MD;  Location: [place];  Service: Thoracic;  Laterality: Right;   none     PLEURADESIS Right 12/10/2020   Procedure: MECHANICAL PLEURADESIS;  Surgeon: Lajuana Matte, MD;  Location: [place];  Service:  Thoracic;  Laterality: Right;   PLEURECTOMY Right 12/10/2020   Procedure: APICAL PLEURECTOMY;  Surgeon: Lajuana Matte, MD;  Location: [place];  Service: Thoracic;  Laterality: Right;    Social History:  reports that he has been smoking cigarettes. He has a 5.00 pack-year smoking history. He has never used smokeless tobacco. He reports that he does not currently use drugs after having used the following drugs: Marijuana, Other-see comments, and Benzodiazepines. He reports that he does not drink alcohol.   Allergies  Allergen Reactions   Sulfa Antibiotics Hives and Other (See Comments)    Reaction:  Fever     Family History  Problem Relation Age of Onset   Diabetes Mellitus I Paternal Grandmother    Diabetes Mellitus I Paternal Aunt    Anxiety disorder Mother    Hypertension Mother    Hypertension Father      Prior to Admission medications   Medication Sig Start Date End Date Taking? Authorizing Provider  blood glucose meter kit and supplies Dispense based on patient and insurance preference. Use up to four times daily as directed. (FOR ICD-10 E10.9, E11.9). 07/01/21   Johnson, Clanford L, MD  gabapentin (NEURONTIN) 300 MG capsule Take 1 capsule (300 mg total) by mouth at bedtime. 07/01/21   Johnson, Clanford L, MD  insulin NPH-regular Human (70-30) 100 UNIT/ML injection Inject 12 Units into the skin 2 (two) times daily with a meal. 07/01/21   Johnson, Clanford L, MD  lisinopril (ZESTRIL) 10 MG tablet  Take 10 mg by mouth daily.    [provider]  risperiDONE (RISPERDAL) 3 MG tablet Take 3 mg by mouth daily. 12/02/20   [provider]  SYMBICORT 160-4.5 MCG/ACT inhaler Inhale 2 puffs into the lungs 2 (two) times daily. 11/30/20   [provider]    Physical Exam: BP (!) 137/93 (BP Location: Left Arm)   Pulse 80   Temp 98 F (36.7 C) (Oral)   Resp 18   Ht _0  (1.803 m)   Wt 60.7 kg   SpO2 99%   BMI 18.66 kg/m   General: 26 y.o. year-old male  well developed well nourished in no acute distress.  Alert and oriented x3. HEENT: NCAT, EOMI, dry mucous membrane Neck: Supple, trachea medial Cardiovascular: Tachycardia.  Regular rate and rhythm with no rubs or gallops.  No thyromegaly or JVD noted.  No lower extremity edema. 2/4 pulses in all 4 extremities. Respiratory: Clear to auscultation with no wheezes or rales. Good inspiratory effort. Abdomen: Soft, nontender nondistended with normal bowel sounds x4 quadrants. Muskuloskeletal: No cyanosis, clubbing or edema noted bilaterally Neuro: CN II-XII intact, strength 5/5 x 4, sensation, reflexes intact Skin: No ulcerative lesions noted or rashes.  Skin warm and dry Psychiatry: Judgement and insight appear normal. Mood is appropriate for condition and setting          Labs on Admission:  Basic Metabolic Panel: Recent Labs  Lab 08/18/21 2145  NA 137  K 4.4  CL 90*  CO2 32  GLUCOSE 131*  BUN 63*  CREATININE 2.34*  CALCIUM 9.9   Liver Function Tests: No results for input(s): "AST", "ALT", "ALKPHOS", "BILITOT", "PROT", "ALBUMIN" in the last 168 hours. No results for input(s): "LIPASE", "AMYLASE" in the last 168 hours. No results for input(s): "AMMONIA" in the last 168 hours. CBC: Recent Labs  Lab 08/18/21 2145  WBC 18.8*  HGB 17.2*  HCT 52.7*  MCV 89.2  PLT 467*   Cardiac Enzymes: No results for input(s): "CKTOTAL", "CKMB", "CKMBINDEX", "TROPONINI" in the last 168 hours.  BNP (last 3 results) No results for input(s): "BNP" in the last 8760 hours.  ProBNP (last 3 results) No results for input(s): "PROBNP" in the last 8760 hours.  CBG: Recent Labs  Lab 08/18/21 2118 08/19/21 0204 08/19/21 0227  GLUCAP 158* 45* 64*    Radiological Exams on Admission: No results found.  EKG: I independently viewed the EKG done and my findings are as followed: EKG was not done in the ED  Assessment/Plan Present on Admission:  Acute kidney injury ([place])  Dehydration   Leukocytosis  Tobacco abuse  DM type 1 (diabetes mellitus, type 1) ([place])  Principal Problem:   Acute kidney injury ([place]) Active Problems:   Leukocytosis   Tobacco abuse   Dehydration   DM type 1 (diabetes mellitus, type 1) ([facility])   Hypoglycemia   Thrombocytosis   Elevated hemoglobin ([facility])   Elevated hematocrit  Acute kidney injury Dehydration BUN/creatinine 63/2.34 (baseline creatinine at 1.0-1.4). Continue IV hydration Renally adjust medications, avoid nephrotoxic agents/dehydration/hypotension  Leukocytosis, thrombocytosis, elevated H/H possibly secondary to hemoconcentration Continue IV hydration  Hypoglycemia CBG 45 > 64 Orange juice was provided, continue CBG and consider D5 NS if patient continues to be hypoglycemic  Type 1 diabetes mellitus Patient is currently hypoglycemic, continue CBG  Tobacco abuse Patient was counseled on tobacco abuse cessation  Consults: None  Family Communication: None at bedside  Severity of Illness: The appropriate patient status for this patient is OBSERVATION.  Observation status is judged to be reasonable and necessary in order to provide the required intensity of service to ensure the patient's safety. The patient's presenting symptoms, physical exam findings, and initial radiographic and laboratory data in the context of their medical condition is felt to place them at decreased risk for further clinical deterioration. Furthermore, it is anticipated that the patient will be medically stable for discharge from the hospital within 2 midnights of admission.   Author: Bernadette Hoit, DO 08/19/2021 3:36 AM  For on call review [email or web address].

## 2021-08-24 LAB — GLUCOSE, CAPILLARY: Glucose-Capillary: 238 mg/dL — ABNORMAL HIGH (ref 70–99)

## 2021-11-29 ENCOUNTER — Encounter (HOSPITAL_COMMUNITY): Payer: Self-pay | Admitting: Emergency Medicine

## 2021-11-29 ENCOUNTER — Emergency Department (HOSPITAL_COMMUNITY)
Admission: EM | Admit: 2021-11-29 | Discharge: 2021-11-30 | Disposition: A | Discharge status: Home / Self Care | Payer: Managed Care, Other (non HMO) | Attending: Emergency Medicine | Admitting: Emergency Medicine

## 2021-11-29 ENCOUNTER — Emergency Department (HOSPITAL_COMMUNITY): Payer: Managed Care, Other (non HMO)

## 2021-11-29 DIAGNOSIS — R410 Disorientation, unspecified: Secondary | ICD-10-CM | POA: Diagnosis not present

## 2021-11-29 DIAGNOSIS — R55 Syncope and collapse: Secondary | ICD-10-CM | POA: Diagnosis not present

## 2021-11-29 DIAGNOSIS — Z794 Long term (current) use of insulin: Secondary | ICD-10-CM | POA: Diagnosis not present

## 2021-11-29 DIAGNOSIS — R258 Other abnormal involuntary movements: Secondary | ICD-10-CM | POA: Diagnosis not present

## 2021-11-29 DIAGNOSIS — E1065 Type 1 diabetes mellitus with hyperglycemia: Secondary | ICD-10-CM | POA: Diagnosis not present

## 2021-11-29 LAB — CBG MONITORING, ED: Glucose-Capillary: 83 mg/dL (ref 70–99)

## 2021-11-29 NOTE — ED Notes (Signed)
Patient given a bag dinner to eat.

## 2021-11-29 NOTE — ED Triage Notes (Signed)
Pt BIB [facility] from home for possible seizure. Pt denies loss of bowel or bladder. Pt sattes he has a hx of seizures when he is in DKA. CBG for EMS 106.

## 2021-11-29 NOTE — ED Provider Notes (Signed)
[facility] EMERGENCY DEPARTMENT Provider Note   CSN: [id number] Arrival date & time: 11/29/21  2004     History  Chief Complaint  Patient presents with   Seizures    Pedro [name] is a 26 y.o. male with a history of type 1 diabetes and prior history of DKA requiring admission presenting for evaluation of a loss of consciousness prior to arrival.  He describes taking his insulin this morning then ate a sandwich around 930, after which she did not have an opportunity to eat anymore until he arrived home this evening.  Before he was able to eat dinner he became diaphoretic and fell in his living room, girlfriend witnessed seizure-like activity, stating he was shaking, this lasted for approximately a minute after which he woke and was briefly confused as she states he started talking about an event that it occurred 3 days prior.  He does not have a history of seizures.  He denies headache, complaints of pain from fall, nausea, chest pain or abdominal pain.  He had no loss of bowel or bladder during this event.  He does have a history of polysubstance abuse but denies any use today.  Patient arrives by EMS, at the home a CBG was 106 before transport.  The history is provided by the patient and a significant other (Spoke with girlfriend Threasa Alpha per phone with pt permission).       Home Medications Prior to Admission medications   Medication Sig Start Date End Date Taking? Authorizing Provider  blood glucose meter kit and supplies Dispense based on patient and insurance preference. Use up to four times daily as directed. (FOR ICD-10 E10.9, E11.9). 07/01/21   Johnson, Clanford L, MD  gabapentin (NEURONTIN) 300 MG capsule Take 1 capsule (300 mg total) by mouth at bedtime. 07/01/21   Johnson, Clanford L, MD  insulin NPH-regular Human (70-30) 100 UNIT/ML injection Inject 12 Units into the skin 2 (two) times daily with a meal. 07/01/21   Johnson, Clanford L, MD  risperiDONE (RISPERDAL) 3 MG  tablet Take 3 mg by mouth daily. 12/02/20   [provider]  SYMBICORT 160-4.5 MCG/ACT inhaler Inhale 2 puffs into the lungs 2 (two) times daily. 11/30/20   [provider]      Allergies    Sulfa antibiotics    Review of Systems   Review of Systems  Constitutional:  Negative for chills and fever.  HENT:  Negative for congestion and sore throat.   Eyes: Negative.   Respiratory:  Negative for chest tightness and shortness of breath.   Cardiovascular:  Negative for chest pain.  Gastrointestinal:  Negative for abdominal pain and nausea.  Genitourinary: Negative.   Musculoskeletal:  Negative for arthralgias, joint swelling and neck pain.  Skin: Negative.  Negative for rash and wound.  Neurological:  Negative for dizziness, weakness, light-headedness, numbness and headaches.       Negative except as mentioned in HPI.    Psychiatric/Behavioral: Negative.      Physical Exam Updated Vital Signs BP 116/83   Pulse 88   Temp 99 F (37.2 C) (Oral)   Resp 15   Ht 5\' 11"  (1.803 m)   Wt 60.7 kg   SpO2 99%   BMI 18.66 kg/m  Physical Exam Vitals and nursing note reviewed.  Constitutional:      General: He is not in acute distress.    Appearance: He is well-developed.     Comments: Appears tired but awake, alert, appropriate.  HENT:     Head: Normocephalic and atraumatic.  Eyes:     Conjunctiva/sclera: Conjunctivae normal.  Cardiovascular:     Rate and Rhythm: Normal rate and regular rhythm.     Heart sounds: Normal heart sounds.  Pulmonary:     Effort: Pulmonary effort is normal.     Breath sounds: Normal breath sounds. No wheezing.  Abdominal:     General: Bowel sounds are normal.     Palpations: Abdomen is soft.     Tenderness: There is no abdominal tenderness.  Musculoskeletal:        General: Normal range of motion.     Cervical back: Normal range of motion.  Skin:    General: Skin is warm and dry.  Neurological:     General: No focal deficit present.      Mental Status: He is alert and oriented to person, place, and time.     Cranial Nerves: No cranial nerve deficit.     Sensory: No sensory deficit.     ED Results / Procedures / Treatments   Labs (all labs ordered are listed, but only abnormal results are displayed) Labs Reviewed  CBC WITH DIFFERENTIAL/PLATELET  COMPREHENSIVE METABOLIC PANEL  LACTIC ACID, PLASMA  LACTIC ACID, PLASMA  URINALYSIS, ROUTINE W REFLEX MICROSCOPIC  RAPID URINE DRUG SCREEN, HOSP PERFORMED  CBG MONITORING, ED    EKG EKG Interpretation  Date/Time:  Monday November 29 2021 20:10:33 EDT Ventricular Rate:  103 PR Interval:  154 QRS Duration: 91 QT Interval:  330 QTC Calculation: 432 R Axis:   93 Text Interpretation: Sinus tachycardia Consider left atrial enlargement Borderline right axis deviation RSR' in V1 or V2, probably normal variant No acute changes No significant change since last tracing Confirmed by Varney Biles [phone]) on 11/29/2021 10:18:41 PM  Radiology No results found.  Procedures Procedures    Medications Ordered in ED Medications - No data to display  ED Course/ Medical Decision Making/ A&P                           Medical Decision Making Patient with a history of type 1 diabetes and prior DKA requiring admission presenting for evaluation of either of new onset seizure or possibly syncopal event prior to arrival.  Initial CBG was 106 prior to arrival, 83 at recheck here, definitely not dka nor hypoglycemia.  Pending additional labs and CT imaging.  Prob dispo home if further labs and imaging negative.   Pt care assumed by Dr. Sedonia Small.    Amount and/or Complexity of Data Reviewed Labs: ordered.           Final Clinical Impression(s) / ED Diagnoses Final diagnoses:  None    Rx / DC Orders ED Discharge Orders     None         Landis Martins 11/29/21 2354    Maudie Flakes, MD 11/30/21 [phone]

## 2021-11-29 NOTE — ED Provider Notes (Signed)
  Provider Note MRN:  [id number]  [place] date & time: 11/30/21    ED Course and Medical Decision Making  Assumed care from PA Lacona Idol at shift change.  Question seizure versus syncopal episode earlier today.  Took insulin but forgot to eat.  Was not hypoglycemic in the field.  Quick return to baseline.  Awaiting labs, CT.  Work-up is reassuring, no significant blood count or electrolyte disturbance, CT normal.  With further history patient explains that he does this every few months or so.  He had an obvious prodrome of malaise, lightheadedness, he did not have urinary incontinence, he had a quick return to baseline.  And so syncope is much favored over seizure.  He does not have any fever or abdominal pain or tenderness or burning with urination, no need for urinalysis at this time.  Patient wants to go home, appropriate for discharge.  Procedures  Final Clinical Impressions(s) / ED Diagnoses     ICD-10-CM   1. Syncope, unspecified syncope type  R55       ED Discharge Orders     None         Discharge Instructions      You were evaluated in the Emergency Department and after careful evaluation, we did not find any emergent condition requiring admission or further testing in the hospital.  Your exam/testing today is overall reassuring.  Recommend follow-up with your primary care doctor to discuss your symptoms.  Please return to the Emergency Department if you experience any worsening of your condition.   Thank you for allowing [place] to be a part of your care.      Barth Kirks. Sedonia Small, [place] [email or web address]    Maudie Flakes, MD 11/30/21 [phone]

## 2021-11-30 LAB — CBC WITH DIFFERENTIAL/PLATELET
Abs Immature Granulocytes: 0.04 10*3/uL (ref 0.00–0.07)
Basophils Absolute: 0.1 10*3/uL (ref 0.0–0.1)
Basophils Relative: 1 %
Eosinophils Absolute: 0.3 10*3/uL (ref 0.0–0.5)
Eosinophils Relative: 2 %
HCT: 43 % (ref 39.0–52.0)
Hemoglobin: 13.5 g/dL (ref 13.0–17.0)
Immature Granulocytes: 0 %
Lymphocytes Relative: 24 %
Lymphs Abs: 3.2 10*3/uL (ref 0.7–4.0)
MCH: 29.3 pg (ref 26.0–34.0)
MCHC: 31.4 g/dL (ref 30.0–36.0)
MCV: 93.5 fL (ref 80.0–100.0)
Monocytes Absolute: 0.8 10*3/uL (ref 0.1–1.0)
Monocytes Relative: 6 %
Neutro Abs: 9 10*3/uL — ABNORMAL HIGH (ref 1.7–7.7)
Neutrophils Relative %: 67 %
Platelets: 278 10*3/uL (ref 150–400)
RBC: 4.6 MIL/uL (ref 4.22–5.81)
RDW: 13.9 % (ref 11.5–15.5)
WBC: 13.4 10*3/uL — ABNORMAL HIGH (ref 4.0–10.5)
nRBC: 0 % (ref 0.0–0.2)

## 2021-11-30 LAB — COMPREHENSIVE METABOLIC PANEL
ALT: 11 U/L (ref 0–44)
AST: 12 U/L — ABNORMAL LOW (ref 15–41)
Albumin: 3.4 g/dL — ABNORMAL LOW (ref 3.5–5.0)
Alkaline Phosphatase: 54 U/L (ref 38–126)
Anion gap: 8 (ref 5–15)
BUN: 20 mg/dL (ref 6–20)
CO2: 25 mmol/L (ref 22–32)
Calcium: 8.9 mg/dL (ref 8.9–10.3)
Chloride: 107 mmol/L (ref 98–111)
Creatinine, Ser: 1.11 mg/dL (ref 0.61–1.24)
GFR, Estimated: >60 mL/min (ref >60)
Glucose, Bld: 219 mg/dL — ABNORMAL HIGH (ref 70–99)
Potassium: 4 mmol/L (ref 3.5–5.1)
Sodium: 140 mmol/L (ref 135–145)
Total Bilirubin: 1 mg/dL (ref 0.3–1.2)
Total Protein: 6.1 g/dL — ABNORMAL LOW (ref 6.5–8.1)

## 2021-11-30 LAB — LACTIC ACID, PLASMA: Lactic Acid, Venous: 0.7 mmol/L (ref 0.5–1.9)

## 2021-11-30 NOTE — Discharge Instructions (Signed)
You were evaluated in the Emergency Department and after careful evaluation, we did not find any emergent condition requiring admission or further testing in the hospital.  Your exam/testing today is overall reassuring.  Recommend follow-up with your primary care doctor to discuss your symptoms.  Please return to the Emergency Department if you experience any worsening of your condition.   Thank you for allowing [place] to be a part of your care.

## 2021-12-01 ENCOUNTER — Observation Stay (HOSPITAL_COMMUNITY)
Admission: EM | Admit: 2021-12-01 | Discharge: 2021-12-02 | Disposition: A | Discharge status: Home / Self Care | Payer: Managed Care, Other (non HMO) | Attending: Internal Medicine | Admitting: Internal Medicine

## 2021-12-01 ENCOUNTER — Encounter (HOSPITAL_COMMUNITY): Payer: Self-pay

## 2021-12-01 ENCOUNTER — Other Ambulatory Visit: Payer: Self-pay

## 2021-12-01 DIAGNOSIS — E1140 Type 2 diabetes mellitus with diabetic neuropathy, unspecified: Secondary | ICD-10-CM | POA: Diagnosis present

## 2021-12-01 DIAGNOSIS — I10 Essential (primary) hypertension: Secondary | ICD-10-CM | POA: Diagnosis not present

## 2021-12-01 DIAGNOSIS — Z720 Tobacco use: Secondary | ICD-10-CM | POA: Diagnosis present

## 2021-12-01 DIAGNOSIS — E1040 Type 1 diabetes mellitus with diabetic neuropathy, unspecified: Secondary | ICD-10-CM | POA: Insufficient documentation

## 2021-12-01 DIAGNOSIS — Z20822 Contact with and (suspected) exposure to covid-19: Secondary | ICD-10-CM | POA: Diagnosis not present

## 2021-12-01 DIAGNOSIS — E10649 Type 1 diabetes mellitus with hypoglycemia without coma: Secondary | ICD-10-CM | POA: Diagnosis not present

## 2021-12-01 DIAGNOSIS — R55 Syncope and collapse: Principal | ICD-10-CM | POA: Diagnosis present

## 2021-12-01 DIAGNOSIS — E1010 Type 1 diabetes mellitus with ketoacidosis without coma: Secondary | ICD-10-CM | POA: Diagnosis not present

## 2021-12-01 DIAGNOSIS — Z794 Long term (current) use of insulin: Secondary | ICD-10-CM | POA: Insufficient documentation

## 2021-12-01 DIAGNOSIS — Z79899 Other long term (current) drug therapy: Secondary | ICD-10-CM | POA: Diagnosis not present

## 2021-12-01 DIAGNOSIS — E162 Hypoglycemia, unspecified: Secondary | ICD-10-CM | POA: Diagnosis present

## 2021-12-01 DIAGNOSIS — F17210 Nicotine dependence, cigarettes, uncomplicated: Secondary | ICD-10-CM | POA: Diagnosis not present

## 2021-12-01 DIAGNOSIS — Z91148 Patient's other noncompliance with medication regimen for other reason: Secondary | ICD-10-CM

## 2021-12-01 LAB — CBC WITH DIFFERENTIAL/PLATELET
Abs Immature Granulocytes: 0.05 10*3/uL (ref 0.00–0.07)
Basophils Absolute: 0.1 10*3/uL (ref 0.0–0.1)
Basophils Relative: 1 %
Eosinophils Absolute: 0.1 10*3/uL (ref 0.0–0.5)
Eosinophils Relative: 0 %
HCT: 45.2 % (ref 39.0–52.0)
Hemoglobin: 14.2 g/dL (ref 13.0–17.0)
Immature Granulocytes: 0 %
Lymphocytes Relative: 13 %
Lymphs Abs: 1.6 10*3/uL (ref 0.7–4.0)
MCH: 28.8 pg (ref 26.0–34.0)
MCHC: 31.4 g/dL (ref 30.0–36.0)
MCV: 91.7 fL (ref 80.0–100.0)
Monocytes Absolute: 0.6 10*3/uL (ref 0.1–1.0)
Monocytes Relative: 5 %
Neutro Abs: 9.7 10*3/uL — ABNORMAL HIGH (ref 1.7–7.7)
Neutrophils Relative %: 81 %
Platelets: 338 10*3/uL (ref 150–400)
RBC: 4.93 MIL/uL (ref 4.22–5.81)
RDW: 13.5 % (ref 11.5–15.5)
WBC: 12 10*3/uL — ABNORMAL HIGH (ref 4.0–10.5)
nRBC: 0 % (ref 0.0–0.2)

## 2021-12-01 LAB — COMPREHENSIVE METABOLIC PANEL
ALT: 12 U/L (ref 0–44)
AST: 12 U/L — ABNORMAL LOW (ref 15–41)
Albumin: 3.9 g/dL (ref 3.5–5.0)
Alkaline Phosphatase: 63 U/L (ref 38–126)
Anion gap: 8 (ref 5–15)
BUN: 32 mg/dL — ABNORMAL HIGH (ref 6–20)
CO2: 26 mmol/L (ref 22–32)
Calcium: 9.2 mg/dL (ref 8.9–10.3)
Chloride: 107 mmol/L (ref 98–111)
Creatinine, Ser: 1.3 mg/dL — ABNORMAL HIGH (ref 0.61–1.24)
GFR, Estimated: >60 mL/min (ref >60)
Glucose, Bld: 162 mg/dL — ABNORMAL HIGH (ref 70–99)
Potassium: 3.8 mmol/L (ref 3.5–5.1)
Sodium: 141 mmol/L (ref 135–145)
Total Bilirubin: 0.9 mg/dL (ref 0.3–1.2)
Total Protein: 6.9 g/dL (ref 6.5–8.1)

## 2021-12-01 LAB — RAPID URINE DRUG SCREEN, HOSP PERFORMED
Amphetamines: NOT DETECTED
Barbiturates: NOT DETECTED
Benzodiazepines: NOT DETECTED
Cocaine: POSITIVE — AB
Opiates: NOT DETECTED
Tetrahydrocannabinol: POSITIVE — AB

## 2021-12-01 LAB — CBG MONITORING, ED
Glucose-Capillary: 85 mg/dL (ref 70–99)
Glucose-Capillary: 42 mg/dL — CL (ref 70–99)
Glucose-Capillary: 160 mg/dL — ABNORMAL HIGH (ref 70–99)
Glucose-Capillary: 38 mg/dL — CL (ref 70–99)
Glucose-Capillary: 143 mg/dL — ABNORMAL HIGH (ref 70–99)
Glucose-Capillary: 245 mg/dL — ABNORMAL HIGH (ref 70–99)

## 2021-12-01 LAB — GLUCOSE, CAPILLARY
Glucose-Capillary: 245 mg/dL — ABNORMAL HIGH (ref 70–99)
Glucose-Capillary: 194 mg/dL — ABNORMAL HIGH (ref 70–99)

## 2021-12-01 LAB — MAGNESIUM: Magnesium: 2.1 mg/dL (ref 1.7–2.4)

## 2021-12-01 LAB — SARS CORONAVIRUS 2 BY RT PCR: SARS Coronavirus 2 by RT PCR: NEGATIVE

## 2021-12-01 MED ORDER — ACETAMINOPHEN 325 MG PO TABS: 650.0000 mg | ORAL_TABLET | Freq: Four times a day (QID) | ORAL | Status: DC | PRN

## 2021-12-01 MED ORDER — HEPARIN SODIUM (PORCINE) 5000 UNIT/ML IJ SOLN
5000.0000 [IU] | Freq: Three times a day (TID) | INTRAMUSCULAR | Status: DC
  Filled 2021-12-01 (×2): qty 1

## 2021-12-01 MED ORDER — ACETAMINOPHEN 650 MG RE SUPP: 650.0000 mg | Freq: Four times a day (QID) | RECTAL | Status: DC | PRN

## 2021-12-01 MED ORDER — INSULIN ASPART 100 UNIT/ML IJ SOLN
0.0000 [IU] | Freq: Every day | INTRAMUSCULAR | Status: DC
  Administered 2021-12-01: 2 [IU] via SUBCUTANEOUS

## 2021-12-01 MED ORDER — DEXTROSE 10 % IV SOLN: INTRAVENOUS | Status: DC

## 2021-12-01 MED ORDER — INSULIN ASPART 100 UNIT/ML IJ SOLN
0.0000 [IU] | Freq: Three times a day (TID) | INTRAMUSCULAR | Status: DC
  Administered 2021-12-01: 3 [IU] via SUBCUTANEOUS
  Administered 2021-12-02: 9 [IU] via SUBCUTANEOUS
  Filled 2021-12-01: qty 1

## 2021-12-01 MED ORDER — GABAPENTIN 300 MG PO CAPS
300.0000 mg | ORAL_CAPSULE | Freq: Every day | ORAL | Status: DC
  Administered 2021-12-01: 300 mg via ORAL
  Filled 2021-12-01: qty 1

## 2021-12-01 MED ORDER — DEXTROSE 10 % IV SOLN: Freq: Once | INTRAVENOUS | Status: AC

## 2021-12-01 MED ORDER — ONDANSETRON HCL 4 MG PO TABS: 4.0000 mg | ORAL_TABLET | Freq: Four times a day (QID) | ORAL | Status: DC | PRN

## 2021-12-01 MED ORDER — LAMOTRIGINE 25 MG PO TABS
25.0000 mg | ORAL_TABLET | Freq: Two times a day (BID) | ORAL | Status: DC
  Administered 2021-12-01 – 2021-12-02 (×2): 25 mg via ORAL
  Filled 2021-12-01 (×3): qty 1

## 2021-12-01 MED ORDER — DEXTROSE 50 % IV SOLN
1.0000 | Freq: Once | INTRAVENOUS | Status: AC
  Administered 2021-12-01: 50 mL via INTRAVENOUS
  Filled 2021-12-01: qty 50

## 2021-12-01 MED ORDER — DEXTROSE IN LACTATED RINGERS 5 % IV SOLN: INTRAVENOUS | Status: DC

## 2021-12-01 MED ORDER — LACTATED RINGERS IV BOLUS
1000.0000 mL | Freq: Once | INTRAVENOUS | Status: AC
  Administered 2021-12-01: 1000 mL via INTRAVENOUS

## 2021-12-01 MED ORDER — LORAZEPAM 2 MG/ML IJ SOLN: 2.0000 mg | INTRAMUSCULAR | Status: DC | PRN

## 2021-12-01 MED ORDER — ONDANSETRON HCL 4 MG/2ML IJ SOLN: 4.0000 mg | Freq: Four times a day (QID) | INTRAMUSCULAR | Status: DC | PRN

## 2021-12-01 NOTE — ED Notes (Signed)
CBG 42 Dr. Mayra Neer notified and Dextrose 50 ordered and given. CBG now 160.

## 2021-12-01 NOTE — ED Triage Notes (Signed)
Pt. BIB [facility]. Patient's mother called EMS stating she witnessed seizure activity for approximately 5 minutes. Also notes this is the third seizure in the past month. Pt. Is a diabetic and CBG in the field was 106. Pt's mother informed EMS that he did take has insulin this morning and did not eat any breakfast. Pt. Is alert and oriented to self, place and situation but disoriented to time.

## 2021-12-01 NOTE — ED Provider Notes (Signed)
[facility] EMERGENCY DEPARTMENT Provider Note   CSN: [id number] Arrival date & time: 12/01/21  0913     History  Chief Complaint  Patient presents with   Seizures    Pedro [name] is a 26 y.o. male with T1 DM with history DKA, polysubstance dependence, substance-induced mood disorder, history noncompliance, history of spontaneous pneumothorax, presents with seizure.   Pt woke this AM and took his insulin, did not eat, and acutely became lightheaded with some mild headache, vision went black, and he lost consciousness. Unclear how long he was unconscious for. States he woke up in the bed, denies hitting his head. Currently feels well, thinks he potentially was hypoglycemic, does not have any pain, nausea, lightheadedness, CP, palpitations. Was in his [place] until today other than another episode similar to this just a few days ago. States this is exactly the same thing that happened then. He has not had any f/c, leg swelling, recent hospitalizations, double vision, blurry vision, rectal bleeding. States he feels some cough/congestion/cold symptoms for a few days but no SOB.  Patient reports no loss of urine or tongue biting, did not have any period of confusion after he woke up, states he felt mostly normal when he woke up.  Per chart review, patient was seen at [facility] emergency department on 11/29/2021 for syncopal episode versus seizure activity. Syncope was favored over seizure and on further history patient reported that this happens every few months or so.   HPI     Home Medications Prior to Admission medications   Medication Sig Start Date End Date Taking? Authorizing Provider  blood glucose meter kit and supplies Dispense based on patient and insurance preference. Use up to four times daily as directed. (FOR ICD-10 E10.9, E11.9). 07/01/21   Johnson, Clanford L, MD  gabapentin (NEURONTIN) 300 MG capsule Take 1 capsule (300 mg total) by mouth at bedtime. 07/01/21   Johnson, Clanford L,  MD  insulin NPH-regular Human (70-30) 100 UNIT/ML injection Inject 12 Units into the skin 2 (two) times daily with a meal. 07/01/21   Johnson, Clanford L, MD  risperiDONE (RISPERDAL) 3 MG tablet Take 3 mg by mouth daily. 12/02/20   [provider]  SYMBICORT 160-4.5 MCG/ACT inhaler Inhale 2 puffs into the lungs 2 (two) times daily. 11/30/20   [provider]      Allergies    Sulfa antibiotics    Review of Systems   Review of Systems Review of systems positive for LOC.  A 10 point review of systems was performed and is negative unless otherwise reported in HPI.  Physical Exam Updated Vital Signs BP 120/88   Pulse (!) 107   Temp 97.7 F (36.5 C) (Oral)   Resp 18   Ht 5' 11" (1.803 m)   Wt 60.3 kg   SpO2 97%   BMI 18.55 kg/m  Physical Exam General: Normal appearing male, lying in bed.  HEENT: PERRLA midrange, EOMI, Sclera anicteric, MMM, trachea midline, no JVD, no midline C-spine tenderness or step-offs.  Cardiology: Sinus tachycardia, no murmurs/rubs/gallops. BL radial and DP pulses equal bilaterally.  Resp: Normal respiratory rate and effort. CTAB, no wheezes, rhonchi, crackles.  Abd: Soft, non-tender, non-distended. No rebound tenderness or guarding.  GU: Deferred. MSK: No peripheral edema or signs of trauma. Extremities without deformity or TTP. No cyanosis or clubbing. Skin: warm, dry. No rashes or lesions. Back: No CVA tenderness Neuro: A&Ox4, CNs II-XII grossly intact. MAEs. Sensation grossly intact.  Psych: Normal mood and affect.  ED Results / Procedures / Treatments   Labs (all labs ordered are listed, but only abnormal results are displayed) Labs Reviewed  SARS CORONAVIRUS 2 BY RT PCR  CBC WITH DIFFERENTIAL/PLATELET  COMPREHENSIVE METABOLIC PANEL  MAGNESIUM  CBG MONITORING, ED    EKG EKG Interpretation  Date/Time:  Wednesday December 01 2021 09:22:16 EDT Ventricular Rate:  115 PR Interval:  136 QRS Duration: 103 QT Interval:  343 QTC  Calculation: 475 R Axis:   97 Text Interpretation: Sinus tachycardia Right atrial enlargement Borderline right axis deviation Borderline prolonged QT interval Artifact in lead(s) I II aVR aVL Confirmed by Cindee Lame [phone]) on 12/01/2021 9:34:53 AM  Radiology CT HEAD WO CONTRAST (5MM)  Result Date: 11/29/2021 CLINICAL DATA:  Possible seizure EXAM: CT HEAD WITHOUT CONTRAST TECHNIQUE: Contiguous axial images were obtained from the base of the skull through the vertex without intravenous contrast. RADIATION DOSE REDUCTION: This exam was performed according to the departmental dose-optimization program which includes automated exposure control, adjustment of the mA and/or kV according to patient size and/or use of iterative reconstruction technique. COMPARISON:  10/30/2013 FINDINGS: Brain: No evidence of acute infarction, hemorrhage, mass, mass effect, or midline shift. No hydrocephalus or extra-axial fluid collection. Vascular: No hyperdense vessel. Skull: Normal. Negative for fracture or focal lesion. Sinuses/Orbits: Mucosal thickening in the imaged left maxillary sinus and bilateral ethmoid air cells and inferior frontal sinuses. The orbits are unremarkable. Other: The mastoid air cells are well aerated. IMPRESSION: No acute intracranial process. Electronically Signed   By: Merilyn Baba M.D.   On: 11/29/2021 23:56    Procedures .Critical Care  Performed by: Audley Hose, MD Authorized by: Audley Hose, MD   Critical care provider statement:    Critical care time (minutes):  35   Critical care was necessary to treat or prevent imminent or life-threatening deterioration of the following conditions:  Endocrine crisis   Critical care was time spent personally by me on the following activities:  Development of treatment plan with patient or surrogate, discussions with consultants, evaluation of patient's response to treatment, examination of patient, ordering and review of laboratory studies,  ordering and review of radiographic studies, ordering and performing treatments and interventions, pulse oximetry, re-evaluation of patient's condition, review of old charts and obtaining history from patient or surrogate   Care discussed with: admitting provider       Medications Ordered in ED Medications  lactated ringers bolus 1,000 mL (0 mLs Intravenous Stopped 12/01/21 1124)  dextrose 50 % solution 50 mL (50 mLs Intravenous Given 12/01/21 1117)  dextrose 10 % infusion ( Intravenous New Bag/Given 12/01/21 1421)    ED Course/ Medical Decision Making/ A&P                          Medical Decision Making Amount and/or Complexity of Data Reviewed Labs: ordered. Decision-making details documented in ED Course.  Risk Prescription drug management. Decision regarding hospitalization.    For this patient's syncope, consider:  Given history, clinical suspicion for syncope is greater than that of seizure, given prodrome of lightheadedness and vision going black w/ no postictal period or loss of urine. ED physician on 9/25 also felt similarly w/ same clinical picture. Patient now w/ no headache or FNDs, very low c/f SAH or CVA. Consider a hypoglycemic episode given report by patient but EMS BG was 102 and here is 85 mg/dL. Will recheck in 1h. Pt is tachycardic, consider dehydration vs orthostatic syncope,  will obtain BMP and orthostatic vitals. No report of bleeding, will obtain CBC to evaluate for anemia. Consider arrhythmia and EKG demonstrates sinus tach with borderline prolonged QT. No CP/SOB to indicate ACS and patient is asymptomatic at this time w/ no ischemic changes on EKG, low concern. No leg swelling/hypoxia/SOB to indicate DVT, well's criteria neg, no recent surgery/hospitalization. Will obtain labs and monitor patient's BG.   I have personally reviewed and interpreted all labs and imaging.   Clinical Course as of 12/01/21 1525  Wed Dec 01, 2021  0929 Glucose-Capillary: 85 Was 102  mg/dL w/ EMS. Will test it again in 1h. [HN]  1114 Glucose-Capillary(!!): 42 Will give 1 amp D50 [HN]  1328 WBC(!): 12.0 [HN]  1328 Creatinine(!): 1.30 Slightly elevated from BL 1.1; received 1L LR bolus [HN]  1412 Glucose-Capillary(!!): 38 [HN]  1421 Repeat glucose 38 after amp of D50. Patient more alert than this AM. States he actually took less insulin this morning than he usually takes (20U as opposed to 30U). In spite of D50 amp and oral intake, patient continuing to become hypoglycemic. Potentially needs adjustments to his insulin regimen, especially as this is the 2nd time he has presented with this. Will start D10 drip and consult to medicine. [HN]    Clinical Course User Index [HN] Audley Hose, MD          Final Clinical Impression(s) / ED Diagnoses Final diagnoses:  Hypoglycemia  Syncope, unspecified syncope type    Rx / DC Orders ED Discharge Orders     None        This note was created using dictation software, which may contain spelling or grammatical errors.    Audley Hose, MD 12/01/21 [phone]

## 2021-12-01 NOTE — H&P (Signed)
History and Physical    Pedro [name] QBH:[id number] DOB: [birth year] DOA: 12/01/2021  PCP: Neale Burly, MD   Patient coming from: Home  Chief Complaint: AMS/Seizure  HPI: Pedro [name] is a 26 y.o. male with medical history significant for type 1 diabetes, history of noncompliance, hypertension, diabetic neuropathy who presented to the ED with what appears to be an episode of seizure activity that lasted approximately 5 minutes.  Apparently this is his third "seizure activity" in the last month.  He woke up this morning and took his insulin, but did not eat and then acutely became lightheaded and had a mild headache and lost consciousness.  He denies any headaches, changes to his vision, weakness to his upper or lower extremities, incontinence, or tongue biting.  He was noted to be confused after he woke up in his bed, but otherwise felt okay.  He was seen in the ED 9/25 for syncope versus seizure activity and syncope was favored at that time and appeared to be related to his hypoglycemia.   ED Course: Vital signs stable and patient afebrile.  Leukocytosis of 12,000 noted with BUN 32 and creatinine 1.3.  CT head with no acute findings.  He was noted to be hypoglycemic and started on D10 infusion.  Review of Systems: Reviewed as noted above, otherwise negative.  Past Medical History:  Diagnosis Date   ADHD (attention deficit hyperactivity disorder)    DM type 1 (diabetes mellitus, type 1) ([place])    diagnosed age 83   Herpes    High cholesterol    Hypertension    Noncompliance with medications     Past Surgical History:  Procedure Laterality Date   INTERCOSTAL NERVE BLOCK Right 12/10/2020   Procedure: INTERCOSTAL NERVE BLOCK;  Surgeon: Lajuana Matte, MD;  Location: [place];  Service: Thoracic;  Laterality: Right;   none     PLEURADESIS Right 12/10/2020   Procedure: MECHANICAL PLEURADESIS;  Surgeon: Lajuana Matte, MD;  Location: [place];  Service: Thoracic;   Laterality: Right;   PLEURECTOMY Right 12/10/2020   Procedure: APICAL PLEURECTOMY;  Surgeon: Lajuana Matte, MD;  Location: [place];  Service: Thoracic;  Laterality: Right;     reports that he has been smoking cigarettes. He has a 5.00 pack-year smoking history. He has never used smokeless tobacco. He reports that he does not currently use drugs after having used the following drugs: Marijuana, Other-see comments, and Benzodiazepines. He reports that he does not drink alcohol.  Allergies  Allergen Reactions   Sulfa Antibiotics Hives and Other (See Comments)    Reaction:  Fever     Family History  Problem Relation Age of Onset   Diabetes Mellitus I Paternal Grandmother    Diabetes Mellitus I Paternal Aunt    Anxiety disorder Mother    Hypertension Mother    Hypertension Father     Prior to Admission medications   Medication Sig Start Date End Date Taking? Authorizing Provider  gabapentin (NEURONTIN) 300 MG capsule Take 1 capsule (300 mg total) by mouth at bedtime. 07/01/21  Yes Johnson, Clanford L, MD  insulin NPH-regular Human (70-30) 100 UNIT/ML injection Inject 12 Units into the skin 2 (two) times daily with a meal. 07/01/21  Yes Johnson, Clanford L, MD  lamoTRIgine (LAMICTAL) 25 MG tablet Take 25 mg by mouth 2 (two) times daily. 11/15/21  Yes [provider]  lisinopril (ZESTRIL) 10 MG tablet Take 10 mg by mouth daily. 11/15/21  Yes [provider]  blood glucose meter kit and supplies Dispense based on patient and insurance preference. Use up to four times daily as directed. (FOR ICD-10 E10.9, E11.9). 07/01/21   Murlean Iba, MD    Physical Exam: Vitals:   12/01/21 1245 12/01/21 1300 12/01/21 1330 12/01/21 1500  BP:  130/84 127/82 121/85  Pulse: 84 80 74 80  Resp:  16 14   Temp:      TempSrc:      SpO2: 97% 96% 99% 98%  Weight:      Height:        Constitutional: NAD, calm, comfortable Vitals:   12/01/21 1245 12/01/21 1300 12/01/21 1330  12/01/21 1500  BP:  130/84 127/82 121/85  Pulse: 84 80 74 80  Resp:  16 14   Temp:      TempSrc:      SpO2: 97% 96% 99% 98%  Weight:      Height:       Eyes: lids and conjunctivae normal Neck: normal, supple Respiratory: clear to auscultation bilaterally. Normal respiratory effort. No accessory muscle use.  Cardiovascular: Regular rate and rhythm, no murmurs. Abdomen: no tenderness, no distention. Bowel sounds positive.  Musculoskeletal:  No edema. Skin: no rashes, lesions, ulcers.  Psychiatric: Flat affect  Labs on Admission: I have personally reviewed following labs and imaging studies  CBC: Recent Labs  Lab 11/29/21 2346 12/01/21 1150  WBC 13.4* 12.0*  NEUTROABS 9.0* 9.7*  HGB 13.5 14.2  HCT 43.0 45.2  MCV 93.5 91.7  PLT 278 161   Basic Metabolic Panel: Recent Labs  Lab 11/29/21 2346 12/01/21 1150  NA 140 141  K 4.0 3.8  CL 107 107  CO2 25 26  GLUCOSE 219* 162*  BUN 20 32*  CREATININE 1.11 1.30*  CALCIUM 8.9 9.2  MG  --  2.1   GFR: Estimated Creatinine Clearance: 72.2 mL/min (A) (by C-G formula based on SCr of 1.3 mg/dL (H)). Liver Function Tests: Recent Labs  Lab 11/29/21 2346 12/01/21 1150  AST 12* 12*  ALT 11 12  ALKPHOS 54 63  BILITOT 1.0 0.9  PROT 6.1* 6.9  ALBUMIN 3.4* 3.9   No results for input(s): "LIPASE", "AMYLASE" in the last 168 hours. No results for input(s): "AMMONIA" in the last 168 hours. Coagulation Profile: No results for input(s): "INR", "PROTIME" in the last 168 hours. Cardiac Enzymes: No results for input(s): "CKTOTAL", "CKMB", "CKMBINDEX", "TROPONINI" in the last 168 hours. BNP (last 3 results) No results for input(s): "PROBNP" in the last 8760 hours. HbA1C: No results for input(s): "HGBA1C" in the last 72 hours. CBG: Recent Labs  Lab 11/29/21 2012 12/01/21 0926 12/01/21 1110 12/01/21 1143 12/01/21 1401  GLUCAP 83 85 42* 160* 38*   Lipid Profile: No results for input(s): "CHOL", "HDL", "LDLCALC", "TRIG",  "CHOLHDL", "LDLDIRECT" in the last 72 hours. Thyroid Function Tests: No results for input(s): "TSH", "T4TOTAL", "FREET4", "T3FREE", "THYROIDAB" in the last 72 hours. Anemia Panel: No results for input(s): "VITAMINB12", "FOLATE", "FERRITIN", "TIBC", "IRON", "RETICCTPCT" in the last 72 hours. Urine analysis:    Component Value Date/Time   COLORURINE STRAW (A) 06/30/2021 2150   APPEARANCEUR CLEAR 06/30/2021 2150   LABSPEC 1.021 06/30/2021 2150   PHURINE 6.0 06/30/2021 2150   GLUCOSEU >=500 (A) 06/30/2021 2150   HGBUR SMALL (A) 06/30/2021 2150   BILIRUBINUR NEGATIVE 06/30/2021 2150   BILIRUBINUR neg 04/16/2014 1752   KETONESUR 20 (A) 06/30/2021 2150   PROTEINUR NEGATIVE 06/30/2021 2150   UROBILINOGEN 0.2 10/17/2014 0743   NITRITE  NEGATIVE 06/30/2021 2150   LEUKOCYTESUR NEGATIVE 06/30/2021 2150    Radiological Exams on Admission: CT HEAD WO CONTRAST (5MM)  Result Date: 11/29/2021 CLINICAL DATA:  Possible seizure EXAM: CT HEAD WITHOUT CONTRAST TECHNIQUE: Contiguous axial images were obtained from the base of the skull through the vertex without intravenous contrast. RADIATION DOSE REDUCTION: This exam was performed according to the departmental dose-optimization program which includes automated exposure control, adjustment of the mA and/or kV according to patient size and/or use of iterative reconstruction technique. COMPARISON:  10/30/2013 FINDINGS: Brain: No evidence of acute infarction, hemorrhage, mass, mass effect, or midline shift. No hydrocephalus or extra-axial fluid collection. Vascular: No hyperdense vessel. Skull: Normal. Negative for fracture or focal lesion. Sinuses/Orbits: Mucosal thickening in the imaged left maxillary sinus and bilateral ethmoid air cells and inferior frontal sinuses. The orbits are unremarkable. Other: The mastoid air cells are well aerated. IMPRESSION: No acute intracranial process. Electronically Signed   By: Merilyn Baba M.D.   On: 11/29/2021 23:56    EKG:  Independently reviewed. ST 115bpm.  Assessment/Plan Principal Problem:   Syncope Active Problems:   Tobacco abuse   Diabetic neuropathy (HCC)   Noncompliance with medications   Hypoglycemia    Syncope versus seizure related to hypoglycemia Continue D10 infusion and monitor blood glucose readings As noted below, does not appear to be compliant with his home medications or from a dietary standpoint as he has had ED visits and admissions similar in the past Check EEG Seizure precautions  History of type 1 diabetes Appears to have some noncompliance related to this Continue D10 infusion SSI Carb modified diet  Diabetic neuropathy Continue gabapentin  Hypertension Hold home lisinopril for now  History of medication/dietary noncompliance  History of tobacco abuse Encouraged cessation Smokes 1 pack/day  DVT prophylaxis: Heparin Code Status: Full Family Communication: None Disposition Plan:Admit for hypoglycemia/seizure vs syncope evaluation Consults called:None Admission status: Obs, SDU  Severity of Illness: The appropriate patient status for this patient is OBSERVATION. Observation status is judged to be reasonable and necessary in order to provide the required intensity of service to ensure the patient's safety. The patient's presenting symptoms, physical exam findings, and initial radiographic and laboratory data in the context of their medical condition is felt to place them at decreased risk for further clinical deterioration. Furthermore, it is anticipated that the patient will be medically stable for discharge from the hospital within 2 midnights of admission.    Alyissa Whidbee D Manuella Ghazi DO [facility]  If 7PM-7AM, please contact night-coverage [email or web address]  12/01/2021, 3:17 PM

## 2021-12-02 DIAGNOSIS — R55 Syncope and collapse: Secondary | ICD-10-CM | POA: Diagnosis not present

## 2021-12-02 LAB — BASIC METABOLIC PANEL
Anion gap: 5 (ref 5–15)
BUN: 23 mg/dL — ABNORMAL HIGH (ref 6–20)
CO2: 25 mmol/L (ref 22–32)
Calcium: 8.2 mg/dL — ABNORMAL LOW (ref 8.9–10.3)
Chloride: 108 mmol/L (ref 98–111)
Creatinine, Ser: 1.1 mg/dL (ref 0.61–1.24)
GFR, Estimated: >60 mL/min (ref >60)
Glucose, Bld: 219 mg/dL — ABNORMAL HIGH (ref 70–99)
Potassium: 3.4 mmol/L — ABNORMAL LOW (ref 3.5–5.1)
Sodium: 138 mmol/L (ref 135–145)

## 2021-12-02 LAB — CBC
HCT: 42.8 % (ref 39.0–52.0)
Hemoglobin: 13.6 g/dL (ref 13.0–17.0)
MCH: 29 pg (ref 26.0–34.0)
MCHC: 31.8 g/dL (ref 30.0–36.0)
MCV: 91.3 fL (ref 80.0–100.0)
Platelets: 308 10*3/uL (ref 150–400)
RBC: 4.69 MIL/uL (ref 4.22–5.81)
RDW: 13.6 % (ref 11.5–15.5)
WBC: 9.7 10*3/uL (ref 4.0–10.5)
nRBC: 0 % (ref 0.0–0.2)

## 2021-12-02 LAB — GLUCOSE, CAPILLARY
Glucose-Capillary: 372 mg/dL — ABNORMAL HIGH (ref 70–99)
Glucose-Capillary: 421 mg/dL — ABNORMAL HIGH (ref 70–99)

## 2021-12-02 LAB — MAGNESIUM: Magnesium: 1.7 mg/dL (ref 1.7–2.4)

## 2021-12-02 MED ORDER — INSULIN NPH ISOPHANE & REGULAR (70-30) 100 UNIT/ML ~~LOC~~ SUSP: 10.0000 [IU] | Freq: Two times a day (BID) | SUBCUTANEOUS | 11 refills | Status: DC

## 2021-12-02 MED ORDER — INSULIN ASPART 100 UNIT/ML IJ SOLN
12.0000 [IU] | Freq: Once | INTRAMUSCULAR | Status: AC
  Administered 2021-12-02: 12 [IU] via SUBCUTANEOUS

## 2021-12-02 MED ORDER — POTASSIUM CHLORIDE CRYS ER 20 MEQ PO TBCR
40.0000 meq | EXTENDED_RELEASE_TABLET | Freq: Once | ORAL | Status: AC
  Administered 2021-12-02: 40 meq via ORAL
  Filled 2021-12-02: qty 2

## 2021-12-02 MED ORDER — INSULIN ASPART PROT & ASPART (70-30 MIX) 100 UNIT/ML ~~LOC~~ SUSP
6.0000 [IU] | Freq: Two times a day (BID) | SUBCUTANEOUS | Status: DC
  Administered 2021-12-02: 6 [IU] via SUBCUTANEOUS
  Filled 2021-12-02: qty 10

## 2021-12-02 NOTE — Inpatient Diabetes Management (Signed)
Inpatient Diabetes Program Recommendations  AACE/ADA: New Consensus Statement on Inpatient Glycemic Control   Target Ranges:  Prepandial:   less than 140 mg/dL      Peak postprandial:   less than 180 mg/dL (1-2 hours)      Critically ill patients:  140 - 180 mg/dL    Latest Reference Range & Units 12/02/21 07:20  Glucose-Capillary 70 - 99 mg/dL 372 (H)    Latest Reference Range & Units 12/01/21 09:26 12/01/21 11:10 12/01/21 11:43 12/01/21 14:01 12/01/21 15:32 12/01/21 17:11 12/01/21 21:11 12/01/21 21:44  Glucose-Capillary 70 - 99 mg/dL 85 42 (LL) 160 (H) 38 (LL) 143 (H) 245 (H) 245 (H) 194 (H)   Review of Glycemic Control  Diabetes history: DM1 (does NOT make any insulin; requires basal, correction, and carbohydrate coverage insulin) Outpatient Diabetes medications: 70/30 12 units BID Current orders for Inpatient glycemic control:Novolog 0-9 units TID with meals, Novolog 0-5 units QHS  Inpatient Diabetes Program Recommendations:    Insulin: Please consider ordering 70/30 6 units BID (to start at 8am).  IV fluids: Please re-evaluate dextrose in IV fluids.  NOTE: Patient has hx of DM1 and was last inpatient 08/19/21-08/19/21. Inpatient diabetes coordinator spoke with patient on 08/19/21 during last admission regarding. Patient admitted on 12/01/21 with syncope versus seizure related to hypoglycemia.   Thanks, Barnie Alderman, RN, MSN, [place] [facility] Coordinator Inpatient Diabetes Program [phone] (Team Pager from 8am to [place])

## 2021-12-02 NOTE — TOC Progression Note (Signed)
  Transition of Care [facility]) Screening Note   Patient Details  Name: Pedro [name] Date of Birth: December 17, 1995   [facility]) CM/SW Contact:    Boneta Lucks, RN Phone Number: 12/02/2021, 11:48 AM    Transition of Care Department [facility]) has reviewed patient and no TOC needs have been identified at this time. We will continue to monitor patient advancement through interdisciplinary progression rounds. If new patient transition needs arise, please place a TOC consult.     Expected Discharge Plan: Home/Self Care Barriers to Discharge: No Barriers Identified  Expected Discharge Plan and Services Expected Discharge Plan: Home/Self Care         Expected Discharge Date: 12/02/21

## 2021-12-02 NOTE — Discharge Summary (Signed)
Physician Discharge Summary  Pedro [name] XBL:[id number] DOB: February 09, 1996 DOA: 12/01/2021  PCP: Neale Burly, MD  Admit date: 12/01/2021  Discharge date: 12/02/2021  Admitted From:Home  Disposition:  Home  Recommendations for Outpatient Follow-up:  Follow up with PCP in 1-2 weeks Continue now on 70/30 insulin at reduced dose of 10 units twice daily Continue other home medications as prior Patient likely to remain noncompliant with diet and medications and has high risk for readmission  Home Health: None  Equipment/Devices: None  Discharge Condition:Stable  CODE STATUS: Full  Diet recommendation: Heart Healthy/carb modified  Brief/Interim Summary: Pedro [name] is a 26 y.o. male with medical history significant for type 1 diabetes, history of noncompliance, hypertension, diabetic neuropathy who presented to the ED with what appears to be an episode of seizure activity that lasted approximately 5 minutes.  Apparently this is his third "seizure activity" in the last month.  He woke up this morning and took his insulin, but did not eat and then acutely became lightheaded and had a mild headache and lost consciousness.  Patient was admitted for concerns of syncope versus seizure related to hypoglycemia and was initially started on D10 infusion.  His blood glucose levels have improved and stabilized and he is tolerating diet.  He is eager for discharge and it has been explained to him that he needs to eat routinely especially with insulin use and monitor his blood sugars closely.  It is uncertain whether he will remain compliant and he does remain a very high risk for readmission.  No other acute events noted throughout the course of the stay and he is stable for discharge.  Discharge Diagnoses:  Principal Problem:   Syncope Active Problems:   Tobacco abuse   Diabetic neuropathy ([place])   Noncompliance with medications   Hypoglycemia  Principal discharge diagnosis: Syncope  related to hypoglycemia due to dietary and medication noncompliance in the setting of type 1 diabetes.  Discharge Instructions  Discharge Instructions     Diet - low sodium heart healthy   Complete by: As directed    Increase activity slowly   Complete by: As directed       Allergies as of 12/02/2021       Reactions   Sulfa Antibiotics Hives, Other (See Comments)   Reaction:  Fever        Medication List     TAKE these medications    blood glucose meter kit and supplies Dispense based on patient and insurance preference. Use up to four times daily as directed. (FOR ICD-10 E10.9, E11.9).   gabapentin 300 MG capsule Commonly known as: NEURONTIN Take 1 capsule (300 mg total) by mouth at bedtime.   insulin NPH-regular Human (70-30) 100 UNIT/ML injection Inject 10 Units into the skin 2 (two) times daily with a meal. What changed: how much to take   lamoTRIgine 25 MG tablet Commonly known as: LAMICTAL Take 25 mg by mouth 2 (two) times daily.   lisinopril 10 MG tablet Commonly known as: ZESTRIL Take 10 mg by mouth daily.        Follow-up Information     Neale Burly, MD. Schedule an appointment as soon as possible for a visit in 1 week(s).   Specialty: Internal Medicine Contact information: [place] [phone]                Allergies  Allergen Reactions   Sulfa Antibiotics Hives and Other (See Comments)  Reaction:  Fever     Consultations: None   Procedures/Studies: CT HEAD WO CONTRAST (5MM)  Result Date: 11/29/2021 CLINICAL DATA:  Possible seizure EXAM: CT HEAD WITHOUT CONTRAST TECHNIQUE: Contiguous axial images were obtained from the base of the skull through the vertex without intravenous contrast. RADIATION DOSE REDUCTION: This exam was performed according to the departmental dose-optimization program which includes automated exposure control, adjustment of the mA and/or kV according to patient size  and/or use of iterative reconstruction technique. COMPARISON:  10/30/2013 FINDINGS: Brain: No evidence of acute infarction, hemorrhage, mass, mass effect, or midline shift. No hydrocephalus or extra-axial fluid collection. Vascular: No hyperdense vessel. Skull: Normal. Negative for fracture or focal lesion. Sinuses/Orbits: Mucosal thickening in the imaged left maxillary sinus and bilateral ethmoid air cells and inferior frontal sinuses. The orbits are unremarkable. Other: The mastoid air cells are well aerated. IMPRESSION: No acute intracranial process. Electronically Signed   By: Merilyn Baba M.D.   On: 11/29/2021 23:56     Discharge Exam: Vitals:   12/02/21 0218 12/02/21 0612  BP: 118/76 121/83  Pulse: 71 73  Resp: 17 18  Temp: 98 F (36.7 C) 98.5 F (36.9 C)  SpO2: 100% 100%   Vitals:   12/01/21 1826 12/01/21 2144 12/02/21 0218 12/02/21 0612  BP: (!) 131/95 122/85 118/76 121/83  Pulse: 91 75 71 73  Resp:  _0 Temp: 97.7 F (36.5 C) 98.2 F (36.8 C) 98 F (36.7 C) 98.5 F (36.9 C)  TempSrc:  Oral Oral Oral  SpO2: 100% 100% 100% 100%  Weight:      Height:        General: Pt is alert, awake, not in acute distress Cardiovascular: RRR, S1/S2 +, no rubs, no gallops Respiratory: CTA bilaterally, no wheezing, no rhonchi Abdominal: Soft, NT, ND, bowel sounds + Extremities: no edema, no cyanosis    The results of significant diagnostics from this hospitalization (including imaging, microbiology, ancillary and laboratory) are listed below for reference.     Microbiology: Recent Results (from the past 240 hour(s))  SARS Coronavirus 2 by RT PCR (hospital order, performed in [facility] hospital lab) *cepheid single result test* Anterior Nasal Swab     Status: None   Collection Time: 12/01/21  9:35 AM   Specimen: Anterior Nasal Swab  Result Value Ref Range Status   SARS Coronavirus 2 by RT PCR NEGATIVE NEGATIVE Final    Comment: (NOTE) SARS-CoV-2 target nucleic acids are  NOT DETECTED.  The SARS-CoV-2 RNA is generally detectable in upper and lower respiratory specimens during the acute phase of infection. The lowest concentration of SARS-CoV-2 viral copies this assay can detect is 250 copies / mL. A negative result does not preclude SARS-CoV-2 infection and should not be used as the sole basis for treatment or other patient management decisions.  A negative result may occur with improper specimen collection / handling, submission of specimen other than nasopharyngeal swab, presence of viral mutation(s) within the areas targeted by this assay, and inadequate number of viral copies (<250 copies / mL). A negative result must be combined with clinical observations, patient history, and epidemiological information.  Fact Sheet for Patients:   https://www.patel.info/  Fact Sheet for Healthcare Providers: https://hall.com/  This test is not yet approved or  cleared by the [place] FDA and has been authorized for detection and/or diagnosis of SARS-CoV-2 by FDA under an Emergency Use Authorization (EUA).  This EUA will remain in effect (meaning this test can be used)  for the duration of the COVID-19 declaration under Section 564(b)(1) of the Act, 21 U.S.C. section 360bbb-3(b)(1), unless the authorization is terminated or revoked sooner.  Performed at [facility], [address] [place], [place] [address]      Labs: BNP (last 3 results) No results for input(s): "BNP" in the last 8760 hours. Basic Metabolic Panel: Recent Labs  Lab 11/29/21 2346 12/01/21 1150 12/02/21 0408  NA 140 141 138  K 4.0 3.8 3.4*  CL 107 107 108  CO2 _0 GLUCOSE 219* 162* 219*  BUN 20 32* 23*  CREATININE 1.11 1.30* 1.10  CALCIUM 8.9 9.2 8.2*  MG  --  2.1 1.7   Liver Function Tests: Recent Labs  Lab 11/29/21 2346 12/01/21 1150  AST 12* 12*  ALT 11 12  ALKPHOS 54 63  BILITOT 1.0 0.9  PROT 6.1* 6.9  ALBUMIN  3.4* 3.9   No results for input(s): "LIPASE", "AMYLASE" in the last 168 hours. No results for input(s): "AMMONIA" in the last 168 hours. CBC: Recent Labs  Lab 11/29/21 2346 12/01/21 1150 12/02/21 0408  WBC 13.4* 12.0* 9.7  NEUTROABS 9.0* 9.7*  --   HGB 13.5 14.2 13.6  HCT 43.0 45.2 42.8  MCV 93.5 91.7 91.3  PLT 278 338 308   Cardiac Enzymes: No results for input(s): "CKTOTAL", "CKMB", "CKMBINDEX", "TROPONINI" in the last 168 hours. BNP: Invalid input(s): "POCBNP" CBG: Recent Labs  Lab 12/01/21 1532 12/01/21 1711 12/01/21 2111 12/01/21 2144 12/02/21 0720  GLUCAP 143* 245* 245* 194* 372*   D-Dimer No results for input(s): "DDIMER" in the last 72 hours. Hgb A1c No results for input(s): "HGBA1C" in the last 72 hours. Lipid Profile No results for input(s): "CHOL", "HDL", "LDLCALC", "TRIG", "CHOLHDL", "LDLDIRECT" in the last 72 hours. Thyroid function studies No results for input(s): "TSH", "T4TOTAL", "T3FREE", "THYROIDAB" in the last 72 hours.  Invalid input(s): "FREET3" Anemia work up No results for input(s): "VITAMINB12", "FOLATE", "FERRITIN", "TIBC", "IRON", "RETICCTPCT" in the last 72 hours. Urinalysis    Component Value Date/Time   COLORURINE STRAW (A) 06/30/2021 2150   APPEARANCEUR CLEAR 06/30/2021 2150   LABSPEC 1.021 06/30/2021 2150   PHURINE 6.0 06/30/2021 2150   GLUCOSEU >=500 (A) 06/30/2021 2150   HGBUR SMALL (A) 06/30/2021 2150   BILIRUBINUR NEGATIVE 06/30/2021 2150   BILIRUBINUR neg 04/16/2014 1752   KETONESUR 20 (A) 06/30/2021 2150   PROTEINUR NEGATIVE 06/30/2021 2150   UROBILINOGEN 0.2 10/17/2014 0743   NITRITE NEGATIVE 06/30/2021 2150   LEUKOCYTESUR NEGATIVE 06/30/2021 2150   Sepsis Labs Recent Labs  Lab 11/29/21 2346 12/01/21 1150 12/02/21 0408  WBC 13.4* 12.0* 9.7   Microbiology Recent Results (from the past 240 hour(s))  SARS Coronavirus 2 by RT PCR (hospital order, performed in [place] hospital lab) *cepheid single result test*  Anterior Nasal Swab     Status: None   Collection Time: 12/01/21  9:35 AM   Specimen: Anterior Nasal Swab  Result Value Ref Range Status   SARS Coronavirus 2 by RT PCR NEGATIVE NEGATIVE Final    Comment: (NOTE) SARS-CoV-2 target nucleic acids are NOT DETECTED.  The SARS-CoV-2 RNA is generally detectable in upper and lower respiratory specimens during the acute phase of infection. The lowest concentration of SARS-CoV-2 viral copies this assay can detect is 250 copies / mL. A negative result does not preclude SARS-CoV-2 infection and should not be used as the sole basis for treatment or other patient management decisions.  A negative result may occur with  improper specimen collection / handling, submission of specimen other than nasopharyngeal swab, presence of viral mutation(s) within the areas targeted by this assay, and inadequate number of viral copies (<250 copies / mL). A negative result must be combined with clinical observations, patient history, and epidemiological information.  Fact Sheet for Patients:   https://www.patel.info/  Fact Sheet for Healthcare Providers: https://hall.com/  This test is not yet approved or  cleared by the [place] FDA and has been authorized for detection and/or diagnosis of SARS-CoV-2 by FDA under an Emergency Use Authorization (EUA).  This EUA will remain in effect (meaning this test can be used) for the duration of the COVID-19 declaration under Section 564(b)(1) of the Act, 21 U.S.C. section 360bbb-3(b)(1), unless the authorization is terminated or revoked sooner.  Performed at [facility], [address], [place], [place] [address]      Time coordinating discharge: 35 minutes  SIGNED:   Rodena Goldmann, DO [facility] 12/02/2021, 9:45 AM  If 7PM-7AM, please contact night-coverage [email or web address]

## 2021-12-25 ENCOUNTER — Emergency Department (HOSPITAL_COMMUNITY): Payer: Managed Care, Other (non HMO)

## 2021-12-25 ENCOUNTER — Inpatient Hospital Stay (HOSPITAL_COMMUNITY)
Admission: EM | Admit: 2021-12-25 | Discharge: 2021-12-26 | DRG: 638 | Discharge status: Against Medical Advice | Payer: Managed Care, Other (non HMO) | Attending: Internal Medicine | Admitting: Internal Medicine

## 2021-12-25 ENCOUNTER — Encounter (HOSPITAL_COMMUNITY): Payer: Self-pay

## 2021-12-25 ENCOUNTER — Other Ambulatory Visit: Payer: Self-pay

## 2021-12-25 DIAGNOSIS — F909 Attention-deficit hyperactivity disorder, unspecified type: Secondary | ICD-10-CM | POA: Diagnosis present

## 2021-12-25 DIAGNOSIS — I10 Essential (primary) hypertension: Secondary | ICD-10-CM | POA: Diagnosis present

## 2021-12-25 DIAGNOSIS — Z8249 Family history of ischemic heart disease and other diseases of the circulatory system: Secondary | ICD-10-CM | POA: Diagnosis not present

## 2021-12-25 DIAGNOSIS — F1420 Cocaine dependence, uncomplicated: Secondary | ICD-10-CM | POA: Diagnosis present

## 2021-12-25 DIAGNOSIS — Z794 Long term (current) use of insulin: Secondary | ICD-10-CM | POA: Diagnosis not present

## 2021-12-25 DIAGNOSIS — R6510 Systemic inflammatory response syndrome (SIRS) of non-infectious origin without acute organ dysfunction: Secondary | ICD-10-CM

## 2021-12-25 DIAGNOSIS — F17210 Nicotine dependence, cigarettes, uncomplicated: Secondary | ICD-10-CM | POA: Diagnosis present

## 2021-12-25 DIAGNOSIS — F1220 Cannabis dependence, uncomplicated: Secondary | ICD-10-CM | POA: Diagnosis present

## 2021-12-25 DIAGNOSIS — F1920 Other psychoactive substance dependence, uncomplicated: Secondary | ICD-10-CM | POA: Diagnosis present

## 2021-12-25 DIAGNOSIS — B349 Viral infection, unspecified: Secondary | ICD-10-CM | POA: Diagnosis present

## 2021-12-25 DIAGNOSIS — Z720 Tobacco use: Secondary | ICD-10-CM

## 2021-12-25 DIAGNOSIS — E7800 Pure hypercholesterolemia, unspecified: Secondary | ICD-10-CM | POA: Diagnosis present

## 2021-12-25 DIAGNOSIS — Z91148 Patient's other noncompliance with medication regimen for other reason: Secondary | ICD-10-CM | POA: Diagnosis not present

## 2021-12-25 DIAGNOSIS — Z20822 Contact with and (suspected) exposure to covid-19: Secondary | ICD-10-CM | POA: Diagnosis present

## 2021-12-25 DIAGNOSIS — E1010 Type 1 diabetes mellitus with ketoacidosis without coma: Secondary | ICD-10-CM | POA: Diagnosis not present

## 2021-12-25 DIAGNOSIS — E869 Volume depletion, unspecified: Secondary | ICD-10-CM | POA: Diagnosis present

## 2021-12-25 DIAGNOSIS — N179 Acute kidney failure, unspecified: Secondary | ICD-10-CM | POA: Diagnosis present

## 2021-12-25 DIAGNOSIS — Z882 Allergy status to sulfonamides status: Secondary | ICD-10-CM

## 2021-12-25 DIAGNOSIS — E1011 Type 1 diabetes mellitus with ketoacidosis with coma: Principal | ICD-10-CM | POA: Diagnosis present

## 2021-12-25 DIAGNOSIS — E1040 Type 1 diabetes mellitus with diabetic neuropathy, unspecified: Secondary | ICD-10-CM | POA: Diagnosis present

## 2021-12-25 DIAGNOSIS — Z79899 Other long term (current) drug therapy: Secondary | ICD-10-CM | POA: Diagnosis not present

## 2021-12-25 DIAGNOSIS — Z833 Family history of diabetes mellitus: Secondary | ICD-10-CM

## 2021-12-25 DIAGNOSIS — E1140 Type 2 diabetes mellitus with diabetic neuropathy, unspecified: Secondary | ICD-10-CM | POA: Diagnosis present

## 2021-12-25 HISTORY — DX: Systemic inflammatory response syndrome (sirs) of non-infectious origin without acute organ dysfunction: R65.10

## 2021-12-25 LAB — BLOOD GAS, VENOUS
Acid-base deficit: 2.3 mmol/L — ABNORMAL HIGH (ref 0.0–2.0)
Bicarbonate: 21.4 mmol/L (ref 20.0–28.0)
Drawn by: 27160
O2 Saturation: 83.6 %
Patient temperature: 36.7
pCO2, Ven: 33 mmHg — ABNORMAL LOW (ref 44–60)
pH, Ven: 7.42 (ref 7.25–7.43)
pO2, Ven: 49 mmHg — ABNORMAL HIGH (ref 32–45)

## 2021-12-25 LAB — CBG MONITORING, ED
Glucose-Capillary: >600 mg/dL (ref 70–99)
Glucose-Capillary: >600 mg/dL (ref 70–99)
Glucose-Capillary: 364 mg/dL — ABNORMAL HIGH (ref 70–99)
Glucose-Capillary: 301 mg/dL — ABNORMAL HIGH (ref 70–99)
Glucose-Capillary: 220 mg/dL — ABNORMAL HIGH (ref 70–99)

## 2021-12-25 LAB — URINALYSIS, ROUTINE W REFLEX MICROSCOPIC
Bacteria, UA: NONE SEEN
Bilirubin Urine: NEGATIVE
Glucose, UA: >=500 mg/dL — AB
Ketones, ur: 80 mg/dL — AB
Leukocytes,Ua: NEGATIVE
Nitrite: NEGATIVE
Protein, ur: 100 mg/dL — AB
Specific Gravity, Urine: 1.024 (ref 1.005–1.030)
pH: 5 (ref 5.0–8.0)

## 2021-12-25 LAB — RAPID URINE DRUG SCREEN, HOSP PERFORMED
Amphetamines: NOT DETECTED
Barbiturates: NOT DETECTED
Benzodiazepines: NOT DETECTED
Cocaine: POSITIVE — AB
Opiates: NOT DETECTED
Tetrahydrocannabinol: POSITIVE — AB

## 2021-12-25 LAB — LIPASE, BLOOD: Lipase: 24 U/L (ref 11–51)

## 2021-12-25 LAB — GLUCOSE, CAPILLARY
Glucose-Capillary: 154 mg/dL — ABNORMAL HIGH (ref 70–99)
Glucose-Capillary: 161 mg/dL — ABNORMAL HIGH (ref 70–99)
Glucose-Capillary: 172 mg/dL — ABNORMAL HIGH (ref 70–99)
Glucose-Capillary: 194 mg/dL — ABNORMAL HIGH (ref 70–99)
Glucose-Capillary: 196 mg/dL — ABNORMAL HIGH (ref 70–99)
Glucose-Capillary: 232 mg/dL — ABNORMAL HIGH (ref 70–99)
Glucose-Capillary: 257 mg/dL — ABNORMAL HIGH (ref 70–99)
Glucose-Capillary: 286 mg/dL — ABNORMAL HIGH (ref 70–99)

## 2021-12-25 LAB — BASIC METABOLIC PANEL
Anion gap: 15 (ref 5–15)
BUN: 39 mg/dL — ABNORMAL HIGH (ref 6–20)
CO2: 24 mmol/L (ref 22–32)
Calcium: 8.4 mg/dL — ABNORMAL LOW (ref 8.9–10.3)
Chloride: 100 mmol/L (ref 98–111)
Creatinine, Ser: 1.23 mg/dL (ref 0.61–1.24)
GFR, Estimated: >60 mL/min (ref >60)
Glucose, Bld: 186 mg/dL — ABNORMAL HIGH (ref 70–99)
Potassium: 3.3 mmol/L — ABNORMAL LOW (ref 3.5–5.1)
Sodium: 139 mmol/L (ref 135–145)

## 2021-12-25 LAB — CBC WITH DIFFERENTIAL/PLATELET
Abs Immature Granulocytes: 0.11 10*3/uL — ABNORMAL HIGH (ref 0.00–0.07)
Basophils Absolute: 0.1 10*3/uL (ref 0.0–0.1)
Basophils Relative: 0 %
Eosinophils Absolute: 0 10*3/uL (ref 0.0–0.5)
Eosinophils Relative: 0 %
HCT: 54.1 % — ABNORMAL HIGH (ref 39.0–52.0)
Hemoglobin: 17.4 g/dL — ABNORMAL HIGH (ref 13.0–17.0)
Immature Granulocytes: 1 %
Lymphocytes Relative: 7 %
Lymphs Abs: 1.5 10*3/uL (ref 0.7–4.0)
MCH: 28.4 pg (ref 26.0–34.0)
MCHC: 32.2 g/dL (ref 30.0–36.0)
MCV: 88.4 fL (ref 80.0–100.0)
Monocytes Absolute: 1 10*3/uL (ref 0.1–1.0)
Monocytes Relative: 5 %
Neutro Abs: 18.4 10*3/uL — ABNORMAL HIGH (ref 1.7–7.7)
Neutrophils Relative %: 87 %
Platelets: 424 10*3/uL — ABNORMAL HIGH (ref 150–400)
RBC: 6.12 MIL/uL — ABNORMAL HIGH (ref 4.22–5.81)
RDW: 13.5 % (ref 11.5–15.5)
WBC: 21.1 10*3/uL — ABNORMAL HIGH (ref 4.0–10.5)
nRBC: 0 % (ref 0.0–0.2)

## 2021-12-25 LAB — PROTIME-INR
INR: 1.1 (ref 0.8–1.2)
Prothrombin Time: 13.7 seconds (ref 11.4–15.2)

## 2021-12-25 LAB — BETA-HYDROXYBUTYRIC ACID
Beta-Hydroxybutyric Acid: >8 mmol/L — ABNORMAL HIGH (ref 0.05–0.27)
Beta-Hydroxybutyric Acid: 5.09 mmol/L — ABNORMAL HIGH (ref 0.05–0.27)

## 2021-12-25 LAB — HIV ANTIBODY (ROUTINE TESTING W REFLEX): HIV Screen 4th Generation wRfx: NONREACTIVE

## 2021-12-25 LAB — LACTIC ACID, PLASMA
Lactic Acid, Venous: 3 mmol/L (ref 0.5–1.9)
Lactic Acid, Venous: 1.5 mmol/L (ref 0.5–1.9)

## 2021-12-25 LAB — SARS CORONAVIRUS 2 BY RT PCR: SARS Coronavirus 2 by RT PCR: NEGATIVE

## 2021-12-25 LAB — APTT: aPTT: 23 seconds — ABNORMAL LOW (ref 24–36)

## 2021-12-25 MED ORDER — INSULIN REGULAR(HUMAN) IN NACL 100-0.9 UT/100ML-% IV SOLN
INTRAVENOUS | Status: DC
  Filled 2021-12-25: qty 100

## 2021-12-25 MED ORDER — SODIUM CHLORIDE 0.9 % IV SOLN
2.0000 g | Freq: Once | INTRAVENOUS | Status: AC
  Administered 2021-12-25: 2 g via INTRAVENOUS
  Filled 2021-12-25: qty 20

## 2021-12-25 MED ORDER — ACETAMINOPHEN 325 MG PO TABS: 650.0000 mg | ORAL_TABLET | Freq: Four times a day (QID) | ORAL | Status: DC | PRN

## 2021-12-25 MED ORDER — LACTATED RINGERS IV BOLUS
20.0000 mL/kg | Freq: Once | INTRAVENOUS | Status: AC
  Administered 2021-12-25: 1180 mL via INTRAVENOUS

## 2021-12-25 MED ORDER — LACTATED RINGERS IV BOLUS (SEPSIS): 1000.0000 mL | Freq: Once | INTRAVENOUS | Status: DC

## 2021-12-25 MED ORDER — ONDANSETRON HCL 4 MG/2ML IJ SOLN
4.0000 mg | Freq: Four times a day (QID) | INTRAMUSCULAR | Status: DC | PRN
  Administered 2021-12-25: 4 mg via INTRAVENOUS
  Filled 2021-12-25: qty 2

## 2021-12-25 MED ORDER — GABAPENTIN 300 MG PO CAPS: 300.0000 mg | ORAL_CAPSULE | Freq: Every day | ORAL | Status: DC

## 2021-12-25 MED ORDER — DEXTROSE IN LACTATED RINGERS 5 % IV SOLN: INTRAVENOUS | Status: DC

## 2021-12-25 MED ORDER — INSULIN REGULAR(HUMAN) IN NACL 100-0.9 UT/100ML-% IV SOLN
INTRAVENOUS | Status: DC
  Administered 2021-12-25: 6 [IU]/h via INTRAVENOUS
  Administered 2021-12-26: 2.6 [IU]/h via INTRAVENOUS
  Filled 2021-12-25: qty 100

## 2021-12-25 MED ORDER — DEXTROSE 50 % IV SOLN: 0.0000 mL | INTRAVENOUS | Status: DC | PRN

## 2021-12-25 MED ORDER — LACTATED RINGERS IV SOLN: INTRAVENOUS | Status: DC

## 2021-12-25 MED ORDER — ENOXAPARIN SODIUM 40 MG/0.4ML IJ SOSY: 40.0000 mg | PREFILLED_SYRINGE | INTRAMUSCULAR | Status: DC

## 2021-12-25 MED ORDER — SODIUM CHLORIDE 0.9 % IV SOLN
500.0000 mg | Freq: Once | INTRAVENOUS | Status: AC
  Administered 2021-12-25: 500 mg via INTRAVENOUS
  Filled 2021-12-25: qty 5

## 2021-12-25 MED ORDER — ONDANSETRON HCL 4 MG/2ML IJ SOLN
4.0000 mg | Freq: Once | INTRAMUSCULAR | Status: AC
  Administered 2021-12-25: 4 mg via INTRAVENOUS
  Filled 2021-12-25: qty 2

## 2021-12-25 MED ORDER — LAMOTRIGINE 25 MG PO TABS
25.0000 mg | ORAL_TABLET | Freq: Two times a day (BID) | ORAL | Status: DC
  Filled 2021-12-25 (×3): qty 1

## 2021-12-25 MED ORDER — LACTATED RINGERS IV BOLUS
1000.0000 mL | Freq: Once | INTRAVENOUS | Status: AC
  Administered 2021-12-25: 1000 mL via INTRAVENOUS

## 2021-12-25 MED ORDER — POTASSIUM CHLORIDE 10 MEQ/100ML IV SOLN
10.0000 meq | INTRAVENOUS | Status: AC
  Administered 2021-12-25 (×2): 10 meq via INTRAVENOUS
  Filled 2021-12-25 (×2): qty 100

## 2021-12-25 NOTE — ED Notes (Signed)
Informed primary RN of critical glucose result 796

## 2021-12-25 NOTE — Assessment & Plan Note (Addendum)
Patient presented with leukocytosis, tachycardia, and AKI Likely related to his point depletion resulting from DKA -UA negative for pyuria -Chest x-ray negative for infiltrates -Follow blood cultures-neg -sepsis ruled out

## 2021-12-25 NOTE — ED Triage Notes (Signed)
Pt arrived to [facility]OV c/o vomiting x 3 days with diarrhea. Pt voiced a virus going around in his house. Pt is type 1 DM and does not want to check his BS. Pt is afebrile .

## 2021-12-25 NOTE — ED Notes (Addendum)
Informed pt that we needed an accurate rectal temperature. Pt states "hell no I don't give a fuck y'all not getting it." Pt educated by nurse reasoning and risk factors of not getting accurate temperature. Nurse aware.

## 2021-12-25 NOTE — ED Provider Notes (Signed)
[facility] EMERGENCY DEPARTMENT Provider Note   CSN: [id number] Arrival date & time: 12/25/21  7121     History  Chief Complaint  Patient presents with   Emesis    ROMANI WILBON is a 26 y.o. male.  The history is provided by the patient and a significant other. No language interpreter was used.  Emesis    26 year old male with significant history of type 1 diabetes, polysubstance use, recurrent DKA, presenting for evaluation of nausea and vomiting.  History obtained from patient and from fianc who is at bedside.  Approximately 3 to 4 days ago several family member has cold symptoms which includes congestion and cough.  Patient also developed similar symptoms but for the past 3 days he has had persistent nausea and vomiting and having diarrhea.  He is complaining of some body aches, fatigue, increased thirst and urination.  He does not normally check his blood sugar.  Family recently got tested for COVID which came back negative.   No report of shortness of breath.  Does endorse abdominal discomfort.  Home Medications Prior to Admission medications   Medication Sig Start Date End Date Taking? Authorizing Provider  blood glucose meter kit and supplies Dispense based on patient and insurance preference. Use up to four times daily as directed. (FOR ICD-10 E10.9, E11.9). 07/01/21   Johnson, Clanford L, MD  gabapentin (NEURONTIN) 300 MG capsule Take 1 capsule (300 mg total) by mouth at bedtime. 07/01/21   Johnson, Clanford L, MD  insulin NPH-regular Human (70-30) 100 UNIT/ML injection Inject 10 Units into the skin 2 (two) times daily with a meal. 12/02/21   Manuella Ghazi, Pratik D, DO  lamoTRIgine (LAMICTAL) 25 MG tablet Take 25 mg by mouth 2 (two) times daily. 11/15/21   [provider]  lisinopril (ZESTRIL) 10 MG tablet Take 10 mg by mouth daily. 11/15/21   [provider]      Allergies    Sulfa antibiotics    Review of Systems   Review of Systems  Gastrointestinal:   Positive for vomiting.  All other systems reviewed and are negative.   Physical Exam Updated Vital Signs BP (!) 164/119   Pulse (!) 118   Temp 98 F (36.7 C) (Oral)   Resp 14   Ht $R'5\' 11"'Nw$  (1.803 m)   Wt 59 kg   SpO2 100%   BMI 18.13 kg/m  Physical Exam Vitals and nursing note reviewed.  Constitutional:      General: He is not in acute distress.    Appearance: He is well-developed.  HENT:     Head: Atraumatic.     Mouth/Throat:     Mouth: Mucous membranes are dry.  Eyes:     Conjunctiva/sclera: Conjunctivae normal.  Cardiovascular:     Rate and Rhythm: Tachycardia present.  Pulmonary:     Comments: Tachypneic but no wheezes, rales, or rhonchi heard Abdominal:     Palpations: Abdomen is soft.     Tenderness: There is no abdominal tenderness.  Musculoskeletal:     Cervical back: Neck supple.  Skin:    Findings: No rash.  Neurological:     Mental Status: He is alert.     ED Results / Procedures / Treatments   Labs (all labs ordered are listed, but only abnormal results are displayed) Labs Reviewed  CBC WITH DIFFERENTIAL/PLATELET - Abnormal; Notable for the following components:      Result Value   WBC 21.1 (*)    RBC 6.12 (*)  Hemoglobin 17.4 (*)    HCT 54.1 (*)    Platelets 424 (*)    Neutro Abs 18.4 (*)    Abs Immature Granulocytes 0.11 (*)    All other components within normal limits  COMPREHENSIVE METABOLIC PANEL - Abnormal; Notable for the following components:   Sodium 134 (*)    Chloride 85 (*)    CO2 18 (*)    Glucose, Bld 796 (*)    BUN 59 (*)    Creatinine, Ser 2.16 (*)    Total Bilirubin 2.3 (*)    GFR, Estimated 42 (*)    Anion gap 31 (*)    All other components within normal limits  URINALYSIS, ROUTINE W REFLEX MICROSCOPIC - Abnormal; Notable for the following components:   Color, Urine STRAW (*)    Glucose, UA >=500 (*)    Hgb urine dipstick MODERATE (*)    Ketones, ur 80 (*)    Protein, ur 100 (*)    All other components within  normal limits  RAPID URINE DRUG SCREEN, HOSP PERFORMED - Abnormal; Notable for the following components:   Cocaine POSITIVE (*)    Tetrahydrocannabinol POSITIVE (*)    All other components within normal limits  LACTIC ACID, PLASMA - Abnormal; Notable for the following components:   Lactic Acid, Venous 3.0 (*)    All other components within normal limits  APTT - Abnormal; Notable for the following components:   aPTT 23 (*)    All other components within normal limits  BETA-HYDROXYBUTYRIC ACID - Abnormal; Notable for the following components:   Beta-Hydroxybutyric Acid >8.00 (*)    All other components within normal limits  BLOOD GAS, VENOUS - Abnormal; Notable for the following components:   pCO2, Ven 33 (*)    pO2, Ven 49 (*)    Acid-base deficit 2.3 (*)    All other components within normal limits  CBG MONITORING, ED - Abnormal; Notable for the following components:   Glucose-Capillary >600 (*)    All other components within normal limits  CBG MONITORING, ED - Abnormal; Notable for the following components:   Glucose-Capillary >600 (*)    All other components within normal limits  SARS CORONAVIRUS 2 BY RT PCR  CULTURE, BLOOD (ROUTINE X 2)  CULTURE, BLOOD (ROUTINE X 2)  URINE CULTURE  LIPASE, BLOOD  LACTIC ACID, PLASMA  PROTIME-INR  BETA-HYDROXYBUTYRIC ACID  I-STAT VENOUS BLOOD GAS, ED    EKG None ED ECG REPORT   Date: 12/25/2021  Rate: 119  Rhythm: sinus tachycardia  QRS Axis: right  Intervals: normal  ST/T Wave abnormalities: nonspecific ST changes  Conduction Disutrbances:none  Narrative Interpretation:   Old EKG Reviewed: unchanged  I have personally reviewed the EKG tracing and agree with the computerized printout as noted.   Radiology DG Chest Portable 1 View  Result Date: 12/25/2021 CLINICAL DATA:  Cough EXAM: PORTABLE CHEST 1 VIEW COMPARISON:  03/05/2021 FINDINGS: Normal heart size. Postsurgical changes in the right suprahilar region. No focal airspace  consolidation, pleural effusion, or pneumothorax. IMPRESSION: No active disease. Electronically Signed   By: Davina Poke D.O.   On: 12/25/2021 09:33    Procedures .Critical Care  Performed by: Domenic Moras, PA-C Authorized by: Domenic Moras, PA-C   Critical care provider statement:    Critical care time (minutes):  65   Critical care was time spent personally by me on the following activities:  Development of treatment plan with patient or surrogate, discussions with consultants, evaluation of patient's response to  treatment, examination of patient, ordering and review of laboratory studies, ordering and review of radiographic studies, ordering and performing treatments and interventions, pulse oximetry, re-evaluation of patient's condition and review of old charts     Medications Ordered in ED Medications  insulin regular, human (MYXREDLIN) 100 units/ 100 mL infusion (6 Units/hr Intravenous New Bag/Given 12/25/21 1010)  lactated ringers infusion (has no administration in time range)  dextrose 5 % in lactated ringers infusion (has no administration in time range)  dextrose 50 % solution 0-50 mL (has no administration in time range)  azithromycin (ZITHROMAX) 500 mg in sodium chloride 0.9 % 250 mL IVPB (500 mg Intravenous New Bag/Given 12/25/21 1103)  ondansetron (ZOFRAN) injection 4 mg (has no administration in time range)  lactated ringers bolus 1,180 mL (1,180 mLs Intravenous New Bag/Given 12/25/21 1100)  cefTRIAXone (ROCEPHIN) 2 g in sodium chloride 0.9 % 100 mL IVPB (0 g Intravenous Stopped 12/25/21 1059)    ED Course/ Medical Decision Making/ A&P                           Medical Decision Making Amount and/or Complexity of Data Reviewed Labs: ordered. Radiology: ordered. ECG/medicine tests: ordered.  Risk Prescription drug management. Decision regarding hospitalization.   BP (!) 164/119   Pulse (!) 118   Temp 98 F (36.7 C) (Oral)   Resp 14   Ht $R'5\' 11"'Eo$  (1.803 m)    Wt 59 kg   SpO2 100%   BMI 18.13 kg/m   40:57 AM   26 year old male with significant history of type 1 diabetes, polysubstance use, recurrent DKA, presenting for evaluation of nausea and vomiting.  History obtained from patient and from fianc who is at bedside.  Approximately 3 to 4 days ago several family member has cold symptoms which includes congestion and cough.  Patient also developed similar symptoms but for the past 3 days he has had persistent nausea and vomiting and having diarrhea.  He is complaining of some body aches, fatigue, increased thirst and urination.  He does not normally check his blood sugar.  Family recently got tested for COVID which came back negative.   No report of shortness of breath.  Does endorse abdominal discomfort.  On exam this is an ill-appearing male appears to be in moderate discomfort.  He is tachypneic, tachycardic, and dry in appearance.  He coughs several times in the room.  He appears mildly somnolent but arousable.  Has a nontender abdomen.  His lungs otherwise clear.  His presentation is concerning for DKA likely brought on by recent viral infection.  I have initiate work-up, we will fluid hydrate, initiate insulin, and will check for COVID as well as flu.  10:05 AM Labs obtained independently viewed interpreted by me and I agree with radiologist interpretation.  Patient has an elevated lactic acid of 3.0 as well as an elevated white count of 21.1.  Certainly this value could be in the setting of dehydration from severe DKA but in the setting of having initial cold symptoms it would be reasonable to treated as sepsis likely from a pulmonary source.  I have ordered Rocephin and azithromycin antibiotic.  Blood cultures have been ordered.  Chest x-ray obtained independently viewed interpreted by me and without any focal infiltrate.  11:59 AM COVID negative, chest x-ray without pneumonia.  Lactic acid initially was 3.0 but after IV fluid, and has improved to  1.5.  Initial CBG greater than 600, with 80  ketones in urine, and a wide anion gap of 31 consistent with DKA.  Patient's beta hydroxy butyrate acid is markedly elevated at greater than 8.  EKG shows sinus tachycardia.  UDS positive for cocaine and tetrahydrocannabinol.  Patient is currently receiving IV fluid, antibiotic, and insulin as per protocol.  I appreciate consultation from [facility] hospitalist, Dr. Dorothea Ogle, who agrees to see and will admit patient for further care.  This patient presents to the ED for concern of ams, this involves an extensive number of treatment options, and is a complaint that carries with it a high risk of complications and morbidity.  The differential diagnosis includes dka, viral illness, pna, food poisoning, hhs, pna  Co morbidities that complicate the patient evaluation type1 diabetes  Polysubstance use Additional history obtained:  Additional history obtained from family member External records from outside source obtained and reviewed including EMR  Lab Tests:  I Ordered, and personally interpreted labs.  The pertinent results include:  as above  Imaging Studies ordered:  I ordered imaging studies including CXR I independently visualized and interpreted imaging which showed no pneumonia I agree with the radiologist interpretation  Cardiac Monitoring:  The patient was maintained on a cardiac monitor.  I personally viewed and interpreted the cardiac monitored which showed an underlying rhythm of: sinus tachycardia  Medicines ordered and prescription drug management:  I ordered medication including insulin  for DKA Reevaluation of the patient after these medicines showed that the patient improved I have reviewed the patients home medicines and have made adjustments as needed  Test Considered: chest ct  Abd/pelvis CT  Critical Interventions: sepsis protocol  DKA protocol  Consultations Obtained:  I requested consultation with the hospitalist,  and  discussed lab and imaging findings as well as pertinent plan - they recommend: admission  Problem List / ED Course: DKA  Viral illness  Reevaluation:  After the interventions noted above, I reevaluated the patient and found that they have :improved  Social Determinants of Health: tobacco abuse, recommend tobacco cessation  Dispostion:  After consideration of the diagnostic results and the patients response to treatment, I feel that the patent would benefit from admission. .         Final Clinical Impression(s) / ED Diagnoses Final diagnoses:  Type 1 diabetes mellitus with ketoacidotic coma ([place])  Viral illness    Rx / DC Orders ED Discharge Orders     None         Domenic Moras, PA-C 12/25/21 1203    Milton Ferguson, MD 12/27/21 [phone]

## 2021-12-25 NOTE — Hospital Course (Signed)
26 year old male with a history of polysubstance abuse, diabetes mellitus type 1, hypertension, diabetic neuropathy presenting with 3-day history of generalized weakness, fatigue, malaise, and associated nausea and vomiting.  The patient states that family members have been suffering from URI type symptoms about 4 days prior to this admission.  The patient states that he began developing URI type symptoms about 3 days prior to coming this admission.  However, he denies any chest pain, shortness of breath, hemoptysis, diarrhea, abdominal pain, hematochezia, melena.  There is no hematemesis.  There is no dysuria or hematuria. He states that he has not been compliant with his insulin.  He last took insulin on the morning of admission.  Prior to that he has only been using it about twice per week.  He states that he last used cocaine about 3 days prior to admission.  He denies any alcohol.  He smokes 1 pack/day. In the ED, the patient was afebrile but tachycardic and hemodynamically stable.  Oxygen saturation 100% room air.  WBC 21.1, hemoglobin 17.4, platelets 424.  BMP shows sodium 134, potassium 4.5, bicarbonate 18, serum glucose of 96, serum creatinine 2.16.  Lactic acid 3.0>> 1.5.  UDS positive for cocaine and THC.  EKG shows sinus tachycardia with nonspecific ST changes.  Chest x-ray was negative for infiltrates or edema.  UA negative for pyuria.  Blood cultures were obtained and the patient was given empiric ceftriaxone and azithromycin.  The patient was started on IV insulin and IV fluids.

## 2021-12-25 NOTE — Assessment & Plan Note (Signed)
Including cocaine, THC, tobacco

## 2021-12-25 NOTE — Assessment & Plan Note (Signed)
Tobacco cessation discussed 

## 2021-12-25 NOTE — H&P (Signed)
History and Physical    Patient: Pedro [name] HDQ:[id number] DOB: [birth year] DOA: 12/25/2021 DOS: the patient was seen and examined on 12/25/2021 PCP: Neale Burly, MD  Patient coming from: Home  Chief Complaint:  Chief Complaint  Patient presents with   Emesis   HPI: Pedro [name] is a 26 year old male with a history of polysubstance abuse, diabetes mellitus type 1, hypertension, diabetic neuropathy presenting with 3-day history of generalized weakness, fatigue, malaise, and associated nausea and vomiting.  The patient states that family members have been suffering from URI type symptoms about 4 days prior to this admission.  The patient states that he began developing URI type symptoms about 3 days prior to coming this admission.  However, he denies any chest pain, shortness of breath, hemoptysis, diarrhea, abdominal pain, hematochezia, melena.  There is no hematemesis.  There is no dysuria or hematuria. He states that he has not been compliant with his insulin.  He last took insulin on the morning of admission.  Prior to that he has only been using it about twice per week.  He states that he last used cocaine about 3 days prior to admission.  He denies any alcohol.  He smokes 1 pack/day. In the ED, the patient was afebrile but tachycardic and hemodynamically stable.  Oxygen saturation 100% room air.  WBC 21.1, hemoglobin 17.4, platelets 424.  BMP shows sodium 134, potassium 4.5, bicarbonate 18, serum glucose of 96, serum creatinine 2.16.  Lactic acid 3.0>> 1.5.  UDS positive for cocaine and THC.  EKG shows sinus tachycardia with nonspecific ST changes.  Chest x-ray was negative for infiltrates or edema.  UA negative for pyuria.  Blood cultures were obtained and the patient was given empiric ceftriaxone and azithromycin.  The patient was started on IV insulin and IV fluids.  Review of Systems: As mentioned in the history of present illness. All other systems reviewed and are  negative. Past Medical History:  Diagnosis Date   ADHD (attention deficit hyperactivity disorder)    DM type 1 (diabetes mellitus, type 1) ([place])    diagnosed age 67   Herpes    High cholesterol    Hypertension    Noncompliance with medications    Past Surgical History:  Procedure Laterality Date   INTERCOSTAL NERVE BLOCK Right 12/10/2020   Procedure: INTERCOSTAL NERVE BLOCK;  Surgeon: Lajuana Matte, MD;  Location: [place];  Service: Thoracic;  Laterality: Right;   none     PLEURADESIS Right 12/10/2020   Procedure: MECHANICAL PLEURADESIS;  Surgeon: Lajuana Matte, MD;  Location: [place];  Service: Thoracic;  Laterality: Right;   PLEURECTOMY Right 12/10/2020   Procedure: APICAL PLEURECTOMY;  Surgeon: Lajuana Matte, MD;  Location: [place];  Service: Thoracic;  Laterality: Right;   Social History:  reports that he has been smoking cigarettes. He has a 5.00 pack-year smoking history. He has never used smokeless tobacco. He reports that he does not currently use drugs after having used the following drugs: Marijuana, Other-see comments, and Benzodiazepines. He reports that he does not drink alcohol.  Allergies  Allergen Reactions   Sulfa Antibiotics Hives and Other (See Comments)    Reaction:  Fever     Family History  Problem Relation Age of Onset   Diabetes Mellitus I Paternal Grandmother    Diabetes Mellitus I Paternal Aunt    Anxiety disorder Mother    Hypertension Mother    Hypertension Father     Prior to Admission medications  Medication Sig Start Date End Date Taking? Authorizing Provider  blood glucose meter kit and supplies Dispense based on patient and insurance preference. Use up to four times daily as directed. (FOR ICD-10 E10.9, E11.9). 07/01/21   Johnson, Clanford L, MD  gabapentin (NEURONTIN) 300 MG capsule Take 1 capsule (300 mg total) by mouth at bedtime. 07/01/21   Johnson, Clanford L, MD  insulin NPH-regular Human (70-30) 100 UNIT/ML injection Inject  10 Units into the skin 2 (two) times daily with a meal. 12/02/21   Manuella Ghazi, Pratik D, DO  lamoTRIgine (LAMICTAL) 25 MG tablet Take 25 mg by mouth 2 (two) times daily. 11/15/21   [provider]  lisinopril (ZESTRIL) 10 MG tablet Take 10 mg by mouth daily. 11/15/21   [provider]    Physical Exam: Vitals:   12/25/21 0912 12/25/21 0914 12/25/21 1130 12/25/21 1200  BP:   (!) 158/109 (!) 147/99  Pulse: (!) 118  (!) 111 (!) 118  Resp: $Remo'14  15 14  'OxoxS$ Temp:  98 F (36.7 C)    TempSrc:  Oral    SpO2: 100%  100% 100%  Weight:      Height:       GENERAL:  A&O x 3, NAD, well developed, cooperative, follows commands HEENT: [place]/AT, No thrush, No icterus, No oral ulcers Neck:  No neck mass, No meningismus, soft, supple CV: RRR, no S3, no S4, no rub, no JVD Lungs:  CTA, no wheeze, no rhonchi, good air movement Abd: soft/NT +BS, nondistended Ext: No edema, no lymphangitis, no cyanosis, no rashes Neuro:  CN II-XII intact, strength 4/5 in RUE, RLE, strength 4/5 LUE, LLE; sensation intact bilateral; no dysmetria; babinski equivocal  Data Reviewed: Data reviewed above in the history  Assessment and Plan: Polysubstance dependence (HCC) Including cocaine, THC, tobacco  SIRS (systemic inflammatory response syndrome) (HCC) Patient presented with leukocytosis, tachycardia, and AKI Likely related to his point depletion resulting from DKA -UA negative for pyuria -Chest x-ray negative for infiltrates -Follow blood cultures  AKI (acute kidney injury) ([place]) Baseline creatinine 1.1-1.3 Presented with serum creatinine 2.16 Secondary to volume depletion Continue IV fluids  Diabetic neuropathy (HCC) Continue gabapentin  DKA, type 1 ([place]) -patient started on IV insulin with q 1 hour CBG check and q 4 hour BMPs -pt started on aggressive fluid resuscitation -Electrolytes were monitored and repleted -transitioned to [place] insulin once anion gap closed -diet was advanced once anion gap  closed -08/09/2021 HbA1C--9.7   Tobacco abuse Tobacco cessation discussed      Advance Care Planning: FULL CODE  Consults: none  Family Communication: no family present  Severity of Illness: The appropriate patient status for this patient is INPATIENT. Inpatient status is judged to be reasonable and necessary in order to provide the required intensity of service to ensure the patient's safety. The patient's presenting symptoms, physical exam findings, and initial radiographic and laboratory data in the context of their chronic comorbidities is felt to place them at high risk for further clinical deterioration. Furthermore, it is not anticipated that the patient will be medically stable for discharge from the hospital within 2 midnights of admission.   * I certify that at the point of admission it is my clinical judgment that the patient will require inpatient hospital care spanning beyond 2 midnights from the point of admission due to high intensity of service, high risk for further deterioration and high frequency of surveillance required.*  Author: Orson Eva, MD 12/25/2021 12:43 PM  For on call  review [email or web address].

## 2021-12-25 NOTE — Assessment & Plan Note (Signed)
Baseline creatinine 1.1-1.3 Presented with serum creatinine 2.16 Secondary to volume depletion Continue IV fluids

## 2021-12-25 NOTE — Assessment & Plan Note (Signed)
-  patient started on IV insulin with q 1 hour CBG check and q 4 hour BMPs -pt started on aggressive fluid resuscitation -Electrolytes were monitored and repleted -transitioned to [place] insulin once anion gap closed -diet was advanced once anion gap closed -08/09/2021 HbA1C--9.7 -12/26/21--transition to [place] insulin

## 2021-12-25 NOTE — Assessment & Plan Note (Signed)
Continue gabapentin.

## 2021-12-25 NOTE — ED Notes (Signed)
Pt stated he is non-complaint with HTN meds and does not check his glucose.

## 2021-12-26 DIAGNOSIS — E1010 Type 1 diabetes mellitus with ketoacidosis without coma: Secondary | ICD-10-CM | POA: Diagnosis not present

## 2021-12-26 DIAGNOSIS — N179 Acute kidney failure, unspecified: Secondary | ICD-10-CM

## 2021-12-26 DIAGNOSIS — Z720 Tobacco use: Secondary | ICD-10-CM | POA: Diagnosis not present

## 2021-12-26 DIAGNOSIS — R6510 Systemic inflammatory response syndrome (SIRS) of non-infectious origin without acute organ dysfunction: Secondary | ICD-10-CM | POA: Diagnosis not present

## 2021-12-26 LAB — CBC
HCT: 38.9 % — ABNORMAL LOW (ref 39.0–52.0)
Hemoglobin: 12.8 g/dL — ABNORMAL LOW (ref 13.0–17.0)
MCH: 29.1 pg (ref 26.0–34.0)
MCHC: 32.9 g/dL (ref 30.0–36.0)
MCV: 88.4 fL (ref 80.0–100.0)
Platelets: 247 10*3/uL (ref 150–400)
RBC: 4.4 MIL/uL (ref 4.22–5.81)
RDW: 13.3 % (ref 11.5–15.5)
WBC: 14.4 10*3/uL — ABNORMAL HIGH (ref 4.0–10.5)
nRBC: 0 % (ref 0.0–0.2)

## 2021-12-26 LAB — BASIC METABOLIC PANEL
Anion gap: 6 (ref 5–15)
Anion gap: 11 (ref 5–15)
Anion gap: 10 (ref 5–15)
BUN: 35 mg/dL — ABNORMAL HIGH (ref 6–20)
BUN: 32 mg/dL — ABNORMAL HIGH (ref 6–20)
BUN: 29 mg/dL — ABNORMAL HIGH (ref 6–20)
CO2: 27 mmol/L (ref 22–32)
CO2: 28 mmol/L (ref 22–32)
CO2: 28 mmol/L (ref 22–32)
Calcium: 7.9 mg/dL — ABNORMAL LOW (ref 8.9–10.3)
Calcium: 8.1 mg/dL — ABNORMAL LOW (ref 8.9–10.3)
Calcium: 7.9 mg/dL — ABNORMAL LOW (ref 8.9–10.3)
Chloride: 101 mmol/L (ref 98–111)
Chloride: 99 mmol/L (ref 98–111)
Chloride: 98 mmol/L (ref 98–111)
Creatinine, Ser: 1.22 mg/dL (ref 0.61–1.24)
Creatinine, Ser: 1.13 mg/dL (ref 0.61–1.24)
Creatinine, Ser: 1.01 mg/dL (ref 0.61–1.24)
GFR, Estimated: >60 mL/min (ref >60)
GFR, Estimated: >60 mL/min (ref >60)
GFR, Estimated: >60 mL/min (ref >60)
Glucose, Bld: 226 mg/dL — ABNORMAL HIGH (ref 70–99)
Glucose, Bld: 179 mg/dL — ABNORMAL HIGH (ref 70–99)
Glucose, Bld: 165 mg/dL — ABNORMAL HIGH (ref 70–99)
Potassium: 3.3 mmol/L — ABNORMAL LOW (ref 3.5–5.1)
Potassium: 3.2 mmol/L — ABNORMAL LOW (ref 3.5–5.1)
Potassium: 3.3 mmol/L — ABNORMAL LOW (ref 3.5–5.1)
Sodium: 134 mmol/L — ABNORMAL LOW (ref 135–145)
Sodium: 138 mmol/L (ref 135–145)
Sodium: 136 mmol/L (ref 135–145)

## 2021-12-26 LAB — GLUCOSE, CAPILLARY
Glucose-Capillary: 125 mg/dL — ABNORMAL HIGH (ref 70–99)
Glucose-Capillary: 137 mg/dL — ABNORMAL HIGH (ref 70–99)
Glucose-Capillary: 139 mg/dL — ABNORMAL HIGH (ref 70–99)
Glucose-Capillary: 142 mg/dL — ABNORMAL HIGH (ref 70–99)
Glucose-Capillary: 168 mg/dL — ABNORMAL HIGH (ref 70–99)
Glucose-Capillary: 172 mg/dL — ABNORMAL HIGH (ref 70–99)
Glucose-Capillary: 172 mg/dL — ABNORMAL HIGH (ref 70–99)
Glucose-Capillary: 180 mg/dL — ABNORMAL HIGH (ref 70–99)
Glucose-Capillary: 186 mg/dL — ABNORMAL HIGH (ref 70–99)
Glucose-Capillary: 189 mg/dL — ABNORMAL HIGH (ref 70–99)
Glucose-Capillary: 192 mg/dL — ABNORMAL HIGH (ref 70–99)
Glucose-Capillary: 195 mg/dL — ABNORMAL HIGH (ref 70–99)
Glucose-Capillary: 198 mg/dL — ABNORMAL HIGH (ref 70–99)
Glucose-Capillary: 222 mg/dL — ABNORMAL HIGH (ref 70–99)

## 2021-12-26 LAB — MRSA NEXT GEN BY PCR, NASAL: MRSA by PCR Next Gen: NOT DETECTED

## 2021-12-26 LAB — BETA-HYDROXYBUTYRIC ACID
Beta-Hydroxybutyric Acid: 2.01 mmol/L — ABNORMAL HIGH (ref 0.05–0.27)
Beta-Hydroxybutyric Acid: 2.19 mmol/L — ABNORMAL HIGH (ref 0.05–0.27)

## 2021-12-26 MED ORDER — INSULIN ASPART 100 UNIT/ML IJ SOLN: 0.0000 [IU] | Freq: Three times a day (TID) | INTRAMUSCULAR | Status: DC

## 2021-12-26 MED ORDER — INSULIN ASPART 100 UNIT/ML IJ SOLN
0.0000 [IU] | Freq: Three times a day (TID) | INTRAMUSCULAR | Status: DC
  Administered 2021-12-26: 1 [IU] via SUBCUTANEOUS

## 2021-12-26 MED ORDER — INSULIN GLARGINE-YFGN 100 UNIT/ML ~~LOC~~ SOLN
10.0000 [IU] | Freq: Every day | SUBCUTANEOUS | Status: DC
  Administered 2021-12-26: 10 [IU] via SUBCUTANEOUS
  Filled 2021-12-26 (×2): qty 0.1

## 2021-12-26 MED ORDER — INSULIN ASPART 100 UNIT/ML IJ SOLN: 0.0000 [IU] | Freq: Every day | INTRAMUSCULAR | Status: DC

## 2021-12-26 MED ORDER — CHLORHEXIDINE GLUCONATE CLOTH 2 % EX PADS
6.0000 | MEDICATED_PAD | Freq: Every day | CUTANEOUS | Status: DC
  Administered 2021-12-26: 6 via TOPICAL

## 2021-12-26 MED ORDER — LACTATED RINGERS IV SOLN: INTRAVENOUS | Status: DC

## 2021-12-26 NOTE — Inpatient Diabetes Management (Signed)
Inpatient Diabetes Program Recommendations  AACE/ADA: New Consensus Statement on Inpatient Glycemic Control (2015)  Target Ranges:  Prepandial:   less than 140 mg/dL      Peak postprandial:   less than 180 mg/dL (1-2 hours)      Critically ill patients:  140 - 180 mg/dL   Lab Results  Component Value Date   GLUCAP 186 (H) 12/26/2021   HGBA1C 9.7 (H) 08/19/2021    Review of Glycemic Control  Diabetes history: type 1 Outpatient Diabetes medications: NPH/Regular 70/30 insulin 10 units BID Current orders for Inpatient glycemic control: IV insulin drip  Inpatient Diabetes Program Recommendations:   Received diabetes coordinator consult for DM management.  When patient is ready to transition off IV insulin drip, recommend 70/30 8 units BID, Novolog 0-9 units correction scale every 4 hours if not consistently eating.  When patient is eating consistently, change Novolog 0-9 units correction scale to TID  and add Novolog 0-5 units HS scale.  Titrate dosages as needed.  Will continue to monitor blood sugars while in the hospital.  Harvel Ricks RN BSN CDE Diabetes Coordinator Pager: [phone]  8am-5pm

## 2021-12-26 NOTE — Progress Notes (Signed)
Pt requesting to leave AMA. MD Tat informed. AMA form signed by pt. Stating he's "tired of laying here and needs to get back to work". Educated on the risks of leaving before medical treatment is completed. Pt seen leaving the dept with steady gait and no further questions.

## 2021-12-26 NOTE — Progress Notes (Signed)
PROGRESS NOTE  Pedro [name] GLO:[id number] DOB: 1996-01-02 DOA: 12/25/2021 PCP: Neale Burly, MD  Brief History:  26 year old male with a history of polysubstance abuse, diabetes mellitus type 1, hypertension, diabetic neuropathy presenting with 3-day history of generalized weakness, fatigue, malaise, and associated nausea and vomiting.  The patient states that family members have been suffering from URI type symptoms about 4 days prior to this admission.  The patient states that he began developing URI type symptoms about 3 days prior to coming this admission.  However, he denies any chest pain, shortness of breath, hemoptysis, diarrhea, abdominal pain, hematochezia, melena.  There is no hematemesis.  There is no dysuria or hematuria. He states that he has not been compliant with his insulin.  He last took insulin on the morning of admission.  Prior to that he has only been using it about twice per week.  He states that he last used cocaine about 3 days prior to admission.  He denies any alcohol.  He smokes 1 pack/day. In the ED, the patient was afebrile but tachycardic and hemodynamically stable.  Oxygen saturation 100% room air.  WBC 21.1, hemoglobin 17.4, platelets 424.  BMP shows sodium 134, potassium 4.5, bicarbonate 18, serum glucose of 96, serum creatinine 2.16.  Lactic acid 3.0>> 1.5.  UDS positive for cocaine and THC.  EKG shows sinus tachycardia with nonspecific ST changes.  Chest x-ray was negative for infiltrates or edema.  UA negative for pyuria.  Blood cultures were obtained and the patient was given empiric ceftriaxone and azithromycin.  The patient was started on IV insulin and IV fluids.     Assessment and Plan: * SIRS (systemic inflammatory response syndrome) (HCC) Patient presented with leukocytosis, tachycardia, and AKI Likely related to his point depletion resulting from DKA -UA negative for pyuria -Chest x-ray negative for infiltrates -Follow blood  cultures-neg -sepsis ruled out  Polysubstance dependence (HCC) Including cocaine, THC, tobacco  AKI (acute kidney injury) ([place]) Baseline creatinine 1.1-1.3 Presented with serum creatinine 2.16 Secondary to volume depletion Continue IV fluids  DKA, type 1 ([place]) -patient started on IV insulin with q 1 hour CBG check and q 4 hour BMPs -pt started on aggressive fluid resuscitation -Electrolytes were monitored and repleted -transitioned to [place] insulin once anion gap closed -diet was advanced once anion gap closed -08/09/2021 HbA1C--9.7 -12/26/21--transition to [place] insulin   Diabetic neuropathy ([place]) Continue gabapentin  Tobacco abuse Tobacco cessation discussed        Family Communication:  no Family at bedside  Consultants:  none  Code Status:  FULL /  DVT Prophylaxis:   Lovenox   Procedures: As Listed in Progress Note Above  Antibiotics: None        Subjective: Patient denies fevers, chills, headache, chest pain, dyspnea, nausea, vomiting, diarrhea, abdominal pain, dysuria, hematuria, hematochezia, and melena.   Objective: Vitals:   12/26/21 0400 12/26/21 0512 12/26/21 0751 12/26/21 0800  BP: 126/82   129/86  Pulse: 72  74 81  Resp: '16  12 12  '$ Temp:  97.9 F (36.6 C) 98 F (36.7 C)   TempSrc:  Oral Oral   SpO2: 97%  97% 98%  Weight:  58.6 kg    Height:        Intake/Output Summary (Last 24 hours) at 12/26/2021 1057 Last data filed at 12/26/2021 0200 Gross per 24 hour  Intake 2067.15 ml  Output 950 ml  Net 1117.15 ml   Weight  change:  Exam:  General:  Pt is alert, follows commands appropriately, not in acute distress HEENT: No icterus, No thrush, No neck mass, [place]/AT Cardiovascular: RRR, S1/S2, no rubs, no gallops Respiratory: CTA bilaterally, no wheezing, no crackles, no rhonchi Abdomen: Soft/+BS, non tender, non distended, no guarding Extremities: No edema, No lymphangitis, No petechiae, No rashes, no synovitis   Data Reviewed: I  have personally reviewed following labs and imaging studies Basic Metabolic Panel: Recent Labs  Lab 12/25/21 1742 12/25/21 2101 12/26/21 0116 12/26/21 0455 12/26/21 0841  NA 140 139 134* 138 136  K 3.4* 3.3* 3.3* 3.2* 3.3*  CL 99 100 101 99 98  CO2 '25 24 27 28 28  '$ GLUCOSE 178* 186* 226* 179* 165*  BUN 45* 39* 35* 32* 29*  CREATININE 1.39* 1.23 1.22 1.13 1.01  CALCIUM 8.5* 8.4* 7.9* 8.1* 7.9*   Liver Function Tests: Recent Labs  Lab 12/25/21 0907  AST 16  ALT 22  ALKPHOS 117  BILITOT 2.3*  PROT 7.8  ALBUMIN 4.3   Recent Labs  Lab 12/25/21 0907  LIPASE 24   No results for input(s): "AMMONIA" in the last 168 hours. Coagulation Profile: Recent Labs  Lab 12/25/21 0920  INR 1.1   CBC: Recent Labs  Lab 12/25/21 0907 12/26/21 0846  WBC 21.1* 14.4*  NEUTROABS 18.4*  --   HGB 17.4* 12.8*  HCT 54.1* 38.9*  MCV 88.4 88.4  PLT 424* 247   Cardiac Enzymes: No results for input(s): "CKTOTAL", "CKMB", "CKMBINDEX", "TROPONINI" in the last 168 hours. BNP: Invalid input(s): "POCBNP" CBG: Recent Labs  Lab 12/26/21 0545 12/26/21 0650 12/26/21 0752 12/26/21 0913 12/26/21 1020  GLUCAP 172* 172* 137* 168* 186*   HbA1C: No results for input(s): "HGBA1C" in the last 72 hours. Urine analysis:    Component Value Date/Time   COLORURINE STRAW (A) 12/25/2021 1032   APPEARANCEUR CLEAR 12/25/2021 1032   LABSPEC 1.024 12/25/2021 1032   PHURINE 5.0 12/25/2021 1032   GLUCOSEU >=500 (A) 12/25/2021 1032   HGBUR MODERATE (A) 12/25/2021 1032   BILIRUBINUR NEGATIVE 12/25/2021 1032   BILIRUBINUR neg 04/16/2014 1752   KETONESUR 80 (A) 12/25/2021 1032   PROTEINUR 100 (A) 12/25/2021 1032   UROBILINOGEN 0.2 10/17/2014 0743   NITRITE NEGATIVE 12/25/2021 1032   LEUKOCYTESUR NEGATIVE 12/25/2021 1032   Sepsis Labs: '@LABRCNTIP'$ (procalcitonin:4,lacticidven:4) ) Recent Results (from the past 240 hour(s))  SARS Coronavirus 2 by RT PCR (hospital order, performed in [place]  hospital lab) *cepheid single result test* Anterior Nasal Swab     Status: None   Collection Time: 12/25/21  9:15 AM   Specimen: Anterior Nasal Swab  Result Value Ref Range Status   SARS Coronavirus 2 by RT PCR NEGATIVE NEGATIVE Final    Comment: (NOTE) SARS-CoV-2 target nucleic acids are NOT DETECTED.  The SARS-CoV-2 RNA is generally detectable in upper and lower respiratory specimens during the acute phase of infection. The lowest concentration of SARS-CoV-2 viral copies this assay can detect is 250 copies / mL. A negative result does not preclude SARS-CoV-2 infection and should not be used as the sole basis for treatment or other patient management decisions.  A negative result may occur with improper specimen collection / handling, submission of specimen other than nasopharyngeal swab, presence of viral mutation(s) within the areas targeted by this assay, and inadequate number of viral copies (<250 copies / mL). A negative result must be combined with clinical observations, patient history, and epidemiological information.  Fact Sheet for Patients:   https://www.patel.info/  Fact Sheet for Healthcare Providers: https://hall.com/  This test is not yet approved or  cleared by the [place] FDA and has been authorized for detection and/or diagnosis of SARS-CoV-2 by FDA under an Emergency Use Authorization (EUA).  This EUA will remain in effect (meaning this test can be used) for the duration of the COVID-19 declaration under Section 564(b)(1) of the Act, 21 U.S.C. section 360bbb-3(b)(1), unless the authorization is terminated or revoked sooner.  Performed at [facility], [address] [place], [place] [address]   Blood Culture (routine x 2)     Status: None (Preliminary result)   Collection Time: 12/25/21  9:20 AM   Specimen: Left Antecubital; Blood  Result Value Ref Range Status   Specimen Description   Final    LEFT  ANTECUBITAL BOTTLES DRAWN AEROBIC AND ANAEROBIC   Special Requests   Final    Blood Culture results may not be optimal due to an inadequate volume of blood received in culture bottles   Culture   Final    NO GROWTH < 24 HOURS Performed at [facility], [address], [place], [place] [address]    Report Status PENDING  Incomplete  Blood Culture (routine x 2)     Status: None (Preliminary result)   Collection Time: 12/25/21 10:27 AM   Specimen: BLOOD LEFT HAND  Result Value Ref Range Status   Specimen Description   Final    BLOOD LEFT HAND BOTTLES DRAWN AEROBIC AND ANAEROBIC   Special Requests Blood Culture adequate volume  Final   Culture   Final    NO GROWTH < 24 HOURS Performed at [facility], [address] [place], [place] [address]    Report Status PENDING  Incomplete  MRSA Next Gen by PCR, Nasal     Status: None   Collection Time: 12/25/21  3:01 PM   Specimen: Nasal Mucosa; Nasal Swab  Result Value Ref Range Status   MRSA by PCR Next Gen NOT DETECTED NOT DETECTED Final    Comment: (NOTE) The GeneXpert MRSA Assay (FDA approved for NASAL specimens only), is one component of a comprehensive MRSA colonization surveillance program. It is not intended to diagnose MRSA infection nor to guide or monitor treatment for MRSA infections. Test performance is not FDA approved in patients less than 54 years old. Performed at [facility], [address], [place],  [address]      Scheduled Meds:  Chlorhexidine Gluconate Cloth  6 each Topical Daily   gabapentin  300 mg Oral QHS   insulin aspart  0-5 Units Subcutaneous QHS   insulin aspart  0-9 Units Subcutaneous TID WC   insulin glargine-yfgn  10 Units Subcutaneous Daily   lamoTRIgine  25 mg Oral BID   Continuous Infusions:  dextrose 5% lactated ringers 125 mL/hr at 12/25/21 2248   dextrose 5% lactated ringers     lactated ringers Stopped (12/25/21 1441)   lactated ringers      Procedures/Studies: DG Chest Portable 1  View  Result Date: 12/25/2021 CLINICAL DATA:  Cough EXAM: PORTABLE CHEST 1 VIEW COMPARISON:  03/05/2021 FINDINGS: Normal heart size. Postsurgical changes in the right suprahilar region. No focal airspace consolidation, pleural effusion, or pneumothorax. IMPRESSION: No active disease. Electronically Signed   By: Davina Poke D.O.   On: 12/25/2021 09:33   CT HEAD WO CONTRAST (5MM)  Result Date: 11/29/2021 CLINICAL DATA:  Possible seizure EXAM: CT HEAD WITHOUT CONTRAST TECHNIQUE: Contiguous axial images were obtained from the base of the skull  through the vertex without intravenous contrast. RADIATION DOSE REDUCTION: This exam was performed according to the departmental dose-optimization program which includes automated exposure control, adjustment of the mA and/or kV according to patient size and/or use of iterative reconstruction technique. COMPARISON:  10/30/2013 FINDINGS: Brain: No evidence of acute infarction, hemorrhage, mass, mass effect, or midline shift. No hydrocephalus or extra-axial fluid collection. Vascular: No hyperdense vessel. Skull: Normal. Negative for fracture or focal lesion. Sinuses/Orbits: Mucosal thickening in the imaged left maxillary sinus and bilateral ethmoid air cells and inferior frontal sinuses. The orbits are unremarkable. Other: The mastoid air cells are well aerated. IMPRESSION: No acute intracranial process. Electronically Signed   By: Merilyn Baba M.D.   On: 11/29/2021 23:56    Orson Eva, DO  [facility]  If 7PM-7AM, please contact night-coverage [email or web address] Password [email or web address] 12/26/2021, 10:57 AM   LOS: 1 day

## 2021-12-26 NOTE — Discharge Summary (Signed)
Physician Discharge Summary   Patient: Pedro [name] MRN: [id number] DOB: 11-11-1995  Admit date:     12/25/2021  Discharge date: 12/26/21  Discharge Physician: Shanon Brow Layth Cerezo   PCP: Neale Burly, MD   Patient left against medical advice.  -he informed the nurse that he wanted to leave -he did not want to wait for me to come speak with me    Hospital Course: 26 year old male with a history of polysubstance abuse, diabetes mellitus type 1, hypertension, diabetic neuropathy presenting with 3-day history of generalized weakness, fatigue, malaise, and associated nausea and vomiting.  The patient states that family members have been suffering from URI type symptoms about 4 days prior to this admission.  The patient states that he began developing URI type symptoms about 3 days prior to coming this admission.  However, he denies any chest pain, shortness of breath, hemoptysis, diarrhea, abdominal pain, hematochezia, melena.  There is no hematemesis.  There is no dysuria or hematuria. He states that he has not been compliant with his insulin.  He last took insulin on the morning of admission.  Prior to that he has only been using it about twice per week.  He states that he last used cocaine about 3 days prior to admission.  He denies any alcohol.  He smokes 1 pack/day. In the ED, the patient was afebrile but tachycardic and hemodynamically stable.  Oxygen saturation 100% room air.  WBC 21.1, hemoglobin 17.4, platelets 424.  BMP shows sodium 134, potassium 4.5, bicarbonate 18, serum glucose of 96, serum creatinine 2.16.  Lactic acid 3.0>> 1.5.  UDS positive for cocaine and THC.  EKG shows sinus tachycardia with nonspecific ST changes.  Chest x-ray was negative for infiltrates or edema.  UA negative for pyuria.  Blood cultures were obtained and the patient was given empiric ceftriaxone and azithromycin.  The patient was started on IV insulin and IV fluids.  Assessment and Plan: * SIRS (systemic  inflammatory response syndrome) (HCC) Patient presented with leukocytosis, tachycardia, and AKI Likely related to his point depletion resulting from DKA -UA negative for pyuria -Chest x-ray negative for infiltrates -Follow blood cultures-neg -sepsis ruled out  Polysubstance dependence (HCC) Including cocaine, THC, tobacco  AKI (acute kidney injury) ([place]) Baseline creatinine 1.1-1.3 Presented with serum creatinine 2.16 Secondary to volume depletion Continue IV fluids  DKA, type 1 ([place]) -patient started on IV insulin with q 1 hour CBG check and q 4 hour BMPs -pt started on aggressive fluid resuscitation -Electrolytes were monitored and repleted -transitioned to [place] insulin once anion gap closed -diet was advanced once anion gap closed -08/09/2021 HbA1C--9.7 -12/26/21--transition to [place] insulin   Diabetic neuropathy ([place]) Continue gabapentin  Tobacco abuse Tobacco cessation discussed         Consultants: none Procedures performed: none  Disposition: AGAINST MEDICAL ADVICE Diet recommendation:  AMA DISCHARGE MEDICATION: Allergies as of 12/26/2021       Reactions   Sulfa Antibiotics Hives, Other (See Comments)   Reaction:  Fever        Medication List     TAKE these medications    blood glucose meter kit and supplies Dispense based on patient and insurance preference. Use up to four times daily as directed. (FOR ICD-10 E10.9, E11.9).   gabapentin 300 MG capsule Commonly known as: NEURONTIN Take 1 capsule (300 mg total) by mouth at bedtime.   insulin NPH-regular Human (70-30) 100 UNIT/ML injection Inject 10 Units into the skin 2 (two) times daily with a  meal.   lamoTRIgine 25 MG tablet Commonly known as: LAMICTAL Take 25 mg by mouth 2 (two) times daily.   lisinopril 10 MG tablet Commonly known as: ZESTRIL Take 10 mg by mouth daily.        Discharge Exam: Filed Weights   12/25/21 0906 12/25/21 1501 12/26/21 0512  Weight: 59 kg 54.4 kg 58.6 kg      Condition at discharge: AMA  The results of significant diagnostics from this hospitalization (including imaging, microbiology, ancillary and laboratory) are listed below for reference.   Imaging Studies: DG Chest Portable 1 View  Result Date: 12/25/2021 CLINICAL DATA:  Cough EXAM: PORTABLE CHEST 1 VIEW COMPARISON:  03/05/2021 FINDINGS: Normal heart size. Postsurgical changes in the right suprahilar region. No focal airspace consolidation, pleural effusion, or pneumothorax. IMPRESSION: No active disease. Electronically Signed   By: Davina Poke D.O.   On: 12/25/2021 09:33   CT HEAD WO CONTRAST (5MM)  Result Date: 11/29/2021 CLINICAL DATA:  Possible seizure EXAM: CT HEAD WITHOUT CONTRAST TECHNIQUE: Contiguous axial images were obtained from the base of the skull through the vertex without intravenous contrast. RADIATION DOSE REDUCTION: This exam was performed according to the departmental dose-optimization program which includes automated exposure control, adjustment of the mA and/or kV according to patient size and/or use of iterative reconstruction technique. COMPARISON:  10/30/2013 FINDINGS: Brain: No evidence of acute infarction, hemorrhage, mass, mass effect, or midline shift. No hydrocephalus or extra-axial fluid collection. Vascular: No hyperdense vessel. Skull: Normal. Negative for fracture or focal lesion. Sinuses/Orbits: Mucosal thickening in the imaged left maxillary sinus and bilateral ethmoid air cells and inferior frontal sinuses. The orbits are unremarkable. Other: The mastoid air cells are well aerated. IMPRESSION: No acute intracranial process. Electronically Signed   By: Merilyn Baba M.D.   On: 11/29/2021 23:56    Microbiology: Results for orders placed or performed during the hospital encounter of 12/25/21  SARS Coronavirus 2 by RT PCR (hospital order, performed in [facility] hospital lab) *cepheid single result test* Anterior Nasal Swab     Status: None   Collection  Time: 12/25/21  9:15 AM   Specimen: Anterior Nasal Swab  Result Value Ref Range Status   SARS Coronavirus 2 by RT PCR NEGATIVE NEGATIVE Final    Comment: (NOTE) SARS-CoV-2 target nucleic acids are NOT DETECTED.  The SARS-CoV-2 RNA is generally detectable in upper and lower respiratory specimens during the acute phase of infection. The lowest concentration of SARS-CoV-2 viral copies this assay can detect is 250 copies / mL. A negative result does not preclude SARS-CoV-2 infection and should not be used as the sole basis for treatment or other patient management decisions.  A negative result may occur with improper specimen collection / handling, submission of specimen other than nasopharyngeal swab, presence of viral mutation(s) within the areas targeted by this assay, and inadequate number of viral copies (<250 copies / mL). A negative result must be combined with clinical observations, patient history, and epidemiological information.  Fact Sheet for Patients:   https://www.patel.info/  Fact Sheet for Healthcare Providers: https://hall.com/  This test is not yet approved or  cleared by the [place] FDA and has been authorized for detection and/or diagnosis of SARS-CoV-2 by FDA under an Emergency Use Authorization (EUA).  This EUA will remain in effect (meaning this test can be used) for the duration of the COVID-19 declaration under Section 564(b)(1) of the Act, 21 U.S.C. section 360bbb-3(b)(1), unless the authorization is terminated or revoked sooner.  Performed  at [facility], [address] [place], [place] [address]   Blood Culture (routine x 2)     Status: None (Preliminary result)   Collection Time: 12/25/21  9:20 AM   Specimen: Left Antecubital; Blood  Result Value Ref Range Status   Specimen Description   Final    LEFT ANTECUBITAL BOTTLES DRAWN AEROBIC AND ANAEROBIC   Special Requests   Final    Blood Culture results  may not be optimal due to an inadequate volume of blood received in culture bottles   Culture   Final    NO GROWTH < 24 HOURS Performed at [facility], [address], [place], [place] [address]    Report Status PENDING  Incomplete  Blood Culture (routine x 2)     Status: None (Preliminary result)   Collection Time: 12/25/21 10:27 AM   Specimen: BLOOD LEFT HAND  Result Value Ref Range Status   Specimen Description   Final    BLOOD LEFT HAND BOTTLES DRAWN AEROBIC AND ANAEROBIC   Special Requests Blood Culture adequate volume  Final   Culture   Final    NO GROWTH < 24 HOURS Performed at [facility], [address], [place], [place] [address]    Report Status PENDING  Incomplete  MRSA Next Gen by PCR, Nasal     Status: None   Collection Time: 12/25/21  3:01 PM   Specimen: Nasal Mucosa; Nasal Swab  Result Value Ref Range Status   MRSA by PCR Next Gen NOT DETECTED NOT DETECTED Final    Comment: (NOTE) The GeneXpert MRSA Assay (FDA approved for NASAL specimens only), is one component of a comprehensive MRSA colonization surveillance program. It is not intended to diagnose MRSA infection nor to guide or monitor treatment for MRSA infections. Test performance is not FDA approved in patients less than 33 years old. Performed at [facility], [address], [place],  [address]     Labs: CBC: Recent Labs  Lab 12/25/21 0907 12/26/21 0846  WBC 21.1* 14.4*  NEUTROABS 18.4*  --   HGB 17.4* 12.8*  HCT 54.1* 38.9*  MCV 88.4 88.4  PLT 424* 749   Basic Metabolic Panel: Recent Labs  Lab 12/25/21 1742 12/25/21 2101 12/26/21 0116 12/26/21 0455 12/26/21 0841  NA 140 139 134* 138 136  K 3.4* 3.3* 3.3* 3.2* 3.3*  CL 99 100 101 99 98  CO2 _0 GLUCOSE 178* 186* 226* 179* 165*  BUN 45* 39* 35* 32* 29*  CREATININE 1.39* 1.23 1.22 1.13 1.01  CALCIUM 8.5* 8.4* 7.9* 8.1* 7.9*   Liver Function Tests: Recent Labs  Lab 12/25/21 0907  AST 16  ALT 22  ALKPHOS 117   BILITOT 2.3*  PROT 7.8  ALBUMIN 4.3   CBG: Recent Labs  Lab 12/26/21 1020 12/26/21 1116 12/26/21 1301 12/26/21 1356 12/26/21 1620  GLUCAP 186* 198* 142* 139* 125*    Discharge time spent: greater than 30 minutes.  Signed: Orson Eva, MD [facility] 12/26/2021

## 2021-12-27 LAB — BASIC METABOLIC PANEL
Anion gap: 16 — ABNORMAL HIGH (ref 5–15)
BUN: 45 mg/dL — ABNORMAL HIGH (ref 6–20)
CO2: 25 mmol/L (ref 22–32)
Calcium: 8.5 mg/dL — ABNORMAL LOW (ref 8.9–10.3)
Chloride: 99 mmol/L (ref 98–111)
Creatinine, Ser: 1.39 mg/dL — ABNORMAL HIGH (ref 0.61–1.24)
GFR, Estimated: >60 mL/min (ref >60)
Glucose, Bld: 178 mg/dL — ABNORMAL HIGH (ref 70–99)
Potassium: 3.4 mmol/L — ABNORMAL LOW (ref 3.5–5.1)
Sodium: 140 mmol/L (ref 135–145)

## 2021-12-27 LAB — URINE CULTURE: Culture: NO GROWTH

## 2021-12-30 LAB — CULTURE, BLOOD (ROUTINE X 2)
Culture: NO GROWTH
Culture: NO GROWTH
Special Requests: ADEQUATE

## 2022-01-07 LAB — COMPREHENSIVE METABOLIC PANEL
ALT: 22 U/L (ref 0–44)
AST: 16 U/L (ref 15–41)
Albumin: 4.3 g/dL (ref 3.5–5.0)
Alkaline Phosphatase: 117 U/L (ref 38–126)
Anion gap: 31 — ABNORMAL HIGH (ref 5–15)
BUN: 59 mg/dL — ABNORMAL HIGH (ref 6–20)
CO2: 18 mmol/L — ABNORMAL LOW (ref 22–32)
Calcium: 9.5 mg/dL (ref 8.9–10.3)
Chloride: 85 mmol/L — ABNORMAL LOW (ref 98–111)
Creatinine, Ser: 2.16 mg/dL — ABNORMAL HIGH (ref 0.61–1.24)
GFR, Estimated: 42 mL/min — ABNORMAL LOW (ref >60)
Glucose, Bld: 796 mg/dL (ref 70–99)
Potassium: 4.5 mmol/L (ref 3.5–5.1)
Sodium: 134 mmol/L — ABNORMAL LOW (ref 135–145)
Total Bilirubin: 2.3 mg/dL — ABNORMAL HIGH (ref 0.3–1.2)
Total Protein: 7.8 g/dL (ref 6.5–8.1)

## 2022-02-12 ENCOUNTER — Other Ambulatory Visit: Payer: Self-pay

## 2022-02-12 ENCOUNTER — Inpatient Hospital Stay (HOSPITAL_COMMUNITY): Payer: Managed Care, Other (non HMO)

## 2022-02-12 ENCOUNTER — Inpatient Hospital Stay (HOSPITAL_COMMUNITY)
Admission: EM | Admit: 2022-02-12 | Discharge: 2022-02-14 | DRG: 638 | Disposition: A | Discharge status: Home / Self Care | Payer: Managed Care, Other (non HMO) | Attending: Family Medicine | Admitting: Family Medicine

## 2022-02-12 ENCOUNTER — Emergency Department (HOSPITAL_COMMUNITY): Payer: Managed Care, Other (non HMO)

## 2022-02-12 ENCOUNTER — Encounter (HOSPITAL_COMMUNITY): Payer: Self-pay

## 2022-02-12 DIAGNOSIS — Z818 Family history of other mental and behavioral disorders: Secondary | ICD-10-CM

## 2022-02-12 DIAGNOSIS — Z79899 Other long term (current) drug therapy: Secondary | ICD-10-CM

## 2022-02-12 DIAGNOSIS — Z91199 Patient's noncompliance with other medical treatment and regimen due to unspecified reason: Secondary | ICD-10-CM

## 2022-02-12 DIAGNOSIS — A419 Sepsis, unspecified organism: Secondary | ICD-10-CM | POA: Insufficient documentation

## 2022-02-12 DIAGNOSIS — Z882 Allergy status to sulfonamides status: Secondary | ICD-10-CM

## 2022-02-12 DIAGNOSIS — N179 Acute kidney failure, unspecified: Secondary | ICD-10-CM | POA: Diagnosis present

## 2022-02-12 DIAGNOSIS — Z87891 Personal history of nicotine dependence: Secondary | ICD-10-CM | POA: Diagnosis not present

## 2022-02-12 DIAGNOSIS — E871 Hypo-osmolality and hyponatremia: Secondary | ICD-10-CM | POA: Diagnosis present

## 2022-02-12 DIAGNOSIS — E875 Hyperkalemia: Secondary | ICD-10-CM | POA: Diagnosis present

## 2022-02-12 DIAGNOSIS — E860 Dehydration: Secondary | ICD-10-CM | POA: Diagnosis present

## 2022-02-12 DIAGNOSIS — E1110 Type 2 diabetes mellitus with ketoacidosis without coma: Secondary | ICD-10-CM | POA: Diagnosis present

## 2022-02-12 DIAGNOSIS — R680 Hypothermia, not associated with low environmental temperature: Secondary | ICD-10-CM | POA: Diagnosis present

## 2022-02-12 DIAGNOSIS — Z8249 Family history of ischemic heart disease and other diseases of the circulatory system: Secondary | ICD-10-CM | POA: Diagnosis not present

## 2022-02-12 DIAGNOSIS — E7800 Pure hypercholesterolemia, unspecified: Secondary | ICD-10-CM | POA: Diagnosis present

## 2022-02-12 DIAGNOSIS — Z20822 Contact with and (suspected) exposure to covid-19: Secondary | ICD-10-CM | POA: Diagnosis present

## 2022-02-12 DIAGNOSIS — T383X6A Underdosing of insulin and oral hypoglycemic [antidiabetic] drugs, initial encounter: Secondary | ICD-10-CM | POA: Diagnosis present

## 2022-02-12 DIAGNOSIS — E1010 Type 1 diabetes mellitus with ketoacidosis without coma: Principal | ICD-10-CM | POA: Diagnosis present

## 2022-02-12 DIAGNOSIS — E109 Type 1 diabetes mellitus without complications: Secondary | ICD-10-CM | POA: Diagnosis present

## 2022-02-12 DIAGNOSIS — F1910 Other psychoactive substance abuse, uncomplicated: Secondary | ICD-10-CM | POA: Diagnosis present

## 2022-02-12 DIAGNOSIS — T68XXXA Hypothermia, initial encounter: Secondary | ICD-10-CM

## 2022-02-12 DIAGNOSIS — K3184 Gastroparesis: Secondary | ICD-10-CM | POA: Diagnosis present

## 2022-02-12 DIAGNOSIS — E1043 Type 1 diabetes mellitus with diabetic autonomic (poly)neuropathy: Secondary | ICD-10-CM | POA: Diagnosis present

## 2022-02-12 DIAGNOSIS — Z794 Long term (current) use of insulin: Secondary | ICD-10-CM

## 2022-02-12 DIAGNOSIS — I10 Essential (primary) hypertension: Secondary | ICD-10-CM | POA: Diagnosis present

## 2022-02-12 DIAGNOSIS — E1140 Type 2 diabetes mellitus with diabetic neuropathy, unspecified: Secondary | ICD-10-CM | POA: Diagnosis present

## 2022-02-12 DIAGNOSIS — Z833 Family history of diabetes mellitus: Secondary | ICD-10-CM

## 2022-02-12 DIAGNOSIS — E876 Hypokalemia: Secondary | ICD-10-CM | POA: Insufficient documentation

## 2022-02-12 DIAGNOSIS — F909 Attention-deficit hyperactivity disorder, unspecified type: Secondary | ICD-10-CM | POA: Diagnosis present

## 2022-02-12 DIAGNOSIS — F1920 Other psychoactive substance dependence, uncomplicated: Secondary | ICD-10-CM | POA: Diagnosis present

## 2022-02-12 LAB — BASIC METABOLIC PANEL
Anion gap: 28 — ABNORMAL HIGH (ref 5–15)
Anion gap: 11 (ref 5–15)
Anion gap: 9 (ref 5–15)
BUN: 50 mg/dL — ABNORMAL HIGH (ref 6–20)
BUN: 42 mg/dL — ABNORMAL HIGH (ref 6–20)
BUN: 35 mg/dL — ABNORMAL HIGH (ref 6–20)
CO2: 8 mmol/L — ABNORMAL LOW (ref 22–32)
CO2: 22 mmol/L (ref 22–32)
CO2: 24 mmol/L (ref 22–32)
Calcium: 7.9 mg/dL — ABNORMAL LOW (ref 8.9–10.3)
Calcium: 8 mg/dL — ABNORMAL LOW (ref 8.9–10.3)
Calcium: 7.5 mg/dL — ABNORMAL LOW (ref 8.9–10.3)
Chloride: 93 mmol/L — ABNORMAL LOW (ref 98–111)
Chloride: 104 mmol/L (ref 98–111)
Chloride: 103 mmol/L (ref 98–111)
Creatinine, Ser: 2.28 mg/dL — ABNORMAL HIGH (ref 0.61–1.24)
Creatinine, Ser: 1.34 mg/dL — ABNORMAL HIGH (ref 0.61–1.24)
Creatinine, Ser: 1.08 mg/dL (ref 0.61–1.24)
GFR, Estimated: 39 mL/min — ABNORMAL LOW (ref >60)
GFR, Estimated: >60 mL/min (ref >60)
GFR, Estimated: >60 mL/min (ref >60)
Glucose, Bld: 813 mg/dL (ref 70–99)
Glucose, Bld: 329 mg/dL — ABNORMAL HIGH (ref 70–99)
Glucose, Bld: 176 mg/dL — ABNORMAL HIGH (ref 70–99)
Potassium: 5.7 mmol/L — ABNORMAL HIGH (ref 3.5–5.1)
Potassium: 3.9 mmol/L (ref 3.5–5.1)
Potassium: 3.2 mmol/L — ABNORMAL LOW (ref 3.5–5.1)
Sodium: 129 mmol/L — ABNORMAL LOW (ref 135–145)
Sodium: 137 mmol/L (ref 135–145)
Sodium: 136 mmol/L (ref 135–145)

## 2022-02-12 LAB — POCT I-STAT, CHEM 8
BUN: 48 mg/dL — ABNORMAL HIGH (ref 6–20)
Calcium, Ion: 1.05 mmol/L — ABNORMAL LOW (ref 1.15–1.40)
Chloride: 88 mmol/L — ABNORMAL LOW (ref 98–111)
Creatinine, Ser: 1.9 mg/dL — ABNORMAL HIGH (ref 0.61–1.24)
Glucose, Bld: >700 mg/dL (ref 70–99)
HCT: 51 % (ref 39.0–52.0)
Hemoglobin: 17.3 g/dL — ABNORMAL HIGH (ref 13.0–17.0)
Potassium: 6.3 mmol/L (ref 3.5–5.1)
Sodium: 116 mmol/L — CL (ref 135–145)
TCO2: 8 mmol/L — ABNORMAL LOW (ref 22–32)

## 2022-02-12 LAB — PROTIME-INR
INR: 1.2 (ref 0.8–1.2)
Prothrombin Time: 15.4 seconds — ABNORMAL HIGH (ref 11.4–15.2)

## 2022-02-12 LAB — COMPREHENSIVE METABOLIC PANEL
ALT: 19 U/L (ref 0–44)
AST: 47 U/L — ABNORMAL HIGH (ref 15–41)
Albumin: 3.1 g/dL — ABNORMAL LOW (ref 3.5–5.0)
Alkaline Phosphatase: 103 U/L (ref 38–126)
Anion gap: 28 — ABNORMAL HIGH (ref 5–15)
BUN: 51 mg/dL — ABNORMAL HIGH (ref 6–20)
CO2: 10 mmol/L — ABNORMAL LOW (ref 22–32)
Calcium: 8 mg/dL — ABNORMAL LOW (ref 8.9–10.3)
Chloride: 95 mmol/L — ABNORMAL LOW (ref 98–111)
Creatinine, Ser: 2.2 mg/dL — ABNORMAL HIGH (ref 0.61–1.24)
GFR, Estimated: 41 mL/min — ABNORMAL LOW (ref >60)
Glucose, Bld: 701 mg/dL (ref 70–99)
Potassium: 4.2 mmol/L (ref 3.5–5.1)
Sodium: 133 mmol/L — ABNORMAL LOW (ref 135–145)
Total Bilirubin: 2.1 mg/dL — ABNORMAL HIGH (ref 0.3–1.2)
Total Protein: 5.7 g/dL — ABNORMAL LOW (ref 6.5–8.1)

## 2022-02-12 LAB — BLOOD GAS, ARTERIAL
Acid-base deficit: 19.4 mmol/L — ABNORMAL HIGH (ref 0.0–2.0)
Bicarbonate: 6 mmol/L — ABNORMAL LOW (ref 20.0–28.0)
Drawn by: 26016
FIO2: 100 %
O2 Saturation: 95.5 %
Patient temperature: 36.3
pCO2 arterial: <18 mmHg — CL (ref 32–48)
pH, Arterial: 7.22 — ABNORMAL LOW (ref 7.35–7.45)
pO2, Arterial: 67 mmHg — ABNORMAL LOW (ref 83–108)

## 2022-02-12 LAB — RAPID URINE DRUG SCREEN, HOSP PERFORMED
Amphetamines: NOT DETECTED
Barbiturates: NOT DETECTED
Benzodiazepines: NOT DETECTED
Cocaine: NOT DETECTED
Opiates: NOT DETECTED
Tetrahydrocannabinol: POSITIVE — AB

## 2022-02-12 LAB — LACTIC ACID, PLASMA
Lactic Acid, Venous: 4.3 mmol/L (ref 0.5–1.9)
Lactic Acid, Venous: 2.4 mmol/L (ref 0.5–1.9)
Lactic Acid, Venous: 2 mmol/L (ref 0.5–1.9)

## 2022-02-12 LAB — GLUCOSE, CAPILLARY
Glucose-Capillary: 154 mg/dL — ABNORMAL HIGH (ref 70–99)
Glucose-Capillary: 169 mg/dL — ABNORMAL HIGH (ref 70–99)
Glucose-Capillary: 172 mg/dL — ABNORMAL HIGH (ref 70–99)
Glucose-Capillary: 182 mg/dL — ABNORMAL HIGH (ref 70–99)
Glucose-Capillary: 211 mg/dL — ABNORMAL HIGH (ref 70–99)
Glucose-Capillary: 258 mg/dL — ABNORMAL HIGH (ref 70–99)
Glucose-Capillary: 386 mg/dL — ABNORMAL HIGH (ref 70–99)
Glucose-Capillary: 395 mg/dL — ABNORMAL HIGH (ref 70–99)
Glucose-Capillary: 405 mg/dL — ABNORMAL HIGH (ref 70–99)
Glucose-Capillary: 419 mg/dL — ABNORMAL HIGH (ref 70–99)
Glucose-Capillary: 466 mg/dL — ABNORMAL HIGH (ref 70–99)
Glucose-Capillary: 495 mg/dL — ABNORMAL HIGH (ref 70–99)
Glucose-Capillary: 514 mg/dL (ref 70–99)
Glucose-Capillary: 563 mg/dL (ref 70–99)
Glucose-Capillary: >600 mg/dL (ref 70–99)

## 2022-02-12 LAB — URINALYSIS, ROUTINE W REFLEX MICROSCOPIC
Bilirubin Urine: NEGATIVE
Glucose, UA: >=500 mg/dL — AB
Ketones, ur: 80 mg/dL — AB
Leukocytes,Ua: NEGATIVE
Nitrite: NEGATIVE
Protein, ur: 30 mg/dL — AB
Specific Gravity, Urine: 1.018 (ref 1.005–1.030)
pH: 5 (ref 5.0–8.0)

## 2022-02-12 LAB — CBG MONITORING, ED
Glucose-Capillary: >600 mg/dL (ref 70–99)
Glucose-Capillary: >600 mg/dL (ref 70–99)
Glucose-Capillary: >600 mg/dL (ref 70–99)
Glucose-Capillary: >600 mg/dL (ref 70–99)
Glucose-Capillary: >600 mg/dL (ref 70–99)
Glucose-Capillary: >600 mg/dL (ref 70–99)

## 2022-02-12 LAB — PROCALCITONIN: Procalcitonin: 19.47 ng/mL

## 2022-02-12 LAB — CBC WITH DIFFERENTIAL/PLATELET
Abs Immature Granulocytes: 1.13 10*3/uL — ABNORMAL HIGH (ref 0.00–0.07)
Basophils Absolute: 0.1 10*3/uL (ref 0.0–0.1)
Basophils Relative: 0 %
Eosinophils Absolute: 0.1 10*3/uL (ref 0.0–0.5)
Eosinophils Relative: 0 %
HCT: 48.6 % (ref 39.0–52.0)
Hemoglobin: 14.4 g/dL (ref 13.0–17.0)
Immature Granulocytes: 3 %
Lymphocytes Relative: 18 %
Lymphs Abs: 6.7 10*3/uL — ABNORMAL HIGH (ref 0.7–4.0)
MCH: 29.7 pg (ref 26.0–34.0)
MCHC: 29.6 g/dL — ABNORMAL LOW (ref 30.0–36.0)
MCV: 100.2 fL — ABNORMAL HIGH (ref 80.0–100.0)
Monocytes Absolute: 1.9 10*3/uL — ABNORMAL HIGH (ref 0.1–1.0)
Monocytes Relative: 5 %
Neutro Abs: 28.4 10*3/uL — ABNORMAL HIGH (ref 1.7–7.7)
Neutrophils Relative %: 74 %
Platelets: 403 10*3/uL — ABNORMAL HIGH (ref 150–400)
RBC: 4.85 MIL/uL (ref 4.22–5.81)
RDW: 13.6 % (ref 11.5–15.5)
WBC: 38.3 10*3/uL — ABNORMAL HIGH (ref 4.0–10.5)
nRBC: 0 % (ref 0.0–0.2)

## 2022-02-12 LAB — ETHANOL: Alcohol, Ethyl (B): <10 mg/dL (ref <10)

## 2022-02-12 LAB — RESP PANEL BY RT-PCR (FLU A&B, COVID) ARPGX2
Influenza A by PCR: NEGATIVE
Influenza B by PCR: NEGATIVE
SARS Coronavirus 2 by RT PCR: NEGATIVE

## 2022-02-12 LAB — BETA-HYDROXYBUTYRIC ACID
Beta-Hydroxybutyric Acid: >8 mmol/L — ABNORMAL HIGH (ref 0.05–0.27)
Beta-Hydroxybutyric Acid: 7.5 mmol/L — ABNORMAL HIGH (ref 0.05–0.27)
Beta-Hydroxybutyric Acid: 1.56 mmol/L — ABNORMAL HIGH (ref 0.05–0.27)

## 2022-02-12 LAB — PHOSPHORUS
Phosphorus: 11.2 mg/dL — ABNORMAL HIGH (ref 2.5–4.6)
Phosphorus: 4.5 mg/dL (ref 2.5–4.6)
Phosphorus: 1 mg/dL — CL (ref 2.5–4.6)

## 2022-02-12 LAB — BLOOD GAS, VENOUS
Acid-base deficit: 27 mmol/L — ABNORMAL HIGH (ref 0.0–2.0)
Bicarbonate: 4.2 mmol/L — ABNORMAL LOW (ref 20.0–28.0)
Drawn by: 66460
O2 Saturation: 87.7 %
Patient temperature: 32.6
pCO2, Ven: <18 mmHg — CL (ref 44–60)
pH, Ven: 6.98 — CL (ref 7.25–7.43)
pO2, Ven: 47 mmHg — ABNORMAL HIGH (ref 32–45)

## 2022-02-12 LAB — MAGNESIUM
Magnesium: 2.8 mg/dL — ABNORMAL HIGH (ref 1.7–2.4)
Magnesium: 2.2 mg/dL (ref 1.7–2.4)
Magnesium: 1.6 mg/dL — ABNORMAL LOW (ref 1.7–2.4)

## 2022-02-12 LAB — MRSA NEXT GEN BY PCR, NASAL: MRSA by PCR Next Gen: NOT DETECTED

## 2022-02-12 LAB — TROPONIN I (HIGH SENSITIVITY)
Troponin I (High Sensitivity): 90 ng/L — ABNORMAL HIGH (ref <18)
Troponin I (High Sensitivity): 133 ng/L (ref <18)

## 2022-02-12 LAB — APTT: aPTT: 23 seconds — ABNORMAL LOW (ref 24–36)

## 2022-02-12 MED ORDER — SODIUM CHLORIDE 0.9 % IV BOLUS
1000.0000 mL | Freq: Once | INTRAVENOUS | Status: AC
  Administered 2022-02-12: 1000 mL via INTRAVENOUS

## 2022-02-12 MED ORDER — MORPHINE SULFATE (PF) 2 MG/ML IV SOLN: 2.0000 mg | INTRAVENOUS | Status: DC

## 2022-02-12 MED ORDER — STERILE WATER FOR INJECTION IV SOLN
INTRAVENOUS | Status: DC
  Filled 2022-02-12 (×2): qty 1000

## 2022-02-12 MED ORDER — SODIUM CHLORIDE 0.9 % IV SOLN
2.0000 g | Freq: Two times a day (BID) | INTRAVENOUS | Status: DC
  Administered 2022-02-12 – 2022-02-14 (×4): 2 g via INTRAVENOUS
  Filled 2022-02-12 (×4): qty 12.5

## 2022-02-12 MED ORDER — HYDROMORPHONE HCL 1 MG/ML IJ SOLN: 1.0000 mg | INTRAMUSCULAR | Status: DC | PRN

## 2022-02-12 MED ORDER — IPRATROPIUM BROMIDE 0.02 % IN SOLN: 0.5000 mg | Freq: Four times a day (QID) | RESPIRATORY_TRACT | Status: DC | PRN

## 2022-02-12 MED ORDER — OXYCODONE HCL 5 MG PO TABS: 5.0000 mg | ORAL_TABLET | ORAL | Status: DC | PRN

## 2022-02-12 MED ORDER — HYDROMORPHONE HCL 1 MG/ML IJ SOLN: 0.5000 mg | INTRAMUSCULAR | Status: DC | PRN

## 2022-02-12 MED ORDER — MORPHINE SULFATE (PF) 2 MG/ML IV SOLN
2.0000 mg | INTRAVENOUS | Status: AC
  Administered 2022-02-12: 2 mg via INTRAVENOUS
  Filled 2022-02-12: qty 1

## 2022-02-12 MED ORDER — BISACODYL 5 MG PO TBEC: 5.0000 mg | DELAYED_RELEASE_TABLET | Freq: Every day | ORAL | Status: DC | PRN

## 2022-02-12 MED ORDER — SENNOSIDES-DOCUSATE SODIUM 8.6-50 MG PO TABS: 1.0000 | ORAL_TABLET | Freq: Every evening | ORAL | Status: DC | PRN

## 2022-02-12 MED ORDER — GABAPENTIN 300 MG PO CAPS
300.0000 mg | ORAL_CAPSULE | Freq: Every day | ORAL | Status: DC
  Administered 2022-02-12 – 2022-02-13 (×2): 300 mg via ORAL
  Filled 2022-02-12 (×2): qty 1

## 2022-02-12 MED ORDER — DEXTROSE 50 % IV SOLN: 0.0000 mL | INTRAVENOUS | Status: DC | PRN

## 2022-02-12 MED ORDER — ONDANSETRON HCL 4 MG/2ML IJ SOLN
4.0000 mg | Freq: Four times a day (QID) | INTRAMUSCULAR | Status: DC | PRN
  Filled 2022-02-12: qty 2

## 2022-02-12 MED ORDER — MORPHINE SULFATE (PF) 2 MG/ML IV SOLN
INTRAVENOUS | Status: AC
  Administered 2022-02-12: 2 mg via INTRAVENOUS
  Filled 2022-02-12: qty 1

## 2022-02-12 MED ORDER — LACTATED RINGERS IV BOLUS
40.0000 mL/kg | Freq: Once | INTRAVENOUS | Status: AC
  Administered 2022-02-12: 2340 mL via INTRAVENOUS

## 2022-02-12 MED ORDER — SODIUM CHLORIDE 0.9 % IV SOLN: INTRAVENOUS | Status: DC

## 2022-02-12 MED ORDER — NITROGLYCERIN 0.4 MG SL SUBL
0.4000 mg | SUBLINGUAL_TABLET | SUBLINGUAL | Status: AC | PRN
  Administered 2022-02-12 (×3): 0.4 mg via SUBLINGUAL
  Filled 2022-02-12: qty 1

## 2022-02-12 MED ORDER — METOPROLOL TARTRATE 5 MG/5ML IV SOLN
5.0000 mg | Freq: Once | INTRAVENOUS | Status: AC
  Administered 2022-02-12: 5 mg via INTRAVENOUS
  Filled 2022-02-12: qty 5

## 2022-02-12 MED ORDER — DEXTROSE IN LACTATED RINGERS 5 % IV SOLN: INTRAVENOUS | Status: DC

## 2022-02-12 MED ORDER — INSULIN DETEMIR 100 UNIT/ML ~~LOC~~ SOLN
10.0000 [IU] | Freq: Every day | SUBCUTANEOUS | Status: DC
  Administered 2022-02-13: 10 [IU] via SUBCUTANEOUS
  Filled 2022-02-12: qty 0.1

## 2022-02-12 MED ORDER — SODIUM BICARBONATE 8.4 % IV SOLN
INTRAVENOUS | Status: DC
  Filled 2022-02-12 (×2): qty 1000

## 2022-02-12 MED ORDER — VANCOMYCIN HCL 1250 MG/250ML IV SOLN
1250.0000 mg | Freq: Once | INTRAVENOUS | Status: AC
  Administered 2022-02-12: 1250 mg via INTRAVENOUS
  Filled 2022-02-12: qty 250

## 2022-02-12 MED ORDER — LAMOTRIGINE 25 MG PO TABS
25.0000 mg | ORAL_TABLET | Freq: Two times a day (BID) | ORAL | Status: DC
  Administered 2022-02-13 – 2022-02-14 (×2): 25 mg via ORAL
  Filled 2022-02-12 (×8): qty 1

## 2022-02-12 MED ORDER — INSULIN ASPART PROT & ASPART (70-30 MIX) 100 UNIT/ML ~~LOC~~ SUSP
20.0000 [IU] | Freq: Two times a day (BID) | SUBCUTANEOUS | Status: DC
  Filled 2022-02-12: qty 10

## 2022-02-12 MED ORDER — PNEUMOCOCCAL VAC POLYVALENT 25 MCG/0.5ML IJ INJ: 0.5000 mL | INJECTION | INTRAMUSCULAR | Status: DC | PRN

## 2022-02-12 MED ORDER — LACTATED RINGERS IV SOLN: INTRAVENOUS | Status: DC

## 2022-02-12 MED ORDER — SODIUM CHLORIDE 0.9% FLUSH: 3.0000 mL | INTRAVENOUS | Status: DC | PRN

## 2022-02-12 MED ORDER — FLEET ENEMA 7-19 GM/118ML RE ENEM: 1.0000 | ENEMA | Freq: Once | RECTAL | Status: DC | PRN

## 2022-02-12 MED ORDER — NITROGLYCERIN 0.4 MG SL SUBL
SUBLINGUAL_TABLET | SUBLINGUAL | Status: AC
  Filled 2022-02-12: qty 1

## 2022-02-12 MED ORDER — MORPHINE SULFATE (PF) 2 MG/ML IV SOLN: 2.0000 mg | INTRAVENOUS | Status: DC | PRN

## 2022-02-12 MED ORDER — SODIUM CHLORIDE 0.9 % IV SOLN: 250.0000 mL | INTRAVENOUS | Status: DC | PRN

## 2022-02-12 MED ORDER — LACTATED RINGERS IV BOLUS: 1000.0000 mL | Freq: Once | INTRAVENOUS | Status: DC

## 2022-02-12 MED ORDER — SODIUM BICARBONATE 8.4 % IV SOLN
INTRAVENOUS | Status: AC
  Administered 2022-02-12: 100 meq via INTRAVENOUS
  Filled 2022-02-12: qty 100

## 2022-02-12 MED ORDER — INSULIN ASPART 100 UNIT/ML IJ SOLN
0.0000 [IU] | Freq: Four times a day (QID) | INTRAMUSCULAR | Status: DC
  Administered 2022-02-12: 3 [IU] via SUBCUTANEOUS
  Administered 2022-02-13: 2 [IU] via SUBCUTANEOUS
  Administered 2022-02-14 (×2): 5 [IU] via SUBCUTANEOUS

## 2022-02-12 MED ORDER — ONDANSETRON HCL 4 MG PO TABS: 4.0000 mg | ORAL_TABLET | Freq: Four times a day (QID) | ORAL | Status: DC | PRN

## 2022-02-12 MED ORDER — LACTATED RINGERS IV BOLUS
20.0000 mL/kg | Freq: Once | INTRAVENOUS | Status: AC
  Administered 2022-02-12: 1170 mL via INTRAVENOUS

## 2022-02-12 MED ORDER — SODIUM BICARBONATE 8.4 % IV SOLN: 100.0000 meq | Freq: Once | INTRAVENOUS | Status: AC

## 2022-02-12 MED ORDER — SODIUM CHLORIDE 0.9 % IV SOLN
2.0000 g | Freq: Once | INTRAVENOUS | Status: AC
  Administered 2022-02-12: 2 g via INTRAVENOUS
  Filled 2022-02-12: qty 12.5

## 2022-02-12 MED ORDER — SODIUM CHLORIDE 0.9% FLUSH
3.0000 mL | Freq: Two times a day (BID) | INTRAVENOUS | Status: DC
  Administered 2022-02-12 – 2022-02-13 (×2): 3 mL via INTRAVENOUS

## 2022-02-12 MED ORDER — LEVALBUTEROL HCL 0.63 MG/3ML IN NEBU
0.6300 mg | INHALATION_SOLUTION | Freq: Four times a day (QID) | RESPIRATORY_TRACT | Status: DC | PRN
  Administered 2022-02-13: 0.63 mg via RESPIRATORY_TRACT
  Filled 2022-02-12: qty 3

## 2022-02-12 MED ORDER — HEPARIN SODIUM (PORCINE) 5000 UNIT/ML IJ SOLN
5000.0000 [IU] | Freq: Three times a day (TID) | INTRAMUSCULAR | Status: DC
  Administered 2022-02-12 – 2022-02-13 (×3): 5000 [IU] via SUBCUTANEOUS
  Filled 2022-02-12 (×4): qty 1

## 2022-02-12 MED ORDER — VANCOMYCIN HCL IN DEXTROSE 1-5 GM/200ML-% IV SOLN: 1000.0000 mg | INTRAVENOUS | Status: DC

## 2022-02-12 MED ORDER — METRONIDAZOLE 500 MG/100ML IV SOLN
500.0000 mg | Freq: Two times a day (BID) | INTRAVENOUS | Status: DC
  Administered 2022-02-12 – 2022-02-14 (×4): 500 mg via INTRAVENOUS
  Filled 2022-02-12 (×4): qty 100

## 2022-02-12 MED ORDER — INSULIN REGULAR(HUMAN) IN NACL 100-0.9 UT/100ML-% IV SOLN
INTRAVENOUS | Status: DC
  Administered 2022-02-12: 6 [IU]/h via INTRAVENOUS
  Filled 2022-02-12: qty 100

## 2022-02-12 MED ORDER — HYDRALAZINE HCL 20 MG/ML IJ SOLN: 10.0000 mg | INTRAMUSCULAR | Status: DC | PRN

## 2022-02-12 MED ORDER — NICOTINE 14 MG/24HR TD PT24
14.0000 mg | MEDICATED_PATCH | Freq: Every day | TRANSDERMAL | Status: DC
  Administered 2022-02-12 – 2022-02-14 (×3): 14 mg via TRANSDERMAL
  Filled 2022-02-12 (×4): qty 1

## 2022-02-12 MED ORDER — ACETAMINOPHEN 325 MG PO TABS: 650.0000 mg | ORAL_TABLET | Freq: Four times a day (QID) | ORAL | Status: DC | PRN

## 2022-02-12 MED ORDER — INSULIN REGULAR(HUMAN) IN NACL 100-0.9 UT/100ML-% IV SOLN
INTRAVENOUS | Status: DC
  Administered 2022-02-12: 2.4 [IU]/h via INTRAVENOUS
  Administered 2022-02-12: 6 [IU]/h via INTRAVENOUS
  Filled 2022-02-12: qty 100

## 2022-02-12 MED ORDER — ZOLPIDEM TARTRATE 5 MG PO TABS: 5.0000 mg | ORAL_TABLET | Freq: Every evening | ORAL | Status: DC | PRN

## 2022-02-12 MED ORDER — LACTATED RINGERS IV BOLUS
1160.0000 mL | Freq: Once | INTRAVENOUS | Status: AC
  Administered 2022-02-12: 1160 mL via INTRAVENOUS

## 2022-02-12 MED ORDER — ACETAMINOPHEN 650 MG RE SUPP: 650.0000 mg | Freq: Four times a day (QID) | RECTAL | Status: DC | PRN

## 2022-02-12 MED ORDER — INFLUENZA VAC SPLIT QUAD 0.5 ML IM SUSY: 0.5000 mL | PREFILLED_SYRINGE | INTRAMUSCULAR | Status: DC | PRN

## 2022-02-12 NOTE — Progress Notes (Signed)
PROGRESS NOTE    Patient: Pedro [name]                            PCP: Neale Burly, MD                    DOB: 03/06/1996            DOA: 02/12/2022 CWU:[id number]             DOS: 02/12/2022, 11:09 AM   LOS: 0 days   Date of Service: The patient was seen and examined on 02/12/2022  Subjective:   Called to the bedside as patient became more agitated, was complaining of chest pain, diaphoretic, tachypneic, tachycardic  Today's Vitals   02/12/22 1103 02/12/22 1104 02/12/22 1105 02/12/22 1107  BP: (!) 92/58     Pulse: (!) 114 (!) 115 (!) 115 (!) 117  Resp: (!) 38 (!) 35 (!) 35 (!) 32  Temp:      TempSrc:      SpO2: 95% 94% 96% 96%  Weight:      PainSc:       Body mass index is 17.99 kg/m.    Brief Narrative:   Pedro [name] is a 26 year old male with extensive history of DM1, HTN, ADHD, gastroparesis, neuropathy, history of polysubstance abuse noncompliant with severe hyperglycemia, and diabetic ketoacidotic state again. Patient presented with altered mental status, mild shortness of breath.  Started on IV fluid, insulin drip, has regained consciousness, Per ED report patient was dropped off to ED by significant other who promptly left the premises.   Multiple hospitalization for same complaint, DKA!!!!!  Compliant!  ED course:  Vitals:   02/12/22 0730 02/12/22 0800  BP: (!) 151/105 (!) 151/92  Pulse: (!) 131 (!) 134  Resp: (!) 30 (!) 30  Temp:    SpO2: 99% 97%    ABG    Component Value Date/Time   PHART 7.336 (L) 04/30/2014 1215   PCO2ART 25.0 (L) 04/30/2014 1215   PO2ART 93.4 04/30/2014 1215   HCO3 4.2 (L) 02/12/2022 0628   TCO2 11.3 04/30/2014 1215   ACIDBASEDEF 27.0 (H) 02/12/2022 0628   O2SAT 87.7 02/12/2022 0628   CBG (last 3) > 1200 Recent Labs    02/12/22 0552 02/12/22 0701 02/12/22 0738  GLUCAP >600* >600* >600*    WBC 38.3, sodium 120, potassium 6.4, chloride 78, bicarb<7, glucose>1200, BUN 30, creatinine 2.57, anion gap not  calculated GFR 34  UA Clear -Urine drug screen positive for marijuana -X-ray clear    Assessment & Plan:   Principal Problem:   DKA (diabetic ketoacidosis) (HCC) Active Problems:   Polysubstance dependence ([place])   Acute kidney injury ([place])   Sepsis ([place])   Diabetic ketoacidosis without coma associated with type 1 diabetes mellitus ([place])   Diabetic neuropathy (HCC)   Hyperkalemia   Noncompliance   Polysubstance abuse ([place])   DM type 1 (diabetes mellitus, type 1) ([place])   Hyponatremia     Assessment and Plan: * DKA (diabetic ketoacidosis) ([place]) -Patient will be admitted to ICU -Severe diabetic acidosis ABG    Component Value Date/Time   PHART 7.22 (L) 02/12/2022 1036   PCO2ART <18 (LL) 02/12/2022 1036   PO2ART 67 (L) 02/12/2022 1036   HCO3 6.0 (L) 02/12/2022 1036   TCO2 11.3 04/30/2014 1215   ACIDBASEDEF 19.4 (H) 02/12/2022 1036   O2SAT 95.5 02/12/2022 1036   -Tachypneic, tachycardic,  -N.p.o. -  Currently conscious -Diabetic ketoacidotic pathway has been activated through ED -Will continue with IV fluid resuscitation, insulin drip, bicarb drip  -CBG every hour for now,  -Checking BMP Q 4-6 hours-checking CBG every 1 hour -Severe electrolyte abnormalities, with significant anion gap, Monitoring closely, anticipating improvement with aggressive IV fluid resuscitation, insulin drip  -Monitoring closely as patient also has a hyperkalemia      Latest Ref Rng & Units 02/12/2022    9:34 AM 02/12/2022    5:56 AM 12/26/2021    8:41 AM  CMP  Glucose 70 - 99 mg/dL 813  >1,200  165   BUN 6 - 20 mg/dL 50  57  29   Creatinine 0.61 - 1.24 mg/dL 2.28  2.57  1.01   Sodium 135 - 145 mmol/L 129  120  136   Potassium 3.5 - 5.1 mmol/L 5.7  6.4  3.3   Chloride 98 - 111 mmol/L 93  78  98   CO2 22 - 32 mmol/L 8  <7  28   Calcium 8.9 - 10.3 mg/dL 7.9  8.5  7.9     -Aggressive fluid resuscitation was reinitiated with LR -Bicarb was increased 125 ml/hr  -Continue with  insulin drip  Called and discussed the case with PCCM at [facility], Dr. Chase Caller Will continue with his recommendations  Declines, or slips into, we will intubate to secure airway, Further decline anticipating transfer to [facility] ICU.Marland Kitchen        ------------------------------------------------------------------------------------------------------------------------------------------------  DVT prophylaxis:  heparin injection 5,000 Units Start: 02/12/22 1400 SCDs Start: 02/12/22 0750 TED hose Start: 02/12/22 0744 SCDs Start: 02/12/22 0744   Code Status:   Code Status: Full Code     Procedures:   No admission procedures for hospital encounter.   Antimicrobials:  Anti-infectives (From admission, onward)    None        Medication:   gabapentin  300 mg Oral QHS   heparin  5,000 Units Subcutaneous Q8H   lamoTRIgine  25 mg Oral BID   nicotine  14 mg Transdermal Daily   sodium chloride flush  3 mL Intravenous Q12H   sodium chloride flush  3 mL Intravenous Q12H    sodium chloride, acetaminophen **OR** acetaminophen, bisacodyl, dextrose, hydrALAZINE, HYDROmorphone (DILAUDID) injection, ipratropium, levalbuterol, morphine injection, ondansetron **OR** ondansetron (ZOFRAN) IV, oxyCODONE, senna-docusate, sodium chloride flush, sodium phosphate, zolpidem   Objective:   Vitals:   02/12/22 1103 02/12/22 1104 02/12/22 1105 02/12/22 1107  BP: (!) 92/58     Pulse: (!) 114 (!) 115 (!) 115 (!) 117  Resp: (!) 38 (!) 35 (!) 35 (!) 32  Temp:      TempSrc:      SpO2: 95% 94% 96% 96%  Weight:        Intake/Output Summary (Last 24 hours) at 02/12/2022 1109 Last data filed at 02/12/2022 0832 Gross per 24 hour  Intake --  Output 675 ml  Net -675 ml   Filed Weights   02/12/22 0800  Weight: 58.5 kg     Examination:   Physical Exam  Constitution: Confused agitated.  Recorded, tachypneic, diaphoretic HEENT:        Normocephalic, PERRL, otherwise with in Normal limits  Chest:          Chest symmetric Cardio vascular:  S1/S2, RRR, No murmure, No Rubs or Gallops  pulmonary: Clear to auscultation bilaterally, respirations unlabored, negative wheezes / crackles Abdomen: Soft, non-tender, non-distended, bowel sounds,no masses, no organomegaly Muscular skeletal: Limited exam - in bed, able to  move all 4 extremities,   Neuro: CNII-XII intact. , normal motor and sensation, reflexes intact  Extremities: No pitting edema lower extremities, +2 pulses  Skin: Dry, warm to touch, negative for any Rashes, No open wounds Wounds: per nursing documentation   ------------------------------------------------------------------------------------------------------------------------------------------    LABs:     Latest Ref Rng & Units 02/12/2022    5:56 AM 12/26/2021    8:46 AM 12/25/2021    9:07 AM  CBC  WBC 4.0 - 10.5 K/uL 38.3  14.4  21.1   Hemoglobin 13.0 - 17.0 g/dL 14.4  12.8  17.4   Hematocrit 39.0 - 52.0 % 48.6  38.9  54.1   Platelets 150 - 400 K/uL 403  247  424       Latest Ref Rng & Units 02/12/2022    9:34 AM 02/12/2022    5:56 AM 12/26/2021    8:41 AM  CMP  Glucose 70 - 99 mg/dL 813  >1,200  165   BUN 6 - 20 mg/dL 50  57  29   Creatinine 0.61 - 1.24 mg/dL 2.28  2.57  1.01   Sodium 135 - 145 mmol/L 129  120  136   Potassium 3.5 - 5.1 mmol/L 5.7  6.4  3.3   Chloride 98 - 111 mmol/L 93  78  98   CO2 22 - 32 mmol/L 8  <7  28   Calcium 8.9 - 10.3 mg/dL 7.9  8.5  7.9        Micro Results Recent Results (from the past 240 hour(s))  Resp Panel by RT-PCR (Flu A&B, Covid) Anterior Nasal Swab     Status: None   Collection Time: 02/12/22  6:10 AM   Specimen: Anterior Nasal Swab  Result Value Ref Range Status   SARS Coronavirus 2 by RT PCR NEGATIVE NEGATIVE Final    Comment: (NOTE) SARS-CoV-2 target nucleic acids are NOT DETECTED.  The SARS-CoV-2 RNA is generally detectable in upper respiratory specimens during the acute phase of infection. The  lowest concentration of SARS-CoV-2 viral copies this assay can detect is 138 copies/mL. A negative result does not preclude SARS-Cov-2 infection and should not be used as the sole basis for treatment or other patient management decisions. A negative result may occur with  improper specimen collection/handling, submission of specimen other than nasopharyngeal swab, presence of viral mutation(s) within the areas targeted by this assay, and inadequate number of viral copies(<138 copies/mL). A negative result must be combined with clinical observations, patient history, and epidemiological information. The expected result is Negative.  Fact Sheet for Patients:  EntrepreneurPulse.com.au  Fact Sheet for Healthcare Providers:  IncredibleEmployment.be  This test is no t yet approved or cleared by the [place] FDA and  has been authorized for detection and/or diagnosis of SARS-CoV-2 by FDA under an Emergency Use Authorization (EUA). This EUA will remain  in effect (meaning this test can be used) for the duration of the COVID-19 declaration under Section 564(b)(1) of the Act, 21 U.S.C.section 360bbb-3(b)(1), unless the authorization is terminated  or revoked sooner.       Influenza A by PCR NEGATIVE NEGATIVE Final   Influenza B by PCR NEGATIVE NEGATIVE Final    Comment: (NOTE) The Xpert Xpress SARS-CoV-2/FLU/RSV plus assay is intended as an aid in the diagnosis of influenza from Nasopharyngeal swab specimens and should not be used as a sole basis for treatment. Nasal washings and aspirates are unacceptable for Xpert Xpress SARS-CoV-2/FLU/RSV testing.  Fact Sheet for Patients: EntrepreneurPulse.com.au  Fact Sheet for Healthcare Providers: IncredibleEmployment.be  This test is not yet approved or cleared by the [place] FDA and has been authorized for detection and/or diagnosis of SARS-CoV-2 by FDA under  an Emergency Use Authorization (EUA). This EUA will remain in effect (meaning this test can be used) for the duration of the COVID-19 declaration under Section 564(b)(1) of the Act, 21 U.S.C. section 360bbb-3(b)(1), unless the authorization is terminated or revoked.  Performed at [facility], [address], [place], [place] [address]     Radiology Reports DG Chest Portable 1 View  Result Date: 02/12/2022 CLINICAL DATA:  Short of breath EXAM: PORTABLE CHEST 1 VIEW COMPARISON:  Prior chest x-ray 12/25/2021 FINDINGS: The lungs are clear and negative for focal airspace consolidation, pulmonary edema or suspicious pulmonary nodule. No pleural effusion or pneumothorax. Changed sutures are present in the medial right lung apex. No evidence of pneumothorax or other complication. Cardiac and mediastinal contours are within normal limits. No acute fracture or lytic or blastic osseous lesions. The visualized upper abdominal bowel gas pattern is unremarkable. IMPRESSION: No active disease. Electronically Signed   By: Jacqulynn Cadet M.D.   On: 02/12/2022 06:39    SIGNED: Deatra James, MD, FHM. [facility],  Pager (please use [email or web address] to page/text) Please use Epic Secure Chat for non-urgent communication (7AM-7PM)  If 7PM-7AM, please contact night-coverage [email or web address], 02/12/2022, 11:09 AM

## 2022-02-12 NOTE — Assessment & Plan Note (Signed)
-  Dehydration, diabetic ketoacidosis -Continue IV fluids, monitoring kidney function closely Lab Results  Component Value Date   CREATININE 2.57 (H) 02/12/2022   CREATININE 1.01 12/26/2021   CREATININE 1.13 12/26/2021

## 2022-02-12 NOTE — Assessment & Plan Note (Signed)
-   Tolerating resuming home medication of Neurontin

## 2022-02-12 NOTE — ED Notes (Signed)
Pt placed on bear hugger.  

## 2022-02-12 NOTE — ED Notes (Signed)
Pts HR in the 140s, MD made aware

## 2022-02-12 NOTE — Assessment & Plan Note (Signed)
-   Urine drug screen positive for marijuana -Patient has been counseled regarding abstaining from street drugs including marijuana use

## 2022-02-12 NOTE — Progress Notes (Signed)
Per provider, no ABT at this time. Patient is resting, denied chest pain. CBG is 405 now. BMET is being collected by lab at this time.

## 2022-02-12 NOTE — Assessment & Plan Note (Addendum)
-   Resolved CBG (last 3)  Recent Labs    02/13/22 0411 02/13/22 0602 02/13/22 0744  GLUCAP 173* 145* 119*    -Hemodynamically stable now  -S/P Severe diabetic acidosis ABG    Component Value Date/Time   PHART 7.22 (L) 02/12/2022 1036   PCO2ART <18 (LL) 02/12/2022 1036   PO2ART 67 (L) 02/12/2022 1036   HCO3 6.0 (L) 02/12/2022 1036   TCO2 11.3 04/30/2014 1215   ACIDBASEDEF 19.4 (H) 02/12/2022 1036   O2SAT 95.5 02/12/2022 1036   -Much improved tachycardia, tachypnea  -Currently conscious -Improved diabetic ketoacidotic state,  -Initiating long-acting insulin, resuming home regimen of NPH -Off insulin drip, IV fluids, bicarb drip -Checking CBG ACHS, SSI coverage  -Monitoring closely as patient also has a hypokalemia

## 2022-02-12 NOTE — Hospital Course (Signed)
Pedro [name] is a 26 year old male with extensive history of DM1, HTN, ADHD, gastroparesis, neuropathy, history of polysubstance abuse noncompliant with severe hyperglycemia, and diabetic ketoacidotic state again. Patient presented with altered mental status, mild shortness of breath.  Started on IV fluid, insulin drip, has regained consciousness, Per ED report patient was dropped off to ED by significant other who promptly left the premises.   Multiple hospitalization for same complaint, DKA!!!!!  Compliant!  ED course:  Vitals:   02/12/22 0730 02/12/22 0800  BP: (!) 151/105 (!) 151/92  Pulse: (!) 131 (!) 134  Resp: (!) 30 (!) 30  Temp:    SpO2: 99% 97%    ABG    Component Value Date/Time   PHART 7.336 (L) 04/30/2014 1215   PCO2ART 25.0 (L) 04/30/2014 1215   PO2ART 93.4 04/30/2014 1215   HCO3 4.2 (L) 02/12/2022 0628   TCO2 11.3 04/30/2014 1215   ACIDBASEDEF 27.0 (H) 02/12/2022 0628   O2SAT 87.7 02/12/2022 0628   CBG (last 3) > 1200 Recent Labs    02/12/22 0552 02/12/22 0701 02/12/22 0738  GLUCAP >600* >600* >600*    WBC 38.3, sodium 120, potassium 6.4, chloride 78, bicarb<7, glucose>1200, BUN 30, creatinine 2.57, anion gap not calculated GFR 34  UA Clear -Urine drug screen positive for marijuana -X-ray clear

## 2022-02-12 NOTE — Assessment & Plan Note (Signed)
-   Patient meeting sepsis criteria at this point due to severe DKA -No signs of infection-UA and chest x-ray clear -Will continue to monitor, not initiating an antibiotic at this point

## 2022-02-12 NOTE — Assessment & Plan Note (Addendum)
-   Uncontrolled -Noncompliant -Last A1c 6/15 >>>>  9.7 -Resuming home medication of NovoLog 70/30 at 20 units twice daily

## 2022-02-12 NOTE — ED Provider Notes (Signed)
[facility] EMERGENCY DEPARTMENT Provider Note   CSN: [id number] Arrival date & time: 02/12/22  0543     History  Chief Complaint  Patient presents with   Altered Mental Status   Shortness of Breath   Level 5 caveat due to acuity of condition Pedro [name] is a 26 y.o. male.   Altered Mental Status Shortness of Breath Patient with known history of diabetes type 1, substance use disorder presents with altered mental status and shortness of breath.  Minimal history is known on arrival.  Patient was dropped off by significant other who promptly left the premises.  Patient was reporting short of breath and is unclear why he feels altered.  Patient appears dehydrated and smells of ketones.    Past Medical History:  Diagnosis Date   Acute kidney injury ([place]) 08/19/2021   ADHD (attention deficit hyperactivity disorder)    AKI (acute kidney injury) ([place]) 10/17/2014   Diabetic ketoacidosis ([place]) 10/05/2013   DKA, type 1 ([place]) 12/15/2013   DM type 1 (diabetes mellitus, type 1) ([place])    diagnosed age 44   Gastroenteritis 02/16/2014   Genital herpes simplex type 2 12/15/2013   Herpes    High cholesterol    Hyperkalemia 10/15/2012   Hypertension    Noncompliance 10/15/2012   Noncompliance with medications    Polysubstance dependence ([place]) 07/22/2011   SIRS (systemic inflammatory response syndrome) ([place]) 12/25/2021   Substance induced mood disorder ([place]) 07/22/2011   Thrombocytosis 08/19/2021   Tobacco abuse 12/15/2013    Home Medications Prior to Admission medications   Medication Sig Start Date End Date Taking? Authorizing Provider  blood glucose meter kit and supplies Dispense based on patient and insurance preference. Use up to four times daily as directed. (FOR ICD-10 E10.9, E11.9). 07/01/21   Johnson, Clanford L, MD  gabapentin (NEURONTIN) 300 MG capsule Take 1 capsule (300 mg total) by mouth at bedtime. 07/01/21   Johnson, Clanford L, MD  insulin NPH-regular Human  (70-30) 100 UNIT/ML injection Inject 10 Units into the skin 2 (two) times daily with a meal. 12/02/21   Manuella Ghazi, Pratik D, DO  lamoTRIgine (LAMICTAL) 25 MG tablet Take 25 mg by mouth 2 (two) times daily. 11/15/21   [provider]  lisinopril (ZESTRIL) 10 MG tablet Take 10 mg by mouth daily. 11/15/21   [provider]      Allergies    Sulfa antibiotics    Review of Systems   Review of Systems  Unable to perform ROS: Acuity of condition  Respiratory:  Positive for shortness of breath.     Physical Exam Updated Vital Signs BP (!) 151/105   Pulse (!) 131   Temp (!) 90.6 F (32.6 C) (Rectal)   Resp (!) 30   SpO2 99%  Physical Exam CONSTITUTIONAL: Disheveled, ill-appearing, appears older than stated age, smells of ketones HEAD: Normocephalic/atraumatic, no visible trauma EYES: EOMI/PERRL ENMT: Mucous membranes dry, poor dentition NECK: supple no meningeal signs SPINE/BACK:entire spine nontender CV: S1/S2 noted, tachycardic LUNGS: Lungs are clear to auscultation bilaterally, tachypneic ABDOMEN: soft, nontender, no rebound or guarding NEURO: Pt is somnolent but arousable, follows commands, moves all extremities x 4 EXTREMITIES: pulses normal/equal, full ROM SKIN: warm, color normal PSYCH: Unable to assess  ED Results / Procedures / Treatments   Labs (all labs ordered are listed, but only abnormal results are displayed) Labs Reviewed  BASIC METABOLIC PANEL - Abnormal; Notable for the following components:      Result Value   Sodium  120 (*)    Potassium 6.4 (*)    Chloride 78 (*)    CO2 <7 (*)    Glucose, Bld >1,200 (*)    BUN 57 (*)    Creatinine, Ser 2.57 (*)    Calcium 8.5 (*)    GFR, Estimated 34 (*)    All other components within normal limits  CBC WITH DIFFERENTIAL/PLATELET - Abnormal; Notable for the following components:   WBC 38.3 (*)    MCV 100.2 (*)    MCHC 29.6 (*)    Platelets 403 (*)    Neutro Abs 28.4 (*)    Lymphs Abs 6.7 (*)     Monocytes Absolute 1.9 (*)    Abs Immature Granulocytes 1.13 (*)    All other components within normal limits  URINALYSIS, ROUTINE W REFLEX MICROSCOPIC - Abnormal; Notable for the following components:   Color, Urine STRAW (*)    Glucose, UA >=500 (*)    Hgb urine dipstick MODERATE (*)    Ketones, ur 80 (*)    Protein, ur 30 (*)    Bacteria, UA RARE (*)    All other components within normal limits  BLOOD GAS, VENOUS - Abnormal; Notable for the following components:   pH, Ven 6.98 (*)    pCO2, Ven <18 (*)    pO2, Ven 47 (*)    Bicarbonate 4.2 (*)    Acid-base deficit 27.0 (*)    All other components within normal limits  RAPID URINE DRUG SCREEN, HOSP PERFORMED - Abnormal; Notable for the following components:   Tetrahydrocannabinol POSITIVE (*)    All other components within normal limits  CBG MONITORING, ED - Abnormal; Notable for the following components:   Glucose-Capillary >600 (*)    All other components within normal limits  CBG MONITORING, ED - Abnormal; Notable for the following components:   Glucose-Capillary >600 (*)    All other components within normal limits  CBG MONITORING, ED - Abnormal; Notable for the following components:   Glucose-Capillary >600 (*)    All other components within normal limits  RESP PANEL BY RT-PCR (FLU A&B, COVID) ARPGX2  ETHANOL  BASIC METABOLIC PANEL  BASIC METABOLIC PANEL  BASIC METABOLIC PANEL  BASIC METABOLIC PANEL  BETA-HYDROXYBUTYRIC ACID  BETA-HYDROXYBUTYRIC ACID  BETA-HYDROXYBUTYRIC ACID  CBC  MAGNESIUM  PHOSPHORUS  PROTIME-INR    EKG EKG Interpretation  Date/Time:  Saturday February 12 2022 05:57:16 EST Ventricular Rate:  113 PR Interval:  154 QRS Duration: 164 QT Interval:  479 QTC Calculation: 657 R Axis:   97 Text Interpretation: Sinus tachycardia Left atrial enlargement Artifact in lead(s) V6 Interpretation limited secondary to artifact Abnormal ekg Confirmed by Ripley Fraise [phone]) on 02/12/2022 6:09:43  AM  Radiology DG Chest Portable 1 View  Result Date: 02/12/2022 CLINICAL DATA:  Short of breath EXAM: PORTABLE CHEST 1 VIEW COMPARISON:  Prior chest x-ray 12/25/2021 FINDINGS: The lungs are clear and negative for focal airspace consolidation, pulmonary edema or suspicious pulmonary nodule. No pleural effusion or pneumothorax. Changed sutures are present in the medial right lung apex. No evidence of pneumothorax or other complication. Cardiac and mediastinal contours are within normal limits. No acute fracture or lytic or blastic osseous lesions. The visualized upper abdominal bowel gas pattern is unremarkable. IMPRESSION: No active disease. Electronically Signed   By: Jacqulynn Cadet M.D.   On: 02/12/2022 06:39    Procedures .Critical Care  Performed by: Ripley Fraise, MD Authorized by: Ripley Fraise, MD   Critical care provider statement:  Critical care time (minutes):  75   Critical care start time:  02/12/2022 6:00 AM   Critical care end time:  02/12/2022 7:15 AM   Critical care time was exclusive of:  Separately billable procedures and treating other patients   Critical care was necessary to treat or prevent imminent or life-threatening deterioration of the following conditions:  Metabolic crisis, endocrine crisis, dehydration and renal failure   Critical care was time spent personally by me on the following activities:  Examination of patient, evaluation of patient's response to treatment, development of treatment plan with patient or surrogate, ordering and performing treatments and interventions, ordering and review of laboratory studies, ordering and review of radiographic studies, pulse oximetry and re-evaluation of patient's condition   I assumed direction of critical care for this patient from another provider in my specialty: no     Care discussed with: admitting provider       Medications Ordered in ED Medications  insulin regular, human (MYXREDLIN) 100 units/ 100 mL  infusion (6 Units/hr Intravenous New Bag/Given 02/12/22 0624)  lactated ringers infusion ( Intravenous New Bag/Given 02/12/22 0638)  dextrose 5 % in lactated ringers infusion (has no administration in time range)  dextrose 50 % solution 0-50 mL (has no administration in time range)  heparin injection 5,000 Units (has no administration in time range)  sodium chloride flush (NS) 0.9 % injection 3 mL (has no administration in time range)  0.9 %  sodium chloride infusion (has no administration in time range)  sodium chloride flush (NS) 0.9 % injection 3 mL (has no administration in time range)  sodium chloride flush (NS) 0.9 % injection 3 mL (has no administration in time range)  0.9 %  sodium chloride infusion (has no administration in time range)  acetaminophen (TYLENOL) tablet 650 mg (has no administration in time range)    Or  acetaminophen (TYLENOL) suppository 650 mg (has no administration in time range)  oxyCODONE (Oxy IR/ROXICODONE) immediate release tablet 5 mg (has no administration in time range)  HYDROmorphone (DILAUDID) injection 0.5-1 mg (has no administration in time range)  zolpidem (AMBIEN) tablet 5 mg (has no administration in time range)  senna-docusate (Senokot-S) tablet 1 tablet (has no administration in time range)  bisacodyl (DULCOLAX) EC tablet 5 mg (has no administration in time range)  sodium phosphate (FLEET) 7-19 GM/118ML enema 1 enema (has no administration in time range)  ondansetron (ZOFRAN) tablet 4 mg (has no administration in time range)    Or  ondansetron (ZOFRAN) injection 4 mg (has no administration in time range)  ipratropium (ATROVENT) nebulizer solution 0.5 mg (has no administration in time range)  levalbuterol (XOPENEX) nebulizer solution 0.63 mg (has no administration in time range)  hydrALAZINE (APRESOLINE) injection 10 mg (has no administration in time range)  sodium bicarbonate 150 mEq in dextrose 5 % 1,150 mL infusion (has no administration in time  range)  lactated ringers bolus 1,160 mL (0 mLs Intravenous Stopped 02/12/22 0745)  sodium chloride 0.9 % bolus 1,000 mL (1,000 mLs Intravenous New Bag/Given 02/12/22 0717)    ED Course/ Medical Decision Making/ A&P Clinical Course as of 02/12/22 0748  Sat Feb 12, 2022  0611 Patient is in obvious DKA.  Glucose over 700, anion gap over 20 with acute kidney injury.  IV fluids have been ordered.  IV insulin has been ordered [DW]  5462 Patient now more alert.  He is unsure why he is back in DKA.  He does not use an insulin pump at  baseline [DW]  0711 WBC(!): 38.3 Significant leukoctyosis [DW]  6067 Patient with significant acidosis, receiving IV fluids as well as IV insulin. [DW]  7034 Sodium(!): 120 Hyponatremia [DW]  0729 Glucose(!!): >1,200 Hyperglycemia [DW]  0729 Creatinine(!): 2.57 Acute kidney injury [DW]  0352 Consult dr Richarda Blade for admission.  He requests bicarb drip [DW]    Clinical Course User Index [DW] Ripley Fraise, MD                           Medical Decision Making Amount and/or Complexity of Data Reviewed Labs: ordered. Decision-making details documented in ED Course. Radiology: ordered. ECG/medicine tests: ordered.  Risk Prescription drug management. Decision regarding hospitalization.   This patient presents to the ED for concern of altered mental status, this involves an extensive number of treatment options, and is a complaint that carries with it a high risk of complications and morbidity.  The differential diagnosis includes but is not limited to CVA, intracranial hemorrhage, acute coronary syndrome, renal failure, urinary tract infection, electrolyte disturbance, pneumonia, diabetic ketoacidosis    Comorbidities that complicate the patient evaluation: Patient's presentation is complicated by their history of diabetes  Social Determinants of Health: Patient's  substance use disorder   increases the complexity of managing their presentation  Additional  history obtained: Records reviewed previous admission documents  Lab Tests: I Ordered, and personally interpreted labs.  The pertinent results include: Diabetic ketoacidosis, acute kidney injury, hyperkalemia  Imaging Studies ordered: I ordered imaging studies including X-ray chest x-ray   I independently visualized and interpreted imaging which showed no acute findings I agree with the radiologist interpretation  Cardiac Monitoring: The patient was maintained on a cardiac monitor.  I personally viewed and interpreted the cardiac monitor which showed an underlying rhythm of:  sinus tachycardia  Medicines ordered and prescription drug management: I ordered medication including IV fluids and insulin for DKA Reevaluation of the patient after these medicines showed that the patient    stayed the same  Critical Interventions:   fluids and IV insulin  Consultations Obtained: I requested consultation with the admitting physician triad , and discussed  findings as well as pertinent plan - they recommend: will admit  Reevaluation: After the interventions noted above, I reevaluated the patient and found that they have :stayed the same  Complexity of problems addressed: Patient's presentation is most consistent with  acute presentation with potential threat to life or bodily function  Disposition: After consideration of the diagnostic results and the patient's response to treatment,  I feel that the patent would benefit from admission   .           Final Clinical Impression(s) / ED Diagnoses Final diagnoses:  Diabetic ketoacidosis without coma associated with type 1 diabetes mellitus ([place])  Dehydration  AKI (acute kidney injury) ([place])  Hypothermia, initial encounter    Rx / DC Orders ED Discharge Orders     None         Ripley Fraise, MD 02/12/22 [phone]

## 2022-02-12 NOTE — Progress Notes (Signed)
Carry over:

## 2022-02-12 NOTE — Progress Notes (Signed)
Patient arrived to unit in respiratory distress and yelling out c/o chest pain 10/10 on 2L [place]. Patient stats were in the 37s. HR 160s. Emergency chest pain standing orders implemented. 3 rounds of nitro administered with minimal relief. Patient required non-rebreather to support sats. Provider in and ordered additional labs and morphine. Patient appears to be responding to interventions. Provider is at bedside at this time

## 2022-02-12 NOTE — Progress Notes (Signed)
Elink following sepsis bundle. °

## 2022-02-12 NOTE — Progress Notes (Signed)
Called lab at about 3pm to inquire about why the BMP results had not populated. Spoke to [place] who stated there was not a BMP lab to be drawn during the 1pm hour in the lab que. Pedro [name] came to ICU to collect BMP now

## 2022-02-12 NOTE — Inpatient Diabetes Management (Signed)
Inpatient Diabetes Program Recommendations  AACE/ADA: New Consensus Statement on Inpatient Glycemic Control   Target Ranges:  Prepandial:   less than 140 mg/dL      Peak postprandial:   less than 180 mg/dL (1-2 hours)      Critically ill patients:  140 - 180 mg/dL    Latest Reference Range & Units 02/12/22 05:52 02/12/22 07:01 02/12/22 07:38 02/12/22 08:31  Glucose-Capillary 70 - 99 mg/dL >600 (HH) >600 (HH) >600 (HH) >600 (HH)    Latest Reference Range & Units 02/12/22 05:56  CO2 22 - 32 mmol/L <7 (L)  Glucose 70 - 99 mg/dL >1,200 (HH)  Anion gap 5 - 15  NOT CALCULATED   Review of Glycemic Control  Diabetes history: DM1; does not make any insulin; requires basal, correction, and carb coverage insulin Outpatient Diabetes medications: 70/30/ 10 units BID Current orders for Inpatient glycemic control: IV insulin, 70/30 20 units BID  Inpatient Diabetes Program Recommendations:    Insulin: Please discontinue 70/30 order. IV insulin just ordered and not started yet. IV insulin will need to be continued until acidosis has completely cleared. Once acidosis has cleared and provider is ready to transition to SQ insulin, please consider ordering [place] 9 units Q24H, CBGs Q4H, Novolog 0-6 units Q4H.  NOTE: Noted consult for Diabetes Coordinator. Diabetes Coordinator is not on campus over the weekend but available by pager from 8am to 5pm for questions or concerns. Chart reviewed. Patient is well known to inpatient diabetes team due to multiple admissions with DKA (last admission 12/25/21-12/26/21).  Thanks, Barnie Alderman, RN, MSN, [place] Diabetes Coordinator Inpatient Diabetes Program [phone] (Team Pager from 8am to [place])

## 2022-02-12 NOTE — Assessment & Plan Note (Addendum)
-   Urine drug screen only positive for marijuana at this point -History of cocaine marijuana and tobacco use

## 2022-02-12 NOTE — ED Triage Notes (Signed)
Pt arrived via POV w obvious ams, sob, and notable keytone smell. Pt stated taht he is a type 1 diabetic.

## 2022-02-12 NOTE — Assessment & Plan Note (Signed)
-   Sodium level 120 -Due to diabetic ketoacidotic state -Continue with aggressive IV fluid resuscitation, monitoring sodium level very closely

## 2022-02-12 NOTE — Assessment & Plan Note (Signed)
-   Discussed with patient regarding compliance with home regimen insulin regimen -Diabetic diet -And the consequences of noncompliance with repeated episodes of DKA and possible death

## 2022-02-12 NOTE — ED Notes (Addendum)
Pt c/o SOB and states "I want an x-ray first before going up to ICU" pt refuses to go up until he gets an xray. Explained to pt that delaying transport to ICU is delaying his care. Pt agreed to be transported to ICU.--MD made aware

## 2022-02-12 NOTE — Progress Notes (Signed)
Patient's core temp is 96.5 he is on the warming blanket now. With WBC 38.3, elevated HR,elevated RR, hypothermia, and abnormal UA. Provider made aware for possible AB orders. Awaiting response.

## 2022-02-12 NOTE — Assessment & Plan Note (Addendum)
-  Treating DKA under DKA protocol in ICU setting -IV fluid resuscitation, insulin drip, bicarb drip

## 2022-02-12 NOTE — ED Notes (Signed)
Pharmacy to mix sodium bicarbonate

## 2022-02-12 NOTE — Assessment & Plan Note (Signed)
-  Serum potassium 6.4 -Currently on insulin drip, checking BMP every 4-6 hours, monitoring closely -No EKG changes -Anticipating improvement in potassium level with insulin drip, IV fluids

## 2022-02-12 NOTE — Progress Notes (Signed)
Date and time results received: 02/12/22 n (use smartphrase ".now" to insert current time)  Test: Trop Critical Value: 133  Name of Provider Notified: Shehmehdi   Orders Received? Or Actions Taken?: provider at bedside

## 2022-02-12 NOTE — H&P (Signed)
History and Physical   Patient: Pedro [name]                            PCP: Neale Burly, MD                    DOB: Mar 09, 1995            DOA: 02/12/2022 EZM:[id number]             DOS: 02/12/2022, 11:08 AM  Neale Burly, MD  Patient coming from:   HOME  I have personally reviewed patient's medical records, in electronic medical records, including:  [facility], and care everywhere.    Chief Complaint:   Chief Complaint  Patient presents with   Altered Mental Status   Shortness of Breath    History of present illness:    Pedro [name] is a 26 year old male with extensive history of DM1, HTN, ADHD, gastroparesis, neuropathy, history of polysubstance abuse noncompliant with severe hyperglycemia, and diabetic ketoacidotic state again. Patient presented with altered mental status, mild shortness of breath.  Started on IV fluid, insulin drip, has regained consciousness, Per ED report patient was dropped off to ED by significant other who promptly left the premises.   Multiple hospitalization for same complaint, DKA!!!!!  Compliant!  ED course:  Vitals:   02/12/22 0730 02/12/22 0800  BP: (!) 151/105 (!) 151/92  Pulse: (!) 131 (!) 134  Resp: (!) 30 (!) 30  Temp:    SpO2: 99% 97%    ABG    Component Value Date/Time   PHART 7.336 (L) 04/30/2014 1215   PCO2ART 25.0 (L) 04/30/2014 1215   PO2ART 93.4 04/30/2014 1215   HCO3 4.2 (L) 02/12/2022 0628   TCO2 11.3 04/30/2014 1215   ACIDBASEDEF 27.0 (H) 02/12/2022 0628   O2SAT 87.7 02/12/2022 0628   CBG (last 3) > 1200 Recent Labs    02/12/22 0552 02/12/22 0701 02/12/22 0738  GLUCAP >600* >600* >600*    WBC 38.3, sodium 120, potassium 6.4, chloride 78, bicarb<7, glucose>1200, BUN 30, creatinine 2.57, anion gap not calculated GFR 34  UA Clear -Urine drug screen positive for marijuana -X-ray clear    Patient Denies having: Fever, Chills, Cough, SOB, Chest Pain, Abd pain, N/V/D, headache, dizziness,  lightheadedness,  Dysuria, Joint pain, rash, open wounds  ED Course:   Blood pressure (!) 151/92, pulse (!) 134, temperature (!) 90.6 F (32.6 C), temperature source Rectal, resp. rate (!) 30, SpO2 97 %. Abnormal labs;   Review of Systems: As per HPI, otherwise 10 point review of systems were negative.   ----------------------------------------------------------------------------------------------------------------------  Allergies  Allergen Reactions   Sulfa Antibiotics Hives and Other (See Comments)    Reaction:  Fever     Home MEDs:  Prior to Admission medications   Medication Sig Start Date End Date Taking? Authorizing Provider  blood glucose meter kit and supplies Dispense based on patient and insurance preference. Use up to four times daily as directed. (FOR ICD-10 E10.9, E11.9). 07/01/21   Johnson, Clanford L, MD  gabapentin (NEURONTIN) 300 MG capsule Take 1 capsule (300 mg total) by mouth at bedtime. 07/01/21   Johnson, Clanford L, MD  insulin NPH-regular Human (70-30) 100 UNIT/ML injection Inject 10 Units into the skin 2 (two) times daily with a meal. 12/02/21   Manuella Ghazi, Pratik D, DO  lamoTRIgine (LAMICTAL) 25 MG tablet Take 25 mg by mouth 2 (two) times daily. 11/15/21  [provider]  lisinopril (ZESTRIL) 10 MG tablet Take 10 mg by mouth daily. 11/15/21   [provider]    PRN MEDs: sodium chloride, acetaminophen **OR** acetaminophen, bisacodyl, dextrose, hydrALAZINE, HYDROmorphone (DILAUDID) injection, ipratropium, levalbuterol, morphine injection, ondansetron **OR** ondansetron (ZOFRAN) IV, oxyCODONE, senna-docusate, sodium chloride flush, sodium phosphate, zolpidem  Past Medical History:  Diagnosis Date   Acute kidney injury ([place]) 08/19/2021   ADHD (attention deficit hyperactivity disorder)    AKI (acute kidney injury) ([place]) 10/17/2014   Diabetic ketoacidosis ([place]) 10/05/2013   DKA, type 1 ([place]) 12/15/2013   DM type 1 (diabetes mellitus, type 1) ([place])     diagnosed age 25   Gastroenteritis 02/16/2014   Genital herpes simplex type 2 12/15/2013   Herpes    High cholesterol    Hyperkalemia 10/15/2012   Hypertension    Noncompliance 10/15/2012   Noncompliance with medications    Polysubstance dependence ([place]) 07/22/2011   SIRS (systemic inflammatory response syndrome) ([place]) 12/25/2021   Substance induced mood disorder ([place]) 07/22/2011   Thrombocytosis 08/19/2021   Tobacco abuse 12/15/2013    Past Surgical History:  Procedure Laterality Date   INTERCOSTAL NERVE BLOCK Right 12/10/2020   Procedure: INTERCOSTAL NERVE BLOCK;  Surgeon: Lajuana Matte, MD;  Location: [facility];  Service: Thoracic;  Laterality: Right;   none     PLEURADESIS Right 12/10/2020   Procedure: MECHANICAL PLEURADESIS;  Surgeon: Lajuana Matte, MD;  Location: [facility];  Service: Thoracic;  Laterality: Right;   PLEURECTOMY Right 12/10/2020   Procedure: APICAL PLEURECTOMY;  Surgeon: Lajuana Matte, MD;  Location: [facility];  Service: Thoracic;  Laterality: Right;     reports that he has been smoking cigarettes. He has a 5.00 pack-year smoking history. He has never used smokeless tobacco. He reports that he does not currently use drugs after having used the following drugs: Marijuana, Other-see comments, and Benzodiazepines. He reports that he does not drink alcohol.   Family History  Problem Relation Age of Onset   Diabetes Mellitus I Paternal Grandmother    Diabetes Mellitus I Paternal Aunt    Anxiety disorder Mother    Hypertension Mother    Hypertension Father     Physical Exam:   Vitals:   02/12/22 1103 02/12/22 1104 02/12/22 1105 02/12/22 1107  BP: (!) 92/58     Pulse: (!) 114 (!) 115 (!) 115 (!) 117  Resp: (!) 38 (!) 35 (!) 35 (!) 32  Temp:      TempSrc:      SpO2: 95% 94% 96% 96%  Weight:       Constitutional: Agitated, tachypneic, tachycardic, confused Eyes: PERRL, lids and conjunctivae normal ENMT: Mucous membranes are moist. Posterior  pharynx clear of any exudate or lesions.Normal dentition.  Neck: normal, supple, no masses, no thyromegaly Respiratory: clear to auscultation bilaterally, no wheezing, no crackles. Normal respiratory effort. No accessory muscle use.  Cardiovascular: Regular rate and rhythm, no murmurs / rubs / gallops. No extremity edema. 2+ pedal pulses. No carotid bruits.  Abdomen: no tenderness, no masses palpated. No hepatosplenomegaly. Bowel sounds positive.  Musculoskeletal: no clubbing / cyanosis. No joint deformity upper and lower extremities. Good ROM, no contractures. Normal muscle tone.  Neurologic: Limited exam, confused CN II-XII grossly intact. Sensation intact, DTR normal. Strength 5/5 in all 4.  Skin: no rashes, lesions, ulcers. No induration Wounds: per nursing documentation         Labs on admission:    I have personally reviewed following labs and  imaging studies  CBC: Recent Labs  Lab 02/12/22 0556  WBC 38.3*  NEUTROABS 28.4*  HGB 14.4  HCT 48.6  MCV 100.2*  PLT 443*   Basic Metabolic Panel: Recent Labs  Lab 02/12/22 0556 02/12/22 0750 02/12/22 0934  NA 120*  --  129*  K 6.4*  --  5.7*  CL 78*  --  93*  CO2 <7*  --  8*  GLUCOSE >1,200*  --  813*  BUN 57*  --  50*  CREATININE 2.57*  --  2.28*  CALCIUM 8.5*  --  7.9*  MG  --  2.8*  --   PHOS  --  11.2*  --     Coagulation Profile: Recent Labs  Lab 02/12/22 0934  INR 1.2    CBG: Recent Labs  Lab 02/12/22 0738 02/12/22 0831 02/12/22 0908 02/12/22 0938 02/12/22 1024  GLUCAP >600* >600* >600* >600* >600*    Urine analysis:    Component Value Date/Time   COLORURINE STRAW (A) 02/12/2022 0556   APPEARANCEUR CLEAR 02/12/2022 0556   LABSPEC 1.018 02/12/2022 0556   PHURINE 5.0 02/12/2022 0556   GLUCOSEU >=500 (A) 02/12/2022 0556   HGBUR MODERATE (A) 02/12/2022 0556   BILIRUBINUR NEGATIVE 02/12/2022 0556   BILIRUBINUR neg 04/16/2014 1752   KETONESUR 80 (A) 02/12/2022 0556   PROTEINUR 30 (A)  02/12/2022 0556   UROBILINOGEN 0.2 10/17/2014 0743   NITRITE NEGATIVE 02/12/2022 0556   LEUKOCYTESUR NEGATIVE 02/12/2022 0556    Last A1C:  Lab Results  Component Value Date   HGBA1C 9.7 (H) 08/19/2021     Radiologic Exams on Admission:   DG Chest Portable 1 View  Result Date: 02/12/2022 CLINICAL DATA:  Short of breath EXAM: PORTABLE CHEST 1 VIEW COMPARISON:  Prior chest x-ray 12/25/2021 FINDINGS: The lungs are clear and negative for focal airspace consolidation, pulmonary edema or suspicious pulmonary nodule. No pleural effusion or pneumothorax. Changed sutures are present in the medial right lung apex. No evidence of pneumothorax or other complication. Cardiac and mediastinal contours are within normal limits. No acute fracture or lytic or blastic osseous lesions. The visualized upper abdominal bowel gas pattern is unremarkable. IMPRESSION: No active disease. Electronically Signed   By: Jacqulynn Cadet M.D.   On: 02/12/2022 06:39    EKG:   Independently reviewed.  Orders placed or performed during the hospital encounter of 02/12/22   EKG 12-Lead   EKG 12-Lead   EKG 12-Lead   EKG 12-Lead   EKG 12-Lead   EKG 12-Lead   EKG 12-Lead   EKG - 12 lead   EKG - 12 lead   ---------------------------------------------------------------------------------------------------------------------------------------    Assessment / Plan:   Principal Problem:   DKA (diabetic ketoacidosis) ([place]) Active Problems:   Polysubstance dependence ([place])   Acute kidney injury ([place])   Sepsis ([place])   Diabetic ketoacidosis without coma associated with type 1 diabetes mellitus ([place])   Diabetic neuropathy ([facility])   Hyperkalemia   Noncompliance   Polysubstance abuse ([place])   DM type 1 (diabetes mellitus, type 1) ([place])   Hyponatremia   Assessment and Plan: * DKA (diabetic ketoacidosis) ([place]) -Patient will be admitted to ICU -Severe diabetic acidosis ABG    Component Value Date/Time   PHART 7.22  (L) 02/12/2022 1036   PCO2ART <18 (LL) 02/12/2022 1036   PO2ART 67 (L) 02/12/2022 1036   HCO3 6.0 (L) 02/12/2022 1036   TCO2 11.3 04/30/2014 1215   ACIDBASEDEF 19.4 (H) 02/12/2022 1036   O2SAT 95.5 02/12/2022 1036   -  Tachypneic, tachycardic,  -N.p.o. -Currently conscious -Diabetic ketoacidotic pathway has been activated through ED -Will continue with IV fluid resuscitation, insulin drip, bicarb drip  -CBG every hour for now,  -Checking BMP Q 4-6 hours-checking CBG every 1 hour -Severe electrolyte abnormalities, with significant anion gap, Monitoring closely, anticipating improvement with aggressive IV fluid resuscitation, insulin drip  -Monitoring closely as patient also has a hyperkalemia   Sepsis ([place]) - Patient meeting sepsis criteria at this point due to severe DKA -No signs of infection-UA and chest x-ray clear -Will continue to monitor, not initiating an antibiotic at this point  Acute kidney injury ([place]) -Dehydration, diabetic ketoacidosis -Continue IV fluids, monitoring kidney function closely Lab Results  Component Value Date   CREATININE 2.57 (H) 02/12/2022   CREATININE 1.01 12/26/2021   CREATININE 1.13 12/26/2021     Polysubstance dependence ([place]) - Urine drug screen only positive for marijuana at this point -History of cocaine marijuana and tobacco use  Diabetic ketoacidosis without coma associated with type 1 diabetes mellitus ([place]) -Treating DKA under DKA protocol in ICU setting -IV fluid resuscitation, insulin drip, bicarb drip  Diabetic neuropathy ([place]) - Tolerating resuming home medication of Neurontin  Hyponatremia - Sodium level 120 -Due to diabetic ketoacidotic state -Continue with aggressive IV fluid resuscitation, monitoring sodium level very closely  DM type 1 (diabetes mellitus, type 1) ([facility]) - Uncontrolled -Noncompliant -Last A1c 6/15 >>>>  9.7 -Resuming home medication of NovoLog 70/30 at 20 units twice daily   Polysubstance  abuse ([place]) - Urine drug screen positive for marijuana -Patient has been counseled regarding abstaining from street drugs including marijuana use  Noncompliance - Discussed with patient regarding compliance with home regimen insulin regimen -Diabetic diet -And the consequences of noncompliance with repeated episodes of DKA and possible death  Hyperkalemia -Serum potassium 6.4 -Currently on insulin drip, checking BMP every 4-6 hours, monitoring closely -No EKG changes -Anticipating improvement in potassium level with insulin drip, IV fluids              Consults called:  None -------------------------------------------------------------------------------------------------------------------------------------------- DVT prophylaxis:  heparin injection 5,000 Units Start: 02/12/22 1400 SCDs Start: 02/12/22 0750 TED hose Start: 02/12/22 0744 SCDs Start: 02/12/22 0744   Code Status:   Code Status: Full Code   Admission status: Patient will be admitted as Inpatient, with a greater than 2 midnight length of stay. Level of care: ICU   Family Communication:  none at bedside  (The above findings and plan of care has been discussed with patient in detail, the patient expressed understanding and agreement of above plan)  --------------------------------------------------------------------------------------------------------------------------------------------------  Disposition Plan:  Anticipated 1-2 days Status is: Inpatient Remains inpatient appropriate because: Needing ICU criteria for severe diabetic ketoacidosis, needing aggressive IV fluid, IV bicarb, IV insulin,     ----------------------------------------------------------------------------------------------------------------------------------------------------  Time spent: > than  70  Min.   SIGNED: Deatra James, MD, FHM. [facility],  Pager (Please use [email or web address] to page to text) > 75 minutes of  critical care time was spent in admitting this patient , to ICU initiating DKA protocol, reviewing medical records, discussing care with the patient, and ICU staff.  If 7PM-7AM, please contact night-coverage [email or web address],  02/12/2022, 11:08 AM

## 2022-02-12 NOTE — Progress Notes (Signed)
Date and time results received: 02/12/22 now (use smartphrase ".now" to insert current time)  Test: Lactic Acid Critical Value: 4.3  Name of Provider Notified: Dr. Laymond Purser  Orders Received? Or Actions Taken?: Orders Received - See Orders for details

## 2022-02-12 NOTE — Progress Notes (Addendum)
Pharmacy Antibiotic Note  Pedro [name] is a 26 y.o. male admitted on 02/12/2022 with sepsis.  Pharmacy has been consulted for Cefepime and vancomycin dosing.  Plan: Vancomycin '1250mg'$  IV loading dose, then 1000 mg IV Q 24 hrs. Goal AUC 400-550. Expected AUC: 478 SCr used: 2.2 Cefepime 2gm IV q12h Also, on flagyl '500mg'$  IV q12h F/U cxs and clinical progress Monitor V/S, labs and levels as indicated   Height: '5\' 11"'$  (180.3 cm) Weight: 61.8 kg (136 lb 3.9 oz) IBW/kg (Calculated) : 75.3  Temp (24hrs), Avg:95.6 F (35.3 C), Min:90.6 F (32.6 C), Max:97.9 F (36.6 C)  Recent Labs  Lab 02/12/22 0556 02/12/22 0934 02/12/22 1052 02/12/22 1106  WBC 38.3*  --   --   --   CREATININE 2.57* 2.28* 2.20*  --   LATICACIDVEN  --   --   --  4.3*    Estimated Creatinine Clearance: 43.7 mL/min (A) (by C-G formula based on SCr of 2.2 mg/dL (H)).    Allergies  Allergen Reactions   Sulfa Antibiotics Hives and Other (See Comments)    Reaction:  Fever     Antimicrobials this admission: Cefepime 12/9 >>  vancomycin 12/9 >>  Flagyl 12/9>>  Microbiology results: 12/9 BCx: pending 12/9 UCx: pending   MRSA PCR:   Thank you for allowing pharmacy to be a part of this patient's care.  Isac Sarna, BS Pharm D, BCPS Clinical Pharmacist 02/12/2022 1:36 PM

## 2022-02-13 DIAGNOSIS — E876 Hypokalemia: Secondary | ICD-10-CM | POA: Insufficient documentation

## 2022-02-13 DIAGNOSIS — E1010 Type 1 diabetes mellitus with ketoacidosis without coma: Secondary | ICD-10-CM | POA: Diagnosis not present

## 2022-02-13 LAB — PROTIME-INR
INR: 1.4 — ABNORMAL HIGH (ref 0.8–1.2)
Prothrombin Time: 17.4 seconds — ABNORMAL HIGH (ref 11.4–15.2)

## 2022-02-13 LAB — GLUCOSE, CAPILLARY
Glucose-Capillary: 101 mg/dL — ABNORMAL HIGH (ref 70–99)
Glucose-Capillary: 119 mg/dL — ABNORMAL HIGH (ref 70–99)
Glucose-Capillary: 121 mg/dL — ABNORMAL HIGH (ref 70–99)
Glucose-Capillary: 145 mg/dL — ABNORMAL HIGH (ref 70–99)
Glucose-Capillary: 149 mg/dL — ABNORMAL HIGH (ref 70–99)
Glucose-Capillary: 160 mg/dL — ABNORMAL HIGH (ref 70–99)
Glucose-Capillary: 161 mg/dL — ABNORMAL HIGH (ref 70–99)
Glucose-Capillary: 173 mg/dL — ABNORMAL HIGH (ref 70–99)
Glucose-Capillary: 176 mg/dL — ABNORMAL HIGH (ref 70–99)
Glucose-Capillary: 68 mg/dL — ABNORMAL LOW (ref 70–99)
Glucose-Capillary: 91 mg/dL (ref 70–99)

## 2022-02-13 LAB — APTT: aPTT: 26 seconds (ref 24–36)

## 2022-02-13 LAB — BASIC METABOLIC PANEL
Anion gap: 10 (ref 5–15)
BUN: 31 mg/dL — ABNORMAL HIGH (ref 6–20)
CO2: 24 mmol/L (ref 22–32)
Calcium: 7.7 mg/dL — ABNORMAL LOW (ref 8.9–10.3)
Chloride: 102 mmol/L (ref 98–111)
Creatinine, Ser: 0.97 mg/dL (ref 0.61–1.24)
GFR, Estimated: >60 mL/min (ref >60)
Glucose, Bld: 166 mg/dL — ABNORMAL HIGH (ref 70–99)
Potassium: 3.2 mmol/L — ABNORMAL LOW (ref 3.5–5.1)
Sodium: 136 mmol/L (ref 135–145)

## 2022-02-13 LAB — PHOSPHORUS
Phosphorus: 1.6 mg/dL — ABNORMAL LOW (ref 2.5–4.6)
Phosphorus: 1.8 mg/dL — ABNORMAL LOW (ref 2.5–4.6)

## 2022-02-13 LAB — MAGNESIUM
Magnesium: 1.5 mg/dL — ABNORMAL LOW (ref 1.7–2.4)
Magnesium: 1.6 mg/dL — ABNORMAL LOW (ref 1.7–2.4)

## 2022-02-13 MED ORDER — POTASSIUM CHLORIDE CRYS ER 20 MEQ PO TBCR
40.0000 meq | EXTENDED_RELEASE_TABLET | Freq: Once | ORAL | Status: AC
  Administered 2022-02-13: 40 meq via ORAL
  Filled 2022-02-13: qty 2

## 2022-02-13 MED ORDER — POTASSIUM CHLORIDE 20 MEQ PO PACK
40.0000 meq | PACK | Freq: Once | ORAL | Status: AC
  Administered 2022-02-13: 40 meq via ORAL
  Filled 2022-02-13: qty 2

## 2022-02-13 MED ORDER — CARBAMIDE PEROXIDE 10 % MT SOLN: Freq: Three times a day (TID) | OROMUCOSAL | Status: DC

## 2022-02-13 MED ORDER — GUAIFENESIN 100 MG/5ML PO LIQD
10.0000 mL | Freq: Four times a day (QID) | ORAL | Status: DC
  Administered 2022-02-13 – 2022-02-14 (×3): 10 mL via ORAL
  Filled 2022-02-13 (×3): qty 10

## 2022-02-13 MED ORDER — HYDROCOD POLI-CHLORPHE POLI ER 10-8 MG/5ML PO SUER
5.0000 mL | Freq: Two times a day (BID) | ORAL | Status: DC
  Administered 2022-02-13 – 2022-02-14 (×3): 5 mL via ORAL
  Filled 2022-02-13 (×3): qty 5

## 2022-02-13 MED ORDER — CHLORHEXIDINE GLUCONATE CLOTH 2 % EX PADS
6.0000 | MEDICATED_PAD | Freq: Every day | CUTANEOUS | Status: DC
  Administered 2022-02-13: 6 via TOPICAL

## 2022-02-13 MED ORDER — CHLORHEXIDINE GLUCONATE 0.12 % MT SOLN
15.0000 mL | Freq: Three times a day (TID) | OROMUCOSAL | Status: DC
  Administered 2022-02-13 – 2022-02-14 (×3): 15 mL via OROMUCOSAL
  Filled 2022-02-13 (×3): qty 15

## 2022-02-13 MED ORDER — INSULIN NPH (HUMAN) (ISOPHANE) 100 UNIT/ML ~~LOC~~ SUSP
20.0000 [IU] | Freq: Two times a day (BID) | SUBCUTANEOUS | Status: DC
  Administered 2022-02-13 – 2022-02-14 (×2): 20 [IU] via SUBCUTANEOUS
  Filled 2022-02-13: qty 10

## 2022-02-13 NOTE — Assessment & Plan Note (Signed)
-   Magnesium 1.6, 1.5 - Repleted with 2 g of IV magnesium x 1 today

## 2022-02-13 NOTE — Progress Notes (Signed)
IS given to patient, who states he is familiar with it and has knowledge of how to use it. Explained but not demonstrated at this time.IS at bedside within reach for patient to use.

## 2022-02-13 NOTE — Assessment & Plan Note (Signed)
-  Repleting orally

## 2022-02-13 NOTE — Progress Notes (Signed)
Mr. Lehnen refused to take the heparin injection at this time. Educated and explained consequences of not taking. He stated he understood.

## 2022-02-13 NOTE — Inpatient Diabetes Management (Addendum)
Inpatient Diabetes Program Recommendations  AACE/ADA: New Consensus Statement on Inpatient Glycemic Control   Target Ranges:  Prepandial:   less than 140 mg/dL      Peak postprandial:   less than 180 mg/dL (1-2 hours)      Critically ill patients:  140 - 180 mg/dL    Latest Reference Range & Units 02/13/22 01:57 02/13/22 03:04 02/13/22 04:11 02/13/22 06:02 02/13/22 07:44  Glucose-Capillary 70 - 99 mg/dL 176 (H) 161 (H) 173 (H) 145 (H) 119 (H)    Latest Reference Range & Units 02/12/22 20:04 02/12/22 21:02 02/12/22 22:07 02/12/22 23:53 02/13/22 00:56  Glucose-Capillary 70 - 99 mg/dL 169 (H) 154 (H) 160 (H) 172 (H) 149 (H)   Review of Glycemic Control  Diabetes history: DM1; does not make any insulin; requires basal, correction, and carb coverage insulin Outpatient Diabetes medications: 70/30/ 10 units BID Current orders for Inpatient glycemic control: NPH 20 units BID, Novolog 0-15 units Q6H  Inpatient Diabetes Program Recommendations:    Insulin: Patient received Levemir 10 units at 00:02 on 02/13/22 and CBG 119 mg/dl at 7:44 am today. Patient is very sensitive to insulin.  Please consider discontinuing NPH 20 units BID and ordering 70/30 7 units BID and decrease Novolog correction to 0-9 units AC&HS.  NOTE: Noted consult for Diabetes Coordinator. Diabetes Coordinator is not on campus over the weekend but available by pager from 8am to 5pm for questions or concerns. Chart reviewed. Patient is well known to inpatient diabetes team due to multiple admissions with DKA (last admission 12/25/21-12/26/21).   Thanks, Barnie Alderman, RN, MSN, [facility] Diabetes Coordinator Inpatient Diabetes Program [phone] (Team Pager from 8am to [place])

## 2022-02-13 NOTE — Progress Notes (Addendum)
PROGRESS NOTE    Patient: Pedro [name]                            PCP: Neale Burly, MD                    DOB: 06-25-1995            DOA: 02/12/2022 GGY:[id number]             DOS: 02/13/2022, 10:07 AM   LOS: 1 day   Date of Service: The patient was seen and examined on 02/13/2022  Subjective:   The patient was seen and examined this morning. Hemodynamically stable, much improved vital -Diabetic ketoacidosis seems to resolve-received 10 units of Lantus overnight  Patient remained stable overnight   Brief Narrative:   Pedro [name] is a 26 year old male with extensive history of DM1, HTN, ADHD, gastroparesis, neuropathy, history of polysubstance abuse noncompliant with severe hyperglycemia, and diabetic ketoacidotic state again. Patient presented with altered mental status, mild shortness of breath.  Started on IV fluid, insulin drip, has regained consciousness, Per ED report patient was dropped off to ED by significant other who promptly left the premises.   Multiple hospitalization for same complaint, DKA!!!!!  Compliant!  ED course:  Vitals:   02/12/22 0730 02/12/22 0800  BP: (!) 151/105 (!) 151/92  Pulse: (!) 131 (!) 134  Resp: (!) 30 (!) 30  Temp:    SpO2: 99% 97%    ABG    Component Value Date/Time   PHART 7.336 (L) 04/30/2014 1215   PCO2ART 25.0 (L) 04/30/2014 1215   PO2ART 93.4 04/30/2014 1215   HCO3 4.2 (L) 02/12/2022 0628   TCO2 11.3 04/30/2014 1215   ACIDBASEDEF 27.0 (H) 02/12/2022 0628   O2SAT 87.7 02/12/2022 0628   CBG (last 3) > 1200 Recent Labs    02/12/22 0552 02/12/22 0701 02/12/22 0738  GLUCAP >600* >600* >600*    WBC 38.3, sodium 120, potassium 6.4, chloride 78, bicarb<7, glucose>1200, BUN 30, creatinine 2.57, anion gap not calculated GFR 34  UA Clear -Urine drug screen positive for marijuana -X-ray clear    Assessment & Plan:   Principal Problem:   DKA (diabetic ketoacidosis) ([place]) Active Problems:    Polysubstance dependence ([place])   Acute kidney injury ([place])   Sepsis ([place])   Diabetic ketoacidosis without coma associated with type 1 diabetes mellitus ([place])   Hypokalemia   Hypomagnesemia   Diabetic neuropathy ([facility])   Hyperkalemia   Noncompliance   Polysubstance abuse ([place])   DM type 1 (diabetes mellitus, type 1) ([place])   Hyponatremia     Assessment and Plan: * DKA (diabetic ketoacidosis) ([place]) - Resolved CBG (last 3)  Recent Labs    02/13/22 0411 02/13/22 0602 02/13/22 0744  GLUCAP 173* 145* 119*    -Hemodynamically stable now  -S/P Severe diabetic acidosis ABG    Component Value Date/Time   PHART 7.22 (L) 02/12/2022 1036   PCO2ART <18 (LL) 02/12/2022 1036   PO2ART 67 (L) 02/12/2022 1036   HCO3 6.0 (L) 02/12/2022 1036   TCO2 11.3 04/30/2014 1215   ACIDBASEDEF 19.4 (H) 02/12/2022 1036   O2SAT 95.5 02/12/2022 1036   -Much improved tachycardia, tachypnea  -Currently conscious -Improved diabetic ketoacidotic state,  -Initiating long-acting insulin, resuming home regimen of NPH -Off insulin drip, IV fluids, bicarb drip -Checking CBG ACHS, SSI coverage  -Monitoring closely as patient also has a hypokalemia  Sepsis ([place]) - Met sepsis criteria on admission, patient was delivered with severe DKA -Nevertheless due to lactic acidosis, leukocytosis, elevated procalcitonin:  Initiated broad-spectrum antibiotics with vancomycin cefepime and Flagyl >>> with anticipation of quick taper -Blood and urine cultures been obtained>>> will follow closely  -Afebrile, normotensive now, improved leukocytosis, resolved lactic acidosis   Acute kidney injury ([place]) -Dehydration, diabetic ketoacidosis -Continue IV fluids, monitoring kidney function closely Lab Results  Component Value Date   CREATININE 0.97 02/13/2022   CREATININE 1.08 02/12/2022   CREATININE 1.34 (H) 02/12/2022     Polysubstance dependence ([place]) - Urine drug screen only positive for marijuana at this  point -History of cocaine marijuana and tobacco use  Hypomagnesemia - Magnesium 1.6, 1.5 - Repleted with 2 g of IV magnesium x 1 today  Hypokalemia -Repleting orally  Diabetic ketoacidosis without coma associated with type 1 diabetes mellitus ([place]) -Diabetic your acidotic state improved-resolved -Status post aggressive treatment with IV fluid resuscitation, insulin drip, bicarb>> discontinued now  -Started patient on SSI coverage -Received 10 units of Lantus overnight -Resuming home medication of 70/30 20 units twice daily Anticipating initiating 10 units this morning, and resume with 20 units this evening   Diabetic neuropathy ([place]) - Tolerating resuming home medication of Neurontin  Hyponatremia - Pseudohyponatremia serum sodium 120 on admission>  136 now  -Due to diabetic ketoacidotic state -Improved with aggressive treatment of DKA  DM type 1 (diabetes mellitus, type 1) ([place]) - Uncontrolled -Noncompliant -Last A1c 6/15 >>>>  9.7 -Resuming home medication of NovoLog 70/30 at 20 units twice daily   Polysubstance abuse ([place]) - Urine drug screen positive for marijuana -Patient has been counseled regarding abstaining from street drugs including marijuana use  Noncompliance - Discussed with patient regarding compliance with home regimen insulin regimen -Diabetic diet -And the consequences of noncompliance with repeated episodes of DKA and possible death  Hyperkalemia -On admission serum potassium 6.4 >>> 3.2          -----------------------------------------------------------------------------------------------------------------------------------------------Cultures; Blood Cultures x 2 >> NGT Urine Culture  >>> NGT   ------------------------------------------------------------------------------------------------------------------------------------------------  DVT prophylaxis:  heparin injection 5,000 Units Start: 02/12/22 1400 SCDs Start: 02/12/22 0750 TED  hose Start: 02/12/22 0744 SCDs Start: 02/12/22 0744   Code Status:   Code Status: Full Code  Family Communication: No family member present at bedside- attempt will be made to update daily The above findings and plan of care has been discussed with patient (and family)  in detail,  they expressed understanding and agreement of above. -Advance care planning has been discussed.   Admission status:   Status is: Inpatient Remains inpatient appropriate because: Needing IV fluids, IV insulin, IV antibiotics, ruling out sepsis     Procedures:   No admission procedures for hospital encounter.   Antimicrobials:  Anti-infectives (From admission, onward)    Start     Dose/Rate Route Frequency Ordered Stop   02/13/22 1400  vancomycin (VANCOCIN) IVPB 1000 mg/200 mL premix        1,000 mg 200 mL/hr over 60 Minutes Intravenous Every 24 hours 02/12/22 1341     02/12/22 2200  ceFEPIme (MAXIPIME) 2 g in sodium chloride 0.9 % 100 mL IVPB        2 g 200 mL/hr over 30 Minutes Intravenous Every 12 hours 02/12/22 1341     02/12/22 1500  vancomycin (VANCOREADY) IVPB 1250 mg/250 mL        1,250 mg 166.7 mL/hr over 90 Minutes Intravenous  Once 02/12/22 1329 02/12/22 1816  02/12/22 1415  ceFEPIme (MAXIPIME) 2 g in sodium chloride 0.9 % 100 mL IVPB        2 g 200 mL/hr over 30 Minutes Intravenous  Once 02/12/22 1329 02/12/22 1815   02/12/22 1415  metroNIDAZOLE (FLAGYL) IVPB 500 mg        500 mg 100 mL/hr over 60 Minutes Intravenous Every 12 hours 02/12/22 1329 02/19/22 1414        Medication:   chlorhexidine  15 mL Mouth/Throat TID   Chlorhexidine Gluconate Cloth  6 each Topical Daily   gabapentin  300 mg Oral QHS   heparin  5,000 Units Subcutaneous Q8H   insulin aspart  0-15 Units Subcutaneous Q6H   insulin NPH Human  20 Units Subcutaneous BID AC & HS   lamoTRIgine  25 mg Oral BID   nicotine  14 mg Transdermal Daily   sodium chloride flush  3 mL Intravenous Q12H   sodium chloride flush   3 mL Intravenous Q12H    sodium chloride, acetaminophen **OR** acetaminophen, bisacodyl, dextrose, hydrALAZINE, HYDROmorphone (DILAUDID) injection, influenza vac split quadrivalent PF, ipratropium, levalbuterol, morphine injection, ondansetron **OR** ondansetron (ZOFRAN) IV, oxyCODONE, pneumococcal 23 valent vaccine, senna-docusate, sodium chloride flush, sodium phosphate, zolpidem   Objective:   Vitals:   02/13/22 0300 02/13/22 0400 02/13/22 0500 02/13/22 0730  BP: 121/87 109/70 (!) 125/92   Pulse: 82 88 83   Resp: _0 Temp:  99 F (37.2 C)  97.9 F (36.6 C)  TempSrc:  Rectal  Axillary  SpO2: 100% 100% 98%   Weight:   62.3 kg   Height:        Intake/Output Summary (Last 24 hours) at 02/13/2022 1007 Last data filed at 02/13/2022 0600 Gross per 24 hour  Intake 7732.04 ml  Output 3725 ml  Net 4007.04 ml   Filed Weights   02/12/22 0800 02/12/22 1134 02/13/22 0500  Weight: 58.5 kg 61.8 kg 62.3 kg     Examination:   Physical Exam  Constitution:  Alert, cooperative, no distress,  Appears calm and comfortable  Psychiatric:   Normal and stable mood and affect, cognition intact,   HEENT:        Normocephalic, PERRL, otherwise with in Normal limits  Chest:         Chest symmetric Cardio vascular:  S1/S2, RRR, No murmure, No Rubs or Gallops  pulmonary: Clear to auscultation bilaterally, respirations unlabored, negative wheezes / crackles Abdomen: Soft, non-tender, non-distended, bowel sounds,no masses, no organomegaly Muscular skeletal: Limited exam - in bed, able to move all 4 extremities,   Neuro: CNII-XII intact. , normal motor and sensation, reflexes intact  Extremities: No pitting edema lower extremities, +2 pulses  Skin: Dry, warm to touch, negative for any Rashes, No open wounds Wounds: per nursing documentation   ------------------------------------------------------------------------------------------------------------------------------------------     LABs:     Latest Ref Rng & Units 02/13/2022    3:39 AM 02/12/2022    6:04 AM 02/12/2022    5:56 AM  CBC  WBC 4.0 - 10.5 K/uL 18.6   38.3   Hemoglobin 13.0 - 17.0 g/dL 12.5  17.3  14.4   Hematocrit 39.0 - 52.0 % 36.5  51.0  48.6   Platelets 150 - 400 K/uL 232   403       Latest Ref Rng & Units 02/13/2022    3:39 AM 02/12/2022    9:45 PM 02/12/2022    4:12 PM  CMP  Glucose 70 - 99 mg/dL 166  176  329   BUN 6 - 20 mg/dL 31  35  42   Creatinine 0.61 - 1.24 mg/dL 0.97  1.08  1.34   Sodium 135 - 145 mmol/L 136  136  137   Potassium 3.5 - 5.1 mmol/L 3.2  3.2  3.9   Chloride 98 - 111 mmol/L 102  103  104   CO2 22 - 32 mmol/L _0 Calcium 8.9 - 10.3 mg/dL 7.7  7.5  8.0        Micro Results Recent Results (from the past 240 hour(s))  Resp Panel by RT-PCR (Flu A&B, Covid) Anterior Nasal Swab     Status: None   Collection Time: 02/12/22  6:10 AM   Specimen: Anterior Nasal Swab  Result Value Ref Range Status   SARS Coronavirus 2 by RT PCR NEGATIVE NEGATIVE Final    Comment: (NOTE) SARS-CoV-2 target nucleic acids are NOT DETECTED.  The SARS-CoV-2 RNA is generally detectable in upper respiratory specimens during the acute phase of infection. The lowest concentration of SARS-CoV-2 viral copies this assay can detect is 138 copies/mL. A negative result does not preclude SARS-Cov-2 infection and should not be used as the sole basis for treatment or other patient management decisions. A negative result may occur with  improper specimen collection/handling, submission of specimen other than nasopharyngeal swab, presence of viral mutation(s) within the areas targeted by this assay, and inadequate number of viral copies(<138 copies/mL). A negative result must be combined with clinical observations, patient history, and epidemiological information. The expected result is Negative.  Fact Sheet for Patients:  EntrepreneurPulse.com.au  Fact Sheet for Healthcare  Providers:  IncredibleEmployment.be  This test is no t yet approved or cleared by the [place] FDA and  has been authorized for detection and/or diagnosis of SARS-CoV-2 by FDA under an Emergency Use Authorization (EUA). This EUA will remain  in effect (meaning this test can be used) for the duration of the COVID-19 declaration under Section 564(b)(1) of the Act, 21 U.S.C.section 360bbb-3(b)(1), unless the authorization is terminated  or revoked sooner.       Influenza A by PCR NEGATIVE NEGATIVE Final   Influenza B by PCR NEGATIVE NEGATIVE Final    Comment: (NOTE) The Xpert Xpress SARS-CoV-2/FLU/RSV plus assay is intended as an aid in the diagnosis of influenza from Nasopharyngeal swab specimens and should not be used as a sole basis for treatment. Nasal washings and aspirates are unacceptable for Xpert Xpress SARS-CoV-2/FLU/RSV testing.  Fact Sheet for Patients: EntrepreneurPulse.com.au  Fact Sheet for Healthcare Providers: IncredibleEmployment.be  This test is not yet approved or cleared by the [place] FDA and has been authorized for detection and/or diagnosis of SARS-CoV-2 by FDA under an Emergency Use Authorization (EUA). This EUA will remain in effect (meaning this test can be used) for the duration of the COVID-19 declaration under Section 564(b)(1) of the Act, 21 U.S.C. section 360bbb-3(b)(1), unless the authorization is terminated or revoked.  Performed at [facility], [address] [place], [place] [address]   MRSA Next Gen by PCR, Nasal     Status: None   Collection Time: 02/12/22 11:40 AM   Specimen: Nasal Mucosa; Nasal Swab  Result Value Ref Range Status   MRSA by PCR Next Gen NOT DETECTED NOT DETECTED Final    Comment: (NOTE) The GeneXpert MRSA Assay (FDA approved for NASAL specimens only), is one component of a comprehensive MRSA colonization surveillance program. It is not intended to  diagnose  MRSA infection nor to guide or monitor treatment for MRSA infections. Test performance is not FDA approved in patients less than 2 years old. Performed at [facility], [address] [place], [place] [address]   Culture, blood (Routine X 2) w Reflex to ID Panel     Status: None (Preliminary result)   Collection Time: 02/12/22  2:07 PM   Specimen: BLOOD RIGHT HAND  Result Value Ref Range Status   Specimen Description   Final    BLOOD RIGHT HAND BOTTLES DRAWN AEROBIC AND ANAEROBIC   Special Requests   Final    Blood Culture results may not be optimal due to an excessive volume of blood received in culture bottles   Culture   Final    NO GROWTH < 12 HOURS Performed at [facility], [address], [place], [place] [address]    Report Status PENDING  Incomplete  Culture, blood (Routine X 2) w Reflex to ID Panel     Status: None (Preliminary result)   Collection Time: 02/12/22  2:07 PM   Specimen: BLOOD LEFT HAND  Result Value Ref Range Status   Specimen Description   Final    BLOOD LEFT HAND BOTTLES DRAWN AEROBIC AND ANAEROBIC   Special Requests   Final    Blood Culture results may not be optimal due to an excessive volume of blood received in culture bottles   Culture   Final    NO GROWTH < 12 HOURS Performed at [facility], [address], [place], [place] [address]    Report Status PENDING  Incomplete    Radiology Reports DG CHEST PORT 1 VIEW  Result Date: 02/12/2022 CLINICAL DATA:  Shortness of breath. EXAM: PORTABLE CHEST 1 VIEW COMPARISON:  02/12/22 CXR FINDINGS: No pleural effusion. No pneumothorax. Unchanged cardiac and mediastinal contours. Bibasilar pulmonary opacities are new from prior. No displaced rib fractures. Visualized upper abdomen is unremarkable IMPRESSION: New bibasilar pulmonary opacities, which could represent findings related to interval aspiration or new infection. Electronically Signed   By: Marin Roberts M.D.   On: 02/12/2022 15:25    SIGNED:  Deatra James, MD, FHM. [facility],  Pager (please use [email or web address] to page/text) Please use Epic Secure Chat for non-urgent communication (7AM-7PM)  > 75 minutes of critical care time was spent in ICU initiating and treating DKA, reviewing LABs, Meds,medical records, discussing care with the patient, and ICU staff..  And drawn plan of care.  If 7PM-7AM, please contact night-coverage [email or web address], 02/13/2022, 10:07 AM

## 2022-02-13 NOTE — Progress Notes (Signed)
Patient received in bed resting and no distress noted. Tele monitoring was reading ST elevation in lead. Patient denied chest pain and do not appear in any distress. 12 ECG obtained and no ST elevation noted in any of the leads. Patient noted with expiratory wheezing and productive cough, MD and RT made aware. RT stated they will assess and will administer respiratory treatments and will bring an incentive spirometer as well. Patient weaned down to 4L [place] with sats at 94% Safety maintained, Call bell kept within reach.

## 2022-02-13 NOTE — Progress Notes (Signed)
Patient up to bedside chair, ambulated without any issues in the room. RN recommend supervision with ambulation due to potential tripping with monitors. Cough suppressant administered as ordered and noted to reduce the coughing. Patient encouraged to cough up phlegm into specimen cup for lab analysis. Patient is in the bedside chair at this time. Call bell and personal items within reach.

## 2022-02-14 DIAGNOSIS — E876 Hypokalemia: Secondary | ICD-10-CM | POA: Diagnosis not present

## 2022-02-14 DIAGNOSIS — N179 Acute kidney failure, unspecified: Secondary | ICD-10-CM | POA: Diagnosis not present

## 2022-02-14 DIAGNOSIS — Z91199 Patient's noncompliance with other medical treatment and regimen due to unspecified reason: Secondary | ICD-10-CM

## 2022-02-14 DIAGNOSIS — E1010 Type 1 diabetes mellitus with ketoacidosis without coma: Secondary | ICD-10-CM | POA: Diagnosis not present

## 2022-02-14 LAB — CBC
HCT: 39.4 % (ref 39.0–52.0)
Hemoglobin: 13.3 g/dL (ref 13.0–17.0)
MCH: 29.8 pg (ref 26.0–34.0)
MCHC: 33.8 g/dL (ref 30.0–36.0)
MCV: 88.1 fL (ref 80.0–100.0)
Platelets: 218 10*3/uL (ref 150–400)
RBC: 4.47 MIL/uL (ref 4.22–5.81)
RDW: 13.5 % (ref 11.5–15.5)
WBC: 15.6 10*3/uL — ABNORMAL HIGH (ref 4.0–10.5)
nRBC: 0 % (ref 0.0–0.2)

## 2022-02-14 LAB — BASIC METABOLIC PANEL
BUN: 57 mg/dL — ABNORMAL HIGH (ref 6–20)
CO2: <7 mmol/L — ABNORMAL LOW (ref 22–32)
Calcium: 8.5 mg/dL — ABNORMAL LOW (ref 8.9–10.3)
Chloride: 78 mmol/L — ABNORMAL LOW (ref 98–111)
Creatinine, Ser: 2.57 mg/dL — ABNORMAL HIGH (ref 0.61–1.24)
GFR, Estimated: 34 mL/min — ABNORMAL LOW (ref >60)
Glucose, Bld: >1200 mg/dL (ref 70–99)
Potassium: 6.4 mmol/L (ref 3.5–5.1)
Sodium: 120 mmol/L — ABNORMAL LOW (ref 135–145)

## 2022-02-14 LAB — URINE CULTURE: Culture: <10000 — AB

## 2022-02-14 LAB — PHOSPHORUS: Phosphorus: 1.3 mg/dL — ABNORMAL LOW (ref 2.5–4.6)

## 2022-02-14 LAB — GLUCOSE, CAPILLARY
Glucose-Capillary: 216 mg/dL — ABNORMAL HIGH (ref 70–99)
Glucose-Capillary: 206 mg/dL — ABNORMAL HIGH (ref 70–99)

## 2022-02-14 LAB — MAGNESIUM: Magnesium: 1.8 mg/dL (ref 1.7–2.4)

## 2022-02-14 MED ORDER — AMOXICILLIN-POT CLAVULANATE 875-125 MG PO TABS: 1.0000 | ORAL_TABLET | Freq: Two times a day (BID) | ORAL | Status: DC

## 2022-02-14 MED ORDER — AMOXICILLIN-POT CLAVULANATE 875-125 MG PO TABS: 1.0000 | ORAL_TABLET | Freq: Two times a day (BID) | ORAL | 0 refills | Status: AC

## 2022-02-14 MED ORDER — LOSARTAN POTASSIUM 50 MG PO TABS: 50.0000 mg | ORAL_TABLET | Freq: Every day | ORAL | 11 refills | Status: DC

## 2022-02-14 MED ORDER — NICOTINE 14 MG/24HR TD PT24: 14.0000 mg | MEDICATED_PATCH | Freq: Every day | TRANSDERMAL | 0 refills | Status: DC

## 2022-02-14 MED ORDER — GUAIFENESIN ER 600 MG PO TB12: 600.0000 mg | ORAL_TABLET | Freq: Two times a day (BID) | ORAL | 0 refills | Status: AC

## 2022-02-14 MED ORDER — LAMOTRIGINE 25 MG PO TABS: 25.0000 mg | ORAL_TABLET | Freq: Two times a day (BID) | ORAL | 3 refills | Status: DC

## 2022-02-14 MED ORDER — INSULIN NPH ISOPHANE & REGULAR (70-30) 100 UNIT/ML ~~LOC~~ SUSP: 10.0000 [IU] | Freq: Two times a day (BID) | SUBCUTANEOUS | 11 refills | Status: DC

## 2022-02-14 MED ORDER — POTASSIUM PHOSPHATES 15 MMOLE/5ML IV SOLN
30.0000 mmol | Freq: Once | INTRAVENOUS | Status: AC
  Administered 2022-02-14: 30 mmol via INTRAVENOUS
  Filled 2022-02-14: qty 10

## 2022-02-14 NOTE — TOC Transition Note (Signed)
Transition of Care [facility]) - CM/SW Discharge Note   Patient Details  Name: Pedro [name] MRN: [id number] Date of Birth: December 21, 1995  [facility]) CM/SW Contact:  Boneta Lucks, RN Phone Number: 02/14/2022, 10:46 AM   Clinical Narrative:   Patient discharging home admitted with DKA. Patient has PCP, Insurance, PT has not follow up needed.     Final next level of care: Home/Self Care Barriers to Discharge: Barriers Resolved   Patient Goals and CMS Choice Patient states their goals for this hospitalization and ongoing recovery are:: to go home. CMS Medicare.gov Compare Post Acute Care list provided to:: Patient    Discharge Placement                Patient and family notified of of transfer: 02/14/22  Discharge Plan and [place] - no needs   Social Determinants of Health (SDOH) Interventions Utilities Interventions: Intervention Not Indicated   Readmission Risk Interventions    02/14/2022   10:46 AM 12/15/2020   12:07 PM  Readmission Risk Prevention Plan  Transportation Screening Complete Complete  PCP or Specialist Appt within 5-7 Days  Complete  Home Care Screening  Complete  Medication Review (RN CM)  Complete  Medication Review ([place]) Complete   PCP or Specialist appointment within 3-5 days of discharge Complete   HRI or [place] Not Complete   SW Recovery Care/Counseling Consult Not Complete   Palliative Care Screening Not [place] Not Applicable

## 2022-02-14 NOTE — Discharge Summary (Signed)
Pedro [name], is a 26 y.o. male  DOB [birth year]  MRN [id number].  Admission date:  02/12/2022  Admitting Physician  Deatra James, MD  Discharge Date:  02/14/2022   Primary MD  Neale Burly, MD  Recommendations for primary care physician for things to follow:  1)Take Insulin and other medications as prescribed 2)Follow-up with PCP in 1 to 2 weeks for recheck and reevaluation 3)Complete Abstinence from Tobacco advised 4)Compliance with Diabetic diet and avoidance of sugars and sweets advised  Admission Diagnosis  Dehydration [E86.0] DKA (diabetic ketoacidosis) ([place]) [E11.10] AKI (acute kidney injury) ([place]) [N17.9] Hypothermia, initial encounter [T68.XXXA] Diabetic ketoacidosis without coma associated with type 1 diabetes mellitus ([place]) [E10.10]   Discharge Diagnosis  Dehydration [E86.0] DKA (diabetic ketoacidosis) ([place]) [E11.10] AKI (acute kidney injury) ([place]) [N17.9] Hypothermia, initial encounter [T68.XXXA] Diabetic ketoacidosis without coma associated with type 1 diabetes mellitus ([place]) [E10.10]    Principal Problem:   DKA (diabetic ketoacidosis) ([place]) Active Problems:   Polysubstance dependence ([place])   Acute kidney injury ([place])   Sepsis ([place])   Diabetic ketoacidosis without coma associated with type 1 diabetes mellitus ([place])   Hypokalemia   Hypomagnesemia   Diabetic neuropathy ([place])   Hyperkalemia   Noncompliance   Polysubstance abuse ([place])   DM type 1 (diabetes mellitus, type 1) ([place])   Hyponatremia      Past Medical History:  Diagnosis Date   Acute kidney injury ([place]) 08/19/2021   ADHD (attention deficit hyperactivity disorder)    AKI (acute kidney injury) ([place]) 10/17/2014   Diabetic ketoacidosis ([place]) 10/05/2013   DKA, type 1 ([place]) 12/15/2013   DM type 1 (diabetes mellitus, type 1) ([place])    diagnosed age 41   Gastroenteritis 02/16/2014   Genital herpes simplex  type 2 12/15/2013   Herpes    High cholesterol    Hyperkalemia 10/15/2012   Hypertension    Noncompliance 10/15/2012   Noncompliance with medications    Polysubstance dependence ([place]) 07/22/2011   SIRS (systemic inflammatory response syndrome) ([place]) 12/25/2021   Substance induced mood disorder ([place]) 07/22/2011   Thrombocytosis 08/19/2021   Tobacco abuse 12/15/2013    Past Surgical History:  Procedure Laterality Date   INTERCOSTAL NERVE BLOCK Right 12/10/2020   Procedure: INTERCOSTAL NERVE BLOCK;  Surgeon: Lajuana Matte, MD;  Location: [facility];  Service: Thoracic;  Laterality: Right;   none     PLEURADESIS Right 12/10/2020   Procedure: MECHANICAL PLEURADESIS;  Surgeon: Lajuana Matte, MD;  Location: [facility];  Service: Thoracic;  Laterality: Right;   PLEURECTOMY Right 12/10/2020   Procedure: APICAL PLEURECTOMY;  Surgeon: Lajuana Matte, MD;  Location: [facility];  Service: Thoracic;  Laterality: Right;     HPI  from the history and physical done on the day of admission:    Pedro [name] is a 26 year old male with extensive history of DM1, HTN, ADHD, gastroparesis, neuropathy, history of polysubstance abuse noncompliant with severe hyperglycemia, and diabetic ketoacidotic state again. Patient presented with altered mental status, mild shortness  of breath.  Started on IV fluid, insulin drip, has regained consciousness, Per ED report patient was dropped off to ED by significant other who promptly left the premises.     Multiple hospitalization for same complaint, DKA!!!!!  Compliant!    Hospital Course:     1)DKA in a patient with type 1 diabetes---POA -bicarb of on admission was less than 7, anion gap of on admission was 28, serum glucose of on admission was greater than 1200 and beta hydroxybutyric acid of on admission was greater than 8 -Treated with aggressive IV fluids, IV insulin,  -DKA pathophysiology resolved with IV fluids and IV insulin -Diabetic educator  consult appreciated -Patient is back on subcu insulin-- -discharged on 70/30 insulin please see discharge med rec -Outpatient follow-up PCP for further adjustments advised  2)AKI----acute kidney injury --in the setting of dehydration and DKA -Creatinine on admission was 2.57 -Creatinine is now down to 0.9 after hydration as above #1 - , renally adjust medications, avoid nephrotoxic agents / dehydration  / hypotension   3)Presumed Sepsis secondary to aspiration pneumonia--- clinically much improved  -WBC 38.3 >> 18.6 >>15.6 -Afebrile -Blood and urine culture negative -Okay to discharge on Augmentin and mucolytics  4) history of polysubstance abuse--patient is not interested in rehab  5)DM1-A1c was 9.7 back in June 2023 reflecting uncontrolled diabetes with hyperglycemia -Insulin regimen as above #1  6) hypokalemia/hypomagnesemia/hypophosphatemia--- replaced  7) history of medication and lifestyle noncompliance--- despite advised patient remains notoriously noncompliant with medications including insulin, diet and lifestyle modifications advised  8)HTN--discharged on losartan  Discharge Condition: Stable  Follow UP---PCP   Follow-up Information     Hasanaj, Samul Dada, MD. Schedule an appointment as soon as possible for a visit in 1 week(s).   Specialty: Internal Medicine Contact information: [place] [address] 357 [phone]                  Consults obtained -diabetic educator  Diet and Activity recommendation:  As advised  Discharge Instructions    Discharge Instructions     Call MD for:  difficulty breathing, headache or visual disturbances   Complete by: As directed    Call MD for:  persistant dizziness or light-headedness   Complete by: As directed    Call MD for:  persistant nausea and vomiting   Complete by: As directed    Call MD for:  temperature >100.4   Complete by: As directed    Diet - low sodium heart healthy   Complete by:  As directed    Diet Carb Modified   Complete by: As directed    Discharge instructions   Complete by: As directed    1)Take Insulin and other medications as prescribed 2)Follow-up with PCP in 1 to 2 weeks for recheck and reevaluation 3)Complete Abstinence from Tobacco advised 4)Compliance with Diabetic diet and avoidance of sugars and sweets advised   Increase activity slowly   Complete by: As directed        Discharge Medications     Allergies as of 02/14/2022       Reactions   Sulfa Antibiotics Hives, Other (See Comments)   Fever        Medication List     STOP taking these medications    lisinopril 10 MG tablet Commonly known as: ZESTRIL       TAKE these medications    amoxicillin-clavulanate 875-125 MG tablet Commonly known as: AUGMENTIN Take 1 tablet by mouth 2 (two) times  daily for 5 days.   ELDERBERRY PO Take 1 tablet by mouth daily as needed (immune support).   gabapentin 300 MG capsule Commonly known as: NEURONTIN Take 1 capsule (300 mg total) by mouth at bedtime.   guaiFENesin 600 MG 12 hr tablet Commonly known as: Mucinex Take 1 tablet (600 mg total) by mouth 2 (two) times daily.   insulin NPH-regular Human (70-30) 100 UNIT/ML injection Inject 10 Units into the skin 2 (two) times daily with a meal. What changed: how much to take   lamoTRIgine 25 MG tablet Commonly known as: LAMICTAL Take 1 tablet (25 mg total) by mouth 2 (two) times daily.   losartan 50 MG tablet Commonly known as: Cozaar Take 1 tablet (50 mg total) by mouth daily.   nicotine 14 mg/24hr patch Commonly known as: NICODERM CQ - dosed in mg/24 hours Place 1 patch (14 mg total) onto the skin daily. Start taking on: February 15, 2022        Major procedures and Radiology Reports - PLEASE review detailed and final reports for all details, in brief -   DG CHEST PORT 1 VIEW  Result Date: 02/12/2022 CLINICAL DATA:  Shortness of breath. EXAM: PORTABLE CHEST 1 VIEW  COMPARISON:  02/12/22 CXR FINDINGS: No pleural effusion. No pneumothorax. Unchanged cardiac and mediastinal contours. Bibasilar pulmonary opacities are new from prior. No displaced rib fractures. Visualized upper abdomen is unremarkable IMPRESSION: New bibasilar pulmonary opacities, which could represent findings related to interval aspiration or new infection. Electronically Signed   By: Marin Roberts M.D.   On: 02/12/2022 15:25   DG Chest Portable 1 View  Result Date: 02/12/2022 CLINICAL DATA:  Short of breath EXAM: PORTABLE CHEST 1 VIEW COMPARISON:  Prior chest x-ray 12/25/2021 FINDINGS: The lungs are clear and negative for focal airspace consolidation, pulmonary edema or suspicious pulmonary nodule. No pleural effusion or pneumothorax. Changed sutures are present in the medial right lung apex. No evidence of pneumothorax or other complication. Cardiac and mediastinal contours are within normal limits. No acute fracture or lytic or blastic osseous lesions. The visualized upper abdominal bowel gas pattern is unremarkable. IMPRESSION: No active disease. Electronically Signed   By: Jacqulynn Cadet M.D.   On: 02/12/2022 06:39    Micro Results   Recent Results (from the past 240 hour(s))  Urine Culture     Status: Abnormal   Collection Time: 02/12/22  5:56 AM   Specimen: Urine, Clean Catch  Result Value Ref Range Status   Specimen Description   Final    URINE, CLEAN CATCH Performed at [facility], [address], [place], [place] [address]    Special Requests   Final    NONE Performed at [facility], [address] [place], [place] [address]    Culture (A)  Final    <10,000 COLONIES/mL INSIGNIFICANT GROWTH Performed at [place] [place] [address] [place],  [address]    Report Status 02/14/2022 FINAL  Final  Resp Panel by RT-PCR (Flu A&B, Covid) Anterior Nasal Swab     Status: None   Collection Time: 02/12/22  6:10 AM   Specimen: Anterior Nasal Swab  Result Value Ref Range  Status   SARS Coronavirus 2 by RT PCR NEGATIVE NEGATIVE Final    Comment: (NOTE) SARS-CoV-2 target nucleic acids are NOT DETECTED.  The SARS-CoV-2 RNA is generally detectable in upper respiratory specimens during the acute phase of infection. The lowest concentration of SARS-CoV-2 viral copies this assay can detect is 138  copies/mL. A negative result does not preclude SARS-Cov-2 infection and should not be used as the sole basis for treatment or other patient management decisions. A negative result may occur with  improper specimen collection/handling, submission of specimen other than nasopharyngeal swab, presence of viral mutation(s) within the areas targeted by this assay, and inadequate number of viral copies(<138 copies/mL). A negative result must be combined with clinical observations, patient history, and epidemiological information. The expected result is Negative.  Fact Sheet for Patients:  EntrepreneurPulse.com.au  Fact Sheet for Healthcare Providers:  IncredibleEmployment.be  This test is no t yet approved or cleared by the [place] FDA and  has been authorized for detection and/or diagnosis of SARS-CoV-2 by FDA under an Emergency Use Authorization (EUA). This EUA will remain  in effect (meaning this test can be used) for the duration of the COVID-19 declaration under Section 564(b)(1) of the Act, 21 U.S.C.section 360bbb-3(b)(1), unless the authorization is terminated  or revoked sooner.       Influenza A by PCR NEGATIVE NEGATIVE Final   Influenza B by PCR NEGATIVE NEGATIVE Final    Comment: (NOTE) The Xpert Xpress SARS-CoV-2/FLU/RSV plus assay is intended as an aid in the diagnosis of influenza from Nasopharyngeal swab specimens and should not be used as a sole basis for treatment. Nasal washings and aspirates are unacceptable for Xpert Xpress SARS-CoV-2/FLU/RSV testing.  Fact Sheet for  Patients: EntrepreneurPulse.com.au  Fact Sheet for Healthcare Providers: IncredibleEmployment.be  This test is not yet approved or cleared by the [place] FDA and has been authorized for detection and/or diagnosis of SARS-CoV-2 by FDA under an Emergency Use Authorization (EUA). This EUA will remain in effect (meaning this test can be used) for the duration of the COVID-19 declaration under Section 564(b)(1) of the Act, 21 U.S.C. section 360bbb-3(b)(1), unless the authorization is terminated or revoked.  Performed at [facility], [address], [place], [place] [address]   MRSA Next Gen by PCR, Nasal     Status: None   Collection Time: 02/12/22 11:40 AM   Specimen: Nasal Mucosa; Nasal Swab  Result Value Ref Range Status   MRSA by PCR Next Gen NOT DETECTED NOT DETECTED Final    Comment: (NOTE) The GeneXpert MRSA Assay (FDA approved for NASAL specimens only), is one component of a comprehensive MRSA colonization surveillance program. It is not intended to diagnose MRSA infection nor to guide or monitor treatment for MRSA infections. Test performance is not FDA approved in patients less than 63 years old. Performed at [facility], [address] [place], [place] [address]   Culture, blood (Routine X 2) w Reflex to ID Panel     Status: None (Preliminary result)   Collection Time: 02/12/22  2:07 PM   Specimen: BLOOD RIGHT HAND  Result Value Ref Range Status   Specimen Description   Final    BLOOD RIGHT HAND BOTTLES DRAWN AEROBIC AND ANAEROBIC   Special Requests   Final    Blood Culture results may not be optimal due to an excessive volume of blood received in culture bottles   Culture   Final    NO GROWTH 2 DAYS Performed at [facility] [address] [place], [place] [address]    Report Status PENDING  Incomplete  Culture, blood (Routine X 2) w Reflex to ID Panel     Status: None (Preliminary result)   Collection Time: 02/12/22  2:07  PM   Specimen: BLOOD LEFT HAND  Result Value Ref Range Status  Specimen Description   Final    BLOOD LEFT HAND BOTTLES DRAWN AEROBIC AND ANAEROBIC   Special Requests   Final    Blood Culture results may not be optimal due to an excessive volume of blood received in culture bottles   Culture   Final    NO GROWTH 2 DAYS Performed at [facility], [address] [place], [place] [address]    Report Status PENDING  Incomplete   Today   Subjective    Uzoma Vivona today has no new complaints  No fever  Or chills   No Nausea, Vomiting or Diarrhea -Eating and drinking well, -Ambulating around the room, no new concerns       Patient has been seen and examined prior to discharge   Objective   Blood pressure (!) 136/100, pulse 92, temperature 97.9 F (36.6 C), temperature source Oral, resp. rate 14, height '5\' 11"'$  (1.803 m), weight 61.7 kg, SpO2 95 %.   Intake/Output Summary (Last 24 hours) at 02/14/2022 1026 Last data filed at 02/14/2022 0758 Gross per 24 hour  Intake 200 ml  Output 3550 ml  Net -3350 ml    Exam Gen:- Awake Alert, no acute distress  HEENT:- [place].AT, No sclera icterus Neck-Supple Neck,No JVD,.  Lungs-  CTAB , good air movement bilaterally CV- S1, S2 normal, regular Abd-  +ve B.Sounds, Abd Soft, No tenderness,    Extremity/Skin:- No  edema,   good pulses Psych-affect is appropriate, oriented x3 Neuro-no new focal deficits, no tremors    Data Review   CBC w Diff:  Lab Results  Component Value Date   WBC 15.6 (H) 02/14/2022   HGB 13.3 02/14/2022   HGB 13.8 08/03/2018   HCT 39.4 02/14/2022   HCT 40.1 08/03/2018   PLT 218 02/14/2022   PLT 213 08/03/2018   LYMPHOPCT 18 02/12/2022   BANDSPCT 0 10/27/2013   MONOPCT 5 02/12/2022   EOSPCT 0 02/12/2022   BASOPCT 0 02/12/2022    CMP:  Lab Results  Component Value Date   NA 136 02/13/2022   NA 141 08/03/2018   K 3.2 (L) 02/13/2022   CL 102 02/13/2022   CO2 24 02/13/2022   BUN 31 (H)  02/13/2022   BUN 24 (H) 08/03/2018   CREATININE 0.97 02/13/2022   PROT 5.7 (L) 02/12/2022   PROT 5.9 (L) 08/03/2018   ALBUMIN 3.1 (L) 02/12/2022   ALBUMIN 3.9 (L) 08/03/2018   BILITOT 2.1 (H) 02/12/2022   BILITOT 0.3 08/03/2018   ALKPHOS 103 02/12/2022   AST 47 (H) 02/12/2022   ALT 19 02/12/2022  .  Total Discharge time is about 33 minutes  Roxan Hockey M.D on 02/14/2022 at 10:26 AM  Go to [email or web address] -  for contact info  [facility] - Office  [phone]

## 2022-02-14 NOTE — Progress Notes (Signed)
OT Cancellation Note  Patient Details Name: Pedro [name] MRN: [id number] DOB: May 17, 1995   Cancelled Treatment:    Reason Eval/Treat Not Completed: OT screened, no needs identified, will sign off. Per RN and NT, pt has been up and ambulating with no assist. Pt has no further deficits noted at this time and OT will sign off.   Dasja Brase Elane Zafar Debrosse 02/14/2022, 10:25 AM

## 2022-02-14 NOTE — Discharge Instructions (Signed)
1)Take Insulin and other medications as prescribed 2)Follow-up with PCP in 1 to 2 weeks for recheck and reevaluation 3)Complete Abstinence from Tobacco advised 4)Compliance with Diabetic diet and avoidance of sugars and sweets advised

## 2022-02-14 NOTE — Progress Notes (Signed)
PT Cancellation Note  Patient Details Name: Pedro [name] MRN: [id number] DOB: January 06, 1996   Cancelled Treatment:    Reason Eval/Treat Not Completed: PT screened, no needs identified, will sign off   11:53 AM, 02/14/22 [name], MPT Physical Therapist with [facility] [phone] office [phone] mobile phone

## 2022-02-15 LAB — CBC
HCT: 36.5 % — ABNORMAL LOW (ref 39.0–52.0)
Hemoglobin: 12.5 g/dL — ABNORMAL LOW (ref 13.0–17.0)
MCH: 29.3 pg (ref 26.0–34.0)
MCHC: 34.2 g/dL (ref 30.0–36.0)
MCV: 85.7 fL (ref 80.0–100.0)
Platelets: 232 10*3/uL (ref 150–400)
RBC: 4.26 MIL/uL (ref 4.22–5.81)
RDW: 13 % (ref 11.5–15.5)
WBC: 18.6 10*3/uL — ABNORMAL HIGH (ref 4.0–10.5)
nRBC: 0 % (ref 0.0–0.2)

## 2022-02-17 LAB — CULTURE, BLOOD (ROUTINE X 2)
Culture: NO GROWTH
Culture: NO GROWTH

## 2022-03-15 ENCOUNTER — Ambulatory Visit (INDEPENDENT_AMBULATORY_CARE_PROVIDER_SITE_OTHER)

## 2022-03-15 ENCOUNTER — Encounter: Payer: Self-pay | Admitting: Orthopaedic Surgery

## 2022-03-15 ENCOUNTER — Ambulatory Visit (INDEPENDENT_AMBULATORY_CARE_PROVIDER_SITE_OTHER): Admitting: Orthopaedic Surgery

## 2022-03-15 DIAGNOSIS — M25561 Pain in right knee: Secondary | ICD-10-CM

## 2022-03-15 NOTE — Progress Notes (Signed)
Office Visit Note   Patient: Pedro [name]           Date of Birth: 1995/10/31           MRN: [id number] Visit Date: 03/15/2022              Requested by: Celene Squibb, MD [place],  [place] [address] PCP: Celene Squibb, MD   Assessment & Plan: Visit Diagnoses:  1. Acute pain of right knee     Plan: Impression is right knee contusion.  Continue symptomatic treatment.  Work note without restriction provided today.  Follow-up as needed.  Follow-Up Instructions: No follow-ups on file.   Orders:  Orders Placed This Encounter  Procedures   XR KNEE 3 VIEW RIGHT   No orders of the defined types were placed in this encounter.     Procedures: No procedures performed   Clinical Data: No additional findings.   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 claim for right knee pain status post fall from ladder about 7 to 8 feet high.  This happened a month ago.  Landed on the right side.  Currently out of work due to restrictions.  Reports 6 out of 10 pain.  He has been taking ibuprofen.  Has pain throughout the knee and the lower extremity. Review of Systems  Constitutional: Negative.   HENT: Negative.    Eyes: Negative.   Respiratory: Negative.    Cardiovascular: Negative.   Gastrointestinal: Negative.   Endocrine: Negative.   Genitourinary: Negative.   Skin: Negative.   Allergic/Immunologic: Negative.   Neurological: Negative.   Hematological: Negative.   Psychiatric/Behavioral: Negative.    All other systems reviewed and are negative.    Objective: Vital Signs: There were no vitals taken for this visit.  Physical Exam Vitals and nursing note reviewed.  Constitutional:      Appearance: He is well-developed.  HENT:     Head: Normocephalic and atraumatic.  Eyes:     Pupils: Pupils are equal, round, and reactive to light.  Pulmonary:     Effort:  Pulmonary effort is normal.  Abdominal:     Palpations: Abdomen is soft.  Musculoskeletal:        General: Normal range of motion.     Cervical back: Neck supple.  Skin:    General: Skin is warm.  Neurological:     Mental Status: He is alert and oriented to person, place, and time.  Psychiatric:        Behavior: Behavior normal.        Thought Content: Thought content normal.        Judgment: Judgment normal.     Ortho Exam Examination of the right knee shows normal range of motion.  Collaterals and cruciates are stable.  No joint line tenderness.  No joint effusion.  He can weight-bear and ambulate without any difficulty. Specialty Comments:  No specialty comments available.  Imaging: XR KNEE 3 VIEW RIGHT  Result Date: 03/15/2022 No acute or structural abnormalities    PMFS History: Patient Active Problem List   Diagnosis Date Noted   Hypokalemia 02/13/2022    Class: Acute   Hypomagnesemia 02/13/2022    Class: Acute   Sepsis ([place]) 02/12/2022    Class: Acute   SIRS (systemic inflammatory response syndrome) ([place]) 12/25/2021   Acute kidney injury ([place]) 08/19/2021  Elevated hemoglobin (HCC) 08/19/2021   Elevated hematocrit 08/19/2021   Hyponatremia 07/01/2021   Chest tube in place    Hyperglycemia 12/06/2020   Primary spontaneous pneumothorax 12/05/2020   DKA (diabetic ketoacidosis) ([place]) 11/09/2016   DM type 1 (diabetes mellitus, type 1) ([place]) 07/01/2016   Noncompliance with medications 07/01/2016   Polysubstance abuse ([place])    Diabetic neuropathy ([place]) 03/19/2014   Dehydration    Gastroenteritis 02/16/2014   Diabetic ketoacidosis without coma associated with type 1 diabetes mellitus (HCC)    Genital herpes simplex type 2 12/15/2013   Tobacco abuse 12/15/2013   DKA, type 1 ([place]) 12/15/2013   Diabetic ketoacidosis ([place]) 10/05/2013   Hyperkalemia 10/15/2012   Leukocytosis 10/15/2012   Noncompliance 10/15/2012   Polysubstance dependence ([place]) 07/22/2011     Class: Acute   Substance induced mood disorder ([place]) 07/22/2011    Class: Acute   Past Medical History:  Diagnosis Date   Acute kidney injury ([place]) 08/19/2021   ADHD (attention deficit hyperactivity disorder)    AKI (acute kidney injury) ([place]) 10/17/2014   Diabetic ketoacidosis ([place]) 10/05/2013   DKA, type 1 ([place]) 12/15/2013   DM type 1 (diabetes mellitus, type 1) ([place])    diagnosed age 27   Gastroenteritis 02/16/2014   Genital herpes simplex type 2 12/15/2013   Herpes    High cholesterol    Hyperkalemia 10/15/2012   Hypertension    Noncompliance 10/15/2012   Noncompliance with medications    Polysubstance dependence ([place]) 07/22/2011   SIRS (systemic inflammatory response syndrome) ([place]) 12/25/2021   Substance induced mood disorder ([place]) 07/22/2011   Thrombocytosis 08/19/2021   Tobacco abuse 12/15/2013    Family History  Problem Relation Age of Onset   Diabetes Mellitus I Paternal Grandmother    Diabetes Mellitus I Paternal Aunt    Anxiety disorder Mother    Hypertension Mother    Hypertension Father     Past Surgical History:  Procedure Laterality Date   INTERCOSTAL NERVE BLOCK Right 12/10/2020   Procedure: INTERCOSTAL NERVE BLOCK;  Surgeon: Lajuana Matte, MD;  Location: [facility];  Service: Thoracic;  Laterality: Right;   none     PLEURADESIS Right 12/10/2020   Procedure: MECHANICAL PLEURADESIS;  Surgeon: Lajuana Matte, MD;  Location: [facility];  Service: Thoracic;  Laterality: Right;   PLEURECTOMY Right 12/10/2020   Procedure: APICAL PLEURECTOMY;  Surgeon: Lajuana Matte, MD;  [facility];  Service: Thoracic;  Laterality: Right;   Social History   Occupational History   Not on file  Tobacco Use   Smoking status: Every Day    Packs/day: 1.00    Years: 5.00    Total pack years: 5.00    Types: Cigarettes   Smokeless tobacco: Never  Vaping Use   Vaping Use: Every day  Substance and Sexual Activity   Alcohol use: No   Drug use: Not Currently     Types: Marijuana, Other-see comments, Benzodiazepines   Sexual activity: Yes    Birth control/protection: Condom

## 2022-06-20 ENCOUNTER — Emergency Department (HOSPITAL_COMMUNITY)
Admission: EM | Admit: 2022-06-20 | Discharge: 2022-06-20 | Discharge status: Against Medical Advice | Payer: Managed Care, Other (non HMO) | Attending: Emergency Medicine | Admitting: Emergency Medicine

## 2022-06-20 ENCOUNTER — Other Ambulatory Visit: Payer: Self-pay

## 2022-06-20 ENCOUNTER — Encounter (HOSPITAL_COMMUNITY): Payer: Self-pay | Admitting: Emergency Medicine

## 2022-06-20 DIAGNOSIS — E1165 Type 2 diabetes mellitus with hyperglycemia: Secondary | ICD-10-CM | POA: Insufficient documentation

## 2022-06-20 DIAGNOSIS — R531 Weakness: Secondary | ICD-10-CM | POA: Diagnosis present

## 2022-06-20 DIAGNOSIS — R0789 Other chest pain: Secondary | ICD-10-CM | POA: Diagnosis not present

## 2022-06-20 DIAGNOSIS — Z5321 Procedure and treatment not carried out due to patient leaving prior to being seen by health care provider: Secondary | ICD-10-CM | POA: Diagnosis not present

## 2022-06-20 LAB — URINALYSIS, ROUTINE W REFLEX MICROSCOPIC
Bacteria, UA: NONE SEEN
Bilirubin Urine: NEGATIVE
Glucose, UA: >=500 mg/dL — AB
Hgb urine dipstick: NEGATIVE
Ketones, ur: NEGATIVE mg/dL
Leukocytes,Ua: NEGATIVE
Nitrite: NEGATIVE
Protein, ur: NEGATIVE mg/dL
Specific Gravity, Urine: 1.027 (ref 1.005–1.030)
pH: 6 (ref 5.0–8.0)

## 2022-06-20 LAB — CBC
HCT: 44 % (ref 39.0–52.0)
Hemoglobin: 14.2 g/dL (ref 13.0–17.0)
MCH: 30 pg (ref 26.0–34.0)
MCHC: 32.3 g/dL (ref 30.0–36.0)
MCV: 92.8 fL (ref 80.0–100.0)
Platelets: 264 10*3/uL (ref 150–400)
RBC: 4.74 MIL/uL (ref 4.22–5.81)
RDW: 12.4 % (ref 11.5–15.5)
WBC: 9.9 10*3/uL (ref 4.0–10.5)
nRBC: 0 % (ref 0.0–0.2)

## 2022-06-20 LAB — CBG MONITORING, ED: Glucose-Capillary: 482 mg/dL — ABNORMAL HIGH (ref 70–99)

## 2022-06-20 LAB — BASIC METABOLIC PANEL
Anion gap: 7 (ref 5–15)
BUN: 31 mg/dL — ABNORMAL HIGH (ref 6–20)
CO2: 24 mmol/L (ref 22–32)
Calcium: 8.9 mg/dL (ref 8.9–10.3)
Chloride: 99 mmol/L (ref 98–111)
Creatinine, Ser: 1.24 mg/dL (ref 0.61–1.24)
GFR, Estimated: >60 mL/min (ref >60)
Glucose, Bld: 466 mg/dL — ABNORMAL HIGH (ref 70–99)
Potassium: 4.5 mmol/L (ref 3.5–5.1)
Sodium: 130 mmol/L — ABNORMAL LOW (ref 135–145)

## 2022-06-20 NOTE — ED Triage Notes (Signed)
Pt c/o sugar being high. Hx of DM and DKA. [facility]en at [facility] the other day and sugar over 200. Pt c/o gen weakness. Today has had urinary freq. Color wnl. Nad in triage. Ambulatory with steady gait. Pt c/o burning in upper mid chest this am, states he had hx of GERD. Non diaphoretic.

## 2022-06-20 NOTE — ED Notes (Addendum)
Edp given EKG and aware of bs of 482. Pt states has not taken his insulin this am. Pt given water to drink while waiting

## 2023-03-28 ENCOUNTER — Other Ambulatory Visit: Payer: Self-pay

## 2023-03-28 ENCOUNTER — Encounter (HOSPITAL_COMMUNITY): Payer: Self-pay | Admitting: Emergency Medicine

## 2023-03-28 ENCOUNTER — Emergency Department (HOSPITAL_COMMUNITY)
Admission: EM | Admit: 2023-03-28 | Discharge: 2023-03-28 | Disposition: A | Discharge status: Home / Self Care | Payer: Managed Care, Other (non HMO) | Attending: Emergency Medicine | Admitting: Emergency Medicine

## 2023-03-28 DIAGNOSIS — F17210 Nicotine dependence, cigarettes, uncomplicated: Secondary | ICD-10-CM | POA: Insufficient documentation

## 2023-03-28 DIAGNOSIS — E10649 Type 1 diabetes mellitus with hypoglycemia without coma: Secondary | ICD-10-CM | POA: Insufficient documentation

## 2023-03-28 DIAGNOSIS — E162 Hypoglycemia, unspecified: Secondary | ICD-10-CM

## 2023-03-28 DIAGNOSIS — Z794 Long term (current) use of insulin: Secondary | ICD-10-CM | POA: Insufficient documentation

## 2023-03-28 DIAGNOSIS — E11649 Type 2 diabetes mellitus with hypoglycemia without coma: Secondary | ICD-10-CM | POA: Diagnosis not present

## 2023-03-28 LAB — CBC WITH DIFFERENTIAL/PLATELET
Abs Immature Granulocytes: 0.03 10*3/uL (ref 0.00–0.07)
Basophils Absolute: 0.1 10*3/uL (ref 0.0–0.1)
Basophils Relative: 1 %
Eosinophils Absolute: 0.3 10*3/uL (ref 0.0–0.5)
Eosinophils Relative: 3 %
HCT: 50.3 % (ref 39.0–52.0)
Hemoglobin: 16.7 g/dL (ref 13.0–17.0)
Immature Granulocytes: 0 %
Lymphocytes Relative: 21 %
Lymphs Abs: 2.2 10*3/uL (ref 0.7–4.0)
MCH: 30.1 pg (ref 26.0–34.0)
MCHC: 33.2 g/dL (ref 30.0–36.0)
MCV: 90.6 fL (ref 80.0–100.0)
Monocytes Absolute: 0.7 10*3/uL (ref 0.1–1.0)
Monocytes Relative: 6 %
Neutro Abs: 7.2 10*3/uL (ref 1.7–7.7)
Neutrophils Relative %: 69 %
Platelets: 287 10*3/uL (ref 150–400)
RBC: 5.55 MIL/uL (ref 4.22–5.81)
RDW: 13.2 % (ref 11.5–15.5)
WBC: 10.3 10*3/uL (ref 4.0–10.5)
nRBC: 0 % (ref 0.0–0.2)

## 2023-03-28 LAB — COMPREHENSIVE METABOLIC PANEL
ALT: 16 U/L (ref 0–44)
AST: 19 U/L (ref 15–41)
Albumin: 3.9 g/dL (ref 3.5–5.0)
Alkaline Phosphatase: 68 U/L (ref 38–126)
Anion gap: 11 (ref 5–15)
BUN: 27 mg/dL — ABNORMAL HIGH (ref 6–20)
CO2: 23 mmol/L (ref 22–32)
Calcium: 9.5 mg/dL (ref 8.9–10.3)
Chloride: 107 mmol/L (ref 98–111)
Creatinine, Ser: 1.17 mg/dL (ref 0.61–1.24)
GFR, Estimated: >60 mL/min (ref >60)
Glucose, Bld: 39 mg/dL — CL (ref 70–99)
Potassium: 3.9 mmol/L (ref 3.5–5.1)
Sodium: 141 mmol/L (ref 135–145)
Total Bilirubin: 1 mg/dL (ref 0.0–1.2)
Total Protein: 6.9 g/dL (ref 6.5–8.1)

## 2023-03-28 LAB — CBG MONITORING, ED
Glucose-Capillary: 152 mg/dL — ABNORMAL HIGH (ref 70–99)
Glucose-Capillary: 36 mg/dL — CL (ref 70–99)
Glucose-Capillary: 115 mg/dL — ABNORMAL HIGH (ref 70–99)
Glucose-Capillary: 43 mg/dL — CL (ref 70–99)
Glucose-Capillary: 150 mg/dL — ABNORMAL HIGH (ref 70–99)
Glucose-Capillary: 198 mg/dL — ABNORMAL HIGH (ref 70–99)

## 2023-03-28 LAB — ETHANOL: Alcohol, Ethyl (B): <10 mg/dL (ref <10)

## 2023-03-28 MED ORDER — LORAZEPAM 2 MG/ML IJ SOLN: 4.0000 mg | INTRAMUSCULAR | Status: DC | PRN

## 2023-03-28 MED ORDER — DEXTROSE 50 % IV SOLN
50.0000 mL | Freq: Once | INTRAVENOUS | Status: AC
  Administered 2023-03-28: 50 mL via INTRAVENOUS

## 2023-03-28 MED ORDER — DEXTROSE 50 % IV SOLN
INTRAVENOUS | Status: AC
  Filled 2023-03-28: qty 50

## 2023-03-28 NOTE — Discharge Instructions (Signed)
As discussed, continue regular meals along with your insulin to decrease likelihood of low blood sugar events.  Continue to monitor blood sugar at home.  Follow-up with your primary care for reassessment.  Please not hesitate to return if the worrisome signs and symptoms we discussed become apparent.

## 2023-03-28 NOTE — ED Notes (Signed)
Patient last CBG 198 at 15:30

## 2023-03-28 NOTE — ED Triage Notes (Signed)
Pt brought in by EMS. Pt had a witnessed seizure at work, unsure of how long. According to EMS, patient's seizure was full tonic clonic.  Pt has hx of seizures but doesn't take medicine. Patient states seizures normally happen to him when his blood sugar is too low, blood sugar in the field 86.

## 2023-03-28 NOTE — ED Notes (Signed)
Dietary called for a meal stat.

## 2023-03-28 NOTE — ED Notes (Signed)
Pt became very agitated with nurse during triage, the pt was cursing at staff, non compliant. This NT told pt that he needed to calm down and this staff would not allow him to yell and curse at [place]. Pt still non compliant and security is with patient now.

## 2023-03-28 NOTE — ED Provider Notes (Signed)
[place] EMERGENCY DEPARTMENT AT [facility] Provider Note   CSN: [id number] Arrival date & time: 03/28/23  5621     History  Chief Complaint  Patient presents with   Seizures    Pedro [name] is a 28 y.o. male.   Seizures   28 year old male presents emergency department with complaints of low blood sugar.  Patient states that he was at work this morning and began to feel faint.  Felt like he was "going in and out of it."  States he felt like his sugar was low when he went up to the office at his jobsite.  States that episode lasted for 30 minutes or so before returning to baseline.  Patient states he has had history of seizures around a year ago is currently on no antiepileptic medication.  Denies seizure-like activity today.  Denies any tongue biting, bowel/bladder dysfunction, postictal phase.  States that he feels like it was related to his sugar being low.  Reports history of diabetes mellitus type 1 with continued use of his at home insulin.  States that he had nothing to eat or drink today which he feels like caused his blood sugar to be low.  Denies any trauma from the incident.  States that he was sat down in a chair in the office at his work.  States that he now feels back at baseline.  Per EMS, CBG 86.  Denies any substance use.  Past medical history significant for DKA type I, polysubstance abuse, medical noncompliance, hypertension, hypercholesterolemia, spontaneous pneumothorax  Home Medications Prior to Admission medications   Medication Sig Start Date End Date Taking? Authorizing Provider  ELDERBERRY PO Take 1 tablet by mouth daily as needed (immune support).   Yes [provider]  gabapentin (NEURONTIN) 300 MG capsule Take 1 capsule (300 mg total) by mouth at bedtime. 07/01/21  Yes Johnson, Clanford L, MD  insulin NPH-regular Human (70-30) 100 UNIT/ML injection Inject 10 Units into the skin 2 (two) times daily with a meal. 02/14/22  Yes Emokpae,  Courage, MD  lamoTRIgine (LAMICTAL) 25 MG tablet Take 1 tablet (25 mg total) by mouth 2 (two) times daily. Patient not taking: Reported on 03/28/2023 02/14/22   Shon Hale, MD  losartan (COZAAR) 50 MG tablet Take 1 tablet (50 mg total) by mouth daily. 02/14/22 02/14/23  Shon Hale, MD  nicotine (NICODERM CQ - DOSED IN MG/24 HOURS) 14 mg/24hr patch Place 1 patch (14 mg total) onto the skin daily. Patient not taking: Reported on 03/28/2023 02/15/22   Shon Hale, MD      Allergies    Sulfa antibiotics    Review of Systems   Review of Systems  Neurological:  Positive for seizures.  All other systems reviewed and are negative.   Physical Exam Updated Vital Signs BP 123/81   Pulse 91   Temp 98.4 F (36.9 C) (Oral)   Resp 16   SpO2 99%  Physical Exam Vitals and nursing note reviewed.  Constitutional:      General: He is not in acute distress.    Appearance: He is well-developed.  HENT:     Head: Normocephalic and atraumatic.  Eyes:     Conjunctiva/sclera: Conjunctivae normal.  Cardiovascular:     Rate and Rhythm: Normal rate and regular rhythm.     Heart sounds: No murmur heard. Pulmonary:     Effort: Pulmonary effort is normal. No respiratory distress.     Breath sounds: Normal breath sounds.  Abdominal:  Palpations: Abdomen is soft.     Tenderness: There is no abdominal tenderness.  Musculoskeletal:        General: No swelling.     Cervical back: Neck supple.  Skin:    General: Skin is warm and dry.     Capillary Refill: Capillary refill takes less than 2 seconds.  Neurological:     Mental Status: He is alert.     Comments: Alert and oriented to self, place, time and event.   Speech is fluent, clear without dysarthria or dysphasia.   Strength 5/5 in upper/lower extremities   Sensation intact in upper/lower extremities   Normal gait.  CN I not tested  CN II not tested CN III, IV, VI PERRLA and EOMs intact bilaterally  CN V Intact sensation to  sharp and light touch to the face  CN VII facial movements symmetric  CN VIII not tested  CN IX, X no uvula deviation, symmetric rise of soft palate  CN XI 5/5 SCM and trapezius strength bilaterally  CN XII Midline tongue protrusion, symmetric L/R movements     Psychiatric:        Mood and Affect: Mood normal. Affect is blunt.     ED Results / Procedures / Treatments   Labs (all labs ordered are listed, but only abnormal results are displayed) Labs Reviewed  COMPREHENSIVE METABOLIC PANEL - Abnormal; Notable for the following components:      Result Value   Glucose, Bld 39 (*)    BUN 27 (*)    All other components within normal limits  CBG MONITORING, ED - Abnormal; Notable for the following components:   Glucose-Capillary 36 (*)    All other components within normal limits  CBG MONITORING, ED - Abnormal; Notable for the following components:   Glucose-Capillary 152 (*)    All other components within normal limits  CBG MONITORING, ED - Abnormal; Notable for the following components:   Glucose-Capillary 115 (*)    All other components within normal limits  CBG MONITORING, ED - Abnormal; Notable for the following components:   Glucose-Capillary 43 (*)    All other components within normal limits  CBG MONITORING, ED - Abnormal; Notable for the following components:   Glucose-Capillary 150 (*)    All other components within normal limits  CBC WITH DIFFERENTIAL/PLATELET  ETHANOL  RAPID URINE DRUG SCREEN, HOSP PERFORMED  CBG MONITORING, ED  CBG MONITORING, ED    EKG None  Radiology No results found.  Procedures Procedures    Medications Ordered in ED Medications  dextrose 50 % solution 50 mL (50 mLs Intravenous Given 03/28/23 0865)    ED Course/ Medical Decision Making/ A&P Clinical Course as of 03/28/23 1555  Tue Mar 28, 2023  0958 Per nursing staff, CBG in the 30s, will administer 1 amp of D50 [CR]  1059 Reevaluation of the patient showed subjective  improvement of symptoms after D5 was administered.  Patient able to tolerate oral orange juice.  Will continue to monitor patient CBG [CR]    Clinical Course User Index [CR] Peter Garter, PA                                 Medical Decision Making Amount and/or Complexity of Data Reviewed Labs: ordered.  Risk Prescription drug management.   This patient presents to the ED for concern of varying levels of consciousness, this involves an extensive number of treatment  options, and is a complaint that carries with it a high risk of complications and morbidity.  The differential diagnosis includes hypoglycemia, substance use, metabolic derangement, dehydration, DKA/HHS, hypoglycemia, seizure, vasovagal, orthostatic hypotension, structural cardiac abnormality, arrhythmia, other   Co morbidities that complicate the patient evaluation  See HPI   Additional history obtained:  Additional history obtained from EMR External records from outside source obtained and reviewed including see above   Lab Tests:  I Ordered, and personally interpreted labs.  The pertinent results include: No leukocytosis.  No evidence of anemia.  Platelets within range.  No electrolyte abnormalities.  BUN elevation of 27, creatinine 1.17.  No transaminitis.  Ethanol negative.  Initial CBG of 36 with elevation in the 150s after D5 was administered, 115 and then down to 43 before patient had something substantive to eat.  CBGs subsequently improved to 150 and then 180.   Imaging Studies ordered:  N/a   Cardiac Monitoring: / EKG:  The patient was maintained on a cardiac monitor.  I personally viewed and interpreted the cardiac monitored which showed an underlying rhythm of: Sinus rhythm   Consultations Obtained:  I requested consultation with attending physician Dr. Suezanne Jacquet it is in agreement treatment plan going forward  Problem List / ED Course / Critical interventions / Medication  management  Hypoglycemia I ordered medication including D5  Reevaluation of the patient after these medicines showed that the patient improved I have reviewed the patients home medicines and have made adjustments as needed   Social Determinants of Health:  Polysubstance use.  Chronic cigarette use.   Test / Admission - Considered:  Hypoglycemia Vitals signs within normal range and stable throughout visit. Laboratory/imaging studies significant for: See above 27 year old male presents emergency department with complaints of low blood sugar.  States that he began to feel faint while at work "going in and out of it".  Had similar occurrences in the past related to his blood sugar being low.  No real postictal phase, bowel/bladder incontinence, tongue biting.  Patient with history of seizure not on antiepileptic medications and is adamant that he did not have a seizure.  Physical exam overall unremarkable.  Patient with initial CBG in the 30s which improved with D5 before subsequently dropping again in the 40s prior to patient having a substantive meal to eat.  At that time, discussion was had regarding beginning IV dextrose and admission but patient adamantly declined.  Shared decision made conversation was had with patient regarding continue to monitor patient's blood glucose with continued p.o. challenge over the next 3 and half to 4 hours of which he subsequently obliged.  Patient's blood glucose improved gradually over the next 3-1/2 hours to 150 and then 183 and half hours after most recent hypoglycemic event.  Patient overall well-appearing.  Suspect patient symptoms likely secondary to hypoglycemia due to not eating or drinking anything since around 5 PM yesterday with continued use of his insulin at home.  Will recommend continued monitoring of patient's blood glucose, regular meals and follow-up with primary care.  Treatment plan discussed at length with patient and he acknowledged  understanding was agreeable to said plan.  Patient overall well-appearing, afebrile in no acute distress upon discharge.. Worrisome signs and symptoms were discussed with the patient, and the patient acknowledged understanding to return to the ED if noticed. Patient was stable upon discharge.          Final Clinical Impression(s) / ED Diagnoses Final diagnoses:  Hypoglycemia    Rx /  DC Orders ED Discharge Orders     None         Peter Garter, [place] 03/28/23 1555    [name], MD 03/29/23 [phone]

## 2023-03-28 NOTE — ED Notes (Signed)
Pt CBG checked and it resulted at 36, Pt given 2 8 oz Orange Juices and an amp of d50.

## 2023-04-08 DIAGNOSIS — Z419 Encounter for procedure for purposes other than remedying health state, unspecified: Secondary | ICD-10-CM | POA: Diagnosis not present

## 2023-05-06 DIAGNOSIS — Z419 Encounter for procedure for purposes other than remedying health state, unspecified: Secondary | ICD-10-CM | POA: Diagnosis not present

## 2023-06-17 DIAGNOSIS — Z419 Encounter for procedure for purposes other than remedying health state, unspecified: Secondary | ICD-10-CM | POA: Diagnosis not present

## 2023-06-26 DIAGNOSIS — E109 Type 1 diabetes mellitus without complications: Secondary | ICD-10-CM | POA: Diagnosis not present

## 2023-06-26 DIAGNOSIS — I10 Essential (primary) hypertension: Secondary | ICD-10-CM | POA: Diagnosis not present

## 2023-06-30 DIAGNOSIS — D72829 Elevated white blood cell count, unspecified: Secondary | ICD-10-CM | POA: Diagnosis not present

## 2023-06-30 DIAGNOSIS — M545 Low back pain, unspecified: Secondary | ICD-10-CM | POA: Diagnosis not present

## 2023-06-30 DIAGNOSIS — E782 Mixed hyperlipidemia: Secondary | ICD-10-CM | POA: Diagnosis not present

## 2023-06-30 DIAGNOSIS — E109 Type 1 diabetes mellitus without complications: Secondary | ICD-10-CM | POA: Diagnosis not present

## 2023-06-30 DIAGNOSIS — N179 Acute kidney failure, unspecified: Secondary | ICD-10-CM | POA: Diagnosis not present

## 2023-06-30 DIAGNOSIS — R809 Proteinuria, unspecified: Secondary | ICD-10-CM | POA: Diagnosis not present

## 2023-06-30 DIAGNOSIS — E1040 Type 1 diabetes mellitus with diabetic neuropathy, unspecified: Secondary | ICD-10-CM | POA: Diagnosis not present

## 2023-06-30 DIAGNOSIS — I10 Essential (primary) hypertension: Secondary | ICD-10-CM | POA: Diagnosis not present

## 2023-06-30 DIAGNOSIS — F319 Bipolar disorder, unspecified: Secondary | ICD-10-CM | POA: Diagnosis not present

## 2023-07-04 ENCOUNTER — Ambulatory Visit
Admission: EM | Admit: 2023-07-04 | Discharge: 2023-07-04 | Disposition: A | Discharge status: Home / Self Care | Attending: Nurse Practitioner | Admitting: Nurse Practitioner

## 2023-07-04 ENCOUNTER — Ambulatory Visit (INDEPENDENT_AMBULATORY_CARE_PROVIDER_SITE_OTHER)

## 2023-07-04 ENCOUNTER — Telehealth: Payer: Self-pay

## 2023-07-04 DIAGNOSIS — L03114 Cellulitis of left upper limb: Secondary | ICD-10-CM

## 2023-07-04 MED ORDER — KETOROLAC TROMETHAMINE 30 MG/ML IJ SOLN
30.0000 mg | Freq: Once | INTRAMUSCULAR | Status: AC
  Administered 2023-07-04: 30 mg via INTRAMUSCULAR

## 2023-07-04 MED ORDER — DOXYCYCLINE HYCLATE 100 MG PO CAPS: 100.0000 mg | ORAL_CAPSULE | Freq: Two times a day (BID) | ORAL | 0 refills | Status: AC

## 2023-07-04 MED ORDER — CEFTRIAXONE SODIUM 1 G IJ SOLR
1.0000 g | Freq: Once | INTRAMUSCULAR | Status: AC
  Administered 2023-07-04: 1 g via INTRAMUSCULAR

## 2023-07-04 NOTE — Discharge Instructions (Signed)
 I will contact you with the xray results if they are abnormal.  I am concerned you have a deep soft tissue infection in your skin.  We have give you a shot of antibiotic medicine and Toradol  anti-inflammatory medicine to help with pain today.  Start taking the oral doxycycline additionally.  If your symptoms do not improve in the next 24 hours or if symptoms worsen at all, please seek care in the emergency room.

## 2023-07-04 NOTE — ED Triage Notes (Addendum)
 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 Pt states his whole hand is hurting including his knuckles. States he popped the scab on it and the pain increased after the puss released.  Pt states "it was healing"  Last dose of tylenol  was 12pm   Last tetanus unknown

## 2023-07-04 NOTE — Telephone Encounter (Signed)
 Called patient to relay a message about his x ray results but no answer after 2 attempts.   Per provider, hand xray looks stable; there are 2 small metal foreign bodies between the first and second digits but not over the area of concern where he was having drainage from today.  No change to treatment plan at this time. It is important that patient goes to the ER for evaluation if symptoms do not improve significantly in next 24 hours.

## 2023-07-04 NOTE — ED Notes (Signed)
 HR obtained in triage but did not make it into chart. Pt left prior to re-obtaining value. NAD noted.

## 2023-07-04 NOTE — ED Provider Notes (Signed)
 [facility]    CSN: [id number] Arrival date & time: 07/04/23  1410      History   Chief Complaint No chief complaint on file.   HPI Pedro [name] is a 28 y.o. male.   Patient presents today for 1 week history of left hand pain.  Reports puncture wound approximately 1 week ago when he was drilling and the drill went through his glove, puncturing his palm on the left side.  He is having pain at the base of his first finger and reports the area is swollen.  He tells me there was a blister there this morning that he "popped" with a knife.  He is not having pain across all of his knuckles.  He denies fevers, nausea/vomiting, change in appetite.  He has been taking Tylenol  and ibuprofen  for the pain without improvement.  Last Tdap unknown.    Past Medical History:  Diagnosis Date   Acute kidney injury (HCC) 08/19/2021   ADHD (attention deficit hyperactivity disorder)    AKI (acute kidney injury) (HCC) 10/17/2014   Diabetic ketoacidosis (HCC) 10/05/2013   DKA, type 1 (HCC) 12/15/2013   DM type 1 (diabetes mellitus, type 1) (HCC)    diagnosed age 76   Gastroenteritis 02/16/2014   Genital herpes simplex type 2 12/15/2013   Herpes    High cholesterol    Hyperkalemia 10/15/2012   Hypertension    Noncompliance 10/15/2012   Noncompliance with medications    Polysubstance dependence (HCC) 07/22/2011   SIRS (systemic inflammatory response syndrome) (HCC) 12/25/2021   Substance induced mood disorder (HCC) 07/22/2011   Thrombocytosis 08/19/2021   Tobacco abuse 12/15/2013    Patient Active Problem List   Diagnosis Date Noted   Hypokalemia 02/13/2022    Class: Acute   Hypomagnesemia 02/13/2022    Class: Acute   Sepsis (HCC) 02/12/2022    Class: Acute   SIRS (systemic inflammatory response syndrome) (HCC) 12/25/2021   Acute kidney injury (HCC) 08/19/2021   Elevated hemoglobin (HCC) 08/19/2021   Elevated hematocrit 08/19/2021   Hyponatremia 07/01/2021   Chest  tube in place    Hyperglycemia 12/06/2020   Primary spontaneous pneumothorax 12/05/2020   DKA (diabetic ketoacidosis) (HCC) 11/09/2016   DM type 1 (diabetes mellitus, type 1) (HCC) 07/01/2016   Noncompliance with medications 07/01/2016   Polysubstance abuse (HCC)    Diabetic neuropathy (HCC) 03/19/2014   Dehydration    Gastroenteritis 02/16/2014   Diabetic ketoacidosis without coma associated with type 1 diabetes mellitus (HCC)    Genital herpes simplex type 2 12/15/2013   Tobacco abuse 12/15/2013   DKA, type 1 (HCC) 12/15/2013   Diabetic ketoacidosis (HCC) 10/05/2013   Hyperkalemia 10/15/2012   Leukocytosis 10/15/2012   Noncompliance 10/15/2012   Polysubstance dependence (HCC) 07/22/2011    Class: Acute   Substance induced mood disorder (HCC) 07/22/2011    Class: Acute    Past Surgical History:  Procedure Laterality Date   INTERCOSTAL NERVE BLOCK Right 12/10/2020   Procedure: INTERCOSTAL NERVE BLOCK;  Surgeon: Hilarie Lovely, MD;  Location: [facility] OR;  Service: Thoracic;  Laterality: Right;   none     PLEURADESIS Right 12/10/2020   Procedure: MECHANICAL PLEURADESIS;  Surgeon: Hilarie Lovely, MD;  Location: [facility] OR;  Service: Thoracic;  Laterality: Right;   PLEURECTOMY Right 12/10/2020   Procedure: APICAL PLEURECTOMY;  Surgeon: Hilarie Lovely, MD;  Location: [facility] OR;  Service: Thoracic;  Laterality: Right;       Home Medications    Prior  to Admission medications   Medication Sig Start Date End Date Taking? Authorizing Provider  doxycycline (VIBRAMYCIN) 100 MG capsule Take 1 capsule (100 mg total) by mouth 2 (two) times daily for 7 days. 07/04/23 07/11/23 Yes Thena Fireman A, NP  ELDERBERRY PO Take 1 tablet by mouth daily as needed (immune support).    [provider]  gabapentin  (NEURONTIN ) 300 MG capsule Take 1 capsule (300 mg total) by mouth at bedtime. 07/01/21   Rayfield Cairo, MD  insulin  NPH-regular Human (70-30) 100 UNIT/ML injection Inject 10  Units into the skin 2 (two) times daily with a meal. 02/14/22   Emokpae, Courage, MD  lamoTRIgine  (LAMICTAL ) 25 MG tablet Take 1 tablet (25 mg total) by mouth 2 (two) times daily. Patient not taking: Reported on 03/28/2023 02/14/22   Colin Dawley, MD  losartan  (COZAAR ) 50 MG tablet Take 1 tablet (50 mg total) by mouth daily. 02/14/22 02/14/23  Colin Dawley, MD  nicotine  (NICODERM CQ  - DOSED IN MG/24 HOURS) 14 mg/24hr patch Place 1 patch (14 mg total) onto the skin daily. Patient not taking: Reported on 03/28/2023 02/15/22   Colin Dawley, MD    Family History Family History  Problem Relation Age of Onset   Diabetes Mellitus I Paternal Grandmother    Diabetes Mellitus I Paternal Aunt    Anxiety disorder Mother    Hypertension Mother    Hypertension Father     Social History Social History   Tobacco Use   Smoking status: Every Day    Current packs/day: 1.00    Average packs/day: 1 pack/day for 5.0 years (5.0 ttl pk-yrs)    Types: Cigarettes   Smokeless tobacco: Never  Vaping Use   Vaping status: Every Day  Substance Use Topics   Alcohol  use: No   Drug use: Not Currently    Types: Marijuana, Other-see comments, Benzodiazepines     Allergies   Sulfa antibiotics   Review of Systems Review of Systems Per HPI  Physical Exam Triage Vital Signs ED Triage Vitals  Encounter Vitals Group     BP 07/04/23 1423 (!) 130/92     Systolic BP Percentile --      Diastolic BP Percentile --      Pulse --      Resp 07/04/23 1423 20     Temp 07/04/23 1423 98.1 F (36.7 C)     Temp Source 07/04/23 1423 Oral     SpO2 07/04/23 1423 98 %     Weight --      Height --      Head Circumference --      Peak Flow --      Pain Score 07/04/23 1422 10     Pain Loc --      Pain Education --      Exclude from Growth Chart --    No data found.  Updated Vital Signs BP (!) 130/92 (BP Location: Right Arm)   Temp 98.1 F (36.7 C) (Oral)   Resp 20   SpO2 98%   HR: 73 beats per  minute  Visual Acuity Right Eye Distance:   Left Eye Distance:   Bilateral Distance:    Right Eye Near:   Left Eye Near:    Bilateral Near:     Physical Exam Vitals and nursing note reviewed.  Constitutional:      General: He is not in acute distress.    Appearance: Normal appearance. He is not toxic-appearing.  HENT:  Mouth/Throat:     Mouth: Mucous membranes are moist.     Pharynx: Oropharynx is clear.  Cardiovascular:     Rate and Rhythm: Normal rate and regular rhythm.  Pulmonary:     Effort: Pulmonary effort is normal. No respiratory distress.  Musculoskeletal:       Hands:     Comments: Swelling and tenderness noted to left palmar aspect of hand in area marked, near base of second phalax.  There is a pinpoint pustule that is actively draining.  No discoloration or red streaking up the arm.  He is neurovascularly intact in the distal tip of the finger and has full sensation to the distal tip of the finger.  Range of motion of the hand is difficult to assess due to pain.  Skin:    General: Skin is warm and dry.     Capillary Refill: Capillary refill takes less than 2 seconds.     Coloration: Skin is not jaundiced or pale.     Findings: No erythema.  Neurological:     Mental Status: He is alert and oriented to person, place, and time.  Psychiatric:        Behavior: Behavior is cooperative.     Comments: Agitated throughout exam, threatened to leave urgent care multiple times      UC Treatments / Results  Labs (all labs ordered are listed, but only abnormal results are displayed) Labs Reviewed - No data to display  EKG   Radiology DG Hand Complete Left Result Date: 07/04/2023 CLINICAL DATA:  Puncture wound, left hand pain EXAM: LEFT HAND - COMPLETE 3+ VIEW COMPARISON:  None Available. FINDINGS: Frontal, oblique, and lateral views of the left hand are obtained. There are punctate metallic foreign bodies within the soft tissues between the first and second  digits, measuring up to 2 mm in size, of uncertain acuity. No fracture, subluxation, or dislocation. Remaining soft tissues are unremarkable. No evidence of subcutaneous gas. IMPRESSION: 1. Punctate metallic foreign bodies within the soft tissues between the first and second digits, of uncertain acuity. Please correlate with site of acute puncture wound. 2. No acute bony abnormalities. Electronically Signed   By: Bobbye Burrow M.D.   On: 07/04/2023 15:46    Procedures Procedures (including critical care time)  Medications Ordered in UC Medications  cefTRIAXone  (ROCEPHIN ) injection 1 g (1 g Intramuscular Given 07/04/23 1512)  ketorolac  (TORADOL ) 30 MG/ML injection 30 mg (30 mg Intramuscular Given 07/04/23 1512)    Initial Impression / Assessment and Plan / UC Course  I have reviewed the triage vital signs and the nursing notes.  Pertinent labs & imaging results that were available during my care of the patient were reviewed by me and considered in my medical decision making (see chart for details).  Patient is well-appearing, normotensive, afebrile, not tachycardic, not tachypneic, oxygenating well on room air.    1. Cellulitis of left hand Concern for cellulitis and possible deep tissue infection X-ray imaging obtained; there are 2 small foreign bodies upon my independent visualization, however these are not near the area of concern today and there does not appear to be any bone demineralization on the x-ray per my independent review Vitals are stable in triage; examination is concerning given significant pain with range of motion Patient increasingly agitated throughout visit; declines tetanus shot today, is agreeable to ceftriaxone  injection Additionally, will attempt to help with pain with injection of Toradol  and start oral doxycycline to cover for deep tissue infection Strict ER  precautions discussed with patient - if symptoms are not significantly improved in the next 24 hours; if  symptoms worsen in the meantime, patient is to be seen emergently  Update: xray imaging is negative for fractures/broken bones.  There is a foreign body under the skin but not in the area of concern.    The patient was given the opportunity to ask questions.  All questions answered to their satisfaction.  The patient is in agreement to this plan.   Final Clinical Impressions(s) / UC Diagnoses   Final diagnoses:  Cellulitis of left hand     Discharge Instructions      I will contact you with the xray results if they are abnormal.  I am concerned you have a deep soft tissue infection in your skin.  We have give you a shot of antibiotic medicine and Toradol  anti-inflammatory medicine to help with pain today.  Start taking the oral doxycycline additionally.  If your symptoms do not improve in the next 24 hours or if symptoms worsen at all, please seek care in the emergency room.    ED Prescriptions     Medication Sig Dispense Auth. Provider   doxycycline (VIBRAMYCIN) 100 MG capsule Take 1 capsule (100 mg total) by mouth 2 (two) times daily for 7 days. 14 capsule Wilhemena Harbour, NP      PDMP not reviewed this encounter.   Wilhemena Harbour, NP 07/04/23 [phone]

## 2023-07-07 NOTE — Telephone Encounter (Signed)
 TC to pt. Advised of results and NP recs. Denies questions. Verbalized understanding.

## 2023-07-17 DIAGNOSIS — Z419 Encounter for procedure for purposes other than remedying health state, unspecified: Secondary | ICD-10-CM | POA: Diagnosis not present

## 2023-08-10 ENCOUNTER — Emergency Department (HOSPITAL_COMMUNITY)
Admission: EM | Admit: 2023-08-10 | Discharge: 2023-08-10 | Disposition: A | Discharge status: Home / Self Care | Attending: Emergency Medicine | Admitting: Emergency Medicine

## 2023-08-10 ENCOUNTER — Other Ambulatory Visit: Payer: Self-pay

## 2023-08-10 DIAGNOSIS — Z5329 Procedure and treatment not carried out because of patient's decision for other reasons: Secondary | ICD-10-CM | POA: Diagnosis not present

## 2023-08-10 DIAGNOSIS — Z743 Need for continuous supervision: Secondary | ICD-10-CM | POA: Diagnosis not present

## 2023-08-10 DIAGNOSIS — E10649 Type 1 diabetes mellitus with hypoglycemia without coma: Secondary | ICD-10-CM | POA: Diagnosis not present

## 2023-08-10 DIAGNOSIS — E161 Other hypoglycemia: Secondary | ICD-10-CM | POA: Diagnosis not present

## 2023-08-10 DIAGNOSIS — E162 Hypoglycemia, unspecified: Secondary | ICD-10-CM | POA: Diagnosis present

## 2023-08-10 DIAGNOSIS — N179 Acute kidney failure, unspecified: Secondary | ICD-10-CM | POA: Insufficient documentation

## 2023-08-10 DIAGNOSIS — E11649 Type 2 diabetes mellitus with hypoglycemia without coma: Secondary | ICD-10-CM | POA: Diagnosis not present

## 2023-08-10 DIAGNOSIS — R000 Tachycardia, unspecified: Secondary | ICD-10-CM | POA: Insufficient documentation

## 2023-08-10 DIAGNOSIS — Z720 Tobacco use: Secondary | ICD-10-CM | POA: Diagnosis not present

## 2023-08-10 LAB — CBG MONITORING, ED
Glucose-Capillary: 34 mg/dL — CL (ref 70–99)
Glucose-Capillary: 81 mg/dL (ref 70–99)
Glucose-Capillary: 90 mg/dL (ref 70–99)
Glucose-Capillary: 48 mg/dL — ABNORMAL LOW (ref 70–99)
Glucose-Capillary: 77 mg/dL (ref 70–99)
Glucose-Capillary: 221 mg/dL — ABNORMAL HIGH (ref 70–99)
Glucose-Capillary: 196 mg/dL — ABNORMAL HIGH (ref 70–99)
Glucose-Capillary: 201 mg/dL — ABNORMAL HIGH (ref 70–99)
Glucose-Capillary: 198 mg/dL — ABNORMAL HIGH (ref 70–99)
Glucose-Capillary: 207 mg/dL — ABNORMAL HIGH (ref 70–99)
Glucose-Capillary: 160 mg/dL — ABNORMAL HIGH (ref 70–99)

## 2023-08-10 LAB — CBC WITH DIFFERENTIAL/PLATELET
Abs Immature Granulocytes: 0.03 10*3/uL (ref 0.00–0.07)
Basophils Absolute: 0 10*3/uL (ref 0.0–0.1)
Basophils Relative: 1 %
Eosinophils Absolute: 0.1 10*3/uL (ref 0.0–0.5)
Eosinophils Relative: 2 %
HCT: 40.5 % (ref 39.0–52.0)
Hemoglobin: 13.1 g/dL (ref 13.0–17.0)
Immature Granulocytes: 0 %
Lymphocytes Relative: 31 %
Lymphs Abs: 2.1 10*3/uL (ref 0.7–4.0)
MCH: 30.3 pg (ref 26.0–34.0)
MCHC: 32.3 g/dL (ref 30.0–36.0)
MCV: 93.8 fL (ref 80.0–100.0)
Monocytes Absolute: 0.4 10*3/uL (ref 0.1–1.0)
Monocytes Relative: 6 %
Neutro Abs: 4.1 10*3/uL (ref 1.7–7.7)
Neutrophils Relative %: 60 %
Platelets: 330 10*3/uL (ref 150–400)
RBC: 4.32 MIL/uL (ref 4.22–5.81)
RDW: 13.1 % (ref 11.5–15.5)
WBC: 6.8 10*3/uL (ref 4.0–10.5)
nRBC: 0 % (ref 0.0–0.2)

## 2023-08-10 LAB — COMPREHENSIVE METABOLIC PANEL WITH GFR
ALT: 33 U/L (ref 0–44)
AST: 31 U/L (ref 15–41)
Albumin: 3.2 g/dL — ABNORMAL LOW (ref 3.5–5.0)
Alkaline Phosphatase: 82 U/L (ref 38–126)
Anion gap: 9 (ref 5–15)
BUN: 27 mg/dL — ABNORMAL HIGH (ref 6–20)
CO2: 22 mmol/L (ref 22–32)
Calcium: 8.9 mg/dL (ref 8.9–10.3)
Chloride: 106 mmol/L (ref 98–111)
Creatinine, Ser: 1.36 mg/dL — ABNORMAL HIGH (ref 0.61–1.24)
GFR, Estimated: >60 mL/min (ref >60)
Glucose, Bld: 87 mg/dL (ref 70–99)
Potassium: 3.7 mmol/L (ref 3.5–5.1)
Sodium: 137 mmol/L (ref 135–145)
Total Bilirubin: 0.6 mg/dL (ref 0.0–1.2)
Total Protein: 6.5 g/dL (ref 6.5–8.1)

## 2023-08-10 MED ORDER — SODIUM CHLORIDE 0.9% FLUSH
3.0000 mL | Freq: Two times a day (BID) | INTRAVENOUS | Status: DC
Start: 2023-08-10 — End: 2023-08-10
  Administered 2023-08-10: 3 mL via INTRAVENOUS

## 2023-08-10 MED ORDER — DEXTROSE 50 % IV SOLN
1.0000 | Freq: Once | INTRAVENOUS | Status: AC
  Administered 2023-08-10: 50 mL via INTRAVENOUS
  Filled 2023-08-10: qty 50

## 2023-08-10 MED ORDER — DEXTROSE 5 % AND 0.9 % NACL IV BOLUS
1000.0000 mL | Freq: Once | INTRAVENOUS | Status: AC
  Administered 2023-08-10: 1000 mL via INTRAVENOUS

## 2023-08-10 MED ORDER — SODIUM CHLORIDE 0.9 % IV SOLN: 250.0000 mL | INTRAVENOUS | Status: DC | PRN

## 2023-08-10 MED ORDER — DEXTROSE 50 % IV SOLN
INTRAVENOUS | Status: AC
  Filled 2023-08-10: qty 50

## 2023-08-10 MED ORDER — SODIUM CHLORIDE 0.9% FLUSH: 3.0000 mL | INTRAVENOUS | Status: DC | PRN

## 2023-08-10 NOTE — ED Provider Notes (Signed)
 [place] EMERGENCY DEPARTMENT AT [facility] Provider Note   CSN: [id number] Arrival date & time: 08/10/23  7829     History Chief Complaint  Patient presents with   Hypoglycemia    Pedro [name] is a 28 y.o. male history of type 1 diabetes presents to the emerged from today for evaluation of hypoglycemia.  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  No seizure-like activity.  No loss of bowel or bladder control.  [facility] was called out for this.  He was found to have a blood sugar of 57.  He was given orange juice and 1 oral glucose tab and sugar went from 77 then 80.  On arrival to the ER, patient's blood glucose is 34. The patient reports that he gave himself 40 units of 70/30 insulin . He reports that he ate some waffles this AM, but did not eat much last night. He did not check his blood sugar.  He denies any recent illnesses.  Denies any headache, chest pain, shortness of breath.  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  Reports he just feels tired.  Reports daily tobacco use but denies any alcohol  or illicit drug use.   Hypoglycemia Associated symptoms: no shortness of breath and no vomiting        Home Medications Prior to Admission medications   Medication Sig Start Date End Date Taking? Authorizing Provider  ELDERBERRY PO Take 1 tablet by mouth daily as needed (immune support).    [provider]  gabapentin  (NEURONTIN ) 300 MG capsule Take 1 capsule (300 mg total) by mouth at bedtime. 07/01/21   Rayfield Cairo, MD  insulin  NPH-regular Human (70-30) 100 UNIT/ML injection Inject 10 Units into the skin 2 (two) times daily with a meal. 02/14/22   Emokpae, Courage, MD  lamoTRIgine  (LAMICTAL ) 25 MG tablet Take 1 tablet (25 mg total) by mouth 2 (two) times daily. Patient not taking: Reported on 03/28/2023 02/14/22   Colin Dawley, MD  losartan  (COZAAR ) 50 MG tablet Take 1 tablet (50 mg  total) by mouth daily. 02/14/22 02/14/23  Colin Dawley, MD  nicotine  (NICODERM CQ  - DOSED IN MG/24 HOURS) 14 mg/24hr patch Place 1 patch (14 mg total) onto the skin daily. Patient not taking: Reported on 03/28/2023 02/15/22   Colin Dawley, MD      Allergies    Sulfa antibiotics    Review of Systems   Review of Systems  Constitutional:  Positive for fatigue. Negative for chills and fever.  Respiratory:  Negative for shortness of breath.   Cardiovascular:  Negative for chest pain.  Gastrointestinal:  Negative for abdominal pain, constipation, diarrhea, nausea and vomiting.  Neurological:  Negative for headaches.    Physical Exam Updated Vital Signs BP (!) 149/107   Pulse (!) 103   Temp 97.9 F (36.6 C)   Resp 20   Ht 5\' 11"  (1.803 m)   Wt 60.8 kg   SpO2 99%   BMI 18.69 kg/m  Physical Exam Vitals and nursing note reviewed.  Constitutional:      Comments: Thin, wearing orange vest, diaphoretic. No urine or fecal matter seen on jeans.   HENT:     Mouth/Throat:     Mouth: Mucous membranes are moist.     Comments: Poor dentition.  Eyes:     General: No scleral icterus. Cardiovascular:     Rate and Rhythm: Tachycardia present.  Pulmonary:  Effort: Pulmonary effort is normal. No respiratory distress.  Abdominal:     Palpations: Abdomen is soft.     Tenderness: There is no abdominal tenderness.  Skin:    General: Skin is warm and dry.  Neurological:     General: No focal deficit present.     Mental Status: He is alert and oriented to person, place, and time.     Cranial Nerves: No dysarthria or facial asymmetry.     Motor: No weakness.     Gait: Gait normal.     ED Results / Procedures / Treatments   Labs (all labs ordered are listed, but only abnormal results are displayed) Labs Reviewed  COMPREHENSIVE METABOLIC PANEL WITH GFR - Abnormal; Notable for the following components:      Result Value   BUN 27 (*)    Creatinine, Ser 1.36 (*)    Albumin 3.2  (*)    All other components within normal limits  CBG MONITORING, ED - Abnormal; Notable for the following components:   Glucose-Capillary 34 (*)    All other components within normal limits  CBG MONITORING, ED - Abnormal; Notable for the following components:   Glucose-Capillary 48 (*)    All other components within normal limits  CBG MONITORING, ED - Abnormal; Notable for the following components:   Glucose-Capillary 221 (*)    All other components within normal limits  CBG MONITORING, ED - Abnormal; Notable for the following components:   Glucose-Capillary 201 (*)    All other components within normal limits  CBG MONITORING, ED - Abnormal; Notable for the following components:   Glucose-Capillary 196 (*)    All other components within normal limits  CBG MONITORING, ED - Abnormal; Notable for the following components:   Glucose-Capillary 207 (*)    All other components within normal limits  CBG MONITORING, ED - Abnormal; Notable for the following components:   Glucose-Capillary 198 (*)    All other components within normal limits  CBG MONITORING, ED - Abnormal; Notable for the following components:   Glucose-Capillary 160 (*)    All other components within normal limits  CBC WITH DIFFERENTIAL/PLATELET  URINALYSIS, ROUTINE W REFLEX MICROSCOPIC  CBG MONITORING, ED  CBG MONITORING, ED  CBG MONITORING, ED    EKG None  Radiology No results found.  Procedures Procedures   Medications Ordered in ED Medications  sodium chloride  flush (NS) 0.9 % injection 3 mL (3 mLs Intravenous Given 08/10/23 1159)  sodium chloride  flush (NS) 0.9 % injection 3 mL (has no administration in time range)  0.9 %  sodium chloride  infusion (has no administration in time range)  dextrose  50 % solution (  Given 08/10/23 0949)  dextrose  5 % and 0.9% NaCl 5-0.9 % bolus 1,000 mL (0 mLs Intravenous Stopped 08/10/23 1300)  dextrose  50 % solution 50 mL (50 mLs Intravenous Given 08/10/23 1118)    ED Course/  Medical Decision Making/ A&P   Medical Decision Making Amount and/or Complexity of Data Reviewed Labs: ordered.  Risk Prescription drug management.   28 y.o. male presents to the ER for evaluation of hypoglycemia. Differential diagnosis includes but is not limited to electrolyte normality, hypoglycemia, insulin  overdose. Vital signs mildly elevated pressure 130/99, tachycardia 112, otherwise afebrile, satting well aware of an increased work of breathing. Physical exam as noted above.   I independently reviewed and interpreted the patient's labs.  CBC without leukocytosis or anemia.  CMP shows BUN of 27 with a creatinine of 1.36.  Albumin  3.2.  Otherwise no electrolyte or LFT abnormality. Creatinine appears around 1-1.1 at baseline.   The patient on arrival had a blood glucose of 34.  He was given D50.  He was given 3 orange juices and consume these with the D50 blood glucoses were checked every 15 minutes.  He only got up to the 80s for blood sugar.  I did order a D5 normal saline bolus however was alerted by nursing that he dropped down into the 40s again.  At this time, I have ordered another amp of D50.  Nursing aware the need to be checking blood sugars more frequently as previously prescribed.  I discussed with patient that given the need for 2 amps of D50 as well as the fluid bolus he will need to be admitted however he is refusing.  His blood sugars have been in the 200s but most recently been in the 160.  He is refusing all other CBG checks.  He would like to be discharged home.  I discussed with him that he will need to be admitted for his hypoglycemia.  We discussed that if he were to go home and his blood sugar were to drop this can be devastation will and lead to things like coma, death, endorgan damage, etc.  Patient reports that he feels fine given that he is eaten and will monitor his blood sugars at home.  I discussed with the patient that this be AGAINST MEDICAL ADVICE which he  accepts.  Nursing called to bedside for witness.  Early Glisson, RN as witness. We discussed the nature and purpose, risks and benefits, as well as, the alternatives of treatment. Time was given to allow the opportunity to ask questions and consider their options, and after the discussion, the patient decided to refuse the offerred treatment. The patient was informed that refusal could lead to, but was not limited to, death, permanent disability, or severe pain. No family members present, the patient does not want me calling his wife/partner. Prior to refusing, I determined that the patient had the capacity to make their decision and understood the consequences of that decision. After refusal, I made every reasonable opportunity to treat them to the best of my ability.  The patient was notified that they may return to the emergency department at any time for further treatment.  Discussed with him to make sure that he is eating and continuously taking his blood sugars.   I discussed this case with my attending physician who cosigned this note including patient's presenting symptoms, physical exam, and planned diagnostics and interventions. Attending physician stated agreement with plan or made changes to plan which were implemented.   Attending physician assessed patient at bedside.  Portions of this report may have been transcribed using voice recognition software. Every effort was made to ensure accuracy; however, inadvertent computerized transcription errors may be present.    Final Clinical Impression(s) / ED Diagnoses Final diagnoses:  Type 1 diabetes mellitus with hypoglycemia without coma (HCC)  Left against medical advice  AKI (acute kidney injury) [facility])    Rx / DC Orders ED Discharge Orders     None         Spence Dux, PA-C 08/10/23 1937    Merdis Stalling, MD 08/11/23 1700

## 2023-08-10 NOTE — ED Notes (Signed)
 Witnessed [place], [place] discuss risks of not being admitted to hospital for low blood glucose. Risk of coma, death, and organ failure. Patient accepted those risks and wanted to leave AMA.

## 2023-08-10 NOTE — ED Notes (Signed)
 Verbal order for q 15 mins cbg.

## 2023-08-10 NOTE — Discharge Instructions (Addendum)
 You were seen in the ER for evaluation of your low blood sugar/ hypoglycemia.  You are leaving AGAINST MEDICAL ADVICE.  We encourage you to stay as this could be deadly without constant monitoring of your blood sugar evidence of need for additional glucose.  You have excepted the risk.  You are allowed to return to the ER anytime to continue your care.  Please make sure you are checking your blood sugar frequently and make sure that you are eating throughout the day to avoid getting low again.  Again, you can return to the ER at any time to be admitted to continue your care.

## 2023-08-10 NOTE — ED Triage Notes (Signed)
 Pt arrived [facility] from work with c/o hypoglycemia. Upon arrival pt cbg was 67. [facility] gave 1 oral tube of glucose and 1 OJ . Recheck was 77 and then 80. Pt took 40 units of 70/30 around 8am. Pt alert and verbal with sweaty. CBG check in ED 34.

## 2023-08-17 DIAGNOSIS — Z419 Encounter for procedure for purposes other than remedying health state, unspecified: Secondary | ICD-10-CM | POA: Diagnosis not present

## 2023-08-22 DIAGNOSIS — H5213 Myopia, bilateral: Secondary | ICD-10-CM | POA: Diagnosis not present

## 2023-09-16 DIAGNOSIS — Z419 Encounter for procedure for purposes other than remedying health state, unspecified: Secondary | ICD-10-CM | POA: Diagnosis not present

## 2023-09-19 ENCOUNTER — Ambulatory Visit (HOSPITAL_COMMUNITY)
Admission: RE | Admit: 2023-09-19 | Discharge: 2023-09-19 | Disposition: A | Discharge status: Home / Self Care | Source: Ambulatory Visit | Attending: Family Medicine | Admitting: Family Medicine

## 2023-09-19 ENCOUNTER — Other Ambulatory Visit (HOSPITAL_COMMUNITY): Payer: Self-pay | Admitting: Family Medicine

## 2023-09-19 DIAGNOSIS — R0789 Other chest pain: Secondary | ICD-10-CM

## 2023-09-19 DIAGNOSIS — E1065 Type 1 diabetes mellitus with hyperglycemia: Secondary | ICD-10-CM | POA: Diagnosis not present

## 2023-09-19 DIAGNOSIS — R569 Unspecified convulsions: Secondary | ICD-10-CM | POA: Diagnosis not present

## 2023-09-19 DIAGNOSIS — R918 Other nonspecific abnormal finding of lung field: Secondary | ICD-10-CM | POA: Diagnosis not present

## 2023-09-19 DIAGNOSIS — R079 Chest pain, unspecified: Secondary | ICD-10-CM | POA: Diagnosis not present

## 2023-09-19 DIAGNOSIS — E1140 Type 2 diabetes mellitus with diabetic neuropathy, unspecified: Secondary | ICD-10-CM | POA: Diagnosis not present

## 2023-09-19 DIAGNOSIS — E1042 Type 1 diabetes mellitus with diabetic polyneuropathy: Secondary | ICD-10-CM | POA: Diagnosis not present

## 2023-09-19 DIAGNOSIS — Z79899 Other long term (current) drug therapy: Secondary | ICD-10-CM | POA: Diagnosis not present

## 2023-09-19 DIAGNOSIS — E1069 Type 1 diabetes mellitus with other specified complication: Secondary | ICD-10-CM | POA: Diagnosis not present

## 2023-09-28 DIAGNOSIS — R569 Unspecified convulsions: Secondary | ICD-10-CM | POA: Diagnosis not present

## 2023-09-28 DIAGNOSIS — E1065 Type 1 diabetes mellitus with hyperglycemia: Secondary | ICD-10-CM | POA: Diagnosis not present

## 2023-09-28 DIAGNOSIS — E1069 Type 1 diabetes mellitus with other specified complication: Secondary | ICD-10-CM | POA: Diagnosis not present

## 2023-10-16 ENCOUNTER — Other Ambulatory Visit: Payer: Self-pay

## 2023-10-16 ENCOUNTER — Inpatient Hospital Stay (HOSPITAL_COMMUNITY)
Admission: EM | Admit: 2023-10-16 | Discharge: 2023-10-18 | DRG: 637 | Disposition: A | Discharge status: Home / Self Care | Attending: Family Medicine | Admitting: Family Medicine

## 2023-10-16 ENCOUNTER — Encounter (HOSPITAL_COMMUNITY): Payer: Self-pay

## 2023-10-16 DIAGNOSIS — E1010 Type 1 diabetes mellitus with ketoacidosis without coma: Principal | ICD-10-CM | POA: Diagnosis present

## 2023-10-16 DIAGNOSIS — D75839 Thrombocytosis, unspecified: Secondary | ICD-10-CM | POA: Diagnosis present

## 2023-10-16 DIAGNOSIS — E1311 Other specified diabetes mellitus with ketoacidosis with coma: Secondary | ICD-10-CM

## 2023-10-16 DIAGNOSIS — G629 Polyneuropathy, unspecified: Secondary | ICD-10-CM

## 2023-10-16 DIAGNOSIS — R197 Diarrhea, unspecified: Secondary | ICD-10-CM | POA: Diagnosis present

## 2023-10-16 DIAGNOSIS — R112 Nausea with vomiting, unspecified: Secondary | ICD-10-CM | POA: Diagnosis present

## 2023-10-16 DIAGNOSIS — Z833 Family history of diabetes mellitus: Secondary | ICD-10-CM

## 2023-10-16 DIAGNOSIS — Z79899 Other long term (current) drug therapy: Secondary | ICD-10-CM

## 2023-10-16 DIAGNOSIS — R6510 Systemic inflammatory response syndrome (SIRS) of non-infectious origin without acute organ dysfunction: Secondary | ICD-10-CM | POA: Diagnosis present

## 2023-10-16 DIAGNOSIS — E7800 Pure hypercholesterolemia, unspecified: Secondary | ICD-10-CM | POA: Diagnosis present

## 2023-10-16 DIAGNOSIS — F17210 Nicotine dependence, cigarettes, uncomplicated: Secondary | ICD-10-CM | POA: Diagnosis present

## 2023-10-16 DIAGNOSIS — R6511 Systemic inflammatory response syndrome (SIRS) of non-infectious origin with acute organ dysfunction: Secondary | ICD-10-CM | POA: Diagnosis present

## 2023-10-16 DIAGNOSIS — I10 Essential (primary) hypertension: Secondary | ICD-10-CM | POA: Diagnosis present

## 2023-10-16 DIAGNOSIS — E876 Hypokalemia: Secondary | ICD-10-CM | POA: Diagnosis present

## 2023-10-16 DIAGNOSIS — Z8249 Family history of ischemic heart disease and other diseases of the circulatory system: Secondary | ICD-10-CM

## 2023-10-16 DIAGNOSIS — F1910 Other psychoactive substance abuse, uncomplicated: Secondary | ICD-10-CM | POA: Diagnosis present

## 2023-10-16 DIAGNOSIS — D72829 Elevated white blood cell count, unspecified: Secondary | ICD-10-CM | POA: Diagnosis present

## 2023-10-16 DIAGNOSIS — E1040 Type 1 diabetes mellitus with diabetic neuropathy, unspecified: Secondary | ICD-10-CM | POA: Diagnosis present

## 2023-10-16 DIAGNOSIS — Z794 Long term (current) use of insulin: Secondary | ICD-10-CM

## 2023-10-16 DIAGNOSIS — E1110 Type 2 diabetes mellitus with ketoacidosis without coma: Secondary | ICD-10-CM | POA: Diagnosis present

## 2023-10-16 DIAGNOSIS — Z882 Allergy status to sulfonamides status: Secondary | ICD-10-CM

## 2023-10-16 DIAGNOSIS — N179 Acute kidney failure, unspecified: Secondary | ICD-10-CM | POA: Diagnosis present

## 2023-10-16 DIAGNOSIS — F909 Attention-deficit hyperactivity disorder, unspecified type: Secondary | ICD-10-CM | POA: Diagnosis present

## 2023-10-16 DIAGNOSIS — Z818 Family history of other mental and behavioral disorders: Secondary | ICD-10-CM

## 2023-10-16 DIAGNOSIS — F1290 Cannabis use, unspecified, uncomplicated: Secondary | ICD-10-CM | POA: Diagnosis present

## 2023-10-16 DIAGNOSIS — E860 Dehydration: Secondary | ICD-10-CM | POA: Diagnosis present

## 2023-10-16 LAB — CBC WITH DIFFERENTIAL/PLATELET
Abs Immature Granulocytes: 0.16 K/uL — ABNORMAL HIGH (ref 0.00–0.07)
Basophils Absolute: 0.1 K/uL (ref 0.0–0.1)
Basophils Relative: 0 %
Eosinophils Absolute: 0 K/uL (ref 0.0–0.5)
Eosinophils Relative: 0 %
HCT: 50.1 % (ref 39.0–52.0)
Hemoglobin: 17.1 g/dL — ABNORMAL HIGH (ref 13.0–17.0)
Immature Granulocytes: 1 %
Lymphocytes Relative: 8 %
Lymphs Abs: 1.9 K/uL (ref 0.7–4.0)
MCH: 30.8 pg (ref 26.0–34.0)
MCHC: 34.1 g/dL (ref 30.0–36.0)
MCV: 90.3 fL (ref 80.0–100.0)
Monocytes Absolute: 1.1 K/uL — ABNORMAL HIGH (ref 0.1–1.0)
Monocytes Relative: 5 %
Neutro Abs: 20.2 K/uL — ABNORMAL HIGH (ref 1.7–7.7)
Neutrophils Relative %: 86 %
Platelets: 408 K/uL — ABNORMAL HIGH (ref 150–400)
RBC: 5.55 MIL/uL (ref 4.22–5.81)
RDW: 12.8 % (ref 11.5–15.5)
WBC: 23.4 K/uL — ABNORMAL HIGH (ref 4.0–10.5)
nRBC: 0 % (ref 0.0–0.2)

## 2023-10-16 LAB — COMPREHENSIVE METABOLIC PANEL WITH GFR
ALT: 28 U/L (ref 0–44)
AST: 21 U/L (ref 15–41)
Albumin: 4.4 g/dL (ref 3.5–5.0)
Alkaline Phosphatase: 130 U/L — ABNORMAL HIGH (ref 38–126)
Anion gap: 33 — ABNORMAL HIGH (ref 5–15)
BUN: 75 mg/dL — ABNORMAL HIGH (ref 6–20)
CO2: 15 mmol/L — ABNORMAL LOW (ref 22–32)
Calcium: 9.5 mg/dL (ref 8.9–10.3)
Chloride: 88 mmol/L — ABNORMAL LOW (ref 98–111)
Creatinine, Ser: 2.58 mg/dL — ABNORMAL HIGH (ref 0.61–1.24)
GFR, Estimated: 33 mL/min — ABNORMAL LOW (ref >60)
Glucose, Bld: 761 mg/dL (ref 70–99)
Potassium: 5.1 mmol/L (ref 3.5–5.1)
Sodium: 136 mmol/L (ref 135–145)
Total Bilirubin: 2.4 mg/dL — ABNORMAL HIGH (ref 0.0–1.2)
Total Protein: 8.4 g/dL — ABNORMAL HIGH (ref 6.5–8.1)

## 2023-10-16 LAB — BLOOD GAS, VENOUS
Acid-base deficit: 7.7 mmol/L — ABNORMAL HIGH (ref 0.0–2.0)
Bicarbonate: 18.3 mmol/L — ABNORMAL LOW (ref 20.0–28.0)
Drawn by: 53361
O2 Saturation: 57.2 %
Patient temperature: 36.1
pCO2, Ven: 37 mmHg — ABNORMAL LOW (ref 44–60)
pH, Ven: 7.3 (ref 7.25–7.43)
pO2, Ven: <31 mmHg — CL (ref 32–45)

## 2023-10-16 LAB — LACTIC ACID, PLASMA
Lactic Acid, Venous: 3.6 mmol/L (ref 0.5–1.9)
Lactic Acid, Venous: 3.7 mmol/L (ref 0.5–1.9)

## 2023-10-16 LAB — CBG MONITORING, ED: Glucose-Capillary: >600 mg/dL (ref 70–99)

## 2023-10-16 MED ORDER — LACTATED RINGERS IV BOLUS
1000.0000 mL | Freq: Once | INTRAVENOUS | Status: AC
  Administered 2023-10-16 (×2): 1000 mL via INTRAVENOUS

## 2023-10-16 NOTE — ED Triage Notes (Signed)
 Pt comes from home by EMS for n/v/d. Pt has been able to keep anything down for the past couple of days. No cardiac hx. Pt is A&Ox4.

## 2023-10-16 NOTE — ED Provider Notes (Signed)
 [place] EMERGENCY DEPARTMENT AT [facility] Provider Note   CSN: [id number] Arrival date & time: 10/16/23  2155     Patient presents with: Nausea and Emesis   Pedro [name] is a 28 y.o. male.    Emesis Patient presents with nausea vomiting diarrhea.  Has had for last few days.  States unable to keep things down.  States feels dehydrated.  Also has been working outside.  Of note patient is a type I diabetic with a history of DKA.    Past Medical History:  Diagnosis Date   Acute kidney injury (HCC) 08/19/2021   ADHD (attention deficit hyperactivity disorder)    AKI (acute kidney injury) (HCC) 10/17/2014   Diabetic ketoacidosis (HCC) 10/05/2013   DKA, type 1 (HCC) 12/15/2013   DM type 1 (diabetes mellitus, type 1) (HCC)    diagnosed age 69   Gastroenteritis 02/16/2014   Genital herpes simplex type 2 12/15/2013   Herpes    High cholesterol    Hyperkalemia 10/15/2012   Hypertension    Noncompliance 10/15/2012   Noncompliance with medications    Polysubstance dependence (HCC) 07/22/2011   SIRS (systemic inflammatory response syndrome) (HCC) 12/25/2021   Substance induced mood disorder (HCC) 07/22/2011   Thrombocytosis 08/19/2021   Tobacco abuse 12/15/2013    Prior to Admission medications   Medication Sig Start Date End Date Taking? Authorizing Provider  ELDERBERRY PO Take 1 tablet by mouth daily as needed (immune support).    [provider]  gabapentin  (NEURONTIN ) 300 MG capsule Take 1 capsule (300 mg total) by mouth at bedtime. 07/01/21   Vicci Afton CROME, MD  insulin  NPH-regular Human (70-30) 100 UNIT/ML injection Inject 10 Units into the skin 2 (two) times daily with a meal. 02/14/22   Emokpae, Courage, MD  lamoTRIgine  (LAMICTAL ) 25 MG tablet Take 1 tablet (25 mg total) by mouth 2 (two) times daily. Patient not taking: Reported on 03/28/2023 02/14/22   Pearlean Manus, MD  losartan  (COZAAR ) 50 MG tablet Take 1 tablet (50 mg total) by mouth  daily. 02/14/22 02/14/23  Pearlean Manus, MD  nicotine  (NICODERM CQ  - DOSED IN MG/24 HOURS) 14 mg/24hr patch Place 1 patch (14 mg total) onto the skin daily. Patient not taking: Reported on 03/28/2023 02/15/22   Pearlean Manus, MD    Allergies: Sulfa antibiotics    Review of Systems  Gastrointestinal:  Positive for vomiting.    Updated Vital Signs BP (!) 152/113   Pulse (!) 140   Temp (!) 97 F (36.1 C) (Tympanic)   Resp (!) 22   Ht 5' 11 (1.803 m)   Wt 60.8 kg   SpO2 99%   BMI 18.69 kg/m   Physical Exam Vitals reviewed.  HENT:     Mouth/Throat:     Mouth: Mucous membranes are dry.  Cardiovascular:     Rate and Rhythm: Tachycardia present.  Chest:     Chest wall: No tenderness.  Abdominal:     Tenderness: There is no abdominal tenderness.  Musculoskeletal:     Right lower leg: No edema.     Left lower leg: No edema.  Skin:    Capillary Refill: Capillary refill takes less than 2 seconds.  Neurological:     Mental Status: He is alert and oriented to person, place, and time.     (all labs ordered are listed, but only abnormal results are displayed) Labs Reviewed  CBC WITH DIFFERENTIAL/PLATELET - Abnormal; Notable for the following components:  Result Value   WBC 23.4 (*)    Hemoglobin 17.1 (*)    Platelets 408 (*)    Neutro Abs 20.2 (*)    Monocytes Absolute 1.1 (*)    Abs Immature Granulocytes 0.16 (*)    All other components within normal limits  CBG MONITORING, ED - Abnormal; Notable for the following components:   Glucose-Capillary >600 (*)    All other components within normal limits  LACTIC ACID, PLASMA  LACTIC ACID, PLASMA  COMPREHENSIVE METABOLIC PANEL WITH GFR  BLOOD GAS, VENOUS  BETA-HYDROXYBUTYRIC ACID    EKG: EKG Interpretation Date/Time:  Monday October 16 2023 22:22:19 EDT Ventricular Rate:  128 PR Interval:  119 QRS Duration:  97 QT Interval:  311 QTC Calculation: 454 R Axis:   102  Text Interpretation: duplicate Confirmed  by Patsey Lot [phone]) on 10/16/2023 10:30:40 PM  Radiology: No results found.   Procedures   Medications Ordered in the ED  lactated ringers  bolus 1,000 mL (1,000 mLs Intravenous New Bag/Given 10/16/23 2241)  lactated ringers  bolus 1,000 mL (1,000 mLs Intravenous New Bag/Given 10/16/23 2259)                                    Medical Decision Making Amount and/or Complexity of Data Reviewed Labs: ordered.   Patient with nausea vomiting diarrhea.  Also sugar of over 600.  I think likely in DKA.  Fluid boluses given.  Will wait on blood work for further treatment such as insulin .  Sepsis felt less likely.  Care turned over to Dr. Wanita  CRITICAL CARE Performed by: Lot Patsey Total critical care time: 30 minutes Critical care time was exclusive of separately billable procedures and treating other patients. Critical care was necessary to treat or prevent imminent or life-threatening deterioration. Critical care was time spent personally by me on the following activities: development of treatment plan with patient and/or surrogate as well as nursing, discussions with consultants, evaluation of patient's response to treatment, examination of patient, obtaining history from patient or surrogate, ordering and performing treatments and interventions, ordering and review of laboratory studies, ordering and review of radiographic studies, pulse oximetry and re-evaluation of patient's condition.      Final diagnoses:  None    ED Discharge Orders     None          Patsey Lot, MD 10/16/23 2328

## 2023-10-17 ENCOUNTER — Other Ambulatory Visit: Payer: Self-pay

## 2023-10-17 DIAGNOSIS — R6510 Systemic inflammatory response syndrome (SIRS) of non-infectious origin without acute organ dysfunction: Secondary | ICD-10-CM

## 2023-10-17 DIAGNOSIS — D72829 Elevated white blood cell count, unspecified: Secondary | ICD-10-CM | POA: Diagnosis not present

## 2023-10-17 DIAGNOSIS — R6511 Systemic inflammatory response syndrome (SIRS) of non-infectious origin with acute organ dysfunction: Secondary | ICD-10-CM | POA: Diagnosis not present

## 2023-10-17 DIAGNOSIS — R112 Nausea with vomiting, unspecified: Secondary | ICD-10-CM | POA: Diagnosis present

## 2023-10-17 DIAGNOSIS — E876 Hypokalemia: Secondary | ICD-10-CM | POA: Diagnosis not present

## 2023-10-17 DIAGNOSIS — Z833 Family history of diabetes mellitus: Secondary | ICD-10-CM | POA: Diagnosis not present

## 2023-10-17 DIAGNOSIS — I10 Essential (primary) hypertension: Secondary | ICD-10-CM | POA: Diagnosis not present

## 2023-10-17 DIAGNOSIS — F1910 Other psychoactive substance abuse, uncomplicated: Secondary | ICD-10-CM | POA: Diagnosis not present

## 2023-10-17 DIAGNOSIS — Z79899 Other long term (current) drug therapy: Secondary | ICD-10-CM | POA: Diagnosis not present

## 2023-10-17 DIAGNOSIS — Z419 Encounter for procedure for purposes other than remedying health state, unspecified: Secondary | ICD-10-CM | POA: Diagnosis not present

## 2023-10-17 DIAGNOSIS — E1010 Type 1 diabetes mellitus with ketoacidosis without coma: Secondary | ICD-10-CM | POA: Diagnosis not present

## 2023-10-17 DIAGNOSIS — Z818 Family history of other mental and behavioral disorders: Secondary | ICD-10-CM | POA: Diagnosis not present

## 2023-10-17 DIAGNOSIS — R197 Diarrhea, unspecified: Secondary | ICD-10-CM | POA: Diagnosis present

## 2023-10-17 DIAGNOSIS — Z8249 Family history of ischemic heart disease and other diseases of the circulatory system: Secondary | ICD-10-CM | POA: Diagnosis not present

## 2023-10-17 DIAGNOSIS — E860 Dehydration: Secondary | ICD-10-CM | POA: Diagnosis not present

## 2023-10-17 DIAGNOSIS — F1290 Cannabis use, unspecified, uncomplicated: Secondary | ICD-10-CM | POA: Diagnosis present

## 2023-10-17 DIAGNOSIS — F909 Attention-deficit hyperactivity disorder, unspecified type: Secondary | ICD-10-CM | POA: Diagnosis present

## 2023-10-17 DIAGNOSIS — E7800 Pure hypercholesterolemia, unspecified: Secondary | ICD-10-CM | POA: Diagnosis not present

## 2023-10-17 DIAGNOSIS — E1040 Type 1 diabetes mellitus with diabetic neuropathy, unspecified: Secondary | ICD-10-CM | POA: Diagnosis not present

## 2023-10-17 DIAGNOSIS — E1110 Type 2 diabetes mellitus with ketoacidosis without coma: Secondary | ICD-10-CM

## 2023-10-17 DIAGNOSIS — N179 Acute kidney failure, unspecified: Secondary | ICD-10-CM

## 2023-10-17 DIAGNOSIS — F17210 Nicotine dependence, cigarettes, uncomplicated: Secondary | ICD-10-CM | POA: Diagnosis present

## 2023-10-17 DIAGNOSIS — Z882 Allergy status to sulfonamides status: Secondary | ICD-10-CM | POA: Diagnosis not present

## 2023-10-17 DIAGNOSIS — G629 Polyneuropathy, unspecified: Secondary | ICD-10-CM

## 2023-10-17 DIAGNOSIS — Z794 Long term (current) use of insulin: Secondary | ICD-10-CM | POA: Diagnosis not present

## 2023-10-17 DIAGNOSIS — G6289 Other specified polyneuropathies: Secondary | ICD-10-CM | POA: Diagnosis not present

## 2023-10-17 DIAGNOSIS — D75839 Thrombocytosis, unspecified: Secondary | ICD-10-CM | POA: Diagnosis not present

## 2023-10-17 LAB — BASIC METABOLIC PANEL WITH GFR
Anion gap: 21 — ABNORMAL HIGH (ref 5–15)
Anion gap: 15 (ref 5–15)
Anion gap: 10 (ref 5–15)
Anion gap: 10 (ref 5–15)
BUN: 69 mg/dL — ABNORMAL HIGH (ref 6–20)
BUN: 60 mg/dL — ABNORMAL HIGH (ref 6–20)
BUN: 52 mg/dL — ABNORMAL HIGH (ref 6–20)
BUN: 41 mg/dL — ABNORMAL HIGH (ref 6–20)
CO2: 22 mmol/L (ref 22–32)
CO2: 24 mmol/L (ref 22–32)
CO2: 25 mmol/L (ref 22–32)
CO2: 26 mmol/L (ref 22–32)
Calcium: 9.2 mg/dL (ref 8.9–10.3)
Calcium: 8.7 mg/dL — ABNORMAL LOW (ref 8.9–10.3)
Calcium: 8 mg/dL — ABNORMAL LOW (ref 8.9–10.3)
Calcium: 7.9 mg/dL — ABNORMAL LOW (ref 8.9–10.3)
Chloride: 98 mmol/L (ref 98–111)
Chloride: 104 mmol/L (ref 98–111)
Chloride: 108 mmol/L (ref 98–111)
Chloride: 105 mmol/L (ref 98–111)
Creatinine, Ser: 2.24 mg/dL — ABNORMAL HIGH (ref 0.61–1.24)
Creatinine, Ser: 1.71 mg/dL — ABNORMAL HIGH (ref 0.61–1.24)
Creatinine, Ser: 1.49 mg/dL — ABNORMAL HIGH (ref 0.61–1.24)
Creatinine, Ser: 1.13 mg/dL (ref 0.61–1.24)
GFR, Estimated: 39 mL/min — ABNORMAL LOW (ref >60)
GFR, Estimated: 55 mL/min — ABNORMAL LOW (ref >60)
GFR, Estimated: >60 mL/min (ref >60)
GFR, Estimated: >60 mL/min (ref >60)
Glucose, Bld: 469 mg/dL — ABNORMAL HIGH (ref 70–99)
Glucose, Bld: 222 mg/dL — ABNORMAL HIGH (ref 70–99)
Glucose, Bld: 159 mg/dL — ABNORMAL HIGH (ref 70–99)
Glucose, Bld: 202 mg/dL — ABNORMAL HIGH (ref 70–99)
Potassium: 3.9 mmol/L (ref 3.5–5.1)
Potassium: 3.7 mmol/L (ref 3.5–5.1)
Potassium: 3.4 mmol/L — ABNORMAL LOW (ref 3.5–5.1)
Potassium: 3.1 mmol/L — ABNORMAL LOW (ref 3.5–5.1)
Sodium: 141 mmol/L (ref 135–145)
Sodium: 143 mmol/L (ref 135–145)
Sodium: 143 mmol/L (ref 135–145)
Sodium: 141 mmol/L (ref 135–145)

## 2023-10-17 LAB — URINALYSIS, ROUTINE W REFLEX MICROSCOPIC
Bacteria, UA: NONE SEEN
Bilirubin Urine: NEGATIVE
Glucose, UA: >=500 mg/dL — AB
Ketones, ur: 80 mg/dL — AB
Leukocytes,Ua: NEGATIVE
Nitrite: NEGATIVE
Protein, ur: 100 mg/dL — AB
Specific Gravity, Urine: 1.022 (ref 1.005–1.030)
pH: 5 (ref 5.0–8.0)

## 2023-10-17 LAB — RENAL FUNCTION PANEL
Albumin: 2.9 g/dL — ABNORMAL LOW (ref 3.5–5.0)
Anion gap: 13 (ref 5–15)
BUN: 46 mg/dL — ABNORMAL HIGH (ref 6–20)
CO2: 26 mmol/L (ref 22–32)
Calcium: 8.1 mg/dL — ABNORMAL LOW (ref 8.9–10.3)
Chloride: 103 mmol/L (ref 98–111)
Creatinine, Ser: 1.32 mg/dL — ABNORMAL HIGH (ref 0.61–1.24)
GFR, Estimated: >60 mL/min (ref >60)
Glucose, Bld: 189 mg/dL — ABNORMAL HIGH (ref 70–99)
Phosphorus: 2.4 mg/dL — ABNORMAL LOW (ref 2.5–4.6)
Potassium: 3.5 mmol/L (ref 3.5–5.1)
Sodium: 142 mmol/L (ref 135–145)

## 2023-10-17 LAB — CBG MONITORING, ED
Glucose-Capillary: 590 mg/dL (ref 70–99)
Glucose-Capillary: >600 mg/dL (ref 70–99)
Glucose-Capillary: >600 mg/dL (ref 70–99)

## 2023-10-17 LAB — CBC
HCT: 45 % (ref 39.0–52.0)
Hemoglobin: 15.6 g/dL (ref 13.0–17.0)
MCH: 30.7 pg (ref 26.0–34.0)
MCHC: 34.7 g/dL (ref 30.0–36.0)
MCV: 88.6 fL (ref 80.0–100.0)
Platelets: 372 K/uL (ref 150–400)
RBC: 5.08 MIL/uL (ref 4.22–5.81)
RDW: 12.8 % (ref 11.5–15.5)
WBC: 24.4 K/uL — ABNORMAL HIGH (ref 4.0–10.5)
nRBC: 0 % (ref 0.0–0.2)

## 2023-10-17 LAB — GLUCOSE, CAPILLARY
Glucose-Capillary: 536 mg/dL (ref 70–99)
Glucose-Capillary: 370 mg/dL — ABNORMAL HIGH (ref 70–99)
Glucose-Capillary: 549 mg/dL (ref 70–99)
Glucose-Capillary: 301 mg/dL — ABNORMAL HIGH (ref 70–99)
Glucose-Capillary: 256 mg/dL — ABNORMAL HIGH (ref 70–99)
Glucose-Capillary: 224 mg/dL — ABNORMAL HIGH (ref 70–99)
Glucose-Capillary: 209 mg/dL — ABNORMAL HIGH (ref 70–99)
Glucose-Capillary: 181 mg/dL — ABNORMAL HIGH (ref 70–99)
Glucose-Capillary: 170 mg/dL — ABNORMAL HIGH (ref 70–99)
Glucose-Capillary: 194 mg/dL — ABNORMAL HIGH (ref 70–99)
Glucose-Capillary: 145 mg/dL — ABNORMAL HIGH (ref 70–99)
Glucose-Capillary: 164 mg/dL — ABNORMAL HIGH (ref 70–99)
Glucose-Capillary: 204 mg/dL — ABNORMAL HIGH (ref 70–99)
Glucose-Capillary: 171 mg/dL — ABNORMAL HIGH (ref 70–99)
Glucose-Capillary: 188 mg/dL — ABNORMAL HIGH (ref 70–99)
Glucose-Capillary: 185 mg/dL — ABNORMAL HIGH (ref 70–99)
Glucose-Capillary: 198 mg/dL — ABNORMAL HIGH (ref 70–99)
Glucose-Capillary: 193 mg/dL — ABNORMAL HIGH (ref 70–99)
Glucose-Capillary: 201 mg/dL — ABNORMAL HIGH (ref 70–99)
Glucose-Capillary: 199 mg/dL — ABNORMAL HIGH (ref 70–99)
Glucose-Capillary: 162 mg/dL — ABNORMAL HIGH (ref 70–99)
Glucose-Capillary: 216 mg/dL — ABNORMAL HIGH (ref 70–99)

## 2023-10-17 LAB — BETA-HYDROXYBUTYRIC ACID
Beta-Hydroxybutyric Acid: >8 mmol/L — ABNORMAL HIGH (ref 0.05–0.27)
Beta-Hydroxybutyric Acid: 6.69 mmol/L — ABNORMAL HIGH (ref 0.05–0.27)
Beta-Hydroxybutyric Acid: 3.87 mmol/L — ABNORMAL HIGH (ref 0.05–0.27)
Beta-Hydroxybutyric Acid: 3.14 mmol/L — ABNORMAL HIGH (ref 0.05–0.27)
Beta-Hydroxybutyric Acid: 1.89 mmol/L — ABNORMAL HIGH (ref 0.05–0.27)
Beta-Hydroxybutyric Acid: 0.99 mmol/L — ABNORMAL HIGH (ref 0.05–0.27)

## 2023-10-17 LAB — RAPID URINE DRUG SCREEN, HOSP PERFORMED
Amphetamines: NOT DETECTED
Barbiturates: NOT DETECTED
Benzodiazepines: NOT DETECTED
Cocaine: NOT DETECTED
Opiates: NOT DETECTED
Tetrahydrocannabinol: POSITIVE — AB

## 2023-10-17 LAB — HIV ANTIBODY (ROUTINE TESTING W REFLEX): HIV Screen 4th Generation wRfx: NONREACTIVE

## 2023-10-17 LAB — MRSA NEXT GEN BY PCR, NASAL: MRSA by PCR Next Gen: NOT DETECTED

## 2023-10-17 MED ORDER — LACTATED RINGERS IV SOLN: INTRAVENOUS | Status: AC

## 2023-10-17 MED ORDER — ONDANSETRON HCL 4 MG/2ML IJ SOLN
4.0000 mg | Freq: Four times a day (QID) | INTRAMUSCULAR | Status: DC | PRN
  Administered 2023-10-17 – 2023-10-18 (×4): 4 mg via INTRAVENOUS
  Filled 2023-10-17 (×2): qty 2

## 2023-10-17 MED ORDER — INSULIN REGULAR(HUMAN) IN NACL 100-0.9 UT/100ML-% IV SOLN
INTRAVENOUS | Status: DC
  Administered 2023-10-17 (×2): 6.5 [IU]/h via INTRAVENOUS
  Filled 2023-10-17: qty 100

## 2023-10-17 MED ORDER — ENOXAPARIN SODIUM 40 MG/0.4ML IJ SOSY
40.0000 mg | PREFILLED_SYRINGE | INTRAMUSCULAR | Status: DC
  Administered 2023-10-17 – 2023-10-18 (×4): 40 mg via SUBCUTANEOUS
  Filled 2023-10-17 (×2): qty 0.4

## 2023-10-17 MED ORDER — LACTATED RINGERS IV BOLUS
20.0000 mL/kg | Freq: Once | INTRAVENOUS | Status: AC
  Administered 2023-10-17 (×2): 1216 mL via INTRAVENOUS

## 2023-10-17 MED ORDER — SODIUM CHLORIDE 0.9 % IV BOLUS
1000.0000 mL | Freq: Once | INTRAVENOUS | Status: AC
  Administered 2023-10-17 (×2): 1000 mL via INTRAVENOUS

## 2023-10-17 MED ORDER — ACETAMINOPHEN 325 MG PO TABS: 650.0000 mg | ORAL_TABLET | Freq: Four times a day (QID) | ORAL | Status: DC | PRN

## 2023-10-17 MED ORDER — POTASSIUM CHLORIDE 10 MEQ/100ML IV SOLN
10.0000 meq | INTRAVENOUS | Status: AC
  Administered 2023-10-17 (×8): 10 meq via INTRAVENOUS
  Filled 2023-10-17 (×4): qty 100

## 2023-10-17 MED ORDER — ONDANSETRON HCL 4 MG PO TABS: 4.0000 mg | ORAL_TABLET | Freq: Four times a day (QID) | ORAL | Status: DC | PRN

## 2023-10-17 MED ORDER — GABAPENTIN 300 MG PO CAPS
300.0000 mg | ORAL_CAPSULE | Freq: Every day | ORAL | Status: DC
Start: 2023-10-17 — End: 2023-10-18
  Filled 2023-10-17 (×2): qty 1

## 2023-10-17 MED ORDER — LACTATED RINGERS IV SOLN: INTRAVENOUS | Status: DC

## 2023-10-17 MED ORDER — INSULIN ASPART 100 UNIT/ML IJ SOLN: 0.0000 [IU] | Freq: Three times a day (TID) | INTRAMUSCULAR | Status: DC

## 2023-10-17 MED ORDER — CHLORHEXIDINE GLUCONATE CLOTH 2 % EX PADS
6.0000 | MEDICATED_PAD | Freq: Every day | CUTANEOUS | Status: DC
  Administered 2023-10-17 – 2023-10-18 (×4): 6 via TOPICAL

## 2023-10-17 MED ORDER — DEXTROSE IN LACTATED RINGERS 5 % IV SOLN: INTRAVENOUS | Status: DC

## 2023-10-17 MED ORDER — POTASSIUM CHLORIDE CRYS ER 20 MEQ PO TBCR
40.0000 meq | EXTENDED_RELEASE_TABLET | ORAL | Status: AC
  Administered 2023-10-17 (×4): 40 meq via ORAL
  Filled 2023-10-17 (×2): qty 2

## 2023-10-17 MED ORDER — ACETAMINOPHEN 650 MG RE SUPP: 650.0000 mg | Freq: Four times a day (QID) | RECTAL | Status: DC | PRN

## 2023-10-17 MED ORDER — DEXTROSE 50 % IV SOLN: 0.0000 mL | INTRAVENOUS | Status: DC | PRN

## 2023-10-17 MED ORDER — INSULIN GLARGINE-YFGN 100 UNIT/ML ~~LOC~~ SOLN
12.0000 [IU] | Freq: Every day | SUBCUTANEOUS | Status: DC
  Administered 2023-10-17 (×2): 12 [IU] via SUBCUTANEOUS
  Filled 2023-10-17: qty 0.12

## 2023-10-17 MED ORDER — TRAZODONE HCL 50 MG PO TABS: 25.0000 mg | ORAL_TABLET | Freq: Every evening | ORAL | Status: DC | PRN

## 2023-10-17 MED ORDER — MAGNESIUM HYDROXIDE 400 MG/5ML PO SUSP: 30.0000 mL | Freq: Every day | ORAL | Status: DC | PRN

## 2023-10-17 MED ORDER — INSULIN ASPART 100 UNIT/ML IJ SOLN
0.0000 [IU] | Freq: Every day | INTRAMUSCULAR | Status: DC
  Administered 2023-10-17 (×2): 2 [IU] via SUBCUTANEOUS

## 2023-10-17 NOTE — Inpatient Diabetes Management (Signed)
 Inpatient Diabetes Program Recommendations  AACE/ADA: New Consensus Statement on Inpatient Glycemic Control   Target Ranges:  Prepandial:   less than 140 mg/dL      Peak postprandial:   less than 180 mg/dL (1-2 hours)      Critically ill patients:  140 - 180 mg/dL    Latest Reference Range & Units 10/17/23 02:06 10/17/23 02:41 10/17/23 03:13 10/17/23 04:09 10/17/23 05:10 10/17/23 06:08 10/17/23 07:09 10/17/23 07:57 10/17/23 08:59  Glucose-Capillary 70 - 99 mg/dL 463 (HH) 450 (HH) 629 (H) 301 (H) 256 (H) 224 (H) 209 (H) 181 (H) 170 (H)    Latest Reference Range & Units 10/17/23 00:17 10/17/23 00:52 10/17/23 01:26  Glucose-Capillary 70 - 99 mg/dL 409 (HH) >399 (HH) >399 (HH)    Latest Reference Range & Units 10/16/23 22:47 10/17/23 02:18 10/17/23 06:12  CO2 22 - 32 mmol/L 15 (L) 22 24  Glucose 70 - 99 mg/dL 238 (HH) 530 (H) 777 (H)  Anion gap 5 - 15  33 (H) 21 (H) 15    Latest Reference Range & Units 10/16/23 22:47 10/17/23 02:18 10/17/23 06:12  Beta-Hydroxybutyric Acid 0.05 - 0.27 mmol/L >8.00 (H) 6.69 (H) 3.87 (H)   Review of Glycemic Control  Diabetes history: DM1; does not make any insulin  at all; requires basal, correction, and meal coverage  Outpatient Diabetes medications: 70/30 30 units QAM, 70/30 15 units QPM Current orders for Inpatient glycemic control: IV insulin   Inpatient Diabetes Program Recommendations:    Insulin : Once provider is ready to transition from IV to SQ insulin , please consider ordering Semglee  12 units Q24H, CBGs Q4H, Novolog  0-6 units Q4H.   Outpatient DM: At time of discharge, please provide Rx for Dexcom G7 sensors [phone]).  NOTE: Patient with Type 1 DM presented to ED on 10/16/23 with nausea and vomiting and missed his insulin . Patient has Medicaid insurance and sees Dr. Zack Hall for PCP; last sen Dr. Shona on 06/30/23. Spoke with patient over the phone regarding DM. Patient reports that he is taking 70/30 30 units QAM, 70/30 15 units QPM over the past  few weeks. He states he had been taking 70/30 30 units BID but he was experiencing a lot of lows during the night so he cut back on evening dose of 70/30. Patient admits that he missed some insulin  dosages recently due to being sick.  Discussed that since he has Type 1 DM, if he is not taking any insulin  he will likely develop DKA. Discussed that importance of taking insulin  even during times of not eating; discussed that he needs to talk with PCP about insulin  dose he can take if he is not able to eat in order to prevent DKA.  Patient reports that he is not checking his glucose. He as supplies for glucose monitoring and also had Dexcom G7 sensors at home but  he does not use either one. Discussed importance of glucose monitoring and encouraged patient to start using the Dexcom G7 sensor so he could be alerted when glucose was less than 70 mg/dl via alarms on the CGM. Patient states he will start using the Dexcom G7 sensors; he is not sure how many he has at home.  Encouraged patient to make follow up appointment with Dr. Shona so he can get assistance with adjusting insulin  dosages if needed.  Patient reports he has plenty of insulin  at home.  Patient verbalized understanding of information and has no questions at this time.  Thanks, Earnie Gainer, RN, MSN, CDCES Diabetes  Coordinator Inpatient Diabetes Program [phone] (Team Pager from 8am to 5pm)

## 2023-10-17 NOTE — Assessment & Plan Note (Signed)
-   The patient was aggressively hydrated with IV lactated ringer  and follow BMP. - Will avoid nephrotoxins.

## 2023-10-17 NOTE — Assessment & Plan Note (Signed)
-

## 2023-10-17 NOTE — Assessment & Plan Note (Signed)
-   Will continue Neurontin .

## 2023-10-17 NOTE — Progress Notes (Signed)
 Transition of Care Department [facility] has reviewed patient and no other TOC needs have been identified at this time. We will continue to monitor patient advancement through interdisciplinary progression rounds. If new patient transition needs arise, please place a TOC consult.   10/17/23 0800  TOC Brief Assessment  Insurance and Status Reviewed  Patient has primary care physician Yes  Home environment has been reviewed From home.  Prior level of function: Independent.  Prior/Current Home Services No current home services  Social Drivers of Health Review SDOH reviewed no interventions necessary  Readmission risk has been reviewed Yes  Transition of care needs no transition of care needs at this time

## 2023-10-17 NOTE — Progress Notes (Signed)
 Patient seen and evaluated, chart reviewed, please see EMR for updated orders. Please see full H&P dictated by admitting physician Dr Lawence for same date of service.    Brief Summary:- 28 y.o. male with medical history significant for ADHD, type 1 diabetes mellitus, dyslipidemia, hypertension and polysubstance abuse admitted on 10/25/2023 with DKA  A/p 1)DKA--patient met DKA criteria on admission with a bicarb of 15, anion gap of 33, serum glucose of 761 and beta hydroxybutyric acid of >8 -Treat with aggressive IV fluids, IV insulin , glucose check, frequent BMP and beta hydroxybutyric acid checks per Endo tool protocol -May switch from LR/NS to D5 solution when glucose drops below 250, per Endo tool protocol --- DKA pathophysiology resolving with IV fluids and IV insulin  -- Okay to transition from IV insulin  to subcu   2)AKI----acute kidney injury due to dehydration in the setting of #1 above creatinine on admission= 2.58 ,  baseline creatinine =  1.0  ,  -creatinine is now=1.13  ,  Renal function normalized with--aggressive hydration --renally adjust medications, avoid nephrotoxic agents / dehydration  / hypotension   3)Hypokalemia--due to insulin  therapy -Replace and recheck  4)Peripheral/diabetic neuropathy--- continue Neurontin   5) polysubstance abuse--check UDS  - Patient seen and evaluated, chart reviewed, please see EMR for updated orders. Please see full H&P dictated by admitting physician Dr Lawence for same date of service.  - Total care time 43 minutes  Rendall Carwin, MD

## 2023-10-17 NOTE — Plan of Care (Signed)
  Problem: Clinical Measurements: Goal: Ability to maintain clinical measurements within normal limits will improve Outcome: Progressing Goal: Will remain free from infection Outcome: Progressing Goal: Diagnostic test results will improve Outcome: Progressing   Problem: Coping: Goal: Level of anxiety will decrease Outcome: Progressing   Problem: Elimination: Goal: Will not experience complications related to bowel motility Outcome: Progressing Goal: Will not experience complications related to urinary retention Outcome: Progressing   Problem: Pain Managment: Goal: General experience of comfort will improve and/or be controlled Outcome: Progressing   Problem: Safety: Goal: Ability to remain free from injury will improve Outcome: Progressing   Problem: Skin Integrity: Goal: Risk for impaired skin integrity will decrease Outcome: Progressing   Problem: Fluid Volume: Goal: Ability to maintain a balanced intake and output will improve Outcome: Progressing   Problem: Education: Goal: Knowledge of General Education information will improve Description: Including pain rating scale, medication(s)/side effects and non-pharmacologic comfort measures Outcome: Not Progressing   Problem: Health Behavior/Discharge Planning: Goal: Ability to manage health-related needs will improve Outcome: Not Progressing   Problem: Activity: Goal: Risk for activity intolerance will decrease Outcome: Not Progressing   Problem: Nutrition: Goal: Adequate nutrition will be maintained Outcome: Not Progressing   Problem: Education: Goal: Ability to describe self-care measures that may prevent or decrease complications (Diabetes Survival Skills Education) will improve Outcome: Not Progressing

## 2023-10-17 NOTE — Assessment & Plan Note (Signed)
-   This is likely the culprit for dehydration and AKI and subsequent DKA. - The patient will be aggressively hydrated with IV lactated ringer . - As needed antiemetics will be provided.

## 2023-10-17 NOTE — H&P (Addendum)
 [name]   PATIENT NAME: [name]    MR#:  [id number]  DATE OF BIRTH:  [birth year]  DATE OF ADMISSION:  10/16/2023  PRIMARY CARE PHYSICIAN: Shona Norleen PEDLAR, MD   Patient is coming from: Home  REQUESTING/REFERRING PHYSICIAN: Patsey Lot, MD  CHIEF COMPLAINT:   Chief Complaint  Patient presents with  . Nausea  . Emesis    HISTORY OF PRESENT ILLNESS:  Pedro [name] is a 28 y.o. male with medical history significant for ADHD, type 1 diabetes mellitus, dyslipidemia, hypertension and polysubstance abuse, who presented to the emergency room with acute onset of recurrent nausea and vomiting over the last couple of days with no bilious vomitus or hematemesis.  He denies any diarrhea or abdominal pain.  No fever or chills.  No dysuria or hematuria or flank pain.  He admitted to polyuria and polydipsia.  Blood glucose levels were elevated.  He missed insulin .  No chest pain or palpitations.  No cough or wheezing or hemoptysis.  No other bleeding diathesis.  ED Course: When the patient came to the ER, BP was 152/113 with heart rate of 140 and respiratory to 22 with otherwise normal vital signs.  Labs revealed a blood glucose of 761 and later 469 with a BUN of 75 and later 69 and creatinine 2.58 and later 2.24.  Beta hydroxybutyrate was more than 8 and later 6.69.  UA showed more than 500 glucose and 80 ketones.  CBC showed leukocytosis of 23.4 with neutrophilia and thrombocytosis. EKG as reviewed by me :  EKG showed sinus tachycardia with a rate of 128 with right atrial enlargement and right axis deviation. Imaging: None.  The patient was given 2 L bolus of IV lactated ringer  and was started on IV insulin  drip per DKA protocol.  He is admitted to a stepdown unit bed for further evaluation and management. PAST MEDICAL HISTORY:   Past Medical History:  Diagnosis Date  . Acute kidney injury (HCC) 08/19/2021  . ADHD (attention deficit hyperactivity disorder)   . AKI (acute  kidney injury) (HCC) 10/17/2014  . Diabetic ketoacidosis (HCC) 10/05/2013  . DKA, type 1 (HCC) 12/15/2013  . DM type 1 (diabetes mellitus, type 1) (HCC)    diagnosed age 72  . Gastroenteritis 02/16/2014  . Genital herpes simplex type 2 12/15/2013  . Herpes   . High cholesterol   . Hyperkalemia 10/15/2012  . Hypertension   . Noncompliance 10/15/2012  . Noncompliance with medications   . Polysubstance dependence (HCC) 07/22/2011  . SIRS (systemic inflammatory response syndrome) (HCC) 12/25/2021  . Substance induced mood disorder (HCC) 07/22/2011  . Thrombocytosis 08/19/2021  . Tobacco abuse 12/15/2013    PAST SURGICAL HISTORY:   Past Surgical History:  Procedure Laterality Date  . INTERCOSTAL NERVE BLOCK Right 12/10/2020   Procedure: INTERCOSTAL NERVE BLOCK;  Surgeon: Shyrl Linnie KIDD, MD;  Location: [facility] OR;  Service: Thoracic;  Laterality: Right;  . none    . PLEURADESIS Right 12/10/2020   Procedure: MECHANICAL PLEURADESIS;  Surgeon: Shyrl Linnie KIDD, MD;  Location: [facility] OR;  Service: Thoracic;  Laterality: Right;  . PLEURECTOMY Right 12/10/2020   Procedure: APICAL PLEURECTOMY;  Surgeon: Shyrl Linnie KIDD, MD;  Location: [facility] OR;  Service: Thoracic;  Laterality: Right;    SOCIAL HISTORY:   Social History   Tobacco Use  . Smoking status: Every Day    Current packs/day: 1.00    Average packs/day: 1 pack/day for 5.0 years (5.0 ttl pk-yrs)  Types: Cigarettes  . Smokeless tobacco: Never  Substance Use Topics  . Alcohol  use: No    FAMILY HISTORY:   Family History  Problem Relation Age of Onset  . Diabetes Mellitus I Paternal Grandmother   . Diabetes Mellitus I Paternal Aunt   . Anxiety disorder Mother   . Hypertension Mother   . Hypertension Father     DRUG ALLERGIES:   Allergies  Allergen Reactions  . Sulfa Antibiotics Hives, Dermatitis and Other (See Comments)    Fever    REVIEW OF SYSTEMS:   ROS As per history of present illness. All pertinent  systems were reviewed above. Constitutional, HEENT, cardiovascular, respiratory, GI, GU, musculoskeletal, neuro, psychiatric, endocrine, integumentary and hematologic systems were reviewed and are otherwise negative/unremarkable except for positive findings mentioned above in the HPI.   MEDICATIONS AT HOME:   Prior to Admission medications   Medication Sig Start Date End Date Taking? Authorizing Provider  ELDERBERRY PO Take 1 tablet by mouth daily as needed (immune support).    [provider]  gabapentin  (NEURONTIN ) 300 MG capsule Take 1 capsule (300 mg total) by mouth at bedtime. 07/01/21   Vicci Afton CROME, MD  insulin  NPH-regular Human (70-30) 100 UNIT/ML injection Inject 10 Units into the skin 2 (two) times daily with a meal. 02/14/22   Emokpae, Courage, MD  lamoTRIgine  (LAMICTAL ) 25 MG tablet Take 1 tablet (25 mg total) by mouth 2 (two) times daily. Patient not taking: Reported on 03/28/2023 02/14/22   Pearlean Manus, MD  losartan  (COZAAR ) 50 MG tablet Take 1 tablet (50 mg total) by mouth daily. 02/14/22 02/14/23  Pearlean Manus, MD  nicotine  (NICODERM CQ  - DOSED IN MG/24 HOURS) 14 mg/24hr patch Place 1 patch (14 mg total) onto the skin daily. Patient not taking: Reported on 03/28/2023 02/15/22   Pearlean Manus, MD      VITAL SIGNS:  Blood pressure (!) 172/104, pulse 98, temperature 97.7 F (36.5 C), temperature source Axillary, resp. rate 11, height 5' 11 (1.803 m), weight 59 kg, SpO2 100%.  PHYSICAL EXAMINATION:  Physical Exam  GENERAL: Acute ill lethargic 28 y.o.-year-old patient lying in the bed with no acute distress.  EYES: Pupils equal, round, reactive to light and accommodation. No scleral icterus. Extraocular muscles intact.  HEENT: Head atraumatic, normocephalic. Oropharynx with dry mucous membrane tongue and nasopharynx clear.  NECK:  Supple, no jugular venous distention. No thyroid enlargement, no tenderness.  LUNGS: Normal breath sounds bilaterally, no  wheezing, rales,rhonchi or crepitation. No use of accessory muscles of respiration.  CARDIOVASCULAR: Regular rate and rhythm, S1, S2 normal. No murmurs, rubs, or gallops.  ABDOMEN: Soft, nondistended, nontender. Bowel sounds present. No organomegaly or mass.  EXTREMITIES: No pedal edema, cyanosis, or clubbing.  NEUROLOGIC: Cranial nerves II through XII are intact. Muscle strength 5/5 in all extremities. Sensation intact. Gait not checked.  PSYCHIATRIC: The patient is alert and oriented x 3.  Normal affect and good eye contact. SKIN: No obvious rash, lesion, or ulcer.   LABORATORY PANEL:   CBC Recent Labs  Lab 10/16/23 2247  WBC 23.4*  HGB 17.1*  HCT 50.1  PLT 408*   ------------------------------------------------------------------------------------------------------------------  Chemistries  Recent Labs  Lab 10/16/23 2247 10/17/23 0218  NA 136 141  K 5.1 3.9  CL 88* 98  CO2 15* 22  GLUCOSE 761* 469*  BUN 75* 69*  CREATININE 2.58* 2.24*  CALCIUM 9.5 9.2  AST 21  --   ALT 28  --   ALKPHOS 130*  --  BILITOT 2.4*  --    ------------------------------------------------------------------------------------------------------------------  Cardiac Enzymes No results for input(s): TROPONINI in the last 168 hours. ------------------------------------------------------------------------------------------------------------------  RADIOLOGY:  No results found.    IMPRESSION AND PLAN:  Assessment and Plan: DKA (diabetic ketoacidosis) (HCC) - The patient will be admitted to a stepdown bed. - We will continue the on IV insulin  drip per EndoTool DKA protocol. - The patient will be aggressively hydrated with IV normal saline. - Will follow serial BMPs.   AKI (acute kidney injury) (HCC) - The patient was aggressively hydrated with IV lactated ringer  and follow BMP. - Will avoid nephrotoxins.  SIRS (systemic inflammatory response syndrome) (HCC) - This likely secondary  to #1. - Management as above. - The patient has no current evidence of infectious etiology.  Peripheral neuropathy - Will continue Neurontin .  Intractable nausea and vomiting - This is likely the culprit for dehydration and AKI and subsequent DKA. - The patient will be aggressively hydrated with IV lactated ringer . - As needed antiemetics will be provided.   DVT prophylaxis: Lovenox .  Advanced Care Planning:  Code Status: full code.  Family Communication:  The plan of care was discussed in details with the patient (and family). I answered all questions. The patient agreed to proceed with the above mentioned plan. Further management will depend upon hospital course. Disposition Plan: Back to previous home environment Consults called: none.  All the records are reviewed and case discussed with ED provider.  Status is: Inpatient  At the time of the admission, it appears that the appropriate admission status for this patient is inpatient.  This is judged to be reasonable and necessary in order to provide the required intensity of service to ensure the patient's safety given the presenting symptoms, physical exam findings and initial radiographic and laboratory data in the context of comorbid conditions.  The patient requires inpatient status due to high intensity of service, high risk of further deterioration and high frequency of surveillance required.  I certify that at the time of admission, it is my clinical judgment that the patient will require inpatient hospital care extending more than 2 midnights.                            Dispo: The patient is from: Home              Anticipated d/c is to: Home              Patient currently is not medically stable to d/c.              Difficult to place patient: No Authorized and performed by: Madison Peaches, MD Total critical care time:   50     minutes. Due to a high probability of clinically significant, life-threatening deterioration, the  patient required my highest level of preparedness to intervene emergently and I personally spent this critical care time directly and personally managing the patient.  This critical care time included obtaining a history, examining the patient, pulse oximetry, ordering and review of studies, arranging urgent treatment with development of management plan, evaluation of patient's response to treatment, frequent reassessment, and discussions with other providers. This critical care time was performed to assess and manage the high probability of imminent, life-threatening deterioration that could result in multiorgan failure.  It was exclusive of separately billable procedures and treating other patients and teaching time.   Madison DELENA Peaches M.D on 10/17/2023 at 5:19 AM  [facility]   From 7 PM-7 AM, contact night-coverage [email or web address]  CC: Primary care physician; Shona Norleen PEDLAR, MD

## 2023-10-17 NOTE — Assessment & Plan Note (Signed)
-   This likely secondary to #1. - Management as above. - The patient has no current evidence of infectious etiology.

## 2023-10-18 ENCOUNTER — Other Ambulatory Visit (HOSPITAL_COMMUNITY): Payer: Self-pay

## 2023-10-18 ENCOUNTER — Telehealth (HOSPITAL_COMMUNITY): Payer: Self-pay | Admitting: Pharmacy Technician

## 2023-10-18 DIAGNOSIS — E1010 Type 1 diabetes mellitus with ketoacidosis without coma: Secondary | ICD-10-CM | POA: Diagnosis not present

## 2023-10-18 DIAGNOSIS — R112 Nausea with vomiting, unspecified: Secondary | ICD-10-CM | POA: Diagnosis not present

## 2023-10-18 DIAGNOSIS — G6289 Other specified polyneuropathies: Secondary | ICD-10-CM

## 2023-10-18 DIAGNOSIS — N179 Acute kidney failure, unspecified: Secondary | ICD-10-CM | POA: Diagnosis not present

## 2023-10-18 LAB — COMPREHENSIVE METABOLIC PANEL WITH GFR
ALT: 18 U/L (ref 0–44)
AST: 17 U/L (ref 15–41)
Albumin: 2.8 g/dL — ABNORMAL LOW (ref 3.5–5.0)
Alkaline Phosphatase: 73 U/L (ref 38–126)
Anion gap: 11 (ref 5–15)
BUN: 32 mg/dL — ABNORMAL HIGH (ref 6–20)
CO2: 21 mmol/L — ABNORMAL LOW (ref 22–32)
Calcium: 7.9 mg/dL — ABNORMAL LOW (ref 8.9–10.3)
Chloride: 104 mmol/L (ref 98–111)
Creatinine, Ser: 1.06 mg/dL (ref 0.61–1.24)
GFR, Estimated: >60 mL/min (ref >60)
Glucose, Bld: 308 mg/dL — ABNORMAL HIGH (ref 70–99)
Potassium: 4.3 mmol/L (ref 3.5–5.1)
Sodium: 136 mmol/L (ref 135–145)
Total Bilirubin: 1.8 mg/dL — ABNORMAL HIGH (ref 0.0–1.2)
Total Protein: 5.3 g/dL — ABNORMAL LOW (ref 6.5–8.1)

## 2023-10-18 LAB — CBC
HCT: 40.9 % (ref 39.0–52.0)
Hemoglobin: 13.5 g/dL (ref 13.0–17.0)
MCH: 30.4 pg (ref 26.0–34.0)
MCHC: 33 g/dL (ref 30.0–36.0)
MCV: 92.1 fL (ref 80.0–100.0)
Platelets: 245 K/uL (ref 150–400)
RBC: 4.44 MIL/uL (ref 4.22–5.81)
RDW: 12.7 % (ref 11.5–15.5)
WBC: 14.9 K/uL — ABNORMAL HIGH (ref 4.0–10.5)
nRBC: 0 % (ref 0.0–0.2)

## 2023-10-18 LAB — GLUCOSE, CAPILLARY
Glucose-Capillary: 193 mg/dL — ABNORMAL HIGH (ref 70–99)
Glucose-Capillary: 200 mg/dL — ABNORMAL HIGH (ref 70–99)
Glucose-Capillary: 203 mg/dL — ABNORMAL HIGH (ref 70–99)
Glucose-Capillary: 383 mg/dL — ABNORMAL HIGH (ref 70–99)
Glucose-Capillary: 388 mg/dL — ABNORMAL HIGH (ref 70–99)
Glucose-Capillary: 447 mg/dL — ABNORMAL HIGH (ref 70–99)

## 2023-10-18 LAB — HEMOGLOBIN A1C
Hgb A1c MFr Bld: 12.8 % — ABNORMAL HIGH (ref 4.8–5.6)
Mean Plasma Glucose: 321 mg/dL

## 2023-10-18 MED ORDER — GABAPENTIN 300 MG PO CAPS: 300.0000 mg | ORAL_CAPSULE | Freq: Every day | ORAL | 5 refills | Status: DC

## 2023-10-18 MED ORDER — INSULIN ASPART 100 UNIT/ML IJ SOLN: 0.0000 [IU] | Freq: Three times a day (TID) | INTRAMUSCULAR | Status: DC

## 2023-10-18 MED ORDER — INSULIN ASPART 100 UNIT/ML IJ SOLN: 0.0000 [IU] | Freq: Every day | INTRAMUSCULAR | Status: DC

## 2023-10-18 MED ORDER — SODIUM CHLORIDE 0.9 % IV BOLUS
1000.0000 mL | Freq: Once | INTRAVENOUS | Status: AC
  Administered 2023-10-18 (×2): 1000 mL via INTRAVENOUS

## 2023-10-18 MED ORDER — INSULIN GLARGINE-YFGN 100 UNIT/ML ~~LOC~~ SOLN
15.0000 [IU] | Freq: Every day | SUBCUTANEOUS | Status: DC
  Administered 2023-10-18 (×2): 15 [IU] via SUBCUTANEOUS
  Filled 2023-10-18 (×2): qty 0.15

## 2023-10-18 MED ORDER — DEXCOM G7 SENSOR MISC: 1.0000 [IU] | 5 refills | Status: DC

## 2023-10-18 MED ORDER — INSULIN ASPART 100 UNIT/ML IJ SOLN
0.0000 [IU] | Freq: Three times a day (TID) | INTRAMUSCULAR | Status: DC
  Administered 2023-10-18 (×2): 20 [IU] via SUBCUTANEOUS

## 2023-10-18 MED ORDER — INSULIN NPH ISOPHANE & REGULAR (70-30) 100 UNIT/ML ~~LOC~~ SUSP: 30.0000 [IU] | Freq: Two times a day (BID) | SUBCUTANEOUS | 4 refills | Status: AC

## 2023-10-18 MED ORDER — INSULIN GLARGINE-YFGN 100 UNIT/ML ~~LOC~~ SOLN
12.0000 [IU] | Freq: Every day | SUBCUTANEOUS | Status: DC
  Filled 2023-10-18 (×2): qty 0.12

## 2023-10-18 MED ORDER — INSULIN ASPART 100 UNIT/ML IJ SOLN: 4.0000 [IU] | Freq: Three times a day (TID) | INTRAMUSCULAR | Status: DC

## 2023-10-18 NOTE — Discharge Summary (Signed)
 Pedro [name], is a 28 y.o. male  DOB 03/27/1995  MRN [id number].  Admission date:  10/16/2023  Admitting Physician  Pedro DELENA Peaches, MD  Discharge Date:  10/18/2023   Primary MD  Pedro Norleen PEDLAR, MD  Recommendations for primary care physician for things to follow:  1) avoid dehydration/drink plenty of fluids 2) please take 70/30 insulin  as prescribed 3) please use your Dexcom G7 sensor as advised to monitor your glucose/blood sugar 4) follow-up with PCP as previously scheduled  Admission Diagnosis  DKA (diabetic ketoacidosis) (HCC) [E11.10]  ischarge Diagnosis  DKA (diabetic ketoacidosis) (HCC) [E11.10]    Active Problems:   DKA (diabetic ketoacidosis) (HCC)   AKI (acute kidney injury) (HCC)   SIRS (systemic inflammatory response syndrome) (HCC)   Intractable nausea and vomiting   Peripheral neuropathy     Past Medical History:  Diagnosis Date   Acute kidney injury (HCC) 08/19/2021   ADHD (attention deficit hyperactivity disorder)    AKI (acute kidney injury) (HCC) 10/17/2014   Diabetic ketoacidosis (HCC) 10/05/2013   DKA, type 1 (HCC) 12/15/2013   DM type 1 (diabetes mellitus, type 1) (HCC)    diagnosed age 26   Gastroenteritis 02/16/2014   Genital herpes simplex type 2 12/15/2013   Herpes    High cholesterol    Hyperkalemia 10/15/2012   Hypertension    Noncompliance 10/15/2012   Noncompliance with medications    Polysubstance dependence (HCC) 07/22/2011   SIRS (systemic inflammatory response syndrome) (HCC) 12/25/2021   Substance induced mood disorder (HCC) 07/22/2011   Thrombocytosis 08/19/2021   Tobacco abuse 12/15/2013    Past Surgical History:  Procedure Laterality Date   INTERCOSTAL NERVE BLOCK Right 12/10/2020   Procedure: INTERCOSTAL NERVE BLOCK;  Surgeon: Pedro Linnie KIDD, MD;  Location: [facility] OR;  Service: Thoracic;  Laterality: Right;   none     PLEURADESIS Right 12/10/2020    Procedure: MECHANICAL PLEURADESIS;  Surgeon: [place] Linnie KIDD, MD;  Location: [facility] OR;  Service: Thoracic;  Laterality: Right;   PLEURECTOMY Right 12/10/2020   Procedure: APICAL PLEURECTOMY;  Surgeon: Pedro Linnie KIDD, MD;  Location: [facility] OR;  Service: Thoracic;  Laterality: Right;     HPI  from the history and physical done on the day of admission:    Pedro [name] is a 28 y.o. male with medical history significant for ADHD, type 1 diabetes mellitus, dyslipidemia, hypertension and polysubstance abuse, who presented to the emergency room with acute onset of recurrent nausea and vomiting over the last couple of days with no bilious vomitus or hematemesis.  He denies any diarrhea or abdominal pain.  No fever or chills.  No dysuria or hematuria or flank pain.  He admitted to polyuria and polydipsia.  Blood glucose levels were elevated.  He missed insulin .  No chest pain or palpitations.  No cough or wheezing or hemoptysis.  No other bleeding diathesis.   ED Course: When the patient came to the ER, BP was 152/113 with heart rate of 140 and respiratory to 22 with otherwise  normal vital signs.  Labs revealed a blood glucose of 761 and later 469 with a BUN of 75 and later 69 and creatinine 2.58 and later 2.24.  Beta hydroxybutyrate was more than 8 and later 6.69.  UA showed more than 500 glucose and 80 ketones.  CBC showed leukocytosis of 23.4 with neutrophilia and thrombocytosis. EKG as reviewed by me :  EKG showed sinus tachycardia with a rate of 128 with right atrial enlargement and right axis deviation. Imaging: None.   The patient was given 2 L bolus of IV lactated ringer  and was started on IV insulin  drip per DKA protocol.  He is admitted to a stepdown unit bed for further evaluation and management.  Hospital Course:   Brief Summary:- 28 y.o. male with medical history significant for ADHD, type 1 diabetes mellitus, dyslipidemia, hypertension and polysubstance abuse admitted on 10/25/2023 with  DKA   A/p 1)DKA--patient met DKA criteria on admission with a bicarb of 15, anion gap of 33, serum glucose of 761 and beta hydroxybutyric acid of >8 -Treat with aggressive IV fluids, IV insulin , glucose check, frequent BMP and beta hydroxybutyric acid checks per Endo tool protocol -May switch from LR/NS to D5 solution when glucose drops below 250, per Endo tool protocol -Serum glucose is down to 203 bicarb improved to 21, anion gap improved to 11 and beta hydroxybutyrate acid improved to 0.99 ----with this lab values patient received additional normal saline boluses x 2 L prior to discharge--- labs were not repeated for them and anticipate that bicarbonate better gently but because it would have improved further from these values after additional IV fluid boluses that patient received this morning --- DKA pathophysiology resolved with IV fluids and IV insulin  --  transitioned from IV insulin  to subcu insulin  THC use may be contributing to intractable emesis and making patient more likely to go into DKA -E-prescription for 70/30 insulin  and Dexcom device   2)AKI----acute kidney injury due to dehydration in the setting of #1 above creatinine on admission= 2.58 ,  baseline creatinine =  1.0  ,  -creatinine is now=1.0 ,  Renal function normalized with--aggressive hydration --renally adjust medications, avoid nephrotoxic agents / dehydration  / hypotension    3)Hypokalemia--due to insulin  therapy -Replaced and normalized   4)Peripheral/diabetic neuropathy--- continue Neurontin    5) polysubstance abuse--UDS with THC this may be contributing to intractable nausea and vomiting which is predisposing patient to episodes of DKA  Discharge Condition: stable  Follow UP   Follow-up Information     Pedro Norleen PEDLAR, MD. Schedule an appointment as soon as possible for a visit in 1 week(s).   Specialty: Internal Medicine Contact information: [address] [name] [address] [phone]                   Consults obtained -diabetic educator  Diet and Activity recommendation:  As advised  Discharge Instructions    Discharge Instructions     Call MD for:  difficulty breathing, headache or visual disturbances   Complete by: As directed    Call MD for:  persistant dizziness or light-headedness   Complete by: As directed    Call MD for:  persistant nausea and vomiting   Complete by: As directed    Call MD for:  temperature >100.4   Complete by: As directed    Diet Carb Modified   Complete by: As directed    Discharge instructions   Complete by: As directed    1)  avoid dehydration/drink plenty of fluids 2) please take 70/30 insulin  as prescribed 3) please use your Dexcom G7 sensor as advised to monitor your glucose/blood sugar 4) follow-up with PCP as previously scheduled   Increase activity slowly   Complete by: As directed         Discharge Medications     Allergies as of 10/18/2023       Reactions   Sulfa Antibiotics Hives, Dermatitis, Other (See Comments)   Fever        Medication List     STOP taking these medications    ibuprofen  600 MG tablet Commonly known as: ADVIL        TAKE these medications    Dexcom G7 Sensor Misc 1 Units by Does not apply route See admin instructions. Apply as directed to the skin--- change about every 10 days   ELDERBERRY PO Take 1 tablet by mouth daily as needed (immune support).   gabapentin  300 MG capsule Commonly known as: NEURONTIN  Take 1 capsule (300 mg total) by mouth at bedtime.   insulin  NPH-regular Human (70-30) 100 UNIT/ML injection Inject 30 Units into the skin 2 (two) times daily with a meal.       Major procedures and Radiology Reports - PLEASE review detailed and final reports for all details, in brief -    DG Chest 2 View Result Date: 09/20/2023 CLINICAL DATA:  Chest pain. Patient reports left anterior chest pain for 5 days. EXAM: CHEST - 2 VIEW COMPARISON:  Chest radiograph  02/12/2022, CT 12/09/2020 FINDINGS: The cardiomediastinal contours are normal. Apical blebs or emphysema. Chain sutures at the right lung apex. Pulmonary vasculature is normal. No consolidation, pleural effusion, or pneumothorax. No acute osseous abnormalities are seen. IMPRESSION: 1. No acute chest findings. 2. Apical blebs or emphysema. Electronically Signed   By: Andrea Gasman M.D.   On: 09/20/2023 18:08    Micro Results   Recent Results (from the past 240 hours)  MRSA Next Gen by PCR, Nasal     Status: None   Collection Time: 10/17/23  1:25 AM   Specimen: Nasal Mucosa; Nasal Swab  Result Value Ref Range Status   MRSA by PCR Next Gen NOT DETECTED NOT DETECTED Final    Comment: (NOTE) The GeneXpert MRSA Assay (FDA approved for NASAL specimens only), is one component of a comprehensive MRSA colonization surveillance program. It is not intended to diagnose MRSA infection nor to guide or monitor treatment for MRSA infections. Test performance is not FDA approved in patients less than 55 years old. Performed at [facility], [address] [place], [name] [address]     Today   Subjective    Pedro [name] today has no new complaints No fever  Or chills  - Eating and drinking well  No Nausea, Vomiting or Diarrhea   Patient has been seen and examined prior to discharge   Objective   Blood pressure (!) 159/91, pulse 72, temperature 97.7 F (36.5 C), temperature source Oral, resp. rate 11, height 5' 11 (1.803 m), weight 59 kg, SpO2 100%.   Intake/Output Summary (Last 24 hours) at 10/18/2023 1033 Last data filed at 10/18/2023 1000 Gross per 24 hour  Intake 4444.52 ml  Output 2400 ml  Net 2044.52 ml    Exam Gen:- Awake Alert, no acute distress  HEENT:- [place].AT, No sclera icterus Neck-Supple Neck,No JVD,.  Lungs-  CTAB , good air movement bilaterally CV- S1, S2 normal, regular Abd-  +ve B.Sounds, Abd Soft, No tenderness,  Extremity/Skin:- No  edema,   good  pulses Psych-affect is appropriate, oriented x3 Neuro-no new focal deficits, no tremors    Data Review   CBC w Diff:  Lab Results  Component Value Date   WBC 14.9 (H) 10/18/2023   HGB 13.5 10/18/2023   HGB 13.8 08/03/2018   HCT 40.9 10/18/2023   HCT 40.1 08/03/2018   PLT 245 10/18/2023   PLT 213 08/03/2018   LYMPHOPCT 8 10/16/2023   BANDSPCT 0 10/27/2013   MONOPCT 5 10/16/2023   EOSPCT 0 10/16/2023   BASOPCT 0 10/16/2023   CMP:  Lab Results  Component Value Date   NA 136 10/18/2023   NA 141 08/03/2018   K 4.3 10/18/2023   CL 104 10/18/2023   CO2 21 (L) 10/18/2023   BUN 32 (H) 10/18/2023   BUN 24 (H) 08/03/2018   CREATININE 1.06 10/18/2023   PROT 5.3 (L) 10/18/2023   PROT 5.9 (L) 08/03/2018   ALBUMIN 2.8 (L) 10/18/2023   ALBUMIN 3.9 (L) 08/03/2018   BILITOT 1.8 (H) 10/18/2023   BILITOT 0.3 08/03/2018   ALKPHOS 73 10/18/2023   AST 17 10/18/2023   ALT 18 10/18/2023   Total Discharge time is about 33 minutes  [name] M.D on 10/18/2023 at 10:33 AM  Go to [email or web address] -  for contact info  [facility] - Office  [phone]

## 2023-10-18 NOTE — Telephone Encounter (Signed)
 Pharmacy Patient Advocate Encounter   Received notification from Fax that prior authorization for Dexcom G7 Sensor  is required/requested.   Insurance verification completed.   The patient is insured through [facility] [place] .   Per test claim: PA required; PA submitted to above mentioned insurance via Latent Key/confirmation #/EOC [id number] Status is pending

## 2023-10-18 NOTE — Inpatient Diabetes Management (Addendum)
 Inpatient Diabetes Program Recommendations  AACE/ADA: New Consensus Statement on Inpatient Glycemic Control   Target Ranges:  Prepandial:   less than 140 mg/dL      Peak postprandial:   less than 180 mg/dL (1-2 hours)      Critically ill patients:  140 - 180 mg/dL    Latest Reference Range & Units 10/18/23 00:02 10/18/23 04:15 10/18/23 06:39 10/18/23 07:21  Glucose-Capillary 70 - 99 mg/dL 806 (H) 611 (H) 552 (H) 383 (H)    Latest Reference Range & Units 10/17/23 16:00 10/17/23 16:58 10/17/23 17:58 10/17/23 18:57 10/17/23 19:58 10/17/23 20:57 10/17/23 21:59  Glucose-Capillary 70 - 99 mg/dL 814 (H) 801 (H) 806 (H) 201 (H) 199 (H) 162 (H) 216 (H)   Review of Glycemic Control  Diabetes history: DM1; does not make any insulin  at all; requires basal, correction, and meal coverage  Outpatient Diabetes medications: 70/30 30 units QAM, 70/30 15 units QPM Current orders for Inpatient glycemic control: Semglee  12 units daily, Novolog  0-20 units AC&HS   Inpatient Diabetes Program Recommendations:     Insulin : Please consider increasing Semglee  to 15 units daily, decreasing Novolog  correction to 0-15 units AC&HS, and ordering Novolog  4 units TID with meals for meal coverage if patient eats at least 50% of meals.    Outpatient DM: At time of discharge, please provide Rx for Dexcom G7 sensors [phone]).   Thanks, Earnie Gainer, RN, MSN, CDCES Diabetes Coordinator Inpatient Diabetes Program [phone] (Team Pager from 8am to 5pm)

## 2023-10-18 NOTE — Telephone Encounter (Signed)
 Pharmacy Patient Advocate Encounter  Received notification from [facility] Medicaid that Prior Authorization for Dexcom G7 Sensor  has been APPROVED from 10/18/2023 to 04/15/2024   PA #/Case ID/Reference #: [id number]

## 2023-10-18 NOTE — Discharge Instructions (Signed)
 1) avoid dehydration/drink plenty of fluids 2) please take 70/30 insulin  as prescribed 3) please use your Dexcom G7 sensor as advised to monitor your glucose/blood sugar 4) follow-up with PCP as previously scheduled

## 2023-10-18 NOTE — Plan of Care (Signed)

## 2023-10-24 ENCOUNTER — Encounter: Payer: Self-pay | Admitting: Internal Medicine

## 2023-10-24 ENCOUNTER — Ambulatory Visit (INDEPENDENT_AMBULATORY_CARE_PROVIDER_SITE_OTHER): Admitting: Internal Medicine

## 2023-10-24 VITALS — BP 126/80 | HR 96 | Ht 71.0 in | Wt 135.4 lb

## 2023-10-24 DIAGNOSIS — F17210 Nicotine dependence, cigarettes, uncomplicated: Secondary | ICD-10-CM

## 2023-10-24 DIAGNOSIS — J449 Chronic obstructive pulmonary disease, unspecified: Secondary | ICD-10-CM | POA: Diagnosis not present

## 2023-10-24 NOTE — Assessment & Plan Note (Addendum)
 Active smoker  - R spont ptx 12/2020:   Wedge resection of the right upper lob with R Apical pleurectomy - Mechanical pleurodesis Alpha one phenotype 10/24/2023 >>>   He has clinical finding of mild copd and may have early clubbing as well at age 28 so needs baseline pfts and avoid further smoking/ valsalva maneuvers or extremes of altitude or skuba diving / advised   Pulmonary f/u can be prn .

## 2023-10-24 NOTE — Assessment & Plan Note (Addendum)
 Counseled re importance of smoking cessation but did not meet time criteria for separate billing    Each maintenance medication was reviewed in detail including emphasizing most importantly the difference between maintenance and prns and under what circumstances the prns are to be triggered using an action plan format where appropriate.  Total time for H and P, chart review, counseling,  and generating customized AVS unique to this office visit / same day charting = 45 m new pt eval

## 2023-10-24 NOTE — Progress Notes (Signed)
 Pedro [name], male    DOB: [birth year]    MRN: [id number]   Brief patient profile:  28  yowm  active smoker with AODM I   referred to pulmonary clinic in [place]  10/24/2023 by [facility]  for abnormal cxr s/p spon ptx R side > 12/2020:   Robotic assisted right video thoracoscopy - Wedge resection of the right upper lobe - Apical pleurectomy - Mechanical pleurodesis - Intercostal nerve block  Pt not previously seen by PCCM service.     History of Present Illness  10/24/2023  Pulmonary/ 1st office eval/ [facility] / [facility] / no maint rx/ still smoking  Chief Complaint  Patient presents with   Establish Care    Abn ct in hospital   Dyspnea:  ok activity tol  x for running  Cough: smoker's rattle clear mucus/ / worse in am resolves p 10 min  Sleep: flat bed / one pillow  SABA use: none  now  02: none     No obvious day to day or daytime pattern/variability or assoc purulent ssputum or mucus plugs or hemoptysis or cp or chest tightness, subjective wheeze or overt sinus or hb symptoms.    Also denies any obvious fluctuation of symptoms with weather or environmental changes or other aggravating or alleviating factors except as outlined above   No unusual exposure hx or h/o childhood pna/ asthma or knowledge of premature birth.  Current Allergies, Complete Past Medical History, Past Surgical History, Family History, and Social History were reviewed in [employer] record.  ROS  The following are not active complaints unless bolded Hoarseness, sore throat, dysphagia, dental problems, itching, sneezing,  nasal congestion or discharge of excess mucus or purulent secretions, ear ache,   fever, chills, sweats, unintended wt loss or wt gain, classically pleuritic or exertional cp,  orthopnea pnd or arm/hand swelling  or leg swelling, presyncope, palpitations, abdominal pain, anorexia, nausea, vomiting, diarrhea  or change in bowel habits or change in bladder  habits, change in stools or change in urine, dysuria, hematuria,  rash, arthralgias, visual complaints, headache, numbness, weakness or ataxia or problems with walking or coordination,  change in mood or  memory.            Outpatient Medications Prior to Visit  Medication Sig Dispense Refill   gabapentin  (NEURONTIN ) 300 MG capsule Take 1 capsule (300 mg total) by mouth at bedtime. 30 capsule 5   insulin  NPH-regular Human (70-30) 100 UNIT/ML injection Inject 30 Units into the skin 2 (two) times daily with a meal. 10 mL 4   Continuous Glucose Sensor (DEXCOM G7 SENSOR) MISC 1 Units by Does not apply route See admin instructions. Apply as directed to the skin--- change about every 10 days (Patient not taking: Reported on 10/24/2023) 4 each 5   ELDERBERRY PO Take 1 tablet by mouth daily as needed (immune support). (Patient not taking: Reported on 10/24/2023)     No facility-administered medications prior to visit.    Past Medical History:  Diagnosis Date   Acute kidney injury (HCC) 08/19/2021   ADHD (attention deficit hyperactivity disorder)    AKI (acute kidney injury) (HCC) 10/17/2014   Diabetic ketoacidosis (HCC) 10/05/2013   DKA, type 1 (HCC) 12/15/2013   DM type 1 (diabetes mellitus, type 1) (HCC)    diagnosed age 77   Gastroenteritis 02/16/2014   Genital herpes simplex type 2 12/15/2013   Herpes    High cholesterol    Hyperkalemia 10/15/2012  Hypertension    Lung abnormality    Noncompliance 10/15/2012   Noncompliance with medications    Polysubstance dependence (HCC) 07/22/2011   SIRS (systemic inflammatory response syndrome) (HCC) 12/25/2021   Substance induced mood disorder (HCC) 07/22/2011   Thrombocytosis 08/19/2021   Tobacco abuse 12/15/2013      Objective:     BP 126/80   Pulse 96   Ht 5' 11 (1.803 m)   Wt 135 lb 6.4 oz (61.4 kg)   SpO2 100% Comment: ra  BMI 18.88 kg/m   SpO2: 100 % (ra) somber thin wm nad    HEENT : Oropharynx  clear   Nasal turbinates  nl    NECK :  without  apparent JVD/ palpable Nodes/TM    LUNGS: no acc muscle use,  Min barrel  contour chest wall with bilateral  slightly decreased bs s audible wheeze and  without cough on insp or exp maneuvers and min  Hyperresonant  to  percussion bilaterally    CV:  RRR  no s3 or murmur or increase in P2, and no edema   ABD:  soft and nontender with pos end  insp Hoover's  in the supine position.  No bruits or organomegaly appreciated   MS:  Nl gait/ ext warm without deformities Or obvious joint restrictions  calf tenderness, cyanosis  ? Very mild clubbing     SKIN: warm and dry without lesions    NEURO:  alert, approp, nl sensorium with  no motor or cerebellar deficits apparent.        I personally reviewed images and agree with radiology impression as follows:  CXR:   pa and lateral Apical blebs vs emphysematous changes s ptx      Assessment      Assessment & Plan COPD GOLD ? / bullous dz on cxr Active smoker  - R spont ptx 12/2020:   Wedge resection of the right upper lob with R Apical pleurectomy - Mechanical pleurodesis Alpha one phenotype 10/24/2023 >>>   He has clinical finding of mild copd and may have early clubbing as well at age 58 so needs baseline pfts and avoid further smoking/ valsalva maneuvers or extremes of altitude or skuba diving / advised   Pulmonary f/u can be prn .   Cigarette smoker Counseled re importance of smoking cessation but did not meet time criteria for separate billing    Each maintenance medication was reviewed in detail including emphasizing most importantly the difference between maintenance and prns and under what circumstances the prns are to be triggered using an action plan format where appropriate.  Total time for H and P, chart review, counseling,  and generating customized AVS unique to this office visit / same day charting = 45 m new pt eval          Patient Instructions  The key is to stop smoking completely before  smoking completely stops you!  Please remember to go to the lab department   for your tests - we will call you with the results when they are available.    My office will be contacting you by phone for referral to PFT s at [facility]   - if you don't hear back from my office within one week please call us  back or notify us  thru MyChart and we'll address it right away.   Pulmonary follow up is as needed         Pedro America, MD 10/24/2023

## 2023-10-24 NOTE — Patient Instructions (Addendum)
 The key is to stop smoking completely before smoking completely stops you!  Please remember to go to the lab department   for your tests - we will call you with the results when they are available.    My office will be contacting you by phone for referral to PFT s at [facility]   - if you don't hear back from my office within one week please call us  back or notify us  thru MyChart and we'll address it right away.   Pulmonary follow up is as needed

## 2023-10-25 ENCOUNTER — Telehealth: Payer: Self-pay | Admitting: Internal Medicine

## 2023-10-25 NOTE — Telephone Encounter (Signed)
 Spoke with patient regarding the 12/07/23 3:00pm scheduled PFT at [facility] time is 2:45 pm--1st floor registration desk for check in--will mail information to patient and he voiced his understanding

## 2023-10-26 LAB — ALPHA-1-ANTITRYPSIN PHENOTYP: A-1 Antitrypsin: 116 mg/dL (ref 95–164)

## 2023-10-29 ENCOUNTER — Ambulatory Visit: Payer: Self-pay | Admitting: Internal Medicine

## 2023-10-31 NOTE — Telephone Encounter (Signed)
 Atc x1 no vm set up

## 2023-11-01 NOTE — Progress Notes (Signed)
 ATC x2 no vm set up - sending letter and closing enct.

## 2023-11-03 ENCOUNTER — Ambulatory Visit: Admitting: Internal Medicine

## 2023-11-17 DIAGNOSIS — Z419 Encounter for procedure for purposes other than remedying health state, unspecified: Secondary | ICD-10-CM | POA: Diagnosis not present

## 2023-12-07 ENCOUNTER — Ambulatory Visit (HOSPITAL_COMMUNITY): Admission: RE | Admit: 2023-12-07 | Source: Ambulatory Visit

## 2023-12-17 DIAGNOSIS — Z419 Encounter for procedure for purposes other than remedying health state, unspecified: Secondary | ICD-10-CM | POA: Diagnosis not present

## 2024-01-07 ENCOUNTER — Inpatient Hospital Stay (HOSPITAL_COMMUNITY)
Admission: EM | Admit: 2024-01-07 | Discharge: 2024-01-09 | DRG: 638 | Disposition: A | Discharge status: Home / Self Care | Attending: Internal Medicine | Admitting: Internal Medicine

## 2024-01-07 ENCOUNTER — Encounter (HOSPITAL_COMMUNITY): Payer: Self-pay | Admitting: Emergency Medicine

## 2024-01-07 DIAGNOSIS — E875 Hyperkalemia: Secondary | ICD-10-CM | POA: Diagnosis present

## 2024-01-07 DIAGNOSIS — Z91148 Patient's other noncompliance with medication regimen for other reason: Secondary | ICD-10-CM

## 2024-01-07 DIAGNOSIS — E871 Hypo-osmolality and hyponatremia: Secondary | ICD-10-CM | POA: Diagnosis present

## 2024-01-07 DIAGNOSIS — E1042 Type 1 diabetes mellitus with diabetic polyneuropathy: Secondary | ICD-10-CM | POA: Diagnosis present

## 2024-01-07 DIAGNOSIS — E1110 Type 2 diabetes mellitus with ketoacidosis without coma: Secondary | ICD-10-CM | POA: Diagnosis present

## 2024-01-07 DIAGNOSIS — E860 Dehydration: Secondary | ICD-10-CM | POA: Diagnosis present

## 2024-01-07 DIAGNOSIS — F1910 Other psychoactive substance abuse, uncomplicated: Secondary | ICD-10-CM

## 2024-01-07 DIAGNOSIS — Z882 Allergy status to sulfonamides status: Secondary | ICD-10-CM

## 2024-01-07 DIAGNOSIS — F1210 Cannabis abuse, uncomplicated: Secondary | ICD-10-CM | POA: Diagnosis present

## 2024-01-07 DIAGNOSIS — F17210 Nicotine dependence, cigarettes, uncomplicated: Secondary | ICD-10-CM | POA: Diagnosis present

## 2024-01-07 DIAGNOSIS — K3184 Gastroparesis: Secondary | ICD-10-CM | POA: Diagnosis present

## 2024-01-07 DIAGNOSIS — E1043 Type 1 diabetes mellitus with diabetic autonomic (poly)neuropathy: Secondary | ICD-10-CM | POA: Diagnosis present

## 2024-01-07 DIAGNOSIS — F1410 Cocaine abuse, uncomplicated: Secondary | ICD-10-CM | POA: Diagnosis present

## 2024-01-07 DIAGNOSIS — E1010 Type 1 diabetes mellitus with ketoacidosis without coma: Secondary | ICD-10-CM | POA: Diagnosis not present

## 2024-01-07 DIAGNOSIS — N179 Acute kidney failure, unspecified: Secondary | ICD-10-CM | POA: Diagnosis present

## 2024-01-07 DIAGNOSIS — R7989 Other specified abnormal findings of blood chemistry: Secondary | ICD-10-CM | POA: Diagnosis not present

## 2024-01-07 DIAGNOSIS — Z833 Family history of diabetes mellitus: Secondary | ICD-10-CM

## 2024-01-07 DIAGNOSIS — T383X6A Underdosing of insulin and oral hypoglycemic [antidiabetic] drugs, initial encounter: Secondary | ICD-10-CM | POA: Diagnosis present

## 2024-01-07 DIAGNOSIS — E0810 Diabetes mellitus due to underlying condition with ketoacidosis without coma: Principal | ICD-10-CM

## 2024-01-07 DIAGNOSIS — Z8249 Family history of ischemic heart disease and other diseases of the circulatory system: Secondary | ICD-10-CM

## 2024-01-07 DIAGNOSIS — Z818 Family history of other mental and behavioral disorders: Secondary | ICD-10-CM

## 2024-01-07 DIAGNOSIS — Z794 Long term (current) use of insulin: Secondary | ICD-10-CM

## 2024-01-07 DIAGNOSIS — I10 Essential (primary) hypertension: Secondary | ICD-10-CM | POA: Diagnosis not present

## 2024-01-07 DIAGNOSIS — Z79899 Other long term (current) drug therapy: Secondary | ICD-10-CM

## 2024-01-07 DIAGNOSIS — E7800 Pure hypercholesterolemia, unspecified: Secondary | ICD-10-CM | POA: Diagnosis present

## 2024-01-07 DIAGNOSIS — F1920 Other psychoactive substance dependence, uncomplicated: Secondary | ICD-10-CM | POA: Diagnosis present

## 2024-01-07 LAB — URINE DRUG SCREEN
Amphetamines: NEGATIVE
Barbiturates: NEGATIVE
Benzodiazepines: NEGATIVE
Cocaine: POSITIVE — AB
Fentanyl: POSITIVE — AB
Methadone Scn, Ur: NEGATIVE
Opiates: NEGATIVE
Tetrahydrocannabinol: POSITIVE — AB

## 2024-01-07 LAB — URINALYSIS, ROUTINE W REFLEX MICROSCOPIC
Bacteria, UA: NONE SEEN
Bilirubin Urine: NEGATIVE
Glucose, UA: >=500 mg/dL — AB
Hgb urine dipstick: NEGATIVE
Ketones, ur: 20 mg/dL — AB
Leukocytes,Ua: NEGATIVE
Nitrite: NEGATIVE
Protein, ur: 30 mg/dL — AB
Specific Gravity, Urine: 1.018 (ref 1.005–1.030)
pH: 6 (ref 5.0–8.0)

## 2024-01-07 LAB — CBC WITH DIFFERENTIAL/PLATELET
Abs Immature Granulocytes: 0.03 K/uL (ref 0.00–0.07)
Basophils Absolute: 0.1 K/uL (ref 0.0–0.1)
Basophils Relative: 1 %
Eosinophils Absolute: 0 K/uL (ref 0.0–0.5)
Eosinophils Relative: 1 %
HCT: 47.6 % (ref 39.0–52.0)
Hemoglobin: 15.2 g/dL (ref 13.0–17.0)
Immature Granulocytes: 0 %
Lymphocytes Relative: 25 %
Lymphs Abs: 1.9 K/uL (ref 0.7–4.0)
MCH: 30.7 pg (ref 26.0–34.0)
MCHC: 31.9 g/dL (ref 30.0–36.0)
MCV: 96.2 fL (ref 80.0–100.0)
Monocytes Absolute: 0.3 K/uL (ref 0.1–1.0)
Monocytes Relative: 4 %
Neutro Abs: 5.4 K/uL (ref 1.7–7.7)
Neutrophils Relative %: 69 %
Platelets: 289 K/uL (ref 150–400)
RBC: 4.95 MIL/uL (ref 4.22–5.81)
RDW: 13.2 % (ref 11.5–15.5)
WBC: 7.7 K/uL (ref 4.0–10.5)
nRBC: 0 % (ref 0.0–0.2)

## 2024-01-07 LAB — COMPREHENSIVE METABOLIC PANEL WITH GFR
ALT: 22 U/L (ref 0–44)
AST: 22 U/L (ref 15–41)
Albumin: 3.9 g/dL (ref 3.5–5.0)
Alkaline Phosphatase: 97 U/L (ref 38–126)
Anion gap: 20 — ABNORMAL HIGH (ref 5–15)
BUN: 42 mg/dL — ABNORMAL HIGH (ref 6–20)
CO2: 18 mmol/L — ABNORMAL LOW (ref 22–32)
Calcium: 8.8 mg/dL — ABNORMAL LOW (ref 8.9–10.3)
Chloride: 82 mmol/L — ABNORMAL LOW (ref 98–111)
Creatinine, Ser: 1.88 mg/dL — ABNORMAL HIGH (ref 0.61–1.24)
GFR, Estimated: 49 mL/min — ABNORMAL LOW (ref >60)
Glucose, Bld: 1148 mg/dL (ref 70–99)
Potassium: 5.9 mmol/L — ABNORMAL HIGH (ref 3.5–5.1)
Sodium: 120 mmol/L — ABNORMAL LOW (ref 135–145)
Total Bilirubin: 1.2 mg/dL (ref 0.0–1.2)
Total Protein: 6.3 g/dL — ABNORMAL LOW (ref 6.5–8.1)

## 2024-01-07 LAB — GLUCOSE, CAPILLARY: Glucose-Capillary: >600 mg/dL (ref 70–99)

## 2024-01-07 LAB — BLOOD GAS, VENOUS
Acid-base deficit: 10.6 mmol/L — ABNORMAL HIGH (ref 0.0–2.0)
Bicarbonate: 15.8 mmol/L — ABNORMAL LOW (ref 20.0–28.0)
Drawn by: 7049
O2 Saturation: 66.4 %
Patient temperature: 36.6
pCO2, Ven: 35 mmHg — ABNORMAL LOW (ref 44–60)
pH, Ven: 7.26 (ref 7.25–7.43)
pO2, Ven: <31 mmHg — CL (ref 32–45)

## 2024-01-07 LAB — I-STAT CHEM 8, ED
BUN: 41 mg/dL — ABNORMAL HIGH (ref 6–20)
Calcium, Ion: 1.17 mmol/L (ref 1.15–1.40)
Chloride: 90 mmol/L — ABNORMAL LOW (ref 98–111)
Creatinine, Ser: 1.8 mg/dL — ABNORMAL HIGH (ref 0.61–1.24)
Glucose, Bld: >700 mg/dL (ref 70–99)
HCT: 48 % (ref 39.0–52.0)
Hemoglobin: 16.3 g/dL (ref 13.0–17.0)
Potassium: 5.9 mmol/L — ABNORMAL HIGH (ref 3.5–5.1)
Sodium: 123 mmol/L — ABNORMAL LOW (ref 135–145)
TCO2: 18 mmol/L — ABNORMAL LOW (ref 22–32)

## 2024-01-07 LAB — CBG MONITORING, ED
Glucose-Capillary: >600 mg/dL (ref 70–99)
Glucose-Capillary: >600 mg/dL (ref 70–99)
Glucose-Capillary: >600 mg/dL (ref 70–99)

## 2024-01-07 LAB — ETHANOL: Alcohol, Ethyl (B): <15 mg/dL (ref <15)

## 2024-01-07 MED ORDER — LACTATED RINGERS IV BOLUS
20.0000 mL/kg | Freq: Once | INTRAVENOUS | Status: AC
  Administered 2024-01-07: 1316 mL via INTRAVENOUS

## 2024-01-07 MED ORDER — SODIUM CHLORIDE 0.9 % IV BOLUS
1000.0000 mL | Freq: Once | INTRAVENOUS | Status: AC
  Administered 2024-01-07: 1000 mL via INTRAVENOUS

## 2024-01-07 MED ORDER — DEXTROSE IN LACTATED RINGERS 5 % IV SOLN: INTRAVENOUS | Status: DC

## 2024-01-07 MED ORDER — INSULIN REGULAR(HUMAN) IN NACL 100-0.9 UT/100ML-% IV SOLN
INTRAVENOUS | Status: DC
  Administered 2024-01-07: 7 [IU]/h via INTRAVENOUS
  Filled 2024-01-07: qty 100

## 2024-01-07 MED ORDER — DEXTROSE 50 % IV SOLN: 0.0000 mL | INTRAVENOUS | Status: DC | PRN

## 2024-01-07 MED ORDER — CHLORHEXIDINE GLUCONATE CLOTH 2 % EX PADS
6.0000 | MEDICATED_PAD | Freq: Every day | CUTANEOUS | Status: DC
  Administered 2024-01-08: 6 via TOPICAL

## 2024-01-07 MED ORDER — LACTATED RINGERS IV SOLN: INTRAVENOUS | Status: AC

## 2024-01-07 NOTE — H&P (Signed)
 History and Physical    Patient: Pedro [name] FMW:[id number] DOB: [birth year] DOA: 01/07/2024 DOS: the patient was seen and examined on 01/07/2024 PCP: Shona Norleen PEDLAR, MD  Patient coming from: {Point_of_Origin:[address]}  Chief Complaint:  Chief Complaint  Patient presents with   Hyperglycemia   HPI: Pedro [name] is a 28 y.o. male with medical history significant of ***  Review of Systems: {ROS_Text:26778} Past Medical History:  Diagnosis Date   Acute kidney injury 08/19/2021   ADHD (attention deficit hyperactivity disorder)    AKI (acute kidney injury) 10/17/2014   Diabetic ketoacidosis (HCC) 10/05/2013   DKA, type 1 (HCC) 12/15/2013   DM type 1 (diabetes mellitus, type 1) (HCC)    diagnosed age 10   Gastroenteritis 02/16/2014   Genital herpes simplex type 2 12/15/2013   Herpes    High cholesterol    Hyperkalemia 10/15/2012   Hypertension    Lung abnormality    Noncompliance 10/15/2012   Noncompliance with medications    Polysubstance dependence (HCC) 07/22/2011   SIRS (systemic inflammatory response syndrome) (HCC) 12/25/2021   Substance induced mood disorder (HCC) 07/22/2011   Thrombocytosis 08/19/2021   Tobacco abuse 12/15/2013   Past Surgical History:  Procedure Laterality Date   INTERCOSTAL NERVE BLOCK Right 12/10/2020   Procedure: INTERCOSTAL NERVE BLOCK;  Surgeon: Shyrl Linnie KIDD, MD;  Location: [facility] OR;  Service: Thoracic;  Laterality: Right;   none     PLEURADESIS Right 12/10/2020   Procedure: MECHANICAL PLEURADESIS;  Surgeon: Shyrl Linnie KIDD, MD;  Location: [facility] OR;  Service: Thoracic;  Laterality: Right;   PLEURECTOMY Right 12/10/2020   Procedure: APICAL PLEURECTOMY;  Surgeon: Shyrl Linnie KIDD, MD;  Location: [facility] OR;  Service: Thoracic;  Laterality: Right;   Social History:  reports that he has been smoking cigarettes. He has a 5 pack-year smoking history. He has never used smokeless tobacco. He reports that he does not currently use drugs after  having used the following drugs: Marijuana, Other-see comments, Benzodiazepines, and Cocaine. He reports that he does not drink alcohol .  Allergies  Allergen Reactions   Sulfa Antibiotics Hives, Dermatitis and Other (See Comments)    Fever    Family History  Problem Relation Age of Onset   Diabetes Mellitus I Paternal Grandmother    Diabetes Mellitus I Paternal Aunt    Anxiety disorder Mother    Hypertension Mother    Hypertension Father     Prior to Admission medications   Medication Sig Start Date End Date Taking? Authorizing Provider  Continuous Glucose Sensor (DEXCOM G7 SENSOR) MISC 1 Units by Does not apply route See admin instructions. Apply as directed to the skin--- change about every 10 days Patient not taking: Reported on 10/24/2023 10/18/23   Pearlean Manus, MD  ELDERBERRY PO Take 1 tablet by mouth daily as needed (immune support). Patient not taking: Reported on 10/24/2023    [provider]  gabapentin  (NEURONTIN ) 300 MG capsule Take 1 capsule (300 mg total) by mouth at bedtime. 10/18/23   Pearlean Manus, MD  insulin  NPH-regular Human (70-30) 100 UNIT/ML injection Inject 30 Units into the skin 2 (two) times daily with a meal. 10/18/23   Pearlean Manus, MD    Physical Exam: Vitals:   01/07/24 2044 01/07/24 2145  BP: (!) 152/103   Pulse: 89   Resp: 18   Temp: 97.8 F (36.6 C)   SpO2: 100%   Weight:  65.8 kg   *** Data Reviewed: {Tip this will not be part of the  note when signed- Document your independent interpretation of telemetry tracing, EKG, lab, Radiology test or any other diagnostic tests. Add any new diagnostic test ordered today. (Optional):26781} {Results:26384}  Assessment and Plan: No notes have been filed under this hospital service. Service: Hospitalist     Advance Care Planning:   Code Status: Prior ***  Consults: ***  Family Communication: ***  Severity of Illness: {Observation/Inpatient:21159}  Author: Posey Maier,  DO 01/07/2024 10:02 PM  For on call review [email or web address].

## 2024-01-07 NOTE — ED Triage Notes (Signed)
 Pt here from home with c/o hyperglycemia , pt states that he has been taking his meds but family doesn't seem to think that he has been , pt said it read high , pt also admits to doing cocaine, last use today

## 2024-01-07 NOTE — ED Provider Notes (Signed)
 [place] EMERGENCY DEPARTMENT AT [facility] Provider Note   CSN: [id number] Arrival date & time: 01/07/24  2020     Patient presents with: Hyperglycemia   Pedro [name] is a 28 y.o. male.  {Add pertinent medical, surgical, social history, OB history to YEP:67052} Patient with a history of type 2 diabetes.  Patient has not been taking his insulin  for few days and has been using cocaine.  Patient complains of weakness   Hyperglycemia      Prior to Admission medications   Medication Sig Start Date End Date Taking? Authorizing Provider  Continuous Glucose Sensor (DEXCOM G7 SENSOR) MISC 1 Units by Does not apply route See admin instructions. Apply as directed to the skin--- change about every 10 days Patient not taking: Reported on 10/24/2023 10/18/23   Pearlean Manus, MD  ELDERBERRY PO Take 1 tablet by mouth daily as needed (immune support). Patient not taking: Reported on 10/24/2023    [provider]  gabapentin  (NEURONTIN ) 300 MG capsule Take 1 capsule (300 mg total) by mouth at bedtime. 10/18/23   Pearlean Manus, MD  insulin  NPH-regular Human (70-30) 100 UNIT/ML injection Inject 30 Units into the skin 2 (two) times daily with a meal. 10/18/23   Pearlean Manus, MD    Allergies: Sulfa antibiotics    Review of Systems  Updated Vital Signs BP (!) 152/103   Pulse 89   Temp 97.8 F (36.6 C)   Resp 18   Wt 65.8 kg   SpO2 100%   BMI 20.22 kg/m   Physical Exam  (all labs ordered are listed, but only abnormal results are displayed) Labs Reviewed  COMPREHENSIVE METABOLIC PANEL WITH GFR - Abnormal; Notable for the following components:      Result Value   Sodium 120 (*)    Potassium 5.9 (*)    Chloride 82 (*)    CO2 18 (*)    Glucose, Bld 1,148 (*)    BUN 42 (*)    Creatinine, Ser 1.88 (*)    Calcium 8.8 (*)    Total Protein 6.3 (*)    GFR, Estimated 49 (*)    Anion gap 20 (*)    All other components within normal limits  URINE DRUG  SCREEN - Abnormal; Notable for the following components:   Cocaine POSITIVE (*)    Tetrahydrocannabinol POSITIVE (*)    Fentanyl  POSITIVE (*)    All other components within normal limits  URINALYSIS, ROUTINE W REFLEX MICROSCOPIC - Abnormal; Notable for the following components:   Color, Urine COLORLESS (*)    Glucose, UA >=500 (*)    Ketones, ur 20 (*)    Protein, ur 30 (*)    All other components within normal limits  CBG MONITORING, ED - Abnormal; Notable for the following components:   Glucose-Capillary >600 (*)    All other components within normal limits  I-STAT CHEM 8, ED - Abnormal; Notable for the following components:   Sodium 123 (*)    Potassium 5.9 (*)    Chloride 90 (*)    BUN 41 (*)    Creatinine, Ser 1.80 (*)    Glucose, Bld >700 (*)    TCO2 18 (*)    All other components within normal limits  CBC WITH DIFFERENTIAL/PLATELET  ETHANOL  BLOOD GAS, VENOUS  BETA-HYDROXYBUTYRIC ACID  URINALYSIS, ROUTINE W REFLEX MICROSCOPIC  CBG MONITORING, ED   CRITICAL CARE Performed by: Fairy Sermon Total critical care time: 40 minutes Critical care time was exclusive  of separately billable procedures and treating other patients. Critical care was necessary to treat or prevent imminent or life-threatening deterioration. Critical care was time spent personally by me on the following activities: development of treatment plan with patient and/or surrogate as well as nursing, discussions with consultants, evaluation of patient's response to treatment, examination of patient, obtaining history from patient or surrogate, ordering and performing treatments and interventions, ordering and review of laboratory studies, ordering and review of radiographic studies, pulse oximetry and re-evaluation of patient's condition.  EKG: None  Radiology: No results found.  {Document cardiac monitor, telemetry assessment procedure when appropriate:32947} Procedures   Medications Ordered in the ED   lactated ringers  bolus 1,316 mL (has no administration in time range)  insulin  regular, human (MYXREDLIN ) 100 units/ 100 mL infusion (has no administration in time range)  lactated ringers  infusion (has no administration in time range)  dextrose  5 % in lactated ringers  infusion (0 mLs Intravenous Hold 01/07/24 2146)  dextrose  50 % solution 0-50 mL (has no administration in time range)  sodium chloride  0.9 % bolus 1,000 mL (0 mLs Intravenous Stopped 01/07/24 2200)  sodium chloride  0.9 % bolus 1,000 mL (1,000 mLs Intravenous New Bag/Given 01/07/24 2200)      {Click here for ABCD2, HEART and other calculators REFRESH Note before signing:1}                              Medical Decision Making Amount and/or Complexity of Data Reviewed Labs: ordered. ECG/medicine tests: ordered.  Risk Prescription drug management. Decision regarding hospitalization.  Patient in DKA.  He will be admitted to medicine  {Document critical care time when appropriate  Document review of labs and clinical decision tools ie CHADS2VASC2, etc  Document your independent review of radiology images and any outside records  Document your discussion with family members, caretakers and with consultants  Document social determinants of health affecting pt's care  Document your decision making why or why not admission, treatments were needed:32947:::1}   Final diagnoses:  Diabetic ketoacidosis without coma associated with diabetes mellitus due to underlying condition [facility])    ED Discharge Orders     None

## 2024-01-08 DIAGNOSIS — F1210 Cannabis abuse, uncomplicated: Secondary | ICD-10-CM | POA: Diagnosis not present

## 2024-01-08 DIAGNOSIS — E871 Hypo-osmolality and hyponatremia: Secondary | ICD-10-CM

## 2024-01-08 DIAGNOSIS — Z794 Long term (current) use of insulin: Secondary | ICD-10-CM | POA: Diagnosis not present

## 2024-01-08 DIAGNOSIS — E1010 Type 1 diabetes mellitus with ketoacidosis without coma: Secondary | ICD-10-CM | POA: Diagnosis not present

## 2024-01-08 DIAGNOSIS — Z91148 Patient's other noncompliance with medication regimen for other reason: Secondary | ICD-10-CM | POA: Diagnosis not present

## 2024-01-08 DIAGNOSIS — Z8249 Family history of ischemic heart disease and other diseases of the circulatory system: Secondary | ICD-10-CM | POA: Diagnosis not present

## 2024-01-08 DIAGNOSIS — Z882 Allergy status to sulfonamides status: Secondary | ICD-10-CM | POA: Diagnosis not present

## 2024-01-08 DIAGNOSIS — I10 Essential (primary) hypertension: Secondary | ICD-10-CM | POA: Diagnosis not present

## 2024-01-08 DIAGNOSIS — E7800 Pure hypercholesterolemia, unspecified: Secondary | ICD-10-CM | POA: Diagnosis not present

## 2024-01-08 DIAGNOSIS — K3184 Gastroparesis: Secondary | ICD-10-CM | POA: Diagnosis not present

## 2024-01-08 DIAGNOSIS — F1920 Other psychoactive substance dependence, uncomplicated: Secondary | ICD-10-CM | POA: Diagnosis not present

## 2024-01-08 DIAGNOSIS — E1110 Type 2 diabetes mellitus with ketoacidosis without coma: Secondary | ICD-10-CM | POA: Diagnosis not present

## 2024-01-08 DIAGNOSIS — E1043 Type 1 diabetes mellitus with diabetic autonomic (poly)neuropathy: Secondary | ICD-10-CM | POA: Diagnosis not present

## 2024-01-08 DIAGNOSIS — Z833 Family history of diabetes mellitus: Secondary | ICD-10-CM | POA: Diagnosis not present

## 2024-01-08 DIAGNOSIS — Z79899 Other long term (current) drug therapy: Secondary | ICD-10-CM | POA: Diagnosis not present

## 2024-01-08 DIAGNOSIS — F17210 Nicotine dependence, cigarettes, uncomplicated: Secondary | ICD-10-CM | POA: Diagnosis present

## 2024-01-08 DIAGNOSIS — T383X6A Underdosing of insulin and oral hypoglycemic [antidiabetic] drugs, initial encounter: Secondary | ICD-10-CM | POA: Diagnosis present

## 2024-01-08 DIAGNOSIS — E1042 Type 1 diabetes mellitus with diabetic polyneuropathy: Secondary | ICD-10-CM | POA: Diagnosis not present

## 2024-01-08 DIAGNOSIS — Z818 Family history of other mental and behavioral disorders: Secondary | ICD-10-CM | POA: Diagnosis not present

## 2024-01-08 DIAGNOSIS — E860 Dehydration: Secondary | ICD-10-CM | POA: Diagnosis not present

## 2024-01-08 DIAGNOSIS — F1410 Cocaine abuse, uncomplicated: Secondary | ICD-10-CM | POA: Diagnosis not present

## 2024-01-08 DIAGNOSIS — E875 Hyperkalemia: Secondary | ICD-10-CM | POA: Diagnosis present

## 2024-01-08 DIAGNOSIS — N179 Acute kidney failure, unspecified: Secondary | ICD-10-CM | POA: Diagnosis not present

## 2024-01-08 LAB — BETA-HYDROXYBUTYRIC ACID
Beta-Hydroxybutyric Acid: 6.91 mmol/L — ABNORMAL HIGH (ref 0.05–0.27)
Beta-Hydroxybutyric Acid: 0.95 mmol/L — ABNORMAL HIGH (ref 0.05–0.27)
Beta-Hydroxybutyric Acid: 0.58 mmol/L — ABNORMAL HIGH (ref 0.05–0.27)
Beta-Hydroxybutyric Acid: 0.82 mmol/L — ABNORMAL HIGH (ref 0.05–0.27)
Beta-Hydroxybutyric Acid: 0.33 mmol/L — ABNORMAL HIGH (ref 0.05–0.27)

## 2024-01-08 LAB — BASIC METABOLIC PANEL WITH GFR
Anion gap: 9 (ref 5–15)
Anion gap: 8 (ref 5–15)
Anion gap: 8 (ref 5–15)
Anion gap: 8 (ref 5–15)
BUN: 39 mg/dL — ABNORMAL HIGH (ref 6–20)
BUN: 36 mg/dL — ABNORMAL HIGH (ref 6–20)
BUN: 34 mg/dL — ABNORMAL HIGH (ref 6–20)
BUN: 32 mg/dL — ABNORMAL HIGH (ref 6–20)
CO2: 22 mmol/L (ref 22–32)
CO2: 23 mmol/L (ref 22–32)
CO2: 22 mmol/L (ref 22–32)
CO2: 24 mmol/L (ref 22–32)
Calcium: 8.6 mg/dL — ABNORMAL LOW (ref 8.9–10.3)
Calcium: 8.4 mg/dL — ABNORMAL LOW (ref 8.9–10.3)
Calcium: 8.2 mg/dL — ABNORMAL LOW (ref 8.9–10.3)
Calcium: 8.1 mg/dL — ABNORMAL LOW (ref 8.9–10.3)
Chloride: 99 mmol/L (ref 98–111)
Chloride: 103 mmol/L (ref 98–111)
Chloride: 103 mmol/L (ref 98–111)
Chloride: 103 mmol/L (ref 98–111)
Creatinine, Ser: 1.46 mg/dL — ABNORMAL HIGH (ref 0.61–1.24)
Creatinine, Ser: 1.36 mg/dL — ABNORMAL HIGH (ref 0.61–1.24)
Creatinine, Ser: 1.15 mg/dL (ref 0.61–1.24)
Creatinine, Ser: 1.05 mg/dL (ref 0.61–1.24)
GFR, Estimated: >60 mL/min (ref >60)
GFR, Estimated: >60 mL/min (ref >60)
GFR, Estimated: >60 mL/min (ref >60)
GFR, Estimated: >60 mL/min (ref >60)
Glucose, Bld: 398 mg/dL — ABNORMAL HIGH (ref 70–99)
Glucose, Bld: 165 mg/dL — ABNORMAL HIGH (ref 70–99)
Glucose, Bld: 142 mg/dL — ABNORMAL HIGH (ref 70–99)
Glucose, Bld: 130 mg/dL — ABNORMAL HIGH (ref 70–99)
Potassium: 4.2 mmol/L (ref 3.5–5.1)
Potassium: 3.9 mmol/L (ref 3.5–5.1)
Potassium: 3.8 mmol/L (ref 3.5–5.1)
Potassium: 4 mmol/L (ref 3.5–5.1)
Sodium: 130 mmol/L — ABNORMAL LOW (ref 135–145)
Sodium: 134 mmol/L — ABNORMAL LOW (ref 135–145)
Sodium: 134 mmol/L — ABNORMAL LOW (ref 135–145)
Sodium: 135 mmol/L (ref 135–145)

## 2024-01-08 LAB — CBC
HCT: 39.9 % (ref 39.0–52.0)
Hemoglobin: 13.8 g/dL (ref 13.0–17.0)
MCH: 30.7 pg (ref 26.0–34.0)
MCHC: 34.6 g/dL (ref 30.0–36.0)
MCV: 88.9 fL (ref 80.0–100.0)
Platelets: 283 K/uL (ref 150–400)
RBC: 4.49 MIL/uL (ref 4.22–5.81)
RDW: 12.6 % (ref 11.5–15.5)
WBC: 11.9 K/uL — ABNORMAL HIGH (ref 4.0–10.5)
nRBC: 0 % (ref 0.0–0.2)

## 2024-01-08 LAB — GLUCOSE, CAPILLARY
Glucose-Capillary: 119 mg/dL — ABNORMAL HIGH (ref 70–99)
Glucose-Capillary: 125 mg/dL — ABNORMAL HIGH (ref 70–99)
Glucose-Capillary: 135 mg/dL — ABNORMAL HIGH (ref 70–99)
Glucose-Capillary: 145 mg/dL — ABNORMAL HIGH (ref 70–99)
Glucose-Capillary: 148 mg/dL — ABNORMAL HIGH (ref 70–99)
Glucose-Capillary: 149 mg/dL — ABNORMAL HIGH (ref 70–99)
Glucose-Capillary: 177 mg/dL — ABNORMAL HIGH (ref 70–99)
Glucose-Capillary: 196 mg/dL — ABNORMAL HIGH (ref 70–99)
Glucose-Capillary: 259 mg/dL — ABNORMAL HIGH (ref 70–99)
Glucose-Capillary: 278 mg/dL — ABNORMAL HIGH (ref 70–99)
Glucose-Capillary: 314 mg/dL — ABNORMAL HIGH (ref 70–99)
Glucose-Capillary: 332 mg/dL — ABNORMAL HIGH (ref 70–99)
Glucose-Capillary: 359 mg/dL — ABNORMAL HIGH (ref 70–99)
Glucose-Capillary: 449 mg/dL — ABNORMAL HIGH (ref 70–99)
Glucose-Capillary: 467 mg/dL — ABNORMAL HIGH (ref 70–99)
Glucose-Capillary: 591 mg/dL (ref 70–99)
Glucose-Capillary: >600 mg/dL (ref 70–99)
Glucose-Capillary: >600 mg/dL (ref 70–99)
Glucose-Capillary: >600 mg/dL (ref 70–99)

## 2024-01-08 LAB — MRSA NEXT GEN BY PCR, NASAL: MRSA by PCR Next Gen: NOT DETECTED

## 2024-01-08 MED ORDER — CALCIUM GLUCONATE-NACL 1-0.675 GM/50ML-% IV SOLN
1.0000 g | Freq: Once | INTRAVENOUS | Status: AC
  Administered 2024-01-08: 1000 mg via INTRAVENOUS
  Filled 2024-01-08: qty 50

## 2024-01-08 MED ORDER — DEXTROSE 50 % IV SOLN: 0.0000 mL | INTRAVENOUS | Status: DC | PRN

## 2024-01-08 MED ORDER — LACTATED RINGERS IV SOLN: INTRAVENOUS | Status: AC

## 2024-01-08 MED ORDER — INSULIN ASPART 100 UNIT/ML IJ SOLN
0.0000 [IU] | INTRAMUSCULAR | Status: DC
  Administered 2024-01-08: 5 [IU] via SUBCUTANEOUS
  Administered 2024-01-08: 7 [IU] via SUBCUTANEOUS
  Administered 2024-01-09: 1 [IU] via SUBCUTANEOUS
  Administered 2024-01-09 (×2): 5 [IU] via SUBCUTANEOUS
  Administered 2024-01-09: 3 [IU] via SUBCUTANEOUS
  Filled 2024-01-08 (×2): qty 1

## 2024-01-08 MED ORDER — DEXTROSE IN LACTATED RINGERS 5 % IV SOLN: INTRAVENOUS | Status: DC

## 2024-01-08 MED ORDER — ONDANSETRON HCL 4 MG/2ML IJ SOLN: 4.0000 mg | Freq: Four times a day (QID) | INTRAMUSCULAR | Status: DC | PRN

## 2024-01-08 MED ORDER — INSULIN GLARGINE-YFGN 100 UNIT/ML ~~LOC~~ SOLN
15.0000 [IU] | Freq: Every day | SUBCUTANEOUS | Status: DC
  Administered 2024-01-08 – 2024-01-09 (×2): 15 [IU] via SUBCUTANEOUS
  Filled 2024-01-08 (×3): qty 0.15

## 2024-01-08 MED ORDER — INSULIN ASPART 100 UNIT/ML IJ SOLN
3.0000 [IU] | Freq: Three times a day (TID) | INTRAMUSCULAR | Status: DC
  Administered 2024-01-08 – 2024-01-09 (×4): 3 [IU] via SUBCUTANEOUS
  Filled 2024-01-08 (×2): qty 1

## 2024-01-08 MED ORDER — ACETAMINOPHEN 325 MG PO TABS
650.0000 mg | ORAL_TABLET | Freq: Four times a day (QID) | ORAL | Status: DC | PRN
  Administered 2024-01-09: 650 mg via ORAL
  Filled 2024-01-08: qty 2

## 2024-01-08 MED ORDER — ENOXAPARIN SODIUM 40 MG/0.4ML IJ SOSY
40.0000 mg | PREFILLED_SYRINGE | INTRAMUSCULAR | Status: DC
  Filled 2024-01-08 (×2): qty 0.4

## 2024-01-08 NOTE — Inpatient Diabetes Management (Signed)
 Inpatient Diabetes Program Recommendations  AACE/ADA: New Consensus Statement on Inpatient Glycemic Control   Target Ranges:  Prepandial:   less than 140 mg/dL      Peak postprandial:   less than 180 mg/dL (1-2 hours)      Critically ill patients:  140 - 180 mg/dL    Latest Reference Range & Units 01/08/24 00:32 01/08/24 01:02 01/08/24 01:32 01/08/24 02:02 01/08/24 02:31 01/08/24 03:30 01/08/24 04:29 01/08/24 05:27 01/08/24 06:34 01/08/24 07:35  Glucose-Capillary 70 - 99 mg/dL >399 (HH) 408 (HH) 532 (H) 449 (H) 359 (H) 314 (H) 278 (H) 196 (H) 177 (H) 148 (H)    Latest Reference Range & Units 01/07/24 20:48  CO2 22 - 32 mmol/L 18 (L)  Glucose 70 - 99 mg/dL 8,851 (HH)  Anion gap 5 - 15  20 (H)   Review of Glycemic Control  Diabetes history: DM1; does not make any insulin  at all; requires basal, correction, and meal coverage  Outpatient Diabetes medications: 70/30 30 units BID Current orders for Inpatient glycemic control: IV insulin    Inpatient Diabetes Program Recommendations:     Insulin : Once provider is ready to transition from IV to SQ insulin , please consider ordering Semglee  15 units Q24H, CBGs Q4H, Novolog  0-9 units Q4H. If diet is ordered, please order Novolog  3 units TID with meals for meal coverage if patient eats at least 50% of meals.  NOTE: Patient admitted with DKA with initial lab glucose of 1148 mg/dl. Per H&P, patient did not take insulin  for a couple days due to polysubstance abuse (per H&P, UDS positive for THC, cocaine and fentanyl ). Patient was recently inpatient 10/17/23-10/18/23 and inpatient diabetes coordinator spoke with patient on 10/17/23 regarding DM, A1C, and importance of getting DM under control. Anticipate patient's hx of drug abuse impacts patient's ability to manage DM.   Thanks, Earnie Gainer, RN, MSN, CDCES Diabetes Coordinator Inpatient Diabetes Program [phone] (Team Pager from 8am to 5pm)

## 2024-01-08 NOTE — Progress Notes (Signed)
   01/08/24 1302  TOC Brief Assessment  Insurance and Status Reviewed  Patient has primary care physician Yes  Home environment has been reviewed Home  Prior level of function: independent  Prior/Current Home Services No current home services  Social Drivers of Health Review SDOH reviewed no interventions necessary  Readmission risk has been reviewed Yes  Transition of care needs no transition of care needs at this time   Inpatient Care Manager (ICM) has reviewed patient and no ICM needs have been identified at this time. We will continue to monitor patient advancement through interdisciplinary progression rounds. If new patient transition needs arise, please place a ICM consult.

## 2024-01-08 NOTE — Progress Notes (Signed)
 PROGRESS NOTE  Pedro [name] FMW:[id number] DOB: 1995-12-23 DOA: 01/07/2024 PCP: Shona Norleen PEDLAR, MD  Brief History:  28 year old male with a history of polysubstance abuse (tobacco, THC, cocaine),  DM type 1, hypertension, hyperlipidemia presenting with high CBG readings on his glucometer.  Patient's had some nausea and generalized weakness.  He denies any frank vomiting, abdominal pain, dysuria, hematuria, chest pain, short of breath. He states that he has been compliant with his 70/30 insulin ; however, he has told previous providers that he only takes his insulin  a couple times a week.  In addition, he told the ER provider that he last took insulin  a few days ago.  He last used cocaine 01/07/24.  He denies any headache, neck pain, fevers, chills.  In the ED, the patient was afebrile hemodynamically stable with oxygen saturation 97% room air.  WBC 7.7, hemoglobin 15.2, platelet 289.  Sodium 120, potassium 5.9, bicarbonate 18, serum creatinine 1.88.  Serum glucose was 1148.  The patient was started on IV insulin  and IV fluids. UA negative for pyuria, positive for ketones.  UDS positive for cocaine and THC.  VBG 7.26/35/<30/15.  EKG showed sinus rhythm with T wave inversion in V1 V2.  He was recently discharged from the hospital after a stay from 10/16/2023 to 10/18/2023.  He was discharged home with 70/30 insulin  and Dexcom G7 device   Assessment/Plan:  DKA type I -patient started on IV insulin  with q 1 hour CBG check and q 4 hour BMPs -pt started on aggressive fluid resuscitation -Electrolytes were monitored and repleted -transitioned to [place] insulin  once anion gap closed -diet was advanced once anion gap closed - 10/16/2023 HbA1C-- 12.8  Polysubstance abuse - Including cocaine, tobacco, THC - Cessation discussed  AKI - Serum creatinine 1.88 at the time of presentation - Baseline creatinine 0.9-1.1 - Secondary to volume depletion - Continue IV fluids  Hyponatremia -  Secondary to hyperglycemia - Improving with IV fluid resuscitation and correction of glucose  Diabetic polyneuropathy - Continue gabapentin           Family Communication:  no Family at bedside  Consultants: none   Code Status:  FULL   DVT Prophylaxis:  [place] Lovenox    Procedures: As Listed in Progress Note Above  Antibiotics: None       Subjective: Patient denies fevers, chills, headache, chest pain, dyspnea, nausea, vomiting, diarrhea, abdominal pain, dysuria, hematuria, hematochezia, and melena.   Objective: Vitals:   01/08/24 0200 01/08/24 0300 01/08/24 0400 01/08/24 0755  BP: 126/72 117/78 104/75   Pulse: 88 87 80   Resp: (!) 21 15 15    Temp:  98.7 F (37.1 C) (P) 98.7 F (37.1 C) 97.6 F (36.4 C)  TempSrc:  Oral (P) Oral Oral  SpO2: 97% 96% 97%   Weight:        Intake/Output Summary (Last 24 hours) at 01/08/2024 0853 Last data filed at 01/08/2024 9260 Gross per 24 hour  Intake 1951.68 ml  Output 1400 ml  Net 551.68 ml   Weight change:  Exam:  General:  Pt is alert, follows commands appropriately, not in acute distress HEENT: No icterus, No thrush, No neck mass, [place]/AT Cardiovascular: RRR, S1/S2, no rubs, no gallops Respiratory: CTA bilaterally, no wheezing, no crackles, no rhonchi Abdomen: Soft/+BS, non tender, non distended, no guarding Extremities: No edema, No lymphangitis, No petechiae, No rashes, no synovitis   Data Reviewed: I have personally reviewed following labs and imaging studies Basic  Metabolic Panel: Recent Labs  Lab 01/07/24 2048 01/07/24 2101 01/08/24 0246 01/08/24 0657  NA 120* 123* 130* 134*  K 5.9* 5.9* 4.2 3.9  CL 82* 90* 99 103  CO2 18*  --  22 23  GLUCOSE 1,148* >700* 398* 165*  BUN 42* 41* 39* 36*  CREATININE 1.88* 1.80* 1.46* 1.36*  CALCIUM 8.8*  --  8.6* 8.4*   Liver Function Tests: Recent Labs  Lab 01/07/24 2048  AST 22  ALT 22  ALKPHOS 97  BILITOT 1.2  PROT 6.3*  ALBUMIN 3.9   No results for  input(s): LIPASE, AMYLASE in the last 168 hours. No results for input(s): AMMONIA in the last 168 hours. Coagulation Profile: No results for input(s): INR, PROTIME in the last 168 hours. CBC: Recent Labs  Lab 01/07/24 2048 01/07/24 2101 01/08/24 0246  WBC 7.7  --  11.9*  NEUTROABS 5.4  --   --   HGB 15.2 16.3 13.8  HCT 47.6 48.0 39.9  MCV 96.2  --  88.9  PLT 289  --  283   Cardiac Enzymes: No results for input(s): CKTOTAL, CKMB, CKMBINDEX, TROPONINI in the last 168 hours. BNP: Invalid input(s): POCBNP CBG: Recent Labs  Lab 01/08/24 0429 01/08/24 0527 01/08/24 0634 01/08/24 0735 01/08/24 0837  GLUCAP 278* 196* 177* 148* 149*   HbA1C: No results for input(s): HGBA1C in the last 72 hours. Urine analysis:    Component Value Date/Time   COLORURINE COLORLESS (A) 01/07/2024 2111   APPEARANCEUR CLEAR 01/07/2024 2111   LABSPEC 1.018 01/07/2024 2111   PHURINE 6.0 01/07/2024 2111   GLUCOSEU >=500 (A) 01/07/2024 2111   HGBUR NEGATIVE 01/07/2024 2111   BILIRUBINUR NEGATIVE 01/07/2024 2111   BILIRUBINUR neg 04/16/2014 1752   KETONESUR 20 (A) 01/07/2024 2111   PROTEINUR 30 (A) 01/07/2024 2111   UROBILINOGEN 0.2 10/17/2014 0743   NITRITE NEGATIVE 01/07/2024 2111   LEUKOCYTESUR NEGATIVE 01/07/2024 2111   Sepsis Labs: @LABRCNTIP (procalcitonin:4,lacticidven:4) ) Recent Results (from the past 240 hours)  MRSA Next Gen by PCR, Nasal     Status: None   Collection Time: 01/08/24  5:43 AM   Specimen: Nasal Mucosa; Nasal Swab  Result Value Ref Range Status   MRSA by PCR Next Gen NOT DETECTED NOT DETECTED Final    Comment: (NOTE) The GeneXpert MRSA Assay (FDA approved for NASAL specimens only), is one component of a comprehensive MRSA colonization surveillance program. It is not intended to diagnose MRSA infection nor to guide or monitor treatment for MRSA infections. Test performance is not FDA approved in patients less than 54 years old. Performed at  [facility], [address], [place], [place] [address]      Scheduled Meds:  Chlorhexidine  Gluconate Cloth  6 each Topical Q0600   enoxaparin  (LOVENOX ) injection  40 mg Subcutaneous Q24H   Continuous Infusions:  dextrose  5% lactated ringers  Stopped (01/07/24 2146)   dextrose  5% lactated ringers  125 mL/hr at 01/08/24 0739   insulin  0.3 Units/hr (01/08/24 0739)   lactated ringers  Stopped (01/08/24 0536)    Procedures/Studies: No results found.  Alm Schneider, DO  [facility]  If 7PM-7AM, please contact night-coverage [email or web address] Password [email or web address] 01/08/2024, 8:53 AM   LOS: 0 days

## 2024-01-08 NOTE — Progress Notes (Signed)
 eLink Physician-Brief Progress Note Patient Name: Pedro [name] DOB: 1995/12/21 MRN: [id number]   Date of Service  01/08/2024  HPI/Events of Note  28 y.o. male with type 1 diabetes mellitus, hypertension, ADHD, gastroparesis, neuropathy, and polysubstance abuse who presents to the emergency department due to elevated blood glucose level.  Patient has not been taking his insulin , but has been using cocaine and now complaining of weakness.  Blood sugar level checked at home read high .  Evaluation in the ED revealed sodium 120, potassium 5.9, chloride 82, bicarb 18, blood glucose 1148, creatinine 1.88, anion gap 20 Patient received volume repletion and was started on insulin  drip.  eICU Interventions  Chart reviewed     Intervention Category Evaluation Type: New Patient Evaluation  CLAUDENE AGENT, P 01/08/2024, 3:33 AM

## 2024-01-08 NOTE — Plan of Care (Signed)
  Problem: Education: Goal: Knowledge of General Education information will improve Description: Including pain rating scale, medication(s)/side effects and non-pharmacologic comfort measures Outcome: Progressing   Problem: Health Behavior/Discharge Planning: Goal: Ability to manage health-related needs will improve Outcome: Progressing   Problem: Clinical Measurements: Goal: Ability to maintain clinical measurements within normal limits will improve Outcome: Progressing Goal: Will remain free from infection Outcome: Progressing Goal: Diagnostic test results will improve Outcome: Progressing Goal: Respiratory complications will improve Outcome: Progressing Goal: Cardiovascular complication will be avoided Outcome: Progressing   Problem: Activity: Goal: Risk for activity intolerance will decrease Outcome: Progressing   Problem: Nutrition: Goal: Adequate nutrition will be maintained Outcome: Progressing   Problem: Coping: Goal: Level of anxiety will decrease Outcome: Progressing   Problem: Elimination: Goal: Will not experience complications related to bowel motility Outcome: Progressing Goal: Will not experience complications related to urinary retention Outcome: Progressing   Problem: Pain Managment: Goal: General experience of comfort will improve and/or be controlled Outcome: Progressing   Problem: Safety: Goal: Ability to remain free from injury will improve Outcome: Progressing   Problem: Skin Integrity: Goal: Risk for impaired skin integrity will decrease Outcome: Progressing   Problem: Education: Goal: Ability to describe self-care measures that may prevent or decrease complications (Diabetes Survival Skills Education) will improve Outcome: Progressing Goal: Individualized Educational Video(s) Outcome: Progressing   Problem: Coping: Goal: Ability to adjust to condition or change in health will improve Outcome: Progressing   Problem: Fluid  Volume: Goal: Ability to maintain a balanced intake and output will improve Outcome: Progressing

## 2024-01-08 NOTE — Hospital Course (Addendum)
 28 year old male with a history of polysubstance abuse (tobacco, THC, cocaine),  DM type 1, hypertension, hyperlipidemia presenting with high CBG readings on his glucometer.  Patient's had some nausea and generalized weakness.  He denies any frank vomiting, abdominal pain, dysuria, hematuria, chest pain, short of breath. He states that he has been compliant with his 70/30 insulin ; however, he has told previous providers that he only takes his insulin  a couple times a week.  In addition, he told the ER provider that he last took insulin  a few days ago.  He last used cocaine 01/07/24.  He denies any headache, neck pain, fevers, chills.  In the ED, the patient was afebrile hemodynamically stable with oxygen saturation 97% room air.  WBC 7.7, hemoglobin 15.2, platelet 289.  Sodium 120, potassium 5.9, bicarbonate 18, serum creatinine 1.88.  Serum glucose was 1148.  The patient was started on IV insulin  and IV fluids. UA negative for pyuria, positive for ketones.  UDS positive for cocaine and THC.  VBG 7.26/35/<30/15.  EKG showed sinus rhythm with T wave inversion in V1 V2.  He was recently discharged from the hospital after a stay from 10/16/2023 to 10/18/2023.  He was discharged home with 70/30 insulin  and Dexcom G7 device

## 2024-01-09 ENCOUNTER — Other Ambulatory Visit: Payer: Self-pay

## 2024-01-09 DIAGNOSIS — E871 Hypo-osmolality and hyponatremia: Secondary | ICD-10-CM | POA: Diagnosis not present

## 2024-01-09 DIAGNOSIS — E1010 Type 1 diabetes mellitus with ketoacidosis without coma: Secondary | ICD-10-CM | POA: Diagnosis not present

## 2024-01-09 DIAGNOSIS — N179 Acute kidney failure, unspecified: Secondary | ICD-10-CM | POA: Diagnosis not present

## 2024-01-09 LAB — CBC
HCT: 39.2 % (ref 39.0–52.0)
Hemoglobin: 13.2 g/dL (ref 13.0–17.0)
MCH: 30.6 pg (ref 26.0–34.0)
MCHC: 33.7 g/dL (ref 30.0–36.0)
MCV: 90.7 fL (ref 80.0–100.0)
Platelets: 251 K/uL (ref 150–400)
RBC: 4.32 MIL/uL (ref 4.22–5.81)
RDW: 13 % (ref 11.5–15.5)
WBC: 10.2 K/uL (ref 4.0–10.5)
nRBC: 0 % (ref 0.0–0.2)

## 2024-01-09 LAB — GLUCOSE, CAPILLARY
Glucose-Capillary: 134 mg/dL — ABNORMAL HIGH (ref 70–99)
Glucose-Capillary: 211 mg/dL — ABNORMAL HIGH (ref 70–99)
Glucose-Capillary: 285 mg/dL — ABNORMAL HIGH (ref 70–99)
Glucose-Capillary: 296 mg/dL — ABNORMAL HIGH (ref 70–99)

## 2024-01-09 LAB — BASIC METABOLIC PANEL WITH GFR
Anion gap: 7 (ref 5–15)
BUN: 26 mg/dL — ABNORMAL HIGH (ref 6–20)
CO2: 26 mmol/L (ref 22–32)
Calcium: 7.8 mg/dL — ABNORMAL LOW (ref 8.9–10.3)
Chloride: 105 mmol/L (ref 98–111)
Creatinine, Ser: 0.97 mg/dL (ref 0.61–1.24)
GFR, Estimated: >60 mL/min (ref >60)
Glucose, Bld: 117 mg/dL — ABNORMAL HIGH (ref 70–99)
Potassium: 3.8 mmol/L (ref 3.5–5.1)
Sodium: 138 mmol/L (ref 135–145)

## 2024-01-09 LAB — MAGNESIUM: Magnesium: 1.9 mg/dL (ref 1.7–2.4)

## 2024-01-09 MED ORDER — INSULIN ASPART 100 UNIT/ML IJ SOLN: 5.0000 [IU] | Freq: Three times a day (TID) | INTRAMUSCULAR | Status: DC

## 2024-01-09 MED ORDER — INSULIN GLARGINE-YFGN 100 UNIT/ML ~~LOC~~ SOLN
18.0000 [IU] | Freq: Every day | SUBCUTANEOUS | Status: DC
  Filled 2024-01-09: qty 0.18

## 2024-01-09 MED ORDER — NICOTINE 21 MG/24HR TD PT24
21.0000 mg | MEDICATED_PATCH | Freq: Every day | TRANSDERMAL | Status: DC
  Administered 2024-01-09: 21 mg via TRANSDERMAL
  Filled 2024-01-09: qty 1

## 2024-01-09 NOTE — Inpatient Diabetes Management (Signed)
 Inpatient Diabetes Program Recommendations  AACE/ADA: New Consensus Statement on Inpatient Glycemic Control   Target Ranges:  Prepandial:   less than 140 mg/dL      Peak postprandial:   less than 180 mg/dL (1-2 hours)      Critically ill patients:  140 - 180 mg/dL    Latest Reference Range & Units 01/08/24 10:34 01/08/24 11:28 01/08/24 12:31 01/08/24 16:02 01/08/24 19:39 01/09/24 00:14 01/09/24 04:18  Glucose-Capillary 70 - 99 mg/dL 864 (H) 874 (H) 740 (H) 119 (H) 332 (H) 211 (H) 134 (H)    Latest Reference Range & Units 01/07/24 20:48  CO2 22 - 32 mmol/L 18 (L)  Glucose 70 - 99 mg/dL 8,851 (HH)  Anion gap 5 - 15  20 (H)   Review of Glycemic Control  Diabetes history: DM1; does not make any insulin  at all; requires basal, correction, and meal coverage  Outpatient Diabetes medications: 70/30 30 units BID Current orders for Inpatient glycemic control: Semglee  15 units daily, Novolog  0-9 units Q4H, Novolog  3 units TID with meals  Inpatient Diabetes Program Recommendations:    Insulin : Please consider increasing Semglee  to 18 units daily, increasing meal coverage to Novolog  5 units TID with meals, and change CBGs to AC&HS and Novolog  0-9 units to AC&HS.  Thanks, Earnie Gainer, RN, MSN, CDCES Diabetes Coordinator Inpatient Diabetes Program [phone] (Team Pager from 8am to 5pm)

## 2024-01-09 NOTE — Discharge Summary (Addendum)
 Physician Discharge Summary   Patient: Pedro [name] MRN: [id number] DOB: 31-Aug-1995  Admit date:     01/07/2024  Discharge date: 01/09/24  Discharge Physician: Alm Zooey Schreurs   PCP: Shona Norleen PEDLAR, MD   Recommendations at discharge:   Please follow up with primary care provider within 1-2 weeks  Please repeat BMP and CBC in one week     Hospital Course: 28 year old male with a history of polysubstance abuse (tobacco, THC, cocaine),  DM type 1, hypertension, hyperlipidemia presenting with high CBG readings on his glucometer.  Patient's had some nausea and generalized weakness.  He denies any frank vomiting, abdominal pain, dysuria, hematuria, chest pain, short of breath. He states that he has been compliant with his 70/30 insulin ; however, he has told previous providers that he only takes his insulin  a couple times a week.  In addition, he told the ER provider that he last took insulin  a few days ago.  He last used cocaine 01/07/24.  He denies any headache, neck pain, fevers, chills.  In the ED, the patient was afebrile hemodynamically stable with oxygen saturation 97% room air.  WBC 7.7, hemoglobin 15.2, platelet 289.  Sodium 120, potassium 5.9, bicarbonate 18, serum creatinine 1.88.  Serum glucose was 1148.  The patient was started on IV insulin  and IV fluids. UA negative for pyuria, positive for ketones.  UDS positive for cocaine and THC.  VBG 7.26/35/<30/15.  EKG showed sinus rhythm with T wave inversion in V1 V2.  He was recently discharged from the hospital after a stay from 10/16/2023 to 10/18/2023.  He was discharged home with 70/30 insulin  and Dexcom G7 device  Assessment and Plan: DKA type I -patient started on IV insulin  with q 1 hour CBG check and q 4 hour BMPs -pt started on aggressive fluid resuscitation -Electrolytes were monitored and repleted -transitioned to [place] insulin  once anion gap closed -diet was advanced once anion gap closed - 10/16/2023 HbA1C-- 12.8 - d/c home with  novolin 70/30, 30 units bid   Polysubstance abuse - Including cocaine, tobacco, THC - Cessation discussed - UDS positive for cocaine and THC - plan to transition to [facility] in [place] after d/c   AKI - Serum creatinine 1.88 at the time of presentation - Baseline creatinine 0.9-1.1 - Secondary to volume depletion - Continue IV fluids>>improved - serum creatinine 0.97 on day of dc   Hyponatremia - Secondary to hyperglycemia - Improving with IV fluid resuscitation and correction of glucose - Na 138 on day of dc   Diabetic polyneuropathy - Continue gabapentin                         Consultants: none Procedures performed: none  Disposition: Home Diet recommendation:  Carb modified diet DISCHARGE MEDICATION: Allergies as of 01/09/2024       Reactions   Sulfa Antibiotics Hives, Dermatitis, Other (See Comments)   Fever        Medication List     STOP taking these medications    amoxicillin  500 MG capsule Commonly known as: AMOXIL    Dexcom G7 Sensor Misc   ELDERBERRY PO   gabapentin  300 MG capsule Commonly known as: NEURONTIN    ibuprofen  400 MG tablet Commonly known as: ADVIL        TAKE these medications    insulin  NPH-regular Human (70-30) 100 UNIT/ML injection Inject 30 Units into the skin 2 (two) times daily with a meal.        Discharge Exam:  Filed Weights   01/07/24 2145 01/09/24 1147  Weight: 65.8 kg 65.7 kg   HEENT:  [place]/AT, No thrush, no icterus CV:  RRR, no rub, no S3, no S4 Lung:  CTA, no wheeze, no rhonchi Abd:  soft/+BS, NT Ext:  No edema, no lymphangitis, no synovitis, no rash   Condition at discharge: stable  The results of significant diagnostics from this hospitalization (including imaging, microbiology, ancillary and laboratory) are listed below for reference.   Imaging Studies: No results found.  Microbiology: Results for orders placed or performed during the hospital encounter of 01/07/24  MRSA Next Gen  by PCR, Nasal     Status: None   Collection Time: 01/08/24  5:43 AM   Specimen: Nasal Mucosa; Nasal Swab  Result Value Ref Range Status   MRSA by PCR Next Gen NOT DETECTED NOT DETECTED Final    Comment: (NOTE) The GeneXpert MRSA Assay (FDA approved for NASAL specimens only), is one component of a comprehensive MRSA colonization surveillance program. It is not intended to diagnose MRSA infection nor to guide or monitor treatment for MRSA infections. Test performance is not FDA approved in patients less than 55 years old. Performed at [facility], [address], [place], [place] [address]     Labs: CBC: Recent Labs  Lab 01/07/24 2048 01/07/24 2101 01/08/24 0246 01/09/24 0405  WBC 7.7  --  11.9* 10.2  NEUTROABS 5.4  --   --   --   HGB 15.2 16.3 13.8 13.2  HCT 47.6 48.0 39.9 39.2  MCV 96.2  --  88.9 90.7  PLT 289  --  283 251   Basic Metabolic Panel: Recent Labs  Lab 01/08/24 0246 01/08/24 0657 01/08/24 1000 01/08/24 1524 01/09/24 0405  NA 130* 134* 134* 135 138  K 4.2 3.9 3.8 4.0 3.8  CL 99 103 103 103 105  CO2 22 23 22 24 26   GLUCOSE 398* 165* 142* 130* 117*  BUN 39* 36* 34* 32* 26*  CREATININE 1.46* 1.36* 1.15 1.05 0.97  CALCIUM 8.6* 8.4* 8.2* 8.1* 7.8*  MG  --   --   --   --  1.9   Liver Function Tests: Recent Labs  Lab 01/07/24 2048  AST 22  ALT 22  ALKPHOS 97  BILITOT 1.2  PROT 6.3*  ALBUMIN 3.9   CBG: Recent Labs  Lab 01/08/24 1939 01/09/24 0014 01/09/24 0418 01/09/24 0725 01/09/24 1153  GLUCAP 332* 211* 134* 296* 285*    Discharge time spent: greater than 30 minutes.  Signed: Alm Schneider, MD [facility] 01/09/2024

## 2024-01-09 NOTE — TOC Transition Note (Signed)
 Transition of Care [facility]) - Discharge Note   Patient Details  Name: Pedro [name] MRN: [id number] Date of Birth: 1995-04-04  [facility] CM/SW Contact:  Sharlyne Stabs, RN Phone Number: 01/09/2024, 12:56 PM   Clinical Narrative:   Patient cousin will assist him to get into [facility]. Application completed. They will drug test him before he can enter into the program. She is aware he will need several days before checking in. They plan to put him up in a hotel for a few days. He has a infant children and states he will go and get help. He will just need to be medically stable to discharge and clear of drugs, they do not assist with detox.    Final next level of care: Home/Self Care Barriers to Discharge: Barriers Resolved   Patient Goals and CMS Choice       Discharge Placement               Patient and family notified of of transfer: 01/09/24  Discharge Plan and Services Additional resources added to the After Visit Summary for                   Social Drivers of Health (SDOH) Interventions SDOH Screenings   Food Insecurity: No Food Insecurity (01/09/2024)  Housing: Low Risk  (01/09/2024)  Transportation Needs: No Transportation Needs (01/09/2024)  Utilities: Not At Risk (01/09/2024)  Depression (PHQ2-9): Low Risk  (08/24/2018)  Financial Resource Strain: Low Risk  (11/02/2022)   Received from [facility]  Social Connections: Unknown (10/11/2022)   Received from [facility]  Tobacco Use: High Risk (01/07/2024)     Readmission Risk Interventions    01/08/2024    1:02 PM 02/14/2022   10:46 AM  Readmission Risk Prevention Plan  Transportation Screening Complete Complete  PCP or Specialist Appt within 5-7 Days Not Complete   Home Care Screening Complete   Medication Review (RN CM) Complete   Medication Review (RN Care Manager)  Complete  PCP or Specialist appointment within 3-5 days of discharge  Complete  HRI or Home Care Consult   Not Complete  SW Recovery Care/Counseling Consult  Not Complete  Palliative Care Screening  Not Applicable  Skilled Nursing Facility  Not Applicable

## 2024-01-10 ENCOUNTER — Telehealth: Payer: Self-pay

## 2024-01-10 NOTE — Transitions of Care (Post Inpatient/ED Visit) (Signed)
   01/10/2024  Name: Pedro [name] MRN: [id number] DOB: 22-Sep-1995  Today's TOC FU Call Status: Today's TOC FU Call Status:: Successful TOC FU Call Completed TOC FU Call Complete Date: 01/10/24 Patient's Name and Date of Birth confirmed.  Transition Care Management Follow-up Telephone Call Date of Discharge: 01/09/24 [facility] Type of Discharge: Inpatient Admission Primary Inpatient Discharge Diagnosis:: DKA How have you been since you were released from the hospital?: Better  Patient declined to continue TOC assessment and ended call.  States I do not want to talk anymore then hung up  Medications Reviewed Today:  Medications Reviewed Today   Medications were not reviewed in this encounter      Vianney Kopecky J. Zelie Asbill RN, MSN [facility]  [facility], [facility] RN Care Manager Direct Dial: [phone]  Fax: [phone] Website: [email or web address]

## 2024-01-12 DIAGNOSIS — F17210 Nicotine dependence, cigarettes, uncomplicated: Secondary | ICD-10-CM | POA: Diagnosis not present

## 2024-01-12 DIAGNOSIS — Z91148 Patient's other noncompliance with medication regimen for other reason: Secondary | ICD-10-CM | POA: Diagnosis not present

## 2024-01-12 DIAGNOSIS — F1410 Cocaine abuse, uncomplicated: Secondary | ICD-10-CM | POA: Diagnosis not present

## 2024-01-12 DIAGNOSIS — E1065 Type 1 diabetes mellitus with hyperglycemia: Secondary | ICD-10-CM | POA: Diagnosis not present

## 2024-01-12 DIAGNOSIS — Z794 Long term (current) use of insulin: Secondary | ICD-10-CM | POA: Diagnosis not present

## 2024-01-12 DIAGNOSIS — I10 Essential (primary) hypertension: Secondary | ICD-10-CM | POA: Diagnosis not present

## 2024-01-12 DIAGNOSIS — N179 Acute kidney failure, unspecified: Secondary | ICD-10-CM | POA: Diagnosis not present

## 2024-01-12 DIAGNOSIS — E785 Hyperlipidemia, unspecified: Secondary | ICD-10-CM | POA: Diagnosis not present

## 2024-01-12 DIAGNOSIS — F1220 Cannabis dependence, uncomplicated: Secondary | ICD-10-CM | POA: Diagnosis not present

## 2024-01-13 NOTE — Consults (Signed)
 Psychiatry Consultation   Date of Evaluation: 01/13/2024  Referral Source: Hospitalist History From: patient and medical record  Chief Complaint/Reason for Consult   Cocaine use disorder  Assessment   Psychiatric Diagnoses: Cannabis Use Disorder, Moderate (F12.90) (Primary Diagnosis)   Consider Substance induced mood disorder (F19.94) versus unspecified depressive disorder Cocaine use disorder (per patient's report.  UDS is negative)   Medical Diagnoses: Principal Problem:   AKI (acute kidney injury) Active Problems:   Type 1 diabetes mellitus with hyperglycemia    (CMD)   Cocaine use    Psychosocial and contextual factors: Risk Factors: lack of treatment-seeking behavior Protective Factors: capacity for reality testing and treatable psychiatric diagnoses.  Pedro [name] is a 28 y.o. male   On assessment the patient is alert, grossly oriented and able to make needs known. The patient appears to be logical and linear.  He appeared to be irritable and uncooperative with assessment.  Patient does not appear to be manic, psychotic, or responding to internal stimuli.  Patient endorses depression, did not elaborate and does not want to be assessed or asked any more questions.  He declined medication management of depression.  Currently denies suicidal ideations, homicidal ideations, auditory and visual hallucination and self harm.  Patient verbalized interest in cocaine detox this clinical research associate discussed with the patient that there are outside facilities that he can go for cocaine use disorder treatment for long-term postdischarge and we will give resources to help him with this, as he does not meet criteria for inpatient psychiatric admission at this facility.  Patient got more irritable and declined further assessment.  UDS is negative for cocaine and positive for marijuana.  Per primary team provider's report, patient stated that he last used cocaine 6 days ago.  This clinical research associate  offered to come back the following day to complete assessment and further explore management of depression, the patient declined.  Patient would benefit from further evaluation of substance use and depression and treatment.  Counselor has placed resources on the patient's discharge instruction.   Vital signs: Blood pressure 118/85, pulse 74, temperature 97.8 F (36.6 C), temperature source Oral, resp. rate 18, height 1.803 m (5' 11), weight 61.7 kg (136 lb), SpO2 98%.   PDMP Report of recent prescriptions:  Filled  Written  Sold  ID  Drug  QTY  Days  Prescriber  RX #  Dispenser  Refill  Daily Dose*  Pymt Type  PMP   09/19/2023 09/19/2023  2 Gabapentin  300 Mg Capsule 90.00 30 De Huf [id number] [facility] ([id number]) 0/0  Medicaid [place]  01/05/2023 01/05/2023  1 Gabapentin  300 Mg Capsule 90.00 30 Idell Coad [id number] [facility] [id number]) 0/0  Comm Ins [place]  06/27/2022 11/15/2021  1 Gabapentin  300 Mg Capsule 90.00 30 Xa Has [id number] [facility] ([id number]) 2/2  Comm Ins [place]  06/02/2022 06/02/2022  1 Gabapentin  300 Mg Capsule 90.00 30 De Huf [id number] [facility] ([id number]) 0/0  Comm Ins [place]    Recommendations    Based on my assessment, Patient does not meet criteria for inpatient psychiatric admission. Psychiatry will continue to follow at this time for further recommendations.   Legal Status/Commitment Status: N/A - Precautions:  - N/A Medication Changes/ Recommendations during hospitalization  Patient declined medication management and further assessment Relevant labs:  CMP: Sodium 137  and potassium 3.6  Mag: 2.2; glucose 207, initially >600 on hospital presentation Alcohol  Biomarkers:  - MCV: 92.3 - AST: 15 - ALT: 34 - GGT: No results found for requested labs within last 366  days. Blood Alcohol  Level: No results found for requested labs within last 366 days. CBC: Within normal limit Hgb A1c: 14.8 UDS: Positive for THC Urine Fentanyl : Negative Imaging <redacted file path>: No results found. Discharge plan/SW needs: Counselor to assist with  outpatient psychiatric resources Thank you for this consultation and allowing us  to assist in the care of your patient.   History of Present Illness   Pedro [name] is a 28 y.o. male with a pertinent history of ADHD, type 1 diabetes mellitus, dyslipidemia, hypertension, and polysubstance use disorder (Cocaine, Marijuana and Methamphetamine) who was admitted for acute kidney injury, type 1 diabetes melitis with hyperglycemia, and cocaine use. Psychiatry consulted due to cocaine use disorder treatment resources.  The patient reported a history of cocaine use, beginning at age 54, and described episodes of use that are not daily but characterized by intense consumption when they occur.  Currently denies suicidal ideations, homicidal ideations, auditory and visual hallucination and self harm.  Patient verbalized interest in cocaine detox    Review of Systems   Depression Symptoms: Patient reports depression but did not elaborate on symptoms. Anxiety Symptoms: Unable to assess Psychosis Symptoms: Unable to assess Mania Symptoms: Unable to assess  Trauma-Related Symptoms: Unable to assess  Review of Systems  Unable to assess  Substance Abuse History   Substance use reviewed with pt, with pertinent items below: Unable to complete a full assessment on substance use disorder.  The patient reported a history of cocaine use, beginning at age 1, and described episodes of use that are not daily but characterized by intense consumption when they occur. Per primary team provider's report, patient stated that he last used cocaine 6 days ago.  UDS is negative for cocaine and positive for marijuana. Psychiatric History   Previous diagnoses/symptoms: Per chart review, ADHD and polysubstance use disorder.  Unable to assess Family History   Unable to assess Social History   Unable to assess o  Mental Status Examination: General Appearance normal body habitus, casually  dressed, and hygiene appropriate   General Behavior guarded and poor eye contact and uncooperative  Psychomotor Activity normoactive  Gait and Station not assessed  Speech   fluent, normal volume, normal tone, normal prosody, and normal amount  Mood   Depressed Irritable  Affect    irritable  Thought Process linear/organized  Associations intact  Thought Content/Perceptual Disturbances Denies suicidal/homicidal ideation and auditory/visual hallucinations.  Cognition/Sensorium  orientation grossly intact and attention impaired. Unable to assess.  Insight  fair  Judgment poor    Vitals:  Vitals:   01/13/24 0138 01/13/24 0307 01/13/24 0716 01/13/24 1137  BP: (!) 134/97 118/78 121/83 118/85  BP Location: Right arm Right arm Right arm Right arm  Patient Position: Lying Lying Lying Lying  Pulse: 78 80 75 74  Resp: 18 16 18 18   Temp: 97.7 F (36.5 C) 97.4 F (36.3 C) 97.9 F (36.6 C) 97.8 F (36.6 C)  TempSrc: Oral Oral Oral Oral  SpO2: 100% 98% 100% 98%  Weight:      Height:        Labs:  Lab Results (last 72 hours)     Procedure Component Value Ref Range Date/Time   POC Glucose [[id number]]  (Abnormal) Collected: 01/13/24 1137   Lab Status: Final result Specimen: Blood from Capillary Updated: 01/13/24 1138    Glucose, POC 269* 70 - 99 mg/dL     Comment: Pre-Meal     POC Glucose [[id number]]  (Abnormal) Collected: 01/13/24 0654  Lab Status: Final result Specimen: Blood from Capillary Updated: 01/13/24 0655    Glucose, POC 191* 70 - 99 mg/dL     Comment: Pre-Meal     Basic Metabolic Panel [[id number]]  (Abnormal) Collected: 01/13/24 0640   Lab Status: Final result Specimen: Blood from Venous Updated: 01/13/24 0740    Sodium 137 136 - 145 mmol/L     Comment: Result Repeated       Potassium 3.6 3.4 - 4.5 mmol/L     Chloride 105 98 - 107 mmol/L     CO2 27 21 - 31 mmol/L     Comment: High lactate dehydrogenase (LDH) concentrations in patient samples may cause falsely  increased bicarbonate results. If markedly elevated LDH levels are suspected, please assess results in conjunction with patient's LDH values.      Anion Gap 5* 6 - 14 mmol/L     Glucose, Random 207* 70 - 99 mg/dL     Blood Urea Nitrogen (BUN) 27* 7 - 25 mg/dL     Creatinine 8.75 9.29 - 1.30 mg/dL     eGFR 80 >40 fO/fpw/8.26f7     Comment: GFR estimated by CKD-EPI equations(NKF 2021).   Recommend confirmation of Cr-based eGFR by using Cys-based eGFR and other filtration markers (if applicable) in complex cases and clinical decision-making, as needed.      Calcium 8.0* 8.6 - 10.3 mg/dL     BUN/Creatinine Ratio --    Comment: Creatinine is normal, ratio is not clinically indicated.      POC Glucose [[id number]]  (Abnormal) Collected: 01/13/24 0107   Lab Status: Final result Specimen: Blood from Capillary Updated: 01/13/24 0108    Glucose, POC 348* 70 - 99 mg/dL     Comment: Recheck/confirm     POC Glucose [[id number]]  (Abnormal) Collected: 01/12/24 2317   Lab Status: Final result Specimen: Blood from Capillary Updated: 01/12/24 2317    Glucose, POC 345* 70 - 99 mg/dL     Comment: Recheck/confirm     POC Glucose [[id number]]  (Abnormal) Collected: 01/12/24 2312   Lab Status: Final result Specimen: Blood from Capillary Updated: 01/12/24 2317    Glucose, POC 377* 70 - 99 mg/dL    POC Glucose [[id number]]  (Abnormal) Collected: 01/12/24 2205   Lab Status: Final result Specimen: Blood from Capillary Updated: 01/12/24 2205    Glucose, POC 366* 70 - 99 mg/dL     Comment: Recheck/confirm     Drug of Abuse 7 Panel [[id number]]  (Abnormal) Collected: 01/12/24 2128   Lab Status: Final result Specimen: Urine from Clean Catch Updated: 01/12/24 2147    Amphetamines Screen, Urine Negative Negative     Comment: Cutoff = 1000 ng/mL      Barbiturates Screen, Urine Negative Negative     Comment: Cutoff = 200 ng/mL      Benzodiazepines Screen, Urine Negative Negative     Comment: Cutoff = 200  ng/mL      Cocaine Screen, Urine Negative Negative     Comment: Cutoff = 300 ng/mL      Opiates Screen, Urine Negative Negative     Comment: Cutoff = 300 ng/mL      Fentanyl  Screen, Urine Negative Negative     Comment: Cutoff = 1 ng/mL      Marijuana (THC) Screen, Urine Positive* Negative     Comment: Cutoff = 50 ng/mL      Creatinine, Urine 27 >=20 mg/dL    Narrative:     These results are for medical  purposes only.  The screening procedure aims to detect the presence of drugs and it is regarded as presumptive (qualitative analysis).  Positive screening detection requires confirmation using more sensitive and specific quantitative methods, such as high performance liquid chromatography and mass spectrometry.   CBC with Differential [[id number]] Collected: 01/12/24 1944   Lab Status: Final result Specimen: Blood from Venous Updated: 01/12/24 1958   Narrative:     The following orders were created for panel order CBC with Differential. Procedure                               Abnormality         Status                    ---------                               -----------         ------                    CBC with Differential[[id number]]                           Final result               Please view results for these tests on the individual orders.   Comprehensive Metabolic Panel [[id number]]  (Abnormal) Collected: 01/12/24 1944   Lab Status: Final result Specimen: Blood from Venous Updated: 01/12/24 2031    Sodium 128* 136 - 145 mmol/L     Potassium 4.5 3.4 - 4.5 mmol/L     Chloride 91* 98 - 107 mmol/L     CO2 27 21 - 31 mmol/L     Comment: High lactate dehydrogenase (LDH) concentrations in patient samples may cause falsely increased bicarbonate results. If markedly elevated LDH levels are suspected, please assess results in conjunction with patient's LDH values.      Anion Gap 10 6 - 14 mmol/L     Glucose, Random 914* 70 - 99 mg/dL     Blood Urea Nitrogen (BUN) 31* 7 - 25 mg/dL      Creatinine 8.05* 9.29 - 1.30 mg/dL     eGFR 47* >40 fO/fpw/8.26f7     Comment: GFR estimated by CKD-EPI equations(NKF 2021).   Recommend confirmation of Cr-based eGFR by using Cys-based eGFR and other filtration markers (if applicable) in complex cases and clinical decision-making, as needed.      Albumin 3.3* 3.5 - 5.7 g/dL     Total Protein 5.7* 6.4 - 8.9 g/dL     Bilirubin, Total 0.4 0.3 - 1.0 mg/dL     Alkaline Phosphatase (ALP) 89 34 - 104 U/L     Aspartate Aminotransferase (AST) 15 13 - 39 U/L     Alanine Aminotransferase (ALT) 34 7 - 52 U/L     Calcium 9.0 8.6 - 10.3 mg/dL     BUN/Creatinine Ratio 16.0 10.0 - 20.0     Corrected Calcium 9.6 mg/dL     Comment: Reference Ranges Not Established. Recommend testing using ionized calcium.     Blood Gas Profile (Venous) [[id number]]  (Abnormal) Collected: 01/12/24 1944   Lab Status: Final result Specimen: Blood from Venous Updated: 01/12/24 2002   Narrative:     The following orders were created for panel order  Blood Gas Profile (Venous). Procedure                               Abnormality         Status                    ---------                               -----------         ------                    Blood Gas, [id number]]           Abnormal            Final result               Please view results for these tests on the individual orders.   CBC with Differential [[id number]] Collected: 01/12/24 1944   Lab Status: Final result Specimen: Blood from Venous Updated: 01/12/24 1958    WBC 10.63 4.40 - 11.00 10*3/uL     RBC 4.59 4.50 - 5.90 10*6/uL     Hemoglobin 14.1 14.0 - 17.5 g/dL     Hematocrit 57.6 58.4 - 50.4 %     Mean Corpuscular Volume (MCV) 92.3 80.0 - 96.0 fL     Mean Corpuscular Hemoglobin (MCH) 30.8 27.5 - 33.2 pg     Mean Corpuscular Hemoglobin Conc (MCHC) 33.3 33.0 - 37.0 g/dL     Red Cell Distribution Width (RDW) 13.6 12.3 - 17.0 %     Platelet Count (PLT) 260 150 - 450 10*3/uL     Mean Platelet  Volume (MPV) 8.5 6.8 - 10.2 fL     Neutrophils % 67 %     Lymphocytes % 26 %     Monocytes % 5 %     Eosinophils % 1 %     Basophils % 1 %     Neutrophils Absolute 7.10 1.80 - 7.80 10*3/uL     Lymphocytes # 2.80 1.00 - 4.80 10*3/uL     Monocytes # 0.60 0.00 - 0.80 10*3/uL     Eosinophils # 0.10 0.00 - 0.50 10*3/uL     Basophils # 0.10 0.00 - 0.20 10*3/uL    Blood Gas, Venous [[id number]]  (Abnormal) Collected: 01/12/24 1944   Lab Status: Final result Specimen: Blood from Venous Updated: 01/12/24 2002    pH, Blood Gas 7.356 7.320 - 7.420     pCO2, Venous 56.6 None Defined mmHg     pO2, Venous <30.1 None Defined mmHg     HCO3, Blood Gas 32* 21 - 28 mmol/L     Base Deficit (-) / Base Excess 6.2* -2.0 - 2.0 mmol/L     O2 Saturation, Venous (measured) 49.1* 70.0 - 80.0 %     Source, Blood Gas Venous   Hemoglobin A1C With Estimated Average Glucose [[id number]]  (Abnormal) Collected: 01/12/24 1944   Lab Status: Final result Specimen: Blood from Venous Updated: 01/12/24 2117    Hemoglobin A1c 14.8* <5.7 %     Comment: Normal A1c:             Less than 5.7%  Prediabetes range A1c:  5.7% to 6.4%  Diabetes range A1c:     Greater than 6.4%      Estimated Average Glucose 378* 70 -  154 mg/dL     Comment: The ADA has supported the calculation of Average Glucose (eAG) based on HbA1c measurements. However, the eAG reflects the average glycemic level over 2-3 months and it would not necessarily match single glucose laboratory measurements, nor reflect changes in the daily glucose concentration.     Magnesium  [[id number]]  (Normal) Collected: 01/12/24 1944   Lab Status: Final result Specimen: Blood from Venous Updated: 01/12/24 2103    Magnesium  2.2 1.9 - 2.7 mg/dL    POC Glucose [[id number]]  (Abnormal) Collected: 01/12/24 1918   Lab Status: Final result Specimen: Blood from Capillary Updated: 01/12/24 1918    Glucose, POC >600* 70 - 99 mg/dL     Comment: Recheck/confirm          C-SSRS  ([place] Suicide Severity Risk Scale and Protective Factors)   Past Month/Lifetime C-SSRS (Past Month/Lifetime) Screening - initial 1. Have you wished you were dead or wished you could go to sleep and not wake up? (Past 1 Month): No 2. Have you actually had any thoughts of killing yourself? (Past 1 Month): No 6. Have you ever done anything, started to do anything, or prepared to do anything to end your life? (Lifetime): No Calculated C-SSRS Risk Score (Lifetime/Recent): No Risk Indicated  Medications and Allergies   New Medications Ordered This Visit  Medications  . insulin  regular (HumuLIN  R, NovoLIN R) injection 10 Units  . sodium chloride  (bolus) 0.9 % bolus 1,000 mL  . sodium chloride  0.9 % infusion  . dextrose  (D50W) 50 % injection 12.5 g  . dextrose  (GLUTOSE) 40 % oral gel 15 g  . insulin  lispro (HumaLOG) injection 0-4 Units    BG Less Than 70:   Follow hypoglycemia instructions    BG 70 - 179:   No dose needed    BG 180 - 200:   1    BG 201 - 250:   2    BG 251 - 300:   3    BG 301 - 350:   3    BG 351 - 400:   4    BG Greater Than 400:   Call Provider  . acetaminophen  (TYLENOL ) tablet 650 mg  . ondansetron  (ZOFRAN ) injection 4 mg  . insulin  glargine (LANTUS ) injection 35 Units  . insulin  NPH-insulin  regular (HumuLIN  70/30, NovoLIN 70/30) 100 unit/mL (70-30) injection    Sig: Inject 35 Units under the skin 2 (two) times a day.    Allergies[1]   Past Medical History (per EMR)   Medical History[2]   Rayann Francisca Pedlar, [place] 01/13/2024 1:16 PM [facility] Department of Psychiatry & Behavioral Medicine        [1] Allergies Allergen Reactions  . Sulfamethoxazole-Trimethoprim Rash  [2] Past Medical History: Diagnosis Date  . ADHD (attention deficit hyperactivity disorder)   . Diabetes mellitus    Med Hx: Diabetes mellitus;   . Diabetic ketoacidosis    (CMD)   . Headache(784.0) 03/28/2011  . Sinusitis   *Some images could  not be shown.

## 2024-01-13 NOTE — ED Notes (Signed)
 Patient discharged with all belongings. IV removed without any complications. Patient verbalized understanding AVS and was able to teach back discharge instructions, medication instructions, and follow up appointments. Charge nurse provided patient with education on usage of CGM's prior to discharge. Patient verbalized understanding of teaching and states he is comfortable using CGM. Patient ambulatory in no distress and no complaints of pain at time of discharge. Patient ambulated off unit to meet family at patient entrance. Releasing care.    Wes Echevaria, RN 01/13/24 [phone]

## 2024-01-13 NOTE — Progress Notes (Signed)
 Case Management Discharge Note        CSN: [id number] DOB: 05-13-1995 Service: General Medicine Location: [phone]  Patient Class: Inpatient  DC Disposition: : Home or Self Care  Discharge DC Disposition: : Home or Self Care  Discharge Referrals Patient Preference: Chosen geographical local area/county shared with patient/family: Return/previous involvement Patient Preference for Post-Acute Provider Form completed: Return/Previous Involvement       Case Management Coordination Status: Coordination Complete     Phill Das, MSW

## 2024-01-13 NOTE — Progress Notes (Signed)
 [facility] provided resources for substance abuse in the patient's AVS.  Bascom FREDRIK Louder, [facility], LCAS-A, [facility] Behavioral Health Counselor [facility]

## 2024-01-13 NOTE — Progress Notes (Signed)
 Case Management Adult Assessment  CSN: [id number] DOB: 1996-02-12 Service: General Medicine Location: [phone]   Chart reviewed.  Pt is alert/oriented X4, independent with ADL/IADLs, resides significant other Rosina Sands, confirmed PCP, confirm insurance, medical transportation via friend, no income source, no active HH/DME/O2/DIALYSIS, medication compliant, active substance abuser choice cocaine, and discharge transportation via friend.  Pt request SA programs.    Info & Contacts Assessment Completed: In-person interview with Patient, Medical Record reviewed The patient's status at this time is:: Able to communicate Prior to admission, patient resided at: Private residence Prior to admission, patient lived with : Spouse/Significant Other The patient's decision maker is:: Patient At discharge, will patient return to prior residence: Yes Barriers to education: No barriers   Extended Emergency Contact Information Primary Emergency Contact: Lemon,[place]  [place] Home Phone: [phone] Mobile Phone: [phone] Relation: Significant Other  Assessment Is this patient at baseline?: No Was patient independent with ADLs prior to admission?: Yes Was patient independent with mobility prior to admission?: Yes Does this patient have or need any DME?: No Does this patient have or need any home health services?: No Does this patient have or need any personal care services?: No   Social Does patient have a mental health diagnosis?: No Does patient have an acute substance use issue?: Per medical record, Per patient report Per MEDICAL RECORD NUMBERCocaine/crack Per patient report: Cocaine/crack Has patient tried substance use counseling in the past: No Is patient currently receiving counseling services?: No Is patient currently interested in substance abuse counseling?: Yes Substance abuse counseling referral made: case management Is the patient on any medication assisted  treatment for substance abuse?: No   Discharge How will patient obtain prescription meds at discharge:: Medicaid Type of Payer Source:: Medicaid How will this patient obtain follow-up care after discharge?: PCP office How will this patient reach the discharge destination?: Family/Friends Is this a Chronic Dialysis patient?: No Patient Discharge Goals of Care: Increased Mobility/Self Care  Discharge Education Discharge Plan Discussed With:: Patient Education Readiness:: Acceptance Education Method:: Explanation Education Response:: Verbalizes Understanding                        Case Management Coordination Status: Coordination In-Progress    Anticipated Discharge Location: To be determined  Phill Das, MSW

## 2024-01-17 DIAGNOSIS — Z419 Encounter for procedure for purposes other than remedying health state, unspecified: Secondary | ICD-10-CM | POA: Diagnosis not present

## 2024-01-18 DIAGNOSIS — I10 Essential (primary) hypertension: Secondary | ICD-10-CM | POA: Diagnosis not present

## 2024-01-18 DIAGNOSIS — E109 Type 1 diabetes mellitus without complications: Secondary | ICD-10-CM | POA: Diagnosis not present

## 2024-05-27 ENCOUNTER — Ambulatory Visit: Payer: Self-pay | Admitting: Nurse Practitioner
